# Patient Record
Sex: Male | Born: 1950 | Race: White | Hispanic: No | Marital: Married | Smoking: Former smoker
Health system: Southern US, Community
[De-identification: ages and names within clinical notes are randomized; demographics above are authoritative.]

## PROBLEM LIST (undated history)

## (undated) DIAGNOSIS — E785 Hyperlipidemia, unspecified: Secondary | ICD-10-CM

## (undated) DIAGNOSIS — I440 Atrioventricular block, first degree: Secondary | ICD-10-CM

## (undated) DIAGNOSIS — T7840XA Allergy, unspecified, initial encounter: Secondary | ICD-10-CM

## (undated) DIAGNOSIS — G4752 REM sleep behavior disorder: Secondary | ICD-10-CM

## (undated) DIAGNOSIS — F1910 Other psychoactive substance abuse, uncomplicated: Secondary | ICD-10-CM

## (undated) DIAGNOSIS — I10 Essential (primary) hypertension: Secondary | ICD-10-CM

## (undated) HISTORY — DX: REM sleep behavior disorder: G47.52

## (undated) HISTORY — DX: Hyperlipidemia, unspecified: E78.5

## (undated) HISTORY — DX: Other psychoactive substance abuse, uncomplicated: F19.10

## (undated) HISTORY — PX: VASECTOMY: SHX75

## (undated) HISTORY — DX: Allergy, unspecified, initial encounter: T78.40XA

## (undated) HISTORY — DX: Atrioventricular block, first degree: I44.0

## (undated) HISTORY — DX: Essential (primary) hypertension: I10

---

## 1998-05-17 ENCOUNTER — Encounter: Admission: RE | Admit: 1998-05-17 | Discharge: 1998-05-17 | Payer: Self-pay | Admitting: Sports Medicine

## 1999-11-21 ENCOUNTER — Encounter: Admission: RE | Admit: 1999-11-21 | Discharge: 1999-11-21 | Payer: Self-pay | Admitting: Family Medicine

## 1999-11-24 ENCOUNTER — Encounter: Admission: RE | Admit: 1999-11-24 | Discharge: 1999-11-24 | Payer: Self-pay | Admitting: Family Medicine

## 1999-12-26 ENCOUNTER — Ambulatory Visit (HOSPITAL_COMMUNITY): Admission: RE | Admit: 1999-12-26 | Discharge: 1999-12-26 | Payer: Self-pay | Admitting: Sports Medicine

## 2000-03-28 ENCOUNTER — Encounter: Admission: RE | Admit: 2000-03-28 | Discharge: 2000-03-28 | Payer: Self-pay | Admitting: Family Medicine

## 2000-05-29 ENCOUNTER — Encounter: Admission: RE | Admit: 2000-05-29 | Discharge: 2000-05-29 | Payer: Self-pay | Admitting: Sports Medicine

## 2000-07-30 ENCOUNTER — Encounter: Admission: RE | Admit: 2000-07-30 | Discharge: 2000-07-30 | Payer: Self-pay | Admitting: Family Medicine

## 2000-08-21 ENCOUNTER — Ambulatory Visit (HOSPITAL_COMMUNITY): Admission: RE | Admit: 2000-08-21 | Discharge: 2000-08-21 | Payer: Self-pay | Admitting: Otolaryngology

## 2000-08-21 ENCOUNTER — Encounter: Payer: Self-pay | Admitting: Otolaryngology

## 2000-12-24 ENCOUNTER — Encounter: Admission: RE | Admit: 2000-12-24 | Discharge: 2000-12-24 | Payer: Self-pay | Admitting: Sports Medicine

## 2001-01-29 ENCOUNTER — Encounter: Admission: RE | Admit: 2001-01-29 | Discharge: 2001-01-29 | Payer: Self-pay | Admitting: Sports Medicine

## 2001-06-20 ENCOUNTER — Encounter: Admission: RE | Admit: 2001-06-20 | Discharge: 2001-06-20 | Payer: Self-pay | Admitting: Family Medicine

## 2001-07-25 ENCOUNTER — Encounter: Admission: RE | Admit: 2001-07-25 | Discharge: 2001-07-25 | Payer: Self-pay | Admitting: Sports Medicine

## 2001-08-06 ENCOUNTER — Encounter: Admission: RE | Admit: 2001-08-06 | Discharge: 2001-08-06 | Payer: Self-pay | Admitting: Family Medicine

## 2002-01-08 ENCOUNTER — Ambulatory Visit (HOSPITAL_COMMUNITY): Admission: RE | Admit: 2002-01-08 | Discharge: 2002-01-08 | Payer: Self-pay | Admitting: Gastroenterology

## 2002-01-08 ENCOUNTER — Encounter (INDEPENDENT_AMBULATORY_CARE_PROVIDER_SITE_OTHER): Payer: Self-pay | Admitting: *Deleted

## 2002-02-04 ENCOUNTER — Encounter: Admission: RE | Admit: 2002-02-04 | Discharge: 2002-02-04 | Payer: Self-pay | Admitting: Family Medicine

## 2002-08-26 ENCOUNTER — Encounter: Admission: RE | Admit: 2002-08-26 | Discharge: 2002-08-26 | Payer: Self-pay | Admitting: Sports Medicine

## 2002-09-15 ENCOUNTER — Encounter: Admission: RE | Admit: 2002-09-15 | Discharge: 2002-09-15 | Payer: Self-pay | Admitting: Family Medicine

## 2003-03-10 ENCOUNTER — Encounter: Admission: RE | Admit: 2003-03-10 | Discharge: 2003-03-10 | Payer: Self-pay | Admitting: Family Medicine

## 2003-03-18 ENCOUNTER — Encounter: Payer: Self-pay | Admitting: Sports Medicine

## 2003-03-18 ENCOUNTER — Encounter: Admission: RE | Admit: 2003-03-18 | Discharge: 2003-03-18 | Payer: Self-pay | Admitting: Sports Medicine

## 2003-04-03 ENCOUNTER — Encounter: Admission: RE | Admit: 2003-04-03 | Discharge: 2003-04-03 | Payer: Self-pay | Admitting: Sports Medicine

## 2004-11-03 ENCOUNTER — Ambulatory Visit: Payer: Self-pay | Admitting: Sports Medicine

## 2004-12-20 ENCOUNTER — Ambulatory Visit (HOSPITAL_COMMUNITY): Admission: RE | Admit: 2004-12-20 | Discharge: 2004-12-20 | Payer: Self-pay | Admitting: Sports Medicine

## 2004-12-20 ENCOUNTER — Ambulatory Visit: Payer: Self-pay | Admitting: Sports Medicine

## 2005-03-31 ENCOUNTER — Ambulatory Visit: Payer: Self-pay | Admitting: Sports Medicine

## 2005-08-18 ENCOUNTER — Encounter: Admission: RE | Admit: 2005-08-18 | Discharge: 2005-08-18 | Payer: Self-pay | Admitting: Allergy and Immunology

## 2006-02-22 ENCOUNTER — Ambulatory Visit: Payer: Self-pay | Admitting: Sports Medicine

## 2006-09-06 ENCOUNTER — Ambulatory Visit: Payer: Self-pay | Admitting: Sports Medicine

## 2006-09-06 DIAGNOSIS — J45990 Exercise induced bronchospasm: Secondary | ICD-10-CM | POA: Insufficient documentation

## 2006-09-06 DIAGNOSIS — I10 Essential (primary) hypertension: Secondary | ICD-10-CM

## 2006-09-06 DIAGNOSIS — E785 Hyperlipidemia, unspecified: Secondary | ICD-10-CM

## 2006-09-06 DIAGNOSIS — D126 Benign neoplasm of colon, unspecified: Secondary | ICD-10-CM

## 2006-09-06 LAB — CONVERTED CEMR LAB: Cholesterol: 191 mg/dL (ref 0–200)

## 2006-09-09 ENCOUNTER — Ambulatory Visit: Payer: Self-pay | Admitting: Cardiology

## 2006-09-19 ENCOUNTER — Encounter: Payer: Self-pay | Admitting: Cardiology

## 2006-09-19 ENCOUNTER — Ambulatory Visit (HOSPITAL_COMMUNITY): Admission: RE | Admit: 2006-09-19 | Discharge: 2006-09-19 | Payer: Self-pay | Admitting: Sports Medicine

## 2006-11-29 ENCOUNTER — Ambulatory Visit: Payer: Self-pay | Admitting: Sports Medicine

## 2006-11-29 DIAGNOSIS — I517 Cardiomegaly: Secondary | ICD-10-CM | POA: Insufficient documentation

## 2006-12-10 ENCOUNTER — Telehealth: Payer: Self-pay | Admitting: Sports Medicine

## 2007-01-04 ENCOUNTER — Telehealth: Payer: Self-pay | Admitting: *Deleted

## 2007-01-08 ENCOUNTER — Encounter: Payer: Self-pay | Admitting: *Deleted

## 2007-03-07 ENCOUNTER — Encounter: Payer: Self-pay | Admitting: Sports Medicine

## 2007-12-06 ENCOUNTER — Encounter: Payer: Self-pay | Admitting: Sports Medicine

## 2008-01-30 ENCOUNTER — Ambulatory Visit: Payer: Self-pay | Admitting: Sports Medicine

## 2008-01-30 DIAGNOSIS — F528 Other sexual dysfunction not due to a substance or known physiological condition: Secondary | ICD-10-CM

## 2008-01-30 LAB — CONVERTED CEMR LAB
ALT: 23 units/L (ref 0–53)
AST: 18 units/L (ref 0–37)
Albumin: 4.6 g/dL (ref 3.5–5.2)
Alkaline Phosphatase: 87 units/L (ref 39–117)
BUN: 14 mg/dL (ref 6–23)
CO2: 24 meq/L (ref 19–32)
Calcium: 9.6 mg/dL (ref 8.4–10.5)
Chloride: 100 meq/L (ref 96–112)
Cholesterol: 200 mg/dL (ref 0–200)
Creatinine, Ser: 0.99 mg/dL (ref 0.40–1.50)
Glucose, Bld: 96 mg/dL (ref 70–99)
HDL: 70 mg/dL (ref >39)
LDL Cholesterol: 114 mg/dL — ABNORMAL HIGH (ref 0–99)
PSA: 0.75 ng/mL (ref 0.10–4.00)
Potassium: 4.2 meq/L (ref 3.5–5.3)
Sodium: 136 meq/L (ref 135–145)
Total Bilirubin: 0.4 mg/dL (ref 0.3–1.2)
Total CHOL/HDL Ratio: 2.9
Total Protein: 7.1 g/dL (ref 6.0–8.3)
Triglycerides: 81 mg/dL (ref <150)
VLDL: 16 mg/dL (ref 0–40)

## 2008-02-03 ENCOUNTER — Telehealth: Payer: Self-pay | Admitting: *Deleted

## 2008-02-27 ENCOUNTER — Ambulatory Visit: Payer: Self-pay | Admitting: Sports Medicine

## 2008-02-27 DIAGNOSIS — D485 Neoplasm of uncertain behavior of skin: Secondary | ICD-10-CM

## 2008-03-05 ENCOUNTER — Encounter (INDEPENDENT_AMBULATORY_CARE_PROVIDER_SITE_OTHER): Payer: Self-pay | Admitting: *Deleted

## 2008-06-26 ENCOUNTER — Ambulatory Visit: Payer: Self-pay | Admitting: Sports Medicine

## 2008-06-26 DIAGNOSIS — G44209 Tension-type headache, unspecified, not intractable: Secondary | ICD-10-CM | POA: Insufficient documentation

## 2008-06-26 DIAGNOSIS — K219 Gastro-esophageal reflux disease without esophagitis: Secondary | ICD-10-CM

## 2009-02-01 ENCOUNTER — Ambulatory Visit: Payer: Self-pay | Admitting: Sports Medicine

## 2009-02-01 DIAGNOSIS — M542 Cervicalgia: Secondary | ICD-10-CM

## 2009-02-01 DIAGNOSIS — S86819A Strain of other muscle(s) and tendon(s) at lower leg level, unspecified leg, initial encounter: Secondary | ICD-10-CM

## 2009-02-01 DIAGNOSIS — S838X9A Sprain of other specified parts of unspecified knee, initial encounter: Secondary | ICD-10-CM | POA: Insufficient documentation

## 2009-06-14 ENCOUNTER — Ambulatory Visit: Payer: Self-pay | Admitting: Family Medicine

## 2009-06-14 ENCOUNTER — Ambulatory Visit: Payer: Self-pay | Admitting: Sports Medicine

## 2009-06-14 LAB — CONVERTED CEMR LAB
ALT: 31 units/L (ref 0–53)
AST: 25 units/L (ref 0–37)
Albumin: 4.3 g/dL (ref 3.5–5.2)
Alkaline Phosphatase: 62 units/L (ref 39–117)
BUN: 19 mg/dL (ref 6–23)
CO2: 25 meq/L (ref 19–32)
Calcium: 9.2 mg/dL (ref 8.4–10.5)
Chloride: 105 meq/L (ref 96–112)
Cholesterol: 146 mg/dL (ref 0–200)
Creatinine, Ser: 1.03 mg/dL (ref 0.40–1.50)
Glucose, Bld: 98 mg/dL (ref 70–99)
HDL: 54 mg/dL (ref >39)
LDL Cholesterol: 82 mg/dL (ref 0–99)
PSA: 0.88 ng/mL (ref 0.10–4.00)
Potassium: 4.3 meq/L (ref 3.5–5.3)
Sodium: 142 meq/L (ref 135–145)
Total Bilirubin: 0.6 mg/dL (ref 0.3–1.2)
Total CHOL/HDL Ratio: 2.7
Total Protein: 6.7 g/dL (ref 6.0–8.3)
Triglycerides: 49 mg/dL (ref <150)
VLDL: 10 mg/dL (ref 0–40)

## 2010-06-08 ENCOUNTER — Encounter (INDEPENDENT_AMBULATORY_CARE_PROVIDER_SITE_OTHER): Payer: Self-pay | Admitting: *Deleted

## 2010-06-27 ENCOUNTER — Encounter: Payer: Self-pay | Admitting: Sports Medicine

## 2010-06-27 ENCOUNTER — Ambulatory Visit: Payer: Self-pay | Admitting: Family Medicine

## 2010-06-27 LAB — CONVERTED CEMR LAB
ALT: 34 units/L (ref 0–53)
AST: 26 units/L (ref 0–37)
Albumin: 4.3 g/dL (ref 3.5–5.2)
Alkaline Phosphatase: 72 units/L (ref 39–117)
BUN: 21 mg/dL (ref 6–23)
CO2: 25 meq/L (ref 19–32)
Calcium: 9.6 mg/dL (ref 8.4–10.5)
Chloride: 103 meq/L (ref 96–112)
Cholesterol: 166 mg/dL (ref 0–200)
Creatinine, Ser: 1.1 mg/dL (ref 0.40–1.50)
Glucose, Bld: 115 mg/dL — ABNORMAL HIGH (ref 70–99)
HCT: 43.1 % (ref 39.0–52.0)
HDL: 47 mg/dL (ref >39)
Hemoglobin: 14.7 g/dL (ref 13.0–17.0)
LDL Cholesterol: 105 mg/dL — ABNORMAL HIGH (ref 0–99)
MCHC: 34.1 g/dL (ref 30.0–36.0)
MCV: 88.3 fL (ref 78.0–100.0)
PSA: 1.18 ng/mL (ref <=4.00)
Platelets: 254 10*3/uL (ref 150–400)
Potassium: 4.1 meq/L (ref 3.5–5.3)
RBC: 4.88 M/uL (ref 4.22–5.81)
RDW: 12.9 % (ref 11.5–15.5)
Sodium: 139 meq/L (ref 135–145)
Total Bilirubin: 0.6 mg/dL (ref 0.3–1.2)
Total CHOL/HDL Ratio: 3.5
Total Protein: 6.3 g/dL (ref 6.0–8.3)
Triglycerides: 71 mg/dL (ref <150)
VLDL: 14 mg/dL (ref 0–40)
WBC: 6.5 10*3/uL (ref 4.0–10.5)

## 2010-06-29 ENCOUNTER — Encounter (INDEPENDENT_AMBULATORY_CARE_PROVIDER_SITE_OTHER): Payer: Self-pay | Admitting: *Deleted

## 2010-06-30 ENCOUNTER — Ambulatory Visit: Payer: Self-pay | Admitting: Sports Medicine

## 2010-06-30 ENCOUNTER — Ambulatory Visit: Payer: Self-pay | Admitting: Family Medicine

## 2010-06-30 DIAGNOSIS — R7303 Prediabetes: Secondary | ICD-10-CM | POA: Insufficient documentation

## 2010-06-30 DIAGNOSIS — R7301 Impaired fasting glucose: Secondary | ICD-10-CM

## 2010-08-08 ENCOUNTER — Encounter (INDEPENDENT_AMBULATORY_CARE_PROVIDER_SITE_OTHER): Payer: Self-pay | Admitting: *Deleted

## 2010-08-10 DIAGNOSIS — E7800 Pure hypercholesterolemia, unspecified: Secondary | ICD-10-CM | POA: Insufficient documentation

## 2010-08-10 DIAGNOSIS — F1120 Opioid dependence, uncomplicated: Secondary | ICD-10-CM | POA: Insufficient documentation

## 2010-08-11 NOTE — Miscellaneous (Signed)
Summary: LABS FOR CPE IN DEC  Clinical Lists Changes  Orders: Added new Test order of Miscellaneous Lab Charge-[facility] [phone]) - Signed

## 2010-08-11 NOTE — Letter (Signed)
Summary: New Patient letter  [facility]  [address] [place], [place] [address]   Phone: [phone]  Fax: [phone]       06/29/2010 MRN: [id number]  Clarence Ware [address] [place], [place]  [address]  Dear Mr. NOLL,  Welcome to the Gastroenterology Division at [facility]    You are scheduled to see Dr.  Jarold Motto on 08-09-10 at 2:00p.m. on the 3rd floor at [facility], [address]  We ask that you try to arrive at our office 15 minutes prior to your appointment time to allow for check-in.  We would like you to complete the enclosed self-administered evaluation form prior to your visit and bring it with you on the day of your appointment.  We will review it with you.  Also, please bring a complete list of all your medications or, if you prefer, bring the medication bottles and we will list them.  Please bring your insurance card so that we may make a copy of it.  If your insurance requires a referral to see a specialist, please bring your referral form from your primary care physician.  Co-payments are due at the time of your visit and may be paid by cash, check or credit card.     Your office visit will consist of a consult with your physician (includes a physical exam), any laboratory testing he/she may order, scheduling of any necessary diagnostic testing (e.g. x-ray, ultrasound, CT-scan), and scheduling of a procedure (e.g. Endoscopy, Colonoscopy) if required.  Please allow enough time on your schedule to allow for any/all of these possibilities.    If you cannot keep your appointment, please call [phone] to cancel or reschedule prior to your appointment date.  This allows [place] the opportunity to schedule an appointment for another patient in need of care.  If you do not cancel or reschedule by 5 p.m. the business day prior to your appointment date, you will be charged a $50.00 late cancellation/no-show fee.    Thank you for  choosing [facility] for your medical needs.  We appreciate the opportunity to care for you.  Please visit [place] at our website  to learn more about our practice.                     Sincerely,                                                             The Gastroenterology Division

## 2010-08-11 NOTE — Assessment & Plan Note (Signed)
Summary: CPE,MC   Vital Signs:  Patient profile:   60 year old male Height:      75 inches Weight:      212 pounds BMI:     26.59 BP sitting:   126 / 86  Vitals Entered By: Lillia Pauls CMA (June 30, 2010 12:34 PM)   History of Present Illness: Substance abuse program goes to group twice monthly goes for 2 urine tests per month charges seved and can do probation program before license  HBP has been stable w systolics in 130 range/ diastolics 84 stable with metoprolol and HCTZ as noted - no real side effects  Cholesterol still on simvistatin 80 some muscle aches and fatigue - not sure if related  asa one once daily  cialis 20 mgm - this works for ED  Preventive Screening-Counseling & Management  Alcohol-Tobacco     Smoking Status: never  Allergies (verified): No Known Drug Allergies  Past History:  Past Medical History: 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 Josefa Half  mild diverticulosis - 01/26/2002 -no medical flares since then   ETT 2001 12 minutes of bruce - 11/03/2004, ett 98 12 mins of bruce protocal    vasectomy 1988  Family History: brother CABG 2001 doing OK was obese Ron   brother died with neuroblastoma at 60 -Annette Stable   Brother  twin brother Thayer Ohm Brother with HBP and chol/ AODM age 70 - doing better and has lost weight had dissecting aorta   male sib 73 and healthy   father died MI age 17  mother died age 28 MI died with that  Social History: pharmacist in town   lives with wife   2 sons Jonny Ruiz at [employer]  rob at [employer] works [employer] as well   non smoker;  formerly drank but no ETOH or illegal drugs in 2 years  - sobriety date 1//09/2008  Review of Systems  The patient denies vision loss,  hoarseness, syncope, dyspnea on exertion, abdominal pain, and melena.         Occ central chest tightenss - thought 2/2 anxiety is brief irregular bowel fxn - 3 x per week  Physical Exam  General:  Well-developed,well-nourished,in no acute distress; alert,appropriate and cooperative throughout examination Head:  Normocephalic and atraumatic without obvious abnormalities. No apparent alopecia or balding. Eyes:  vitreous line RT small floater left nl retina Ears:  External ear exam shows no significant lesions or deformities.  Otoscopic examination reveals clear canals, tympanic membranes are intact bilaterally without bulging, retraction, inflammation or discharge. Hearing is grossly normal bilaterally. Nose:  External nasal examination shows no deformity or inflammation. Nasal mucosa are pink and moist without lesions or exudates. Mouth:  Oral mucosa and oropharynx without lesions or exudates.  Teeth in good repair. Neck:  No deformities, masses, or tenderness noted. Chest Wall:  No deformities, masses, tenderness or gynecomastia noted. Lungs:  Normal respiratory effort, chest expands symmetrically. Lungs are clear to auscultation, no crackles or wheezes. Heart:  Normal rate and regular rhythm. S1 and S2 normal without gallop, murmur, click, rub or other extra sounds. Abdomen:  Bowel sounds positive,abdomen soft and non-tender without masses, organomegaly or hernias noted. Rectal:  No external abnormalities noted. Normal sphincter tone. No rectal  masses or tenderness.  guiac neg Prostate:  no gland enlargement.  med and lat lobes smooth base is slightly nodular on palpation but no discreet nodule noted Msk:  RT knee shows some mild chronic DJD change medial compt with mild spurring good flexion lacks 2 to 3 deg of full extension  left knee wnl  bilat shoulders norm elbows normal ankles normal Pulses:  R and L carotid,radial,femoral,dorsalis pedis and posterior tibial pulses are  full and equal bilaterally Extremities:  No clubbing, cyanosis, edema, or deformity noted with normal full range of motion of all joints.   Neurologic:  alert & oriented X3 and cranial nerves II-XII intact.   Skin:  Intact without suspicious lesions or rashes Cervical Nodes:  No lymphadenopathy noted Axillary Nodes:  No palpable lymphadenopathy Inguinal Nodes:  No significant adenopathy Psych:  memory intact for recent and remote and normally interactive.     Impression & Recommendations:  Problem # 1:  HEALTH SCREENING (ICD-V70.0) The physical evaluation today looks good  HTN is stable on meds Hyperlipidemia is well contorlled - although we want to swtch him off the 80 of simvistatin to lipotor 20 generic suggested eye  exam particularly if any  sxs occur colon polyps needs colonsocopy q 5 yrs  Very strong fam hx of CVD and MI - now 5 yrs and I would like to repeat ETT this year  Problem # 2:  IMPAIRED FASTING GLUCOSE (ICD-790.21) 2 levels in borderline range  suggested dietary change  we will reck FBG and A1C after diet change for 2 to 3 mos  also will set him up with regular medical care at [facility]  Complete Medication List: 1)  Cialis 5 Mg Tabs (Tadalafil) .Marland Kitchen.. 1 by mouth as directed 2)  Metoprolol Tartrate 50 Mg Tabs (Metoprolol tartrate) .... 1/ 2 tab by mouth once daily 3)  Hydrochlorothiazide 25 Mg Tabs (Hydrochlorothiazide) .... 1/2 tab by mouth once daily 4)  Cvs Aspirin Ec 325 Mg Tbec (Aspirin) .... One qd 5)  Lamisil Advanced 1 % Gel (Terbinafine) .... Apply daily to toes as directed disp: 80g 6)  Lipitor 20 Mg Tabs (Atorvastatin calcium) .... Use 1 by mouth qhs  Patient Instructions: 1)  Encourage 5 fruit servings per day and some gatorade w goal of getting to at leat 5 BMs per week 2)  vegetables - more leafy, greens, etc are better 3)  DASH - review some of this and see if this fits your diets Prescriptions: LIPITOR 20 MG TABS (ATORVASTATIN CALCIUM) use 1 by  mouth qhs  #30 x 11   Entered and Authorized by:   Enid Baas MD   Signed by:   Enid Baas MD on 06/30/2010   Method used:   Electronically to        [facility] (retail)       [address]       [place], [place]  [address]       Ph: [phone]       Fax: [phone]   RxID:   [id number]    Orders Added: 1)  Est. Patient 40-64 years [id number]

## 2010-08-17 NOTE — Miscellaneous (Signed)
  Clinical Lists Changes  Medications: Added new medication of LIPITOR 40 MG TABS (ATORVASTATIN CALCIUM) take 1 by mouth at bedtime - Signed Rx of LIPITOR 40 MG TABS (ATORVASTATIN CALCIUM) take 1 by mouth at bedtime;  #30 x 6;  Signed;  Entered by: Rochele Pages RN;  Authorized by: Enid Baas MD;  Method used: Electronically to [facility] [address] [place], [place], [place]  [address], Ph: [phone], Fax: [phone]    Prescriptions: LIPITOR 40 MG TABS (ATORVASTATIN CALCIUM) take 1 by mouth at bedtime  #30 x 6   Entered by:   Rochele Pages RN   Authorized by:   Enid Baas MD   Signed by:   Rochele Pages RN on 08/08/2010   Method used:   Electronically to        [facility] (retail)       [address]       [place], [place]  [address]       Ph: [phone]       Fax: [phone]   RxID:   [id number]  Pt requested 40 mg tabs so he could cut in half.  Ok per Dr. Darrick Penna.  Advised pt to make sure to cut tab in half even though instructions say take 1 at bedtime. Rochele Pages RN  August 08, 2010 9:23 AM

## 2010-09-19 LAB — GLUCOSE, CAPILLARY: Glucose-Capillary: 114 mg/dL — ABNORMAL HIGH (ref 70–99)

## 2010-11-25 NOTE — Procedures (Signed)
[facility]  Patient:    Clarence Ware, Clarence Ware Visit Number: [id number] MRN: [id number]          Service Type: END Location: ENDO Attending Physician:  Rich Brave Dictated by:   Florencia Reasons, M.D. Proc. Date: 01/08/02 Admit Date:  01/08/2002   CC:         Royal Hawthorn B. Darrick Penna, M.D.   Procedure Report  PROCEDURE:  Colonoscopy with polypectomy and not biopsy.  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  FINDINGS:  Several polyps removed, largest approximately 18 mm.  DESCRIPTION OF PROCEDURE:  The nature, purpose, and risks of the procedure had been carefully discussed with the patient, who provided written consent. Sedation was fentanyl 100 mcg and Versed 10 mg IV prior to and during the course of this rather lengthy procedure.  Digital exam of the prostate was normal.  The Olympus adjustable-tension pediatric video colonoscope was advanced with some looping, overcome by external abdominal compression and having the patient in the supine position, to the cecum.  There were quite a few sharp angulations and flexures in the proximal colon, making it appear that we had reached the cecum prior to our actually doing so.  Once in the cecum, pullback was performed.  In what I believe was the transverse colon, there was an 18 mm pedunculated polyp removed by snare technique.  There was some initial oozing at the stalk, so the stalk remnant was regrasped with the polypectomy snare and cinched up for half a minute or so to help achieve complete hemostasis.  The estimated blood loss was perhaps 1 or 2 cc.  After holding the stalk for half a minute or so, we loosened the snare, came up higher on the stalk remnant, and very gradually snared off a small piece, although I do not think that was ever retrieved for histologic  analysis.  The main part of the polyp head was withdrawn along with the scope out through the anus and sent for histologic analysis.  We then rescoped the patient up to the site of the polypectomy and injected approximately 2 cc of 1:10,000 epinephrine with good bleb formation and good blanching, to help ensure hemostasis.  Pullback was then performed.  The patient had a 5 mm sessile polyp removed by snare technique at about 35 cm and a couple of sessile hyperplastic-appearing 2-3 mm polyps in the rectum, one of which was hot biopsied, and another of which was cold biopsied a couple of times.  Retroflexion in the rectum prior to the polypectomies was unremarkable.  There was some mild sigmoid diverticulosis.  No cancer, colitis, or vascular malformations were observed.  The quality of the prep was quite good, so it was felt that all areas were well-seen.  IMPRESSION: 1. Several colon polyps removed as described above.  Transient small-volume    bleeding with one of the polypectomies, treated as described above. 2. Mild sigmoid diverticulosis.  PLAN:  Await pathology.  Postpolypectomy instructions, specifically aspirin avoidance and instruction to call us in the event of rectal bleeding. Dictated by:   Florencia Reasons, M.D. Attending Physician:  Rich Brave DD:  01/08/02 TD:  01/10/02 Job: [id number]

## 2010-12-04 ENCOUNTER — Other Ambulatory Visit: Payer: Self-pay | Admitting: Sports Medicine

## 2011-01-31 ENCOUNTER — Other Ambulatory Visit: Payer: Self-pay | Admitting: Sports Medicine

## 2011-06-22 ENCOUNTER — Ambulatory Visit: Payer: Self-pay

## 2011-06-26 ENCOUNTER — Ambulatory Visit: Payer: Self-pay

## 2011-07-21 ENCOUNTER — Ambulatory Visit: Payer: Self-pay

## 2011-09-24 ENCOUNTER — Other Ambulatory Visit: Payer: Self-pay | Admitting: Sports Medicine

## 2011-09-25 ENCOUNTER — Other Ambulatory Visit: Payer: Self-pay | Admitting: *Deleted

## 2011-09-25 MED ORDER — TADALAFIL 20 MG PO TABS: ORAL_TABLET | ORAL | Status: DC

## 2011-10-03 ENCOUNTER — Ambulatory Visit (INDEPENDENT_AMBULATORY_CARE_PROVIDER_SITE_OTHER): Payer: BC Managed Care – PPO | Admitting: Emergency Medicine

## 2011-10-03 VITALS — BP 128/88 | HR 61 | Temp 97.6°F | Resp 16 | Ht 75.0 in | Wt 220.4 lb

## 2011-10-03 DIAGNOSIS — I10 Essential (primary) hypertension: Secondary | ICD-10-CM

## 2011-10-03 DIAGNOSIS — Z0000 Encounter for general adult medical examination without abnormal findings: Secondary | ICD-10-CM

## 2011-10-03 DIAGNOSIS — H539 Unspecified visual disturbance: Secondary | ICD-10-CM

## 2011-10-03 DIAGNOSIS — N529 Male erectile dysfunction, unspecified: Secondary | ICD-10-CM

## 2011-10-03 DIAGNOSIS — E782 Mixed hyperlipidemia: Secondary | ICD-10-CM

## 2011-10-03 DIAGNOSIS — E785 Hyperlipidemia, unspecified: Secondary | ICD-10-CM

## 2011-10-03 LAB — CBC
Platelets: 218 10*3/uL (ref 150–400)
RBC: 5.23 MIL/uL (ref 4.22–5.81)
RDW: 12.6 % (ref 11.5–15.5)
WBC: 7.9 10*3/uL (ref 4.0–10.5)

## 2011-10-03 LAB — COMPREHENSIVE METABOLIC PANEL
AST: 25 U/L (ref 0–37)
Alkaline Phosphatase: 76 U/L (ref 39–117)
BUN: 17 mg/dL (ref 6–23)
Creat: 0.98 mg/dL (ref 0.50–1.35)
Total Bilirubin: 0.7 mg/dL (ref 0.3–1.2)

## 2011-10-03 LAB — POCT URINALYSIS DIPSTICK
Protein, UA: NEGATIVE
Spec Grav, UA: 1.015
Urobilinogen, UA: 0.2

## 2011-10-03 LAB — POCT UA - MICROSCOPIC ONLY
Bacteria, U Microscopic: NEGATIVE
Casts, Ur, LPF, POC: NEGATIVE
Crystals, Ur, HPF, POC: NEGATIVE

## 2011-10-03 LAB — LIPID PANEL
Cholesterol: 165 mg/dL (ref 0–200)
Total CHOL/HDL Ratio: 3.8 Ratio
Triglycerides: 70 mg/dL (ref <150)
VLDL: 14 mg/dL (ref 0–40)

## 2011-10-03 LAB — IFOBT (OCCULT BLOOD): IFOBT: NEGATIVE

## 2011-10-03 MED ORDER — HYDROCHLOROTHIAZIDE 25 MG PO TABS: 25.0000 mg | ORAL_TABLET | Freq: Every morning | ORAL | Status: DC

## 2011-10-03 MED ORDER — ASPIRIN BUFFERED 325 MG PO TABS: 325.0000 mg | ORAL_TABLET | Freq: Every day | ORAL | Status: DC

## 2011-10-03 MED ORDER — ATORVASTATIN CALCIUM 40 MG PO TABS: 40.0000 mg | ORAL_TABLET | Freq: Once | ORAL | Status: DC

## 2011-10-03 MED ORDER — LORATADINE 10 MG PO TABS: 10.0000 mg | ORAL_TABLET | Freq: Every day | ORAL | Status: DC

## 2011-10-03 MED ORDER — RANITIDINE HCL 150 MG PO CAPS: 150.0000 mg | ORAL_CAPSULE | Freq: Two times a day (BID) | ORAL | Status: DC

## 2011-10-03 MED ORDER — IBUPROFEN 200 MG PO TABS: 200.0000 mg | ORAL_TABLET | Freq: Four times a day (QID) | ORAL | Status: AC | PRN

## 2011-10-03 MED ORDER — TETANUS-DIPHTH-ACELL PERTUSSIS 5-2.5-18.5 LF-MCG/0.5 IM SUSP: 0.5000 mL | Freq: Once | INTRAMUSCULAR | Status: DC

## 2011-10-03 MED ORDER — TADALAFIL 20 MG PO TABS: ORAL_TABLET | ORAL | Status: DC

## 2011-10-03 MED ORDER — METOPROLOL TARTRATE 50 MG PO TABS: ORAL_TABLET | ORAL | Status: DC

## 2011-10-03 NOTE — Progress Notes (Signed)
Subjective:    Patient ID: Clarence Ware, male    DOB: 12/30/1950, 61 y.o.   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 His only ongoing problems or high blood pressure high cholesterol. He is a long-standing friend of mine and approximately 3 years ago head is license revoked for his substance abuse issue. He goes to regular meetings and gets regular counseling for this problem. He has drug testing done one to 2 times a month and has for the last 3 years. He is trying to get his license back and is waiting for a hearing for reinstatement. He overall is staying active he now has a regular job.    Review of Systems  Constitutional: Negative.   HENT: Negative.   Eyes:       Recently he has had some diminished vision in his right eye. In fact this has been going on for about 8 months. He just assumed he was developing a cataract.  Respiratory: Negative.   Cardiovascular:       He is asymptomatic from a cardiac standpoint but does have a bad family history of heart disease and has had intermittent stress test but none for the last 3 to 5 years.  Genitourinary:       He does have the ED issues and takes Cialis for this  Musculoskeletal: Negative.   Skin:       Has 2 warts on the right side of his face he would like frozen today. He's also had some nail issues but is not willing to take Lamisil for this.  Neurological: Negative.   Hematological: Negative.   Psychiatric/Behavioral: Negative.        Objective:   Physical Exam  Constitutional: He appears well-developed and well-nourished.  HENT:  Head: Normocephalic and atraumatic.  Right Ear: External ear normal.  Left Ear: External ear normal.  Eyes: Pupils are equal, round, and reactive to light.       The left disc appeared normal the rest disc I could not get a good visualization. It did not appear that he had a cataract. I was concerned that there was some possible  clouding  of the vitreous.  Cardiovascular: Normal rate, regular rhythm and normal heart sounds.   Pulmonary/Chest: Effort normal. No respiratory distress. He has no wheezes. He has no rales.  Abdominal: He exhibits no distension and no mass. There is no tenderness. There is no rebound and no guarding.  Genitourinary: Prostate normal.  Musculoskeletal: Normal range of motion.  Neurological: He is alert. No cranial nerve deficit. Coordination normal.  Skin: Skin is warm and dry.  Psychiatric: He has a normal mood and affect. His behavior is normal.   The 2 areas on his right cheek were treated with liquid nitrogen for about 6-8 seconds x2 each.       Assessment & Plan:   Patient has a couple issues that need to be addressed. #1 he has had a decreased visual acuity in the right eye which needs to see the ophthalmologist regarding this. He is past due for colonoscopy and will check on his insurance reimbursement for this. The last problem it is that he has not had a recent stress test and has a bad family history of coronary disease. He was advised he needs a stress Cardiolite and will let me know when he is willing to have this done. I am concerned about the vision is right I am  not sure what this represents. I very much encouraged him to let me know and we'll get him in to see the ophthalmologist

## 2011-10-05 ENCOUNTER — Encounter: Payer: Self-pay | Admitting: Radiology

## 2011-10-31 ENCOUNTER — Encounter: Payer: Self-pay | Admitting: Emergency Medicine

## 2012-09-24 ENCOUNTER — Encounter: Payer: Self-pay | Admitting: Emergency Medicine

## 2012-09-24 ENCOUNTER — Ambulatory Visit (INDEPENDENT_AMBULATORY_CARE_PROVIDER_SITE_OTHER): Payer: BC Managed Care – PPO | Admitting: Emergency Medicine

## 2012-09-24 VITALS — BP 110/82 | HR 80 | Temp 98.4°F | Resp 18 | Wt 220.0 lb

## 2012-09-24 DIAGNOSIS — K4090 Unilateral inguinal hernia, without obstruction or gangrene, not specified as recurrent: Secondary | ICD-10-CM

## 2012-09-24 LAB — POCT CBC
Granulocyte percent: 67.1 %G (ref 37–80)
HCT, POC: 47.8 % (ref 43.5–53.7)
Hemoglobin: 15.8 g/dL (ref 14.1–18.1)
MCH, POC: 30.3 pg (ref 27–31.2)
MCV: 91.7 fL (ref 80–97)
Platelet Count, POC: 236 10*3/uL (ref 142–424)
RBC: 5.21 M/uL (ref 4.69–6.13)
WBC: 8.5 10*3/uL (ref 4.6–10.2)

## 2012-09-24 LAB — POCT URINALYSIS DIPSTICK
Blood, UA: NEGATIVE
Leukocytes, UA: NEGATIVE
Nitrite, UA: NEGATIVE
Protein, UA: NEGATIVE
pH, UA: 6.5

## 2012-09-24 LAB — POCT UA - MICROSCOPIC ONLY
Bacteria, U Microscopic: NEGATIVE
Epithelial cells, urine per micros: NEGATIVE

## 2012-09-24 LAB — LIPID PANEL
Cholesterol: 150 mg/dL (ref 0–200)
VLDL: 20 mg/dL (ref 0–40)

## 2012-09-24 LAB — COMPREHENSIVE METABOLIC PANEL
Albumin: 4.7 g/dL (ref 3.5–5.2)
Alkaline Phosphatase: 86 U/L (ref 39–117)
Total Protein: 7 g/dL (ref 6.0–8.3)

## 2012-09-24 MED ORDER — METOPROLOL TARTRATE 50 MG PO TABS: ORAL_TABLET | ORAL | Status: DC

## 2012-09-24 MED ORDER — ATORVASTATIN CALCIUM 40 MG PO TABS: 40.0000 mg | ORAL_TABLET | Freq: Once | ORAL | Status: DC

## 2012-09-24 MED ORDER — HYDROCHLOROTHIAZIDE 25 MG PO TABS: 12.5000 mg | ORAL_TABLET | Freq: Every morning | ORAL | Status: DC

## 2012-09-24 MED ORDER — IBUPROFEN 200 MG PO TABS: 200.0000 mg | ORAL_TABLET | Freq: Four times a day (QID) | ORAL | Status: DC | PRN

## 2012-09-24 MED ORDER — ASPIRIN BUFFERED 325 MG PO TABS: 325.0000 mg | ORAL_TABLET | Freq: Every day | ORAL | Status: AC

## 2012-09-24 MED ORDER — TADALAFIL 20 MG PO TABS: ORAL_TABLET | ORAL | Status: DC

## 2012-09-24 NOTE — Progress Notes (Signed)
@[facility] @  Patient ID: Clarence Ware MRN: [id number], DOB: 1951/03/28 62 y.o. Date of Encounter: 09/24/2012, 5:10 PM  Primary Physician: Enid Baas, MD  Chief Complaint: Physical (CPE)  HPI: 62 y.o. y/o male with history noted below here for CPE.  Doing well. No issues/complaints.  Review of Systems: Consitutional: No fever, chills, fatigue, night sweats, lymphadenopathy, or weight changes. Eyes: No visual changes, eye redness, or discharge. ENT/Mouth: Ears: No otalgia, tinnitus, hearing loss, discharge. Nose: No congestion, rhinorrhea, sinus pain, or epistaxis. Throat: No sore throat, post nasal drip, or teeth pain. Cardiovascular: No CP, palpitations, diaphoresis, DOE, edema, orthopnea, PND. Respiratory: No cough, hemoptysis, SOB, or wheezing. Gastrointestinal: No anorexia, dysphagia, reflux, pain, nausea, vomiting, hematemesis, diarrhea, constipation, BRBPR, or melena. Genitourinary: No dysuria, frequency, urgency, hematuria, incontinence, nocturia, decreased urinary stream, discharge, impotence, or testicular pain/masses. Musculoskeletal: No decreased ROM, myalgias, stiffness, joint swelling, or weakness. He has had some pain in his right knee and plans to see Dr. fears regarding this. Skin: No rash, erythema, lesion changes, pain, warmth, jaundice, or pruritis. Neurological: No headache, dizziness, syncope, seizures, tremors, memory loss, coordination problems, or paresthesias. Psychological: No anxiety, depression, hallucinations, SI/HI. Endocrine: No fatigue, polydipsia, polyphagia, polyuria, or known diabetes. All other systems were reviewed and are otherwise negative.  Past Medical History  Diagnosis Date  . Allergy   . Substance abuse   . Hyperlipidemia   . Hypertension      Past Surgical History  Procedure Laterality Date  . Vasectomy      Home Meds:  Prior to Admission medications   Medication Sig Start Date End Date Taking? Authorizing Provider  aspirin  325 MG buffered tablet Take 1 tablet (325 mg total) by mouth daily. 10/03/11 10/02/12 Yes Collene Gobble, MD  atorvastatin (LIPITOR) 40 MG tablet Take 1 tablet (40 mg total) by mouth once. 10/03/11  Yes Collene Gobble, MD  hydrochlorothiazide (HYDRODIURIL) 25 MG tablet Take 12.5 mg by mouth every morning. 10/03/11  Yes Collene Gobble, MD  loratadine (CLARITIN) 10 MG tablet Take 1 tablet (10 mg total) by mouth daily. 10/03/11  Yes Collene Gobble, MD  metoprolol (LOPRESSOR) 50 MG tablet Take one half tablet daily 10/03/11  Yes Collene Gobble, MD  tadalafil (CIALIS) 20 MG tablet Use as directed. 10/03/11  Yes Collene Gobble, MD    Allergies: No Known Allergies  History   Social History  . Marital Status: Married    Spouse Name: N/A    Number of Children: N/A  . Years of Education: N/A   Occupational History  . Not on file.   Social History Main Topics  . Smoking status: Former Games developer  . Smokeless tobacco: Not on file  . Alcohol Use: No  . Drug Use: No  . Sexually Active: Yes   Other Topics Concern  . Not on file   Social History Narrative  . No narrative on file    Family History  Problem Relation Age of Onset  . Heart disease Mother   . Heart disease Father   . Hypertension Brother   . Diabetes Brother   . Cancer Brother     Physical Exam:  Blood pressure 110/82, pulse 80, temperature 98.4 F (36.9 C), temperature source Oral, resp. rate 18, weight 220 lb (99.791 kg).  General: Well developed, well nourished, in no acute distress. HEENT: Normocephalic, atraumatic. Conjunctiva pink, sclera non-icteric. Pupils 2 mm constricting to 1 mm, round, regular, and equally reactive to light and accomodation. EOMI.  Internal auditory canal clear. TMs with good cone of light and without pathology. Nasal mucosa pink. Nares are without discharge. No sinus tenderness. Oral mucosa pink. Dentition . Pharynx without exudate.   Neck: Supple. Trachea midline. No thyromegaly. Full ROM. No  lymphadenopathy. Lungs: Clear to auscultation bilaterally without wheezes, rales, or rhonchi. Breathing is of normal effort and unlabored. Cardiovascular: RRR with S1 S2. No murmurs, rubs, or gallops appreciated. Distal pulses 2+ symmetrically. No carotid or abdominal bruits.  Abdomen: Soft, non-tender, non-distended with normoactive bowel sounds. No hepatosplenomegaly or masses. No rebound/guarding. No CVA tenderness. Without hernias.  Rectal: No external hemorrhoids or fissures. Rectal vault without masses.   Genitourinary:   circumcised male. No penile lesions. Testes descended bilaterally, and smooth without tenderness or masses.  Musculoskeletal: Full range of motion and 5/5 strength throughout. Without swelling, atrophy, tenderness, crepitus, or warmth. Extremities without clubbing, cyanosis, or edema. Calves supple. Skin: Warm and moist without erythema, ecchymosis, wounds, or rash. Neuro: A+Ox3. CN II-XII grossly intact. Moves all extremities spontaneously. Full sensation throughout. Normal gait. DTR 2+ throughout upper and lower extremities. Finger to nose intact. Psych:  Responds to questions appropriately with a normal affect.   Results for orders placed in visit on 09/24/12  POCT CBC      Result Value Range   WBC 8.5  4.6 - 10.2 K/uL   Lymph, poc 2.1  0.6 - 3.4   POC LYMPH PERCENT 24.2  10 - 50 %L   MID (cbc) 0.7  0 - 0.9   POC MID % 8.7  0 - 12 %M   POC Granulocyte 5.7  2 - 6.9   Granulocyte percent 67.1  37 - 80 %G   RBC 5.21  4.69 - 6.13 M/uL   Hemoglobin 15.8  14.1 - 18.1 g/dL   HCT, POC 16.1  09.6 - 53.7 %   MCV 91.7  80 - 97 fL   MCH, POC 30.3  27 - 31.2 pg   MCHC 33.0  31.8 - 35.4 g/dL   RDW, POC 04.5     Platelet Count, POC 236  142 - 424 K/uL   MPV 8.1  0 - 99.8 fL  IFOBT (OCCULT BLOOD)      Result Value Range   IFOBT Negative    POCT UA - MICROSCOPIC ONLY      Result Value Range   WBC, Ur, HPF, POC 0-1     RBC, urine, microscopic 0-2     Bacteria, U  Microscopic neg     Mucus, UA neg     Epithelial cells, urine per micros neg     Crystals, Ur, HPF, POC neg     Casts, Ur, LPF, POC neg     Yeast, UA neg    POCT URINALYSIS DIPSTICK      Result Value Range   Color, UA yellow     Clarity, UA clear     Glucose, UA neg     Bilirubin, UA neg     Ketones, UA neg     Spec Grav, UA 1.020     Blood, UA neg     pH, UA 6.5     Protein, UA neg     Urobilinogen, UA 0.2     Nitrite, UA neg     Leukocytes, UA Negative     Studies: CBC, CMET, Lipid, PSA, TSH,.     Assessment/Plan:  62 y.o. y/o white male here for his yearly physical exam. He is behind on  his colonoscopies and states he will call directly to get this scheduled once he checks on his insurance. He has a right inguinal hernia and will make referral to Dr. Wenda Low for repair of this. It is 5 years since he had cardiovascular evaluation and this needs to be performed again. He had a 1 cm wart on his right thumb which was treated with cry all first 7 seconds x3 . * -  Signed, Earl Lites, MD 09/24/2012 5:10 PM

## 2012-11-07 ENCOUNTER — Ambulatory Visit (INDEPENDENT_AMBULATORY_CARE_PROVIDER_SITE_OTHER): Payer: Self-pay | Admitting: Surgery

## 2012-11-11 ENCOUNTER — Other Ambulatory Visit (HOSPITAL_COMMUNITY): Payer: Self-pay | Admitting: Cardiovascular Disease

## 2012-11-11 DIAGNOSIS — I059 Rheumatic mitral valve disease, unspecified: Secondary | ICD-10-CM

## 2012-11-29 ENCOUNTER — Ambulatory Visit (INDEPENDENT_AMBULATORY_CARE_PROVIDER_SITE_OTHER): Payer: BC Managed Care – PPO | Admitting: Surgery

## 2012-12-09 ENCOUNTER — Encounter (INDEPENDENT_AMBULATORY_CARE_PROVIDER_SITE_OTHER): Payer: BC Managed Care – PPO

## 2012-12-09 ENCOUNTER — Ambulatory Visit (HOSPITAL_COMMUNITY)
Admission: RE | Admit: 2012-12-09 | Discharge: 2012-12-09 | Disposition: A | Discharge status: Home / Self Care | Payer: BC Managed Care – PPO | Source: Ambulatory Visit | Attending: Cardiovascular Disease | Admitting: Cardiovascular Disease

## 2012-12-09 DIAGNOSIS — I079 Rheumatic tricuspid valve disease, unspecified: Secondary | ICD-10-CM | POA: Insufficient documentation

## 2012-12-09 DIAGNOSIS — I10 Essential (primary) hypertension: Secondary | ICD-10-CM | POA: Insufficient documentation

## 2012-12-09 DIAGNOSIS — I059 Rheumatic mitral valve disease, unspecified: Secondary | ICD-10-CM

## 2012-12-09 DIAGNOSIS — Z8249 Family history of ischemic heart disease and other diseases of the circulatory system: Secondary | ICD-10-CM | POA: Insufficient documentation

## 2012-12-09 DIAGNOSIS — E785 Hyperlipidemia, unspecified: Secondary | ICD-10-CM | POA: Insufficient documentation

## 2012-12-09 LAB — PULMONARY FUNCTION TEST

## 2012-12-09 NOTE — Progress Notes (Signed)
2D Echo Performed 12/09/2012    Tzipporah Nagorski, RCS  

## 2012-12-12 ENCOUNTER — Encounter: Payer: Self-pay | Admitting: Cardiovascular Disease

## 2012-12-17 ENCOUNTER — Encounter: Payer: Self-pay | Admitting: Cardiovascular Disease

## 2012-12-17 ENCOUNTER — Ambulatory Visit (INDEPENDENT_AMBULATORY_CARE_PROVIDER_SITE_OTHER): Payer: BC Managed Care – PPO | Admitting: Cardiovascular Disease

## 2012-12-17 VITALS — BP 120/72 | HR 64 | Ht 75.0 in | Wt 221.5 lb

## 2012-12-17 DIAGNOSIS — I119 Hypertensive heart disease without heart failure: Secondary | ICD-10-CM

## 2012-12-17 DIAGNOSIS — E785 Hyperlipidemia, unspecified: Secondary | ICD-10-CM

## 2012-12-17 MED ORDER — LOSARTAN POTASSIUM 50 MG PO TABS: 50.0000 mg | ORAL_TABLET | Freq: Every day | ORAL | Status: DC

## 2012-12-17 NOTE — Patient Instructions (Signed)
Your physician has recommended you make the following change in your medication: Stop your HCTZ. Start losartan 50mg . Increase your atorvastatin to 40 mg.   Your physician recommends that you return for lab work in ( B-MET 2 WEEKS).      NMR in 2 months.  Your physician recommends that you schedule a follow-up appointment in: 2-3 months.

## 2012-12-27 ENCOUNTER — Telehealth: Payer: Self-pay

## 2012-12-27 NOTE — Telephone Encounter (Signed)
Printed it is at front desk for pick up.

## 2012-12-27 NOTE — Telephone Encounter (Signed)
Pt would like a copy of his immunization record. Best# [id number]

## 2012-12-30 ENCOUNTER — Telehealth: Payer: Self-pay | Admitting: Cardiovascular Disease

## 2012-12-30 NOTE — Telephone Encounter (Signed)
Wants know if he can go to any [facility] lab?

## 2012-12-30 NOTE — Telephone Encounter (Signed)
Returned call and informed pt he can go to any [facility] lab.  Pt verbalized understanding and agreed w/ plan.

## 2013-01-02 ENCOUNTER — Encounter: Payer: Self-pay | Admitting: Cardiovascular Disease

## 2013-01-02 NOTE — Progress Notes (Signed)
Patient ID: Clarence Ware, male   DOB: 1950-12-29, 62 y.o.   MRN: [id number]     HPI: Clarence Ware, is a 62 y.o. male who I saw for initial cardiology evaluation on 11/07/2012 through the courtesy of Dr. Doppler. 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 He has a history of hyperlipidemia and hypertension. Possibly 7 years ago he had a normal routine treadmill test. The patient does exercise regularly. There is a prior history of substance abuse for which he did undergo treatment. He does also have a history of hyperlipidemia, erectile dysfunction, a history of intermittent headaches, and also possible right inguinal hernia for which he is to be seen by Dr. Wenda Low. When I saw him on 11/07/2012, we discussed the sensitivity and specificity of routine treadmill testing with a strong family history I elected to schedule him for a cardiopulmonary neck test for further evaluation would also be helpful to assess endothelial dysfunction/diastolic dysfunction evaluate potential for microvascular angina. He also had a soft cardiac murmur at his apex and recommended a 2-D echo Doppler study.  Clarence Ware is cardiopulmonary neck test to be old good functional capacity with an excellent maximum oxygen consumption peak of 92% of predicted. However, he did have a abnormal response during the last 7 minutes of exercise suggesting ischemic myocardial dysfunction with decreasing stroke volume with increasing work rate which corresponded to an abrupt decrease in cardiac output suggestive of exercise-induced myocardial dysfunction. He did have very mild ventilation/perfusion mismatch with reference to his pulmonary status. A 2-D echo Doppler study showed normal systolic function with an ejection fraction of 55-60%. He had normal diastolic  parameters. Presents to the office today for followup evaluation.  Past Medical History  Diagnosis Date  . Allergy   . Substance abuse   . Hyperlipidemia   . Hypertension     Past Surgical History  Procedure Laterality Date  . Vasectomy      No Known Allergies  Current Outpatient Prescriptions  Medication Sig Dispense Refill  . aspirin 325 MG buffered tablet Take 1 tablet (325 mg total) by mouth daily.  100 tablet  3  . atorvastatin (LIPITOR) 40 MG tablet Take 20 mg by mouth once.      . hydrochlorothiazide (HYDRODIURIL) 25 MG tablet Take 0.5 tablets (12.5 mg total) by mouth every morning.  90 tablet  3  . ibuprofen (ADVIL) 200 MG tablet Take 1 tablet (200 mg total) by mouth every 6 (six) hours as needed for pain.  100 tablet  3  . loratadine (CLARITIN) 10 MG tablet Take 1 tablet (10 mg total) by mouth daily.  30 tablet  11  . metoprolol (LOPRESSOR) 50 MG tablet Take one half tablet daily  90 tablet  3  . tadalafil (CIALIS) 20 MG tablet Use as directed.  6 tablet  11  . losartan (COZAAR) 50 MG tablet Take 1 tablet (50 mg total) by mouth daily.  90 tablet  3   No current facility-administered medications for this visit.    Socially he is married has 2 children no grandchildren. He does exercise at least 30 minutes at a time. There is a prior history of tobacco use but he quit over 3 years ago. Has a prior use of opiates.  ROS is negative for fevers, chills or night sweats. He denies PND  or orthopnea. He denies chest pain. He does note some occasional joint discomfort. There are no visual symptoms. He denies indigestion or GI symptoms. There is no edema. He denies neurologic symptoms.  Other system review is negative.  PE BP 120/72  Pulse 64  Ht 6\' 3"  (1.905 m)  Wt 221 lb 8 oz (100.472 kg)  BMI 27.69 kg/m2  General: Alert, oriented, no distress.  Skin: normal turgor, no rashes HEENT: Normocephalic, atraumatic. Pupils round and reactive; sclera anicteric;no lid lag.  Nose  without nasal septal hypertrophy Mouth/Parynx benign; Mallinpatti scale 2 Neck: No JVD, no carotid briuts Lungs: clear to ausculatation and percussion; no wheezing or rales Heart: RRR, s1 s2 normal 1/6 apical systolic murmur Abdomen: soft, nontender; no hepatosplenomehaly, BS+; abdominal aorta nontender and not dilated by palpation. Pulses 2+ Extremities: no clubbing cyanosis or edema, Homan's sign negative  Neurologic: grossly nonfocal    LABS:  BMET    Component Value Date/Time   NA 140 09/24/2012 1640   K 4.0 09/24/2012 1640   CL 101 09/24/2012 1640   CO2 31 09/24/2012 1640   GLUCOSE 96 09/24/2012 1640   BUN 22 09/24/2012 1640   CREATININE 1.00 09/24/2012 1640   CREATININE 1.10 06/27/2010 2008   CALCIUM 9.8 09/24/2012 1640     Hepatic Function Panel     Component Value Date/Time   PROT 7.0 09/24/2012 1640   ALBUMIN 4.7 09/24/2012 1640   AST 20 09/24/2012 1640   ALT 23 09/24/2012 1640   ALKPHOS 86 09/24/2012 1640   BILITOT 0.8 09/24/2012 1640     CBC    Component Value Date/Time   WBC 8.5 09/24/2012 1645   WBC 7.9 10/03/2011 1614   RBC 5.21 09/24/2012 1645   RBC 5.23 10/03/2011 1614   HGB 15.8 09/24/2012 1645   HGB 15.9 10/03/2011 1614   HCT 47.8 09/24/2012 1645   HCT 45.9 10/03/2011 1614   PLT 218 10/03/2011 1614   MCV 91.7 09/24/2012 1645   MCV 87.8 10/03/2011 1614   MCH 30.3 09/24/2012 1645   MCH 30.4 10/03/2011 1614   MCHC 33.0 09/24/2012 1645   MCHC 34.6 10/03/2011 1614   RDW 12.6 10/03/2011 1614     BNP No results found for this basename: probnp    Lipid Panel     Component Value Date/Time   CHOL 150 09/24/2012 1640   TRIG 102 09/24/2012 1640   HDL 43 09/24/2012 1640   CHOLHDL 3.5 09/24/2012 1640   VLDL 20 09/24/2012 1640   LDLCALC 87 09/24/2012 1640     RADIOLOGY: No results found.    ASSESSMENT AND PLAN: I long discussion with Mr. Shannahan. We spent considerable time discussing his cardiopulmonary that test he did have an abnormal cardiovascular response  with decreasing stroke volume associated with increasing work rate and abrupt decrease in cardiac output in the last 7 minutes of exercise. He did have an excellent functional status, however with a maximal oxygen consumption of 92% of predicted presently, I recommended he discontinue his hydrochlorothiazide since he's not having edema for blood pressure control. I believe he would benefit from ARB therapy in light of his cardiopulmonary neck findings and will start him on postoperative 50 mg daily. Also further titrating his atorvastatin 40 mg for more aggressive treatment. In 2 months he'll undergo an NMR lipoprofile for optimal assessment of LDL particle number with reference to his lipid therapy. I will see him in 3 months for followup evaluation.     Lennette Bihari,  MD, [facility]  01/02/2013 9:51 AM

## 2013-01-03 ENCOUNTER — Encounter (INDEPENDENT_AMBULATORY_CARE_PROVIDER_SITE_OTHER): Payer: Self-pay | Admitting: Surgery

## 2013-01-03 ENCOUNTER — Ambulatory Visit (INDEPENDENT_AMBULATORY_CARE_PROVIDER_SITE_OTHER): Payer: BC Managed Care – PPO | Admitting: Surgery

## 2013-01-03 VITALS — BP 120/72 | HR 68 | Temp 98.7°F | Resp 16 | Ht 75.0 in | Wt 223.2 lb

## 2013-01-03 DIAGNOSIS — K4090 Unilateral inguinal hernia, without obstruction or gangrene, not specified as recurrent: Secondary | ICD-10-CM

## 2013-01-03 NOTE — Progress Notes (Signed)
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 He has had this hernia for greater than a year and it has not bothered him. He has not had any periods of obstruction.  Physical exam confirms a right inguinal hernia that is reducible, soft, nontender. No left inguinal hernia noted. Testes without masses.  Past Medical History  Diagnosis Date  . Allergy   . Substance abuse   . Hyperlipidemia   . Hypertension     Past Surgical History  Procedure Laterality Date  . Vasectomy      Current Outpatient Prescriptions  Medication Sig Dispense Refill  . aspirin 325 MG buffered tablet Take 1 tablet (325 mg total) by mouth daily.  100 tablet  3  . atorvastatin (LIPITOR) 40 MG tablet Take 20 mg by mouth once.      Marland Kitchen ibuprofen (ADVIL) 200 MG tablet Take 1 tablet (200 mg total) by mouth every 6 (six) hours as needed for pain.  100 tablet  3  . loratadine (CLARITIN) 10 MG tablet Take 1 tablet (10 mg total) by mouth daily.  30 tablet  11  . losartan (COZAAR) 50 MG tablet Take 1 tablet (50 mg total) by mouth daily.  90 tablet  3  . metoprolol (LOPRESSOR) 50 MG tablet Take one half tablet daily  90 tablet  3  . tadalafil (CIALIS) 20 MG tablet Use as directed.  6 tablet  11   No current facility-administered medications for this visit.   Review of patient's allergies indicates no known allergies. Family History  Problem Relation Age of Onset  . Heart disease Mother   . Heart disease Father   . Hypertension Brother   . Diabetes Brother   . Cancer Brother    Social History:   reports that he quit smoking about 20 years ago. He does not have any smokeless tobacco history on file. He reports that he does not drink alcohol or use illicit drugs.   REVIEW OF SYSTEMS - PERTINENT POSITIVES ONLY: noncontributory  Physical Exam:   Blood  pressure 120/72, pulse 68, temperature 98.7 F (37.1 C), temperature source Temporal, resp. rate 16, height 6\' 3"  (1.905 m), weight 223 lb 3.2 oz (101.243 kg). Body mass index is 27.9 kg/(m^2).  Gen:  WDWN white male NAD  Neurological: Alert and oriented to person, place, and time. Motor and sensory function is grossly intact  Head: Normocephalic and atraumatic.  Eyes: Conjunctivae are normal. Pupils are equal, round, and reactive to light. No scleral icterus.   Abdomen:  Right inguinal hernia as noted above  LABORATORY RESULTS: No results found for this or any previous visit (from the past 48 hour(s)).  RADIOLOGY RESULTS: No results found.  Problem List: Patient Active Problem List   Diagnosis Date Noted  . Inguinal hernia, right 01/03/2013  . IMPAIRED FASTING GLUCOSE 06/30/2010  . NECK PAIN 02/01/2009  . SPRAIN&STRAIN OTHER SPECIFIED SITES KNEE&LEG 02/01/2009  . TENSION TYPE HEADACHE UNSPECIFIED 06/26/2008  . GERD 06/26/2008  . NEOPLASM OF UNCERTAIN BEHAVIOR OF SKIN 02/27/2008  . ERECTILE DYSFUNCTION, MILD 01/30/2008  . VENTRICULAR HYPERTROPHY, LEFT 11/29/2006  . COLON POLYP 09/06/2006  . HYPERLIPIDEMIA 09/06/2006  . HYPERTENSION, BENIGN SYSTEMIC 09/06/2006  . ASTHMA, EXERCISE INDUCED 09/06/2006    Assessment & Plan: Right inguinal hernia, asymptomatic, present for greater than one year He  is very comfortable observing this. We discussed what to look for should it become incarcerated. When he decides and if he decides to have this repaired and we could do this as an outpatient and using open technique.    Matt B. Daphine Deutscher, MD, [facility] [phone] beeper [phone]  01/03/2013 9:57 AM

## 2013-01-03 NOTE — Patient Instructions (Signed)

## 2013-03-03 LAB — BASIC METABOLIC PANEL
CO2: 26 mEq/L (ref 19–32)
Chloride: 105 mEq/L (ref 96–112)
Creat: 1.03 mg/dL (ref 0.50–1.35)

## 2013-03-04 LAB — NMR LIPOPROFILE WITH LIPIDS
HDL Particle Number: 29.1 umol/L — ABNORMAL LOW (ref >=30.5)
HDL-C: 46 mg/dL (ref >=40)
LDL Size: 21.1 nm (ref >20.5)
Large HDL-P: 5.4 umol/L (ref >=4.8)
Triglycerides: 58 mg/dL (ref <150)

## 2013-03-05 ENCOUNTER — Other Ambulatory Visit: Payer: Self-pay | Admitting: Gastroenterology

## 2013-03-07 ENCOUNTER — Encounter: Payer: Self-pay | Admitting: Cardiology

## 2013-03-07 DIAGNOSIS — Z8249 Family history of ischemic heart disease and other diseases of the circulatory system: Secondary | ICD-10-CM

## 2013-03-07 DIAGNOSIS — Z87891 Personal history of nicotine dependence: Secondary | ICD-10-CM

## 2013-03-13 ENCOUNTER — Other Ambulatory Visit: Payer: Self-pay | Admitting: *Deleted

## 2013-03-13 ENCOUNTER — Encounter: Payer: Self-pay | Admitting: *Deleted

## 2013-03-13 MED ORDER — ATORVASTATIN CALCIUM 80 MG PO TABS: 80.0000 mg | ORAL_TABLET | Freq: Every day | ORAL | Status: DC

## 2013-03-13 NOTE — Progress Notes (Signed)
Quick Note:  Lab note and atorvastatin 80 mg prescription sent to patient. ______

## 2013-03-14 ENCOUNTER — Encounter: Payer: Self-pay | Admitting: Cardiovascular Disease

## 2013-03-14 ENCOUNTER — Ambulatory Visit (INDEPENDENT_AMBULATORY_CARE_PROVIDER_SITE_OTHER): Payer: BC Managed Care – PPO | Admitting: Cardiovascular Disease

## 2013-03-14 VITALS — BP 120/80 | HR 56 | Ht 75.0 in | Wt 223.2 lb

## 2013-03-14 DIAGNOSIS — E785 Hyperlipidemia, unspecified: Secondary | ICD-10-CM

## 2013-03-14 DIAGNOSIS — Z8249 Family history of ischemic heart disease and other diseases of the circulatory system: Secondary | ICD-10-CM

## 2013-03-14 DIAGNOSIS — I119 Hypertensive heart disease without heart failure: Secondary | ICD-10-CM

## 2013-03-14 DIAGNOSIS — I440 Atrioventricular block, first degree: Secondary | ICD-10-CM

## 2013-03-14 MED ORDER — EZETIMIBE 10 MG PO TABS: 10.0000 mg | ORAL_TABLET | Freq: Every day | ORAL | Status: DC

## 2013-03-14 NOTE — Patient Instructions (Addendum)
Your physician has recommended you make the following change in your medication: start new RX for zetia  Results for orders placed in visit on 12/17/12  BASIC METABOLIC PANEL      Result Value Range   Sodium 139  135 - 145 mEq/L   Potassium 4.1  3.5 - 5.3 mEq/L   Chloride 105  96 - 112 mEq/L   CO2 26  19 - 32 mEq/L   Glucose, Bld 100 (*) 70 - 99 mg/dL   BUN 19  6 - 23 mg/dL   Creat 1.61  0.96 - 0.45 mg/dL   Calcium 9.2  8.4 - 40.9 mg/dL  NMR LIPOPROFILE WITH LIPIDS      Result Value Range   LDL Particle Number [id number] (*) <1000 nmol/L   LDL (calc) 86  <100 mg/dL   HDL-C 46  >=81 mg/dL   Triglycerides 58  <191 mg/dL   Cholesterol, Total 478  <200 mg/dL   HDL Particle Number 29.5 (*) >=30.5 umol/L   Large HDL-P 5.4  >=4.8 umol/L   Large VLDL-P 1.2  <=2.7 nmol/L   Small LDL Particle Number 552 (*) <=527 nmol/L   LDL Size 21.1  >20.5 nm   HDL Size 9.0 (*) >=9.2 nm   VLDL Size 42.0  <=46.6 nm   LP-IR Score 33  <=45

## 2013-03-25 ENCOUNTER — Telehealth: Payer: Self-pay | Admitting: Cardiovascular Disease

## 2013-03-25 NOTE — Telephone Encounter (Signed)
Calling in reference to a letter that he received from [place].

## 2013-03-25 NOTE — Telephone Encounter (Signed)
Returned call.  Pt stated he received the letter from Dr. Tresa Endo with his cholesterol results.  Stated it says to increase Lipitor to 80 mg, but that's not what Dr. Tresa Endo told him at his last appt.  Stated he was told to take Zetia.  Reviewed chart.  Pt informed letter was mailed prior to his appt w/ Dr. Tresa Endo on 9.5.14 and advised he follow instructions given at visit on 9.5.14.  Pt verbalized understanding and agreed w/ plan.  Med list updated: Atorvastatin 40 mg daily

## 2013-03-26 ENCOUNTER — Encounter (HOSPITAL_COMMUNITY): Payer: Self-pay | Admitting: *Deleted

## 2013-03-30 ENCOUNTER — Encounter: Payer: Self-pay | Admitting: Cardiovascular Disease

## 2013-03-30 DIAGNOSIS — I440 Atrioventricular block, first degree: Secondary | ICD-10-CM | POA: Insufficient documentation

## 2013-03-30 NOTE — Progress Notes (Signed)
Patient ID: Clarence Ware, male   DOB: 10/20/1950, 62 y.o.   MRN: [id number]     HPI: Clarence Ware, is a 62 y.o. male presents to the office today for followup cardiology evaluation. I initially saw him on 11/07/2012 through the courtesy of Dr. Cleta Alberts.  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 He has a history of hyperlipidemia and hypertension. Approximately 7 years ago he had a normal routine treadmill test. The patient has continued to exercise regularly. There is a prior history of substance abuse for which he did undergo treatment. He does also have a history of hyperlipidemia, erectile dysfunction, a history of intermittent headaches, and also possible right inguinal hernia for which he is to be seen by Dr. Wenda Low. When I saw him on 11/07/2012, we discussed the sensitivity and specificity of routine treadmill testing with a strong family history I elected to schedule him for a cardiopulmonary neck test for further evaluation would also be helpful to assess endothelial dysfunction/diastolic dysfunction evaluate potential for microvascular angina. He also had a soft cardiac murmur at his apex and recommended a 2-D echo Doppler study.  Clarence Ware underwent a cardiopulmonary met test and had good functional capacity with an excellent maximum oxygen consumption peak of 92% of predicted. However, he did have a abnormal response during the last several minutes of exercise suggesting ischemic myocardial dysfunction with decreasing stroke volume with increasing work rate which corresponded to an abrupt decrease in cardiac output suggestive of exercise-induced myocardial dysfunction. He did have very mild ventilation/perfusion mismatch with reference to his pulmonary status. A 2-D echo Doppler study showed normal systolic function with an  ejection fraction of 55-60%. He had normal diastolic parameters.  I saw him in followup of his noninvasive studies in June 2014. In light of his mild abnormality noted during the last 7 minutes of exercise I recommended the addition of ARB therapy and further titrated his atorvastatin for aggressive lipid management. He has tolerated initiation of losartan.  He denies chest pain. He denies palpitations. He denies shortness of breath. He did have a followup NMR lipoprotein of which showed an LDL particle number of 1216 with a calculated LDL of 86, HDL 46, triglycerides 58, total cholesterol 144. He had 552 small LDL some particles. HDL particle number was reduced at 29.1. Insulin resistance score was normal at 33.  Past Medical History  Diagnosis Date  . Allergy   . Substance abuse   . Hyperlipidemia   . Hypertension     Past Surgical History  Procedure Laterality Date  . Vasectomy      Allergies  Allergen Reactions  . Nexium [Esomeprazole Magnesium]     Awful regurgitation    Current Outpatient Prescriptions  Medication Sig Dispense Refill  . aspirin 325 MG buffered tablet Take 1 tablet (325 mg total) by mouth daily.  100 tablet  3  . ibuprofen (ADVIL) 200 MG tablet Take 1 tablet (200 mg total) by mouth every 6 (six) hours as needed for pain.  100 tablet  3  . loratadine (CLARITIN) 10 MG tablet Take 1 tablet (10 mg total) by mouth daily.  30 tablet  11  . losartan (COZAAR) 50 MG tablet Take 25 mg by mouth daily.      . metoprolol (LOPRESSOR) 50 MG tablet Take one half tablet daily  90 tablet  3  . tadalafil (CIALIS) 20 MG tablet Use as directed.  6 tablet  11  . atorvastatin (LIPITOR) 40 MG tablet Take 1 tablet (40 mg total) by mouth daily.  30 tablet  11  . ezetimibe (ZETIA) 10 MG tablet Take 1 tablet (10 mg total) by mouth daily.  90 tablet  3   No current facility-administered medications for this visit.    Socially he is married has 2 children no grandchildren. He does  exercise at least 30 minutes at a time. There is a prior history of tobacco use but he quit over 3 years ago. Has a prior use of opiates. 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  ROS is negative for fevers, chills or night sweats. He denies PND or orthopnea. He denies chest pain. He does note some occasional joint discomfort. There are no visual symptoms. He denies indigestion or GI symptoms. There is no edema. He denies neurologic symptoms.  He denies myalgias Other system review is negative.  PE BP 120/80  Pulse 56  Ht 6\' 3"  (1.905 m)  Wt 223 lb 3.2 oz (101.243 kg)  BMI 27.9 kg/m2  Repeat blood pressure by me was 110/70 General: Alert, oriented, no distress.  Skin: normal turgor, no rashes HEENT: Normocephalic, atraumatic. Pupils round and reactive; sclera anicteric;no lid lag.  Nose without nasal septal hypertrophy Mouth/Parynx benign; Mallinpatti scale 2 Neck: No JVD, no carotid briuts Lungs: clear to ausculatation and percussion; no wheezing or rales Heart: RRR, s1 s2 normal 1/6 apical systolic murmur Abdomen: soft, nontender; no hepatosplenomehaly, BS+; abdominal aorta nontender and not dilated by palpation. Pulses 2+ Extremities: no clubbing cyanosis or edema, Homan's sign negative  Neurologic: grossly nonfocal  ECG: Sinus bradycardia for the first degree AV block. PR interval  220 ms. QT interval 393 ms.  LABS:  BMET    Component Value Date/Time   NA 139 03/03/2013 0855   K 4.1 03/03/2013 0855   CL 105 03/03/2013 0855   CO2 26 03/03/2013 0855   GLUCOSE 100* 03/03/2013 0855   BUN 19 03/03/2013 0855   CREATININE 1.03 03/03/2013 0855   CREATININE 1.10 06/27/2010 2008   CALCIUM 9.2 03/03/2013 0855     Hepatic Function Panel     Component Value Date/Time   PROT 7.0 09/24/2012 1640   ALBUMIN 4.7 09/24/2012 1640   AST 20 09/24/2012 1640   ALT 23 09/24/2012 1640   ALKPHOS 86 09/24/2012 1640    BILITOT 0.8 09/24/2012 1640     CBC    Component Value Date/Time   WBC 8.5 09/24/2012 1645   WBC 7.9 10/03/2011 1614   RBC 5.21 09/24/2012 1645   RBC 5.23 10/03/2011 1614   HGB 15.8 09/24/2012 1645   HGB 15.9 10/03/2011 1614   HCT 47.8 09/24/2012 1645   HCT 45.9 10/03/2011 1614   PLT 218 10/03/2011 1614   MCV 91.7 09/24/2012 1645   MCV 87.8 10/03/2011 1614   MCH 30.3 09/24/2012 1645   MCH 30.4 10/03/2011 1614   MCHC 33.0 09/24/2012 1645   MCHC 34.6 10/03/2011 1614   RDW 12.6 10/03/2011 1614     BNP No results found for this basename: probnp    Lipid Panel     Component Value Date/Time   CHOL 150 09/24/2012 1640   TRIG 58 03/03/2013 0855   TRIG 102 09/24/2012 1640   HDL 43 09/24/2012 1640  CHOLHDL 3.5 09/24/2012 1640   VLDL 20 09/24/2012 1640   LDLCALC 86 03/03/2013 0855   LDLCALC 87 09/24/2012 1640     RADIOLOGY: No results found.    ASSESSMENT AND PLAN: Clarence Ware remains asymptomatic with reference to his cardiac status. However, his cardiopulmonary neck test did demonstrate an asymptomatic ischemic response to the latter phase of exercise. He did have good maximum oxygen consumption. He now is tolerating ARB therapy. He does have mild first degree AV block on low-dose beta blocker therapy. I did review his most recent lipid panel. He had increased his atorvastatin from 40-80 mg previously. This does not seem to show significant major change in his LDL particle number per for this reason I will reduce his atorvastatin back to 40 mg and add Zetia at 10 mg to his regimen which should provide improved LDL particle lowering with combination therapy compared to high-dose atorvastatin alone. Followup laboratory will be obtained in 3 months. I will see him in 6 months for further evaluation.   Lennette Bihari, MD, [facility]  03/30/2013 11:38 AM

## 2013-03-31 ENCOUNTER — Encounter (HOSPITAL_COMMUNITY): Payer: Self-pay | Admitting: Pharmacy Technician

## 2013-04-15 ENCOUNTER — Ambulatory Visit (HOSPITAL_COMMUNITY)
Admission: RE | Admit: 2013-04-15 | Payer: BC Managed Care – PPO | Source: Ambulatory Visit | Admitting: Gastroenterology

## 2013-04-15 SURGERY — COLONOSCOPY WITH PROPOFOL
Anesthesia: Moderate Sedation

## 2013-06-13 ENCOUNTER — Ambulatory Visit: Payer: BC Managed Care – PPO | Admitting: Cardiovascular Disease

## 2013-12-16 ENCOUNTER — Ambulatory Visit (INDEPENDENT_AMBULATORY_CARE_PROVIDER_SITE_OTHER): Payer: BC Managed Care – PPO | Admitting: Emergency Medicine

## 2013-12-16 ENCOUNTER — Encounter: Payer: Self-pay | Admitting: Emergency Medicine

## 2013-12-16 VITALS — BP 120/74 | HR 73 | Temp 99.2°F | Resp 16 | Ht 74.0 in | Wt 213.8 lb

## 2013-12-16 DIAGNOSIS — I10 Essential (primary) hypertension: Secondary | ICD-10-CM

## 2013-12-16 DIAGNOSIS — Z9109 Other allergy status, other than to drugs and biological substances: Secondary | ICD-10-CM

## 2013-12-16 DIAGNOSIS — Z1329 Encounter for screening for other suspected endocrine disorder: Secondary | ICD-10-CM

## 2013-12-16 DIAGNOSIS — E785 Hyperlipidemia, unspecified: Secondary | ICD-10-CM

## 2013-12-16 DIAGNOSIS — Z8249 Family history of ischemic heart disease and other diseases of the circulatory system: Secondary | ICD-10-CM

## 2013-12-16 DIAGNOSIS — Z130 Encounter for screening for diseases of the blood and blood-forming organs and certain disorders involving the immune mechanism: Secondary | ICD-10-CM

## 2013-12-16 DIAGNOSIS — Z13228 Encounter for screening for other metabolic disorders: Secondary | ICD-10-CM

## 2013-12-16 DIAGNOSIS — Z1211 Encounter for screening for malignant neoplasm of colon: Secondary | ICD-10-CM

## 2013-12-16 DIAGNOSIS — Z125 Encounter for screening for malignant neoplasm of prostate: Secondary | ICD-10-CM

## 2013-12-16 DIAGNOSIS — N529 Male erectile dysfunction, unspecified: Secondary | ICD-10-CM

## 2013-12-16 DIAGNOSIS — Z0000 Encounter for general adult medical examination without abnormal findings: Secondary | ICD-10-CM

## 2013-12-16 DIAGNOSIS — Z1321 Encounter for screening for nutritional disorder: Secondary | ICD-10-CM

## 2013-12-16 LAB — POCT URINALYSIS DIPSTICK
Blood, UA: NEGATIVE
Glucose, UA: NEGATIVE
LEUKOCYTES UA: NEGATIVE
NITRITE UA: NEGATIVE
PH UA: 5.5
Spec Grav, UA: >=1.03
UROBILINOGEN UA: 0.2

## 2013-12-16 LAB — CBC WITH DIFFERENTIAL/PLATELET
Basophils Absolute: 0 10*3/uL (ref 0.0–0.1)
Basophils Relative: 0 % (ref 0–1)
Eosinophils Absolute: 0.2 10*3/uL (ref 0.0–0.7)
Eosinophils Relative: 2 % (ref 0–5)
HEMATOCRIT: 41.2 % (ref 39.0–52.0)
Hemoglobin: 14.6 g/dL (ref 13.0–17.0)
Lymphocytes Relative: 14 % (ref 12–46)
Lymphs Abs: 1.2 10*3/uL (ref 0.7–4.0)
MCH: 30.4 pg (ref 26.0–34.0)
MCHC: 35.4 g/dL (ref 30.0–36.0)
MCV: 85.7 fL (ref 78.0–100.0)
MONO ABS: 0.7 10*3/uL (ref 0.1–1.0)
Monocytes Relative: 8 % (ref 3–12)
Neutro Abs: 6.5 10*3/uL (ref 1.7–7.7)
Neutrophils Relative %: 76 % (ref 43–77)
Platelets: 208 10*3/uL (ref 150–400)
RBC: 4.81 MIL/uL (ref 4.22–5.81)
RDW: 13.5 % (ref 11.5–15.5)
WBC: 8.6 10*3/uL (ref 4.0–10.5)

## 2013-12-16 LAB — COMPLETE METABOLIC PANEL WITH GFR
ALBUMIN: 4.2 g/dL (ref 3.5–5.2)
ALK PHOS: 73 U/L (ref 39–117)
ALT: 22 U/L (ref 0–53)
AST: 22 U/L (ref 0–37)
BUN: 26 mg/dL — AB (ref 6–23)
CO2: 24 mEq/L (ref 19–32)
CREATININE: 1.17 mg/dL (ref 0.50–1.35)
Calcium: 9.3 mg/dL (ref 8.4–10.5)
Chloride: 105 mEq/L (ref 96–112)
GFR, Est African American: 76 mL/min
GFR, Est Non African American: 66 mL/min
Glucose, Bld: 86 mg/dL (ref 70–99)
POTASSIUM: 4.3 meq/L (ref 3.5–5.3)
Sodium: 138 mEq/L (ref 135–145)
Total Bilirubin: 0.8 mg/dL (ref 0.2–1.2)
Total Protein: 6.6 g/dL (ref 6.0–8.3)

## 2013-12-16 LAB — LIPID PANEL
CHOL/HDL RATIO: 3.3 ratio
CHOLESTEROL: 154 mg/dL (ref 0–200)
HDL: 47 mg/dL (ref >39)
LDL Cholesterol: 95 mg/dL (ref 0–99)
TRIGLYCERIDES: 58 mg/dL (ref <150)
VLDL: 12 mg/dL (ref 0–40)

## 2013-12-16 LAB — IFOBT (OCCULT BLOOD): IMMUNOLOGICAL FECAL OCCULT BLOOD TEST: NEGATIVE

## 2013-12-16 MED ORDER — ATORVASTATIN CALCIUM 20 MG PO TABS: 20.0000 mg | ORAL_TABLET | Freq: Every day | ORAL | Status: DC

## 2013-12-16 MED ORDER — EZETIMIBE 10 MG PO TABS: 10.0000 mg | ORAL_TABLET | Freq: Every day | ORAL | Status: DC

## 2013-12-16 MED ORDER — TADALAFIL 20 MG PO TABS: ORAL_TABLET | ORAL | Status: DC

## 2013-12-16 MED ORDER — LORATADINE 10 MG PO TABS: 10.0000 mg | ORAL_TABLET | Freq: Every day | ORAL | Status: DC

## 2013-12-16 MED ORDER — METOPROLOL TARTRATE 50 MG PO TABS: ORAL_TABLET | ORAL | Status: DC

## 2013-12-16 MED ORDER — LOSARTAN POTASSIUM 50 MG PO TABS: 25.0000 mg | ORAL_TABLET | Freq: Every morning | ORAL | Status: DC

## 2013-12-16 MED ORDER — ATORVASTATIN CALCIUM 40 MG PO TABS: ORAL_TABLET | ORAL | Status: DC

## 2013-12-16 MED ORDER — IBUPROFEN 200 MG PO TABS: 200.0000 mg | ORAL_TABLET | Freq: Four times a day (QID) | ORAL | Status: DC | PRN

## 2013-12-16 MED ORDER — METOPROLOL TARTRATE 50 MG PO TABS: 25.0000 mg | ORAL_TABLET | Freq: Every morning | ORAL | Status: DC

## 2013-12-16 NOTE — Progress Notes (Signed)
   Subjective:    Patient ID: Clarence Ware, male    DOB: 01-14-1951, 63 y.o.   MRN: [id number]  HPI    Review of Systems  Constitutional: Negative.   HENT: Positive for hearing loss.   Eyes: Negative.   Respiratory: Negative.   Cardiovascular: Negative.   Gastrointestinal: Positive for constipation.  Endocrine: Negative.   Genitourinary: Negative.   Musculoskeletal: Negative.   Skin: Negative.   Allergic/Immunologic: Positive for environmental allergies.  Neurological: Negative.   Hematological: Negative.   Psychiatric/Behavioral: Negative.        Objective:   Physical Exam HEENT exam is unremarkable. Neck is supple. Chest is clear to auscultation and percussion. Heart is a regular rate and rhythm. Is a 2/6 systolic murmur at the apex of the heart. There is easily reducible right inguinal hernia. There is a 4 mm pigmented lesion right anterior chest.  Meds ordered this encounter  Medications  . tadalafil (CIALIS) 20 MG tablet    Sig: Use as directed.    Dispense:  6 tablet    Refill:  11  . metoprolol (LOPRESSOR) 50 MG tablet    Sig: Take 0.5 tablets (25 mg total) by mouth every morning.    Dispense:  45 tablet    Refill:  3  . losartan (COZAAR) 50 MG tablet    Sig: Take 0.5 tablets (25 mg total) by mouth every morning.    Dispense:  90 tablet    Refill:  3  . ezetimibe (ZETIA) 10 MG tablet    Sig: Take 1 tablet (10 mg total) by mouth daily.    Dispense:  90 tablet    Refill:  3  . atorvastatin (LIPITOR) 20 MG tablet    Sig: Take 1 tablet (20 mg total) by mouth at bedtime.    Dispense:  90 tablet    Refill:  3   Results for orders placed in visit on 12/16/13  IFOBT (OCCULT BLOOD)      Result Value Ref Range   IFOBT Negative    POCT URINALYSIS DIPSTICK      Result Value Ref Range   Color, UA dk yellow     Clarity, UA clear     Glucose, UA neg     Bilirubin, UA small     Ketones, UA 15mg      Spec Grav, UA >=1.030     Blood, UA neg     pH, UA 5.5     Protein, UA trace     Urobilinogen, UA 0.2     Nitrite, UA neg     Leukocytes, UA Negative          Assessment & Plan:  All meds refilled for 90 days. Appointment made for removal of a lesion right anterior chest. He will schedule his own colonoscopy after July 1. He will call me if he needs a referral for the colonoscopy. He will return to clinic for removal of a mole on his anterior chest wall.

## 2013-12-17 LAB — VITAMIN D 25 HYDROXY (VIT D DEFICIENCY, FRACTURES): Vit D, 25-Hydroxy: 52 ng/mL (ref 30–89)

## 2014-01-01 ENCOUNTER — Encounter: Payer: Self-pay | Admitting: Emergency Medicine

## 2014-01-01 ENCOUNTER — Ambulatory Visit (INDEPENDENT_AMBULATORY_CARE_PROVIDER_SITE_OTHER): Payer: BC Managed Care – PPO | Admitting: Emergency Medicine

## 2014-01-01 VITALS — BP 116/73 | HR 60 | Temp 98.0°F | Resp 16 | Ht 74.5 in | Wt 215.0 lb

## 2014-01-01 DIAGNOSIS — D229 Melanocytic nevi, unspecified: Secondary | ICD-10-CM

## 2014-01-01 DIAGNOSIS — D239 Other benign neoplasm of skin, unspecified: Secondary | ICD-10-CM

## 2014-01-01 DIAGNOSIS — B079 Viral wart, unspecified: Secondary | ICD-10-CM

## 2014-01-01 NOTE — Progress Notes (Signed)
Subjective:    Patient ID: Clarence Ware, male    DOB: 11-12-1950, 63 y.o.   MRN: [id number]  HPI Scribed for Nena Jordan MD, the patient was seen in room 22. This chart was scribed by Denice Bors, ED scribe. Patient's care was started at 11:02 AM  HPI Comments: Hx was provided by the pt.  Clarence Ware is a 63 y.o. male who presents to the [facility] for mole, and wart removal/follow up appointment. Describes change in mole on right anterior chest as mild and with a darkening area, which he noted earlier this month. Additionally, reports a wart on flexor surface of left thumb, which he noted this week. Denies any aggravating or alleviating factors. Denies associated fever, any pain to site of mole or wart, other skin changes, and drainage from site of mole.     Past Medical History  Diagnosis Date  . Allergy   . Substance abuse   . Hyperlipidemia   . Hypertension     Past Surgical History  Procedure Laterality Date  . Vasectomy      Family History  Problem Relation Age of Onset  . Heart disease Mother   . Stroke Mother   . Heart disease Father 105    MI  . Hypertension Father   . Hypertension Brother   . Diabetes Brother   . Cancer Brother   . Coronary artery disease Brother 57    CABG    History   Social History  . Marital Status: Married    Spouse Name: N/A    Number of Children: N/A  . Years of Education: N/A   Occupational History  . Pharmacist [place]   Social History Main Topics  . Smoking status: Former Smoker -- 0.25 packs/day for 1 years    Quit date: 07/10/1992  . Smokeless tobacco: Never Used  . Alcohol Use: No  . Drug Use: No  . Sexual Activity: Yes   Other Topics Concern  . Not on file   Social History Narrative   Married. Education: [employer].    Allergies  Allergen Reactions  . Nexium [Esomeprazole Magnesium]     Awful regurgitation    Patient Active Problem List   Diagnosis Date Noted  .  First degree heart block 03/30/2013  . Smoking hx 03/07/2013  . Family history of coronary artery disease 03/07/2013  . Inguinal hernia, right 01/03/2013  . IMPAIRED FASTING GLUCOSE 06/30/2010  . NECK PAIN 02/01/2009  . SPRAIN&STRAIN OTHER SPECIFIED SITES KNEE&LEG 02/01/2009  . TENSION TYPE HEADACHE UNSPECIFIED 06/26/2008  . GERD 06/26/2008  . NEOPLASM OF UNCERTAIN BEHAVIOR OF SKIN 02/27/2008  . ERECTILE DYSFUNCTION, MILD 01/30/2008  . VENTRICULAR HYPERTROPHY, LEFT 11/29/2006  . COLON POLYP 09/06/2006  . HYPERLIPIDEMIA 09/06/2006  . HYPERTENSION, BENIGN SYSTEMIC 09/06/2006  . ASTHMA, EXERCISE INDUCED 09/06/2006    Filed Vitals:   01/01/14 1055  BP: 116/73  Pulse: 60  Temp: 98 F (36.7 C)  Resp: 16  Height: 6' 2.5" (1.892 m)  Weight: 215 lb (97.523 kg)  SpO2: 97%         Review of Systems  Constitutional: Negative for fever.  Skin:       Wart and mole  All other systems reviewed and are negative.      Objective:   Physical Exam Physical Exam  Vitals reviewed. Constitutional: He is oriented to person, place, and time. He appears well-developed and well-nourished. No distress.  HENT:  Head: Normocephalic and atraumatic.  Throat: Oropharynx is clear and moist. Mucous membranes normal. Uvula is midline.  Eyes: EOM are normal.  Neck: Neck supple. No tracheal deviation present.  Cardiovascular: Normal rate.   Pulmonary/Chest: Effort normal. No respiratory distress.  Musculoskeletal: Normal range of motion.  Neurological: He is alert and oriented to person, place, and time.  Skin: Skin is warm and dry. 4 by 4 mm mole on right anterior chest appears darkened. Borders appear clear and non-erythematous. Wart noted on flexor surface of left thumb. Psychiatric: He has a normal mood and affect. His behavior is normal.   11:26 AM right anterior chest and mole cleaned with betadine and anesthestized with 1% lidocaine without epi and removed by shave biopsy. Lesion sent for  pathology evaluation. Wart was treated with liquid nitrogen for 6 seconds times 3.        Assessment & Plan:  No diagnosis found. Lesion sent for pathology. His wart on the left arm was treated with cryotherapy. Await path report

## 2014-10-13 ENCOUNTER — Encounter: Payer: Self-pay | Admitting: Emergency Medicine

## 2014-10-13 ENCOUNTER — Ambulatory Visit (INDEPENDENT_AMBULATORY_CARE_PROVIDER_SITE_OTHER): Payer: 59 | Admitting: Emergency Medicine

## 2014-10-13 ENCOUNTER — Ambulatory Visit: Payer: 59

## 2014-10-13 VITALS — BP 136/90 | HR 56 | Temp 97.5°F | Resp 16 | Ht 74.5 in | Wt 219.2 lb

## 2014-10-13 DIAGNOSIS — Z1211 Encounter for screening for malignant neoplasm of colon: Secondary | ICD-10-CM | POA: Diagnosis not present

## 2014-10-13 DIAGNOSIS — Z1321 Encounter for screening for nutritional disorder: Secondary | ICD-10-CM

## 2014-10-13 DIAGNOSIS — E785 Hyperlipidemia, unspecified: Secondary | ICD-10-CM | POA: Diagnosis not present

## 2014-10-13 DIAGNOSIS — Z125 Encounter for screening for malignant neoplasm of prostate: Secondary | ICD-10-CM | POA: Diagnosis not present

## 2014-10-13 DIAGNOSIS — Z0000 Encounter for general adult medical examination without abnormal findings: Secondary | ICD-10-CM | POA: Diagnosis not present

## 2014-10-13 DIAGNOSIS — I10 Essential (primary) hypertension: Secondary | ICD-10-CM

## 2014-10-13 LAB — CBC WITH DIFFERENTIAL/PLATELET
BASOS PCT: 0 % (ref 0–1)
Basophils Absolute: 0 10*3/uL (ref 0.0–0.1)
Eosinophils Absolute: 0.4 10*3/uL (ref 0.0–0.7)
Eosinophils Relative: 6 % — ABNORMAL HIGH (ref 0–5)
HCT: 42.6 % (ref 39.0–52.0)
Hemoglobin: 14.5 g/dL (ref 13.0–17.0)
LYMPHS PCT: 22 % (ref 12–46)
Lymphs Abs: 1.6 10*3/uL (ref 0.7–4.0)
MCH: 29.8 pg (ref 26.0–34.0)
MCHC: 34 g/dL (ref 30.0–36.0)
MCV: 87.5 fL (ref 78.0–100.0)
MONO ABS: 0.9 10*3/uL (ref 0.1–1.0)
MONOS PCT: 12 % (ref 3–12)
MPV: 9.3 fL (ref 8.6–12.4)
Neutro Abs: 4.3 10*3/uL (ref 1.7–7.7)
Neutrophils Relative %: 60 % (ref 43–77)
Platelets: 222 10*3/uL (ref 150–400)
RBC: 4.87 MIL/uL (ref 4.22–5.81)
RDW: 13.3 % (ref 11.5–15.5)
WBC: 7.2 10*3/uL (ref 4.0–10.5)

## 2014-10-13 LAB — POCT URINALYSIS DIPSTICK
BILIRUBIN UA: NEGATIVE
Blood, UA: NEGATIVE
Glucose, UA: NEGATIVE
KETONES UA: NEGATIVE
Leukocytes, UA: NEGATIVE
Nitrite, UA: NEGATIVE
PROTEIN UA: NEGATIVE
Urobilinogen, UA: 0.2
pH, UA: 5.5

## 2014-10-13 LAB — TSH: TSH: 1.822 u[IU]/mL (ref 0.350–4.500)

## 2014-10-13 LAB — COMPLETE METABOLIC PANEL WITH GFR
ALT: 18 U/L (ref 0–53)
AST: 18 U/L (ref 0–37)
Albumin: 4.4 g/dL (ref 3.5–5.2)
Alkaline Phosphatase: 82 U/L (ref 39–117)
BUN: 16 mg/dL (ref 6–23)
CALCIUM: 9.1 mg/dL (ref 8.4–10.5)
CHLORIDE: 101 meq/L (ref 96–112)
CO2: 25 meq/L (ref 19–32)
Creat: 0.91 mg/dL (ref 0.50–1.35)
GFR, Est Non African American: 89 mL/min
GLUCOSE: 81 mg/dL (ref 70–99)
Potassium: 4.1 mEq/L (ref 3.5–5.3)
Sodium: 135 mEq/L (ref 135–145)
TOTAL PROTEIN: 6.6 g/dL (ref 6.0–8.3)
Total Bilirubin: 0.5 mg/dL (ref 0.2–1.2)

## 2014-10-13 LAB — LIPID PANEL
Cholesterol: 144 mg/dL (ref 0–200)
HDL: 47 mg/dL (ref >=40)
LDL Cholesterol: 88 mg/dL (ref 0–99)
TRIGLYCERIDES: 44 mg/dL (ref <150)
Total CHOL/HDL Ratio: 3.1 Ratio
VLDL: 9 mg/dL (ref 0–40)

## 2014-10-13 MED ORDER — LOSARTAN POTASSIUM 50 MG PO TABS: 25.0000 mg | ORAL_TABLET | Freq: Every day | ORAL | Status: DC

## 2014-10-13 MED ORDER — CICLOPIROX 8 % EX SOLN: Freq: Every day | CUTANEOUS | Status: DC

## 2014-10-13 MED ORDER — EZETIMIBE 10 MG PO TABS: 10.0000 mg | ORAL_TABLET | Freq: Every day | ORAL | Status: DC

## 2014-10-13 MED ORDER — METOPROLOL SUCCINATE ER 25 MG PO TB24: 25.0000 mg | ORAL_TABLET | Freq: Every day | ORAL | Status: DC

## 2014-10-13 MED ORDER — ATORVASTATIN CALCIUM 40 MG PO TABS: ORAL_TABLET | ORAL | Status: DC

## 2014-10-13 NOTE — Progress Notes (Signed)
   Subjective:    Patient ID: Clarence Ware, male    DOB: 1950/10/23, 64 y.o.   MRN: [id number]  HPI patient interspersed yearly physical. He overall has been doing well. He has no specific complaints today    Review of Systems  Constitutional: Negative.   HENT: Negative.   Eyes: Negative.   Respiratory: Negative.   Cardiovascular: Negative.   Gastrointestinal: Positive for constipation.  Endocrine: Negative.   Genitourinary: Negative.   Musculoskeletal: Positive for arthralgias.  Skin: Negative.   Allergic/Immunologic: Positive for environmental allergies.  Neurological: Negative.   Hematological: Negative.   Psychiatric/Behavioral: Negative.        Objective:   Physical Exam  Constitutional: He appears well-developed and well-nourished.  HENT:  Head: Normocephalic.  Eyes: Pupils are equal, round, and reactive to light.  Neck: Normal range of motion. Neck supple. No thyromegaly present.  Cardiovascular: Normal rate, regular rhythm, normal heart sounds and intact distal pulses.  Exam reveals no gallop and no friction rub.   No murmur heard. Pulmonary/Chest: Effort normal and breath sounds normal. No respiratory distress. He has no wheezes. He has no rales. He exhibits no tenderness.  Abdominal: Soft. Bowel sounds are normal. He exhibits no distension and no mass. There is no tenderness. There is no rebound and no guarding.  Genitourinary:  The lateral portions of the prostate especially on the right are somewhat firm   Meds ordered this encounter  Medications  . atorvastatin (LIPITOR) 40 MG tablet    Sig: Take at night as instucted    Dispense:  90 tablet    Refill:  3  . ezetimibe (ZETIA) 10 MG tablet    Sig: Take 1 tablet (10 mg total) by mouth daily.    Dispense:  90 tablet    Refill:  3  . losartan (COZAAR) 50 MG tablet    Sig: Take 0.5 tablets (25 mg total) by mouth daily.    Dispense:  90 tablet    Refill:  3  . metoprolol succinate (TOPROL-XL) 25 MG 24  hr tablet    Sig: Take 1 tablet (25 mg total) by mouth daily.    Dispense:  90 tablet    Refill:  3  . ciclopirox (PENLAC) 8 % solution    Sig: Apply topically at bedtime. Apply over nail and surrounding skin. Apply daily over previous coat. After seven (7) days, may remove with alcohol and continue cycle.    Dispense:  6.6 mL    Refill:  5         Assessment & Plan:  PSA done today. Referral made to Dr. Diona Fanti for him to check the right side of the prostate. Referral made to cardiology for his family history of early coronary death. Referral made to GI for colonoscopy. I did change his Toprol to the extended release 25 mg.

## 2014-10-14 LAB — VITAMIN D 25 HYDROXY (VIT D DEFICIENCY, FRACTURES): Vit D, 25-Hydroxy: 24 ng/mL — ABNORMAL LOW (ref 30–100)

## 2014-10-14 LAB — PSA, MEDICARE: PSA: 1.65 ng/mL (ref <=4.00)

## 2014-10-28 ENCOUNTER — Encounter: Payer: Self-pay | Admitting: Internal Medicine

## 2014-12-14 ENCOUNTER — Ambulatory Visit (AMBULATORY_SURGERY_CENTER): Payer: Self-pay

## 2014-12-14 VITALS — Ht 74.5 in | Wt 222.8 lb

## 2014-12-14 DIAGNOSIS — Z860100 Personal history of colon polyps, unspecified: Secondary | ICD-10-CM

## 2014-12-14 MED ORDER — POLYETHYLENE GLYCOL 3350 17 GM/SCOOP PO POWD: 1.0000 | Freq: Once | ORAL | Status: DC

## 2014-12-14 NOTE — Progress Notes (Signed)
No allergies to eggs or soy No diet/weight loss meds No past problems with anesthesia (has had cons sed with colonoscopy) No home oxygen  Has email  Emmi instructions given for colonoscopy

## 2014-12-15 ENCOUNTER — Encounter: Payer: Self-pay | Admitting: Internal Medicine

## 2014-12-17 ENCOUNTER — Encounter: Payer: Self-pay | Admitting: Cardiovascular Disease

## 2014-12-17 ENCOUNTER — Ambulatory Visit (INDEPENDENT_AMBULATORY_CARE_PROVIDER_SITE_OTHER): Payer: 59 | Admitting: Cardiovascular Disease

## 2014-12-17 VITALS — BP 130/86 | HR 62 | Ht 74.5 in | Wt 221.3 lb

## 2014-12-17 DIAGNOSIS — E785 Hyperlipidemia, unspecified: Secondary | ICD-10-CM | POA: Diagnosis not present

## 2014-12-17 DIAGNOSIS — Z8249 Family history of ischemic heart disease and other diseases of the circulatory system: Secondary | ICD-10-CM

## 2014-12-17 DIAGNOSIS — I10 Essential (primary) hypertension: Secondary | ICD-10-CM | POA: Diagnosis not present

## 2014-12-17 MED ORDER — ASPIRIN EC 81 MG PO TBEC: 81.0000 mg | DELAYED_RELEASE_TABLET | Freq: Every day | ORAL | Status: DC

## 2014-12-17 NOTE — Patient Instructions (Signed)
Your physician wants you to follow-up in: 1 year or sooner if needed. You will receive a reminder letter in the mail two months in advance. If you don't receive a letter, please call our office to schedule the follow-up appointment.  

## 2014-12-18 ENCOUNTER — Encounter: Payer: Self-pay | Admitting: Cardiovascular Disease

## 2014-12-18 DIAGNOSIS — E785 Hyperlipidemia, unspecified: Secondary | ICD-10-CM | POA: Insufficient documentation

## 2014-12-18 DIAGNOSIS — I10 Essential (primary) hypertension: Secondary | ICD-10-CM | POA: Insufficient documentation

## 2014-12-18 NOTE — Progress Notes (Signed)
Patient ID: Clarence Ware, male   DOB: Mar 17, 1951, 64 y.o.   MRN: [id number]    Primary M.D.: Dr. Ivar Bury  HPI: Clarence Ware is a 64 y.o. male presents to the office today for followup cardiology evaluation.  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 He has a history of hyperlipidemia and hypertension. Approximately 9 years ago he had a normal routine treadmill test. The patient has continued to exercise regularly. There is a prior history of substance abuse for which he did undergo treatment.  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  He  has a history of hyperlipidemia, erectile dysfunction, a history of intermittent headaches.  When I saw him on 11/07/2012, we discussed the sensitivity and specificity of routine treadmill testing with a strong family history I elected to schedule him for a cardiopulmonary met test for further evaluation would also be helpful to assess endothelial dysfunction/diastolic dysfunction evaluate potential for microvascular angina. He also had a soft cardiac murmur at his apex and recommended a 2-D echo Doppler study.  His cardiopulmonary met test revealed that he had good functional capacity with an excellent maximum oxygen consumption peak of 92% of predicted. However, he had a abnormal response during the last several minutes of exercise suggesting ischemic myocardial dysfunction with decreasing stroke volume with increasing work rate which corresponded to an abrupt decrease in cardiac output suggestive of exercise-induced myocardial dysfunction. He did have very mild ventilation/perfusion mismatch with reference to his pulmonary status.  A 2-D echo Doppler study showed normal systolic function with an  ejection fraction of 55-60%. He had normal diastolic parameters.  I saw him in followup of his noninvasive studies in June 2014. In light of his mild abnormality noted during the last 7 minutes of exercise I recommended the addition of ARB therapy and further titrated his atorvastatin for aggressive lipid management. He has tolerated initiation of losartan.  I last saw him on most 2 years ago.  He denied any  chest pain or palpitations. He He did have a followup NMR lipoprotein of which showed an LDL particle number of 1216 with a calculated LDL of 86, HDL 46, triglycerides 58, total cholesterol 144. He had 552 small LDL some particles. HDL particle number was reduced at 29.1. Insulin resistance score was normal at 33.  Since I last saw him, he has continued to be on losartan 25 mg, Toprol-XL 25 mg, atorvastatin 40 mg and aspirin.  He denies any shortness of breath.  He exercises at least 4-5 days per week on elliptical trainer.  He is unaware of palpitations.  He denies exertional dyspnea.  He presents for evaluation.  Past Medical History  Diagnosis Date  . Allergy   . Substance abuse   . Hyperlipidemia   . Hypertension     Past Surgical History  Procedure Laterality Date  . Vasectomy      Allergies  Allergen Reactions  . Nexium [Esomeprazole Magnesium]     Awful regurgitation    Current Outpatient Prescriptions  Medication Sig Dispense Refill  . atorvastatin (LIPITOR) 40 MG tablet Take at night as instucted 90 tablet 3  . ciclopirox (PENLAC) 8 % solution Apply topically at bedtime. Apply over nail and  surrounding skin. Apply daily over previous coat. After seven (7) days, may remove with alcohol and continue cycle. 6.6 mL 5  . ibuprofen (ADVIL) 200 MG tablet Take 1 tablet (200 mg total) by mouth every 6 (six) hours as needed. 100 tablet 3  . loratadine (CLARITIN) 10 MG tablet Take 1 tablet (10 mg total) by mouth daily. (Patient taking differently: Take 10 mg by mouth as needed. ) 90  tablet 3  . losartan (COZAAR) 50 MG tablet Take 0.5 tablets (25 mg total) by mouth daily. 90 tablet 3  . metoprolol succinate (TOPROL-XL) 25 MG 24 hr tablet Take 1 tablet (25 mg total) by mouth daily. 90 tablet 3  . metoprolol succinate (TOPROL-XL) 25 MG 24 hr tablet Take 25 mg by mouth daily.    . polyethylene glycol powder (MIRALAX) powder Take 255 g by mouth once. 238 g 0  . tadalafil (CIALIS) 20 MG tablet Use as directed. 6 tablet 11  . Vitamin D, Cholecalciferol, 1000 UNITS CAPS Take by mouth.    Marland Kitchen aspirin EC 81 MG tablet Take 1 tablet (81 mg total) by mouth daily. 90 tablet 3   No current facility-administered medications for this visit.    Socially he is married has 2 children no grandchildren. He does exercise at least 30 minutes at a time. There is a prior history of tobacco use but he quit over 3 years ago. Has a prior use of opiates. 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  ROS General: Negative; No fevers, chills, or night sweats;  HEENT: Negative; No changes in vision or hearing, sinus congestion, difficulty swallowing Pulmonary: Negative; No cough, wheezing, shortness of breath, hemoptysis Cardiovascular: Negative; No chest pain, presyncope, syncope, palpitations GI: Negative; No nausea, vomiting, diarrhea, or abdominal pain GU: Negative; No dysuria, hematuria, or difficulty voiding Musculoskeletal: Negative; no myalgias, joint pain, or weakness Hematologic/Oncology: Negative; no easy bruising, bleeding Endocrine: Negative; no heat/cold intolerance; no diabetes Neuro: Negative; no changes in balance, headaches Skin: Negative; No rashes or skin lesions Psychiatric: Negative; No behavioral problems, depression Sleep: Negative; No snoring, daytime sleepiness, hypersomnolence, bruxism, restless legs, hypnogognic hallucinations, no cataplexy Other comprehensive 14 point system review is  negative.  PE BP 130/86 mmHg  Pulse 62  Ht 6' 2.5" (1.892 m)  Wt 100.381 kg (221 lb 4.8 oz)  BMI 28.04 kg/m2  Repeat blood pressure by me was 122/80  Wt Readings from Last 3 Encounters:  12/17/14 100.381 kg (221 lb 4.8 oz)  12/14/14 101.061 kg (222 lb 12.8 oz)  10/13/14 99.428 kg (219 lb 3.2 oz)   General: Alert, oriented, no distress.  Skin: normal turgor, no rashes HEENT: Normocephalic, atraumatic. Pupils round and reactive; sclera anicteric;no lid lag.  Nose without nasal septal hypertrophy Mouth/Parynx benign; Mallinpatti scale 2 Neck: No JVD, no carotid bruits with normal carotid upstroke Chest wall: Nontender to palpation Lungs: clear to ausculatation and percussion; no wheezing or rales Heart: RRR, s1 s2 normal 1/6 apical systolic murmur Abdomen: soft, nontender; no hepatosplenomehaly, BS+; abdominal aorta nontender and not dilated by palpation. Back: No CVA tenderness Pulses 2+ Extremities: no clubbing cyanosis or edema, Homan's sign negative  Neurologic: grossly nonfocal Psychcological: Normal affect and mood  ECG (independently read by me): Sinus rhythm with first-degree AV block.  PR interval 264 ms.  QTc interval normal 381 ms.  ECG: Sinus bradycardia for the first degree AV block. PR interval  220 ms. QT interval 393 ms.  LABS: BMP Latest Ref Rng 10/13/2014 12/16/2013 03/03/2013  Glucose 70 - 99 mg/dL 81 86 100(H)  BUN 6 - 23 mg/dL 16 26(H) 19  Creatinine 0.50 - 1.35 mg/dL 0.91 1.17 1.03  Sodium 135 - 145 mEq/L 135 138 139  Potassium 3.5 - 5.3 mEq/L 4.1 4.3 4.1  Chloride 96 - 112 mEq/L 101 105 105  CO2 19 - 32 mEq/L $Remove'25 24 26  'aOIAKyD$ Calcium 8.4 - 10.5 mg/dL 9.1 9.3 9.2   Hepatic Function Latest Ref Rng 10/13/2014 12/16/2013 09/24/2012  Total Protein 6.0 - 8.3 g/dL 6.6 6.6 7.0  Albumin 3.5 - 5.2 g/dL 4.4 4.2 4.7  AST 0 - 37 U/L $Remo'18 22 20  'VQixb$ ALT 0 - 53 U/L $Remo'18 22 23  'PEILF$ Alk Phosphatase 39 - 117 U/L 82 73 86  Total Bilirubin 0.2 - 1.2 mg/dL 0.5 0.8 0.8   CBC Latest Ref Rng  10/13/2014 12/16/2013 09/24/2012  WBC 4.0 - 10.5 K/uL 7.2 8.6 8.5  Hemoglobin 13.0 - 17.0 g/dL 14.5 14.6 15.8  Hematocrit 39.0 - 52.0 % 42.6 41.2 47.8  Platelets 150 - 400 K/uL 222 208 -   Lab Results  Component Value Date   MCV 87.5 10/13/2014   MCV 85.7 12/16/2013   MCV 91.7 09/24/2012   Lab Results  Component Value Date   TSH 1.822 10/13/2014  No results found for: HGBA1C   Lipid Panel     Component Value Date/Time   CHOL 144 10/13/2014 1607   CHOL 144 03/03/2013 0855   TRIG 44 10/13/2014 1607   TRIG 58 03/03/2013 0855   HDL 47 10/13/2014 1607   HDL 46 03/03/2013 0855   CHOLHDL 3.1 10/13/2014 1607   VLDL 9 10/13/2014 1607   LDLCALC 88 10/13/2014 1607   LDLCALC 86 03/03/2013 0855     RADIOLOGY: No results found.    ASSESSMENT AND PLAN: Mr. Dakhari Zuver  is a 64 year old white male remains asymptomatic with reference to his cardiac status.  His blood pressure today is stable at 122/80 when checked by me on his current regimen of Toprol-XL 25 mg in addition to losartan 25 mg.  He also is on lipid lowering therapy with atorvastatin 40 mg.  His previous  cardiopulmonary met test did demonstrate an asymptomatic ischemic response to the latter phase of exercise. He did have good maximum oxygen consumption. He  is tolerating ARB therapy. 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  Reviewed recent laboratory.  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  We discussed exercise prescription.  His weight is stable.  I have recommended he reduce his aspirin to 81 mg.  He will return to the primary care of Dr. Everlene Farrier.  I'll see him in one year for reevaluation.  Time spent: 25 minutes  Troy Sine, MD, [facility]  12/18/2014 8:28 PM

## 2015-01-04 ENCOUNTER — Encounter: Payer: Self-pay | Admitting: Internal Medicine

## 2015-01-14 ENCOUNTER — Encounter: Payer: Self-pay | Admitting: Internal Medicine

## 2015-02-10 ENCOUNTER — Ambulatory Visit: Payer: Self-pay | Admitting: Surgery

## 2015-02-10 NOTE — H&P (Signed)
Chief Complaint:  Right inguinal hernia  History of Present Illness:  Clarence Ware is an 64 y.o. male referred by Dr. Everlene Farrier with a symptomatic RIH.  I saw him for this in June 2014 but it wasn't bothering him that much.  It has grown more symptomatic and he presents for repair.    Past Medical History  Diagnosis Date  . Allergy   . Substance abuse   . Hyperlipidemia   . Hypertension     Past Surgical History  Procedure Laterality Date  . Vasectomy      Current Outpatient Prescriptions  Medication Sig Dispense Refill  . aspirin EC 81 MG tablet Take 1 tablet (81 mg total) by mouth daily. 90 tablet 3  . atorvastatin (LIPITOR) 40 MG tablet Take at night as instucted 90 tablet 3  . ciclopirox (PENLAC) 8 % solution Apply topically at bedtime. Apply over nail and surrounding skin. Apply daily over previous coat. After seven (7) days, may remove with alcohol and continue cycle. 6.6 mL 5  . ibuprofen (ADVIL) 200 MG tablet Take 1 tablet (200 mg total) by mouth every 6 (six) hours as needed. 100 tablet 3  . loratadine (CLARITIN) 10 MG tablet Take 1 tablet (10 mg total) by mouth daily. (Patient taking differently: Take 10 mg by mouth as needed. ) 90 tablet 3  . losartan (COZAAR) 50 MG tablet Take 0.5 tablets (25 mg total) by mouth daily. 90 tablet 3  . metoprolol succinate (TOPROL-XL) 25 MG 24 hr tablet Take 1 tablet (25 mg total) by mouth daily. 90 tablet 3  . metoprolol succinate (TOPROL-XL) 25 MG 24 hr tablet Take 25 mg by mouth daily.    . polyethylene glycol powder (MIRALAX) powder Take 255 g by mouth once. 238 g 0  . tadalafil (CIALIS) 20 MG tablet Use as directed. 6 tablet 11  . Vitamin D, Cholecalciferol, 1000 UNITS CAPS Take by mouth.     No current facility-administered medications for this visit.   Nexium Family History  Problem Relation Age of Onset  . Heart disease Mother   . Stroke Mother   . Heart disease Father 36    MI  . Hypertension Father   . Hypertension Brother    . Diabetes Brother   . Heart disease Brother   . Hyperlipidemia Brother   . Cancer Brother   . Coronary artery disease Brother 20    CABG  . Colon cancer Paternal Grandmother    Social History:   reports that he quit smoking about 22 years ago. He has never used smokeless tobacco. He reports that he does not drink alcohol or use illicit drugs.   REVIEW OF SYSTEMS : Negative except for see above in problem list  Physical Exam:   BP 130/84;  P 78 and regular.  afebrile, weight 219.  There is no weight on file to calculate BMI.  Gen:  WDWN WM NAD  Neurological: Alert and oriented to person, place, and time. Motor and sensory function is grossly intact  Head: Normocephalic and atraumatic.  Eyes: Conjunctivae are normal. Pupils are equal, round, and reactive to light. No scleral icterus.  Neck: Normal range of motion. Neck supple. No tracheal deviation or thyromegaly present.  Cardiovascular:  SR without murmurs or gallops.  No carotid bruits Breast:  Not examined  Respiratory: Effort normal.  No respiratory distress. No chest wall tenderness. Breath sounds normal.  No wheezes, rales or rhonchi.  Abdomen:  nontender GU:  Right inguinal hernia; no  left inguinal hernia.  Testes normal.   Musculoskeletal: Normal range of motion. Extremities are nontender. No cyanosis, edema or clubbing noted Lymphadenopathy: No cervical, preauricular, postauricular or axillary adenopathy is present Skin: Skin is warm and dry. No rash noted. No diaphoresis. No erythema. No pallor. Pscyh: Normal mood and affect. Behavior is normal. Judgment and thought content normal.   LABORATORY RESULTS: No results found for this or any previous visit (from the past 48 hour(s)).   RADIOLOGY RESULTS: No results found.  Problem List: Patient Active Problem List   Diagnosis Date Noted  . Essential hypertension 12/18/2014  . Hyperlipidemia 12/18/2014  . First degree heart block 03/30/2013  . Smoking hx 03/07/2013   . Family history of coronary artery disease 03/07/2013  . Inguinal hernia, right 01/03/2013  . IMPAIRED FASTING GLUCOSE 06/30/2010  . NECK PAIN 02/01/2009  . SPRAIN&STRAIN OTHER SPECIFIED SITES KNEE&LEG 02/01/2009  . TENSION TYPE HEADACHE UNSPECIFIED 06/26/2008  . GERD 06/26/2008  . NEOPLASM OF UNCERTAIN BEHAVIOR OF SKIN 02/27/2008  . ERECTILE DYSFUNCTION, MILD 01/30/2008  . VENTRICULAR HYPERTROPHY, LEFT 11/29/2006  . COLON POLYP 09/06/2006  . HYPERLIPIDEMIA 09/06/2006  . HYPERTENSION, BENIGN SYSTEMIC 09/06/2006  . ASTHMA, EXERCISE INDUCED 09/06/2006    Assessment & Plan: Symptomatic right inguinal hernia    Matt B. Hassell Done, MD, [facility] [phone] beeper [phone]  02/10/2015 3:11 PM

## 2015-02-24 NOTE — Patient Instructions (Addendum)
Clarence Ware  02/24/2015   Your procedure is scheduled on:   03/10/2015    Report to [facility] Main  Entrance take [place]  elevators to 3rd floor to  [place] at     0700 AM.  Call this number if you have problems the morning of surgery [phone]   Remember: ONLY 1 PERSON MAY GO WITH YOU TO SHORT STAY TO GET  READY MORNING OF [name].  Do not eat food or drink liquids :After Midnight.            Fleets enema nite before surgery      Take these medicines the morning of surgery with A SIP OF WATER:   Claritin if needed, Toprol ( Metorprolol)                               You may not have any metal on your body including hair pins and              piercings  Do not wear jewelry,  lotions, powders or perfumes, deodorant                           Men may shave face and neck.   Do not bring valuables to the hospital. [place].  Contacts, dentures or bridgework may not be worn into surgery.       Patients discharged the day of surgery will not be allowed to drive home.  Name and phone number of your driver:  Special Instructions: coughing and deep breathing exercises, leg exercises               Please read over the following fact sheets you were given: _____________________________________________________________________             [facility] - Preparing for Surgery Before surgery, you can play an important role.  Because skin is not sterile, your skin needs to be as free of germs as possible.  You can reduce the number of germs on your skin by washing with CHG (chlorahexidine gluconate) soap before surgery.  CHG is an antiseptic cleaner which kills germs and bonds with the skin to continue killing germs even after washing. Please DO NOT use if you have an allergy to CHG or antibacterial soaps.  If your skin becomes reddened/irritated stop using the CHG and inform your nurse when you arrive  at Short Stay. Do not shave (including legs and underarms) for at least 48 hours prior to the first CHG shower.  You may shave your face/neck. Please follow these instructions carefully:  1.  Shower with CHG Soap the night before surgery and the  morning of Surgery.  2.  If you choose to wash your hair, wash your hair first as usual with your  normal  shampoo.  3.  After you shampoo, rinse your hair and body thoroughly to remove the  shampoo.                           4.  Use CHG as you would any other liquid soap.  You can apply chg directly  to the skin and wash  Gently with a scrungie or clean washcloth.  5.  Apply the CHG Soap to your body ONLY FROM THE NECK DOWN.   Do not use on face/ open                           Wound or open sores. Avoid contact with eyes, ears mouth and genitals (private parts).                       Wash face,  Genitals (private parts) with your normal soap.             6.  Wash thoroughly, paying special attention to the area where your surgery  will be performed.  7.  Thoroughly rinse your body with warm water from the neck down.  8.  DO NOT shower/wash with your normal soap after using and rinsing off  the CHG Soap.                9.  Pat yourself dry with a clean towel.            10.  Wear clean pajamas.            11.  Place clean sheets on your bed the night of your first shower and do not  sleep with pets. Day of Surgery : Do not apply any lotions/deodorants the morning of surgery.  Please wear clean clothes to the hospital/surgery center.  FAILURE TO FOLLOW THESE INSTRUCTIONS MAY RESULT IN THE CANCELLATION OF YOUR SURGERY PATIENT SIGNATURE_________________________________  NURSE SIGNATURE__________________________________  ________________________________________________________________________

## 2015-02-26 ENCOUNTER — Encounter (HOSPITAL_COMMUNITY)
Admission: RE | Admit: 2015-02-26 | Discharge: 2015-02-26 | Disposition: A | Discharge status: Home / Self Care | Payer: 59 | Source: Ambulatory Visit | Attending: Surgery | Admitting: Surgery

## 2015-02-26 ENCOUNTER — Encounter (HOSPITAL_COMMUNITY): Payer: Self-pay

## 2015-02-26 DIAGNOSIS — Z01818 Encounter for other preprocedural examination: Secondary | ICD-10-CM | POA: Insufficient documentation

## 2015-02-26 LAB — CBC
HEMATOCRIT: 42.9 % (ref 39.0–52.0)
HEMOGLOBIN: 14.5 g/dL (ref 13.0–17.0)
MCH: 30.2 pg (ref 26.0–34.0)
MCHC: 33.8 g/dL (ref 30.0–36.0)
MCV: 89.4 fL (ref 78.0–100.0)
Platelets: 207 10*3/uL (ref 150–400)
RBC: 4.8 MIL/uL (ref 4.22–5.81)
RDW: 12.7 % (ref 11.5–15.5)
WBC: 6.2 10*3/uL (ref 4.0–10.5)

## 2015-02-26 LAB — BASIC METABOLIC PANEL
Anion gap: 9 (ref 5–15)
BUN: 23 mg/dL — ABNORMAL HIGH (ref 6–20)
CHLORIDE: 106 mmol/L (ref 101–111)
CO2: 25 mmol/L (ref 22–32)
CREATININE: 0.92 mg/dL (ref 0.61–1.24)
Calcium: 9.7 mg/dL (ref 8.9–10.3)
GFR calc non Af Amer: >60 mL/min (ref >60)
Glucose, Bld: 99 mg/dL (ref 65–99)
POTASSIUM: 3.9 mmol/L (ref 3.5–5.1)
SODIUM: 140 mmol/L (ref 135–145)

## 2015-02-26 NOTE — Progress Notes (Signed)
BMP results done 02/26/15 faxed via EPIC to Dr Hassell Done.

## 2015-02-26 NOTE — Progress Notes (Signed)
EKG- 12/17/14 EPIC  [id number] EPIC  12/2012 PFT  12/17/14- LOV with Dr Claiborne Billings ( cardiologist)

## 2015-03-09 NOTE — Anesthesia Preprocedure Evaluation (Signed)
Anesthesia Evaluation  Patient identified by MRN, date of birth, ID band Patient awake    Reviewed: Allergy & Precautions, H&P , NPO status , Patient's Chart, lab work & pertinent test results, reviewed documented beta blocker date and time   Airway Mallampati: II  TM Distance: >3 FB Neck ROM: full    Dental no notable dental hx. (+) Dental Advisory Given   Pulmonary asthma , former smoker,  Exercise induced asthma breath sounds clear to auscultation  Pulmonary exam normal       Cardiovascular Exercise Tolerance: Good hypertension, Pt. on medications and Pt. on home beta blockers negative cardio ROS Normal cardiovascular examRhythm:regular Rate:Normal  LVH   Neuro/Psych negative neurological ROS  negative psych ROS   GI/Hepatic negative GI ROS, Neg liver ROS,   Endo/Other  negative endocrine ROS  Renal/GU negative Renal ROS  negative genitourinary   Musculoskeletal   Abdominal   Peds  Hematology negative hematology ROS (+)   Anesthesia Other Findings   Reproductive/Obstetrics negative OB ROS                             Anesthesia Physical Anesthesia Plan  ASA: II  Anesthesia Plan: General   Post-op Pain Management:    Induction: Intravenous  Airway Management Planned: LMA  Additional Equipment:   Intra-op Plan:   Post-operative Plan:   Informed Consent: I have reviewed the patients History and Physical, chart, labs and discussed the procedure including the risks, benefits and alternatives for the proposed anesthesia with the patient or authorized representative who has indicated his/her understanding and acceptance.   Dental Advisory Given  Plan Discussed with: CRNA and Surgeon  Anesthesia Plan Comments:         Anesthesia Quick Evaluation

## 2015-03-10 ENCOUNTER — Encounter (HOSPITAL_COMMUNITY)
Admission: RE | Disposition: A | Discharge status: Home / Self Care | Payer: Self-pay | Source: Ambulatory Visit | Attending: Surgery

## 2015-03-10 ENCOUNTER — Ambulatory Visit (HOSPITAL_COMMUNITY): Payer: 59 | Admitting: Certified Registered Nurse Anesthetist

## 2015-03-10 ENCOUNTER — Ambulatory Visit (HOSPITAL_COMMUNITY)
Admission: RE | Admit: 2015-03-10 | Discharge: 2015-03-10 | Disposition: A | Discharge status: Home / Self Care | Payer: 59 | Source: Ambulatory Visit | Attending: Surgery | Admitting: Surgery

## 2015-03-10 ENCOUNTER — Encounter (HOSPITAL_COMMUNITY): Payer: Self-pay | Admitting: *Deleted

## 2015-03-10 DIAGNOSIS — Z87891 Personal history of nicotine dependence: Secondary | ICD-10-CM | POA: Diagnosis not present

## 2015-03-10 DIAGNOSIS — I10 Essential (primary) hypertension: Secondary | ICD-10-CM | POA: Diagnosis not present

## 2015-03-10 DIAGNOSIS — Z79899 Other long term (current) drug therapy: Secondary | ICD-10-CM | POA: Insufficient documentation

## 2015-03-10 DIAGNOSIS — N529 Male erectile dysfunction, unspecified: Secondary | ICD-10-CM | POA: Diagnosis not present

## 2015-03-10 DIAGNOSIS — K4090 Unilateral inguinal hernia, without obstruction or gangrene, not specified as recurrent: Secondary | ICD-10-CM | POA: Diagnosis not present

## 2015-03-10 DIAGNOSIS — Z7982 Long term (current) use of aspirin: Secondary | ICD-10-CM | POA: Insufficient documentation

## 2015-03-10 DIAGNOSIS — K219 Gastro-esophageal reflux disease without esophagitis: Secondary | ICD-10-CM | POA: Insufficient documentation

## 2015-03-10 DIAGNOSIS — E785 Hyperlipidemia, unspecified: Secondary | ICD-10-CM | POA: Diagnosis not present

## 2015-03-10 HISTORY — PX: INGUINAL HERNIA REPAIR: SHX194

## 2015-03-10 SURGERY — REPAIR, HERNIA, INGUINAL, ADULT
Anesthesia: General | Site: Groin | Laterality: Right

## 2015-03-10 MED ORDER — LACTATED RINGERS IV SOLN: INTRAVENOUS | Status: DC

## 2015-03-10 MED ORDER — MIDAZOLAM HCL 5 MG/5ML IJ SOLN
INTRAMUSCULAR | Status: DC | PRN
  Administered 2015-03-10: 2 mg via INTRAVENOUS

## 2015-03-10 MED ORDER — BUPIVACAINE LIPOSOME 1.3 % IJ SUSP
20.0000 mL | Freq: Once | INTRAMUSCULAR | Status: DC
  Filled 2015-03-10: qty 20

## 2015-03-10 MED ORDER — CHLORHEXIDINE GLUCONATE 4 % EX LIQD: 1.0000 "application " | Freq: Once | CUTANEOUS | Status: DC

## 2015-03-10 MED ORDER — FENTANYL CITRATE (PF) 100 MCG/2ML IJ SOLN
INTRAMUSCULAR | Status: DC | PRN
  Administered 2015-03-10 (×4): 50 ug via INTRAVENOUS

## 2015-03-10 MED ORDER — BUPIVACAINE-EPINEPHRINE (PF) 0.25% -1:200000 IJ SOLN
INTRAMUSCULAR | Status: AC
  Filled 2015-03-10: qty 30

## 2015-03-10 MED ORDER — OXYCODONE HCL 5 MG PO TABS
5.0000 mg | ORAL_TABLET | ORAL | Status: DC | PRN
  Administered 2015-03-10: 5 mg via ORAL
  Filled 2015-03-10: qty 1

## 2015-03-10 MED ORDER — HEPARIN SODIUM (PORCINE) 5000 UNIT/ML IJ SOLN
5000.0000 [IU] | Freq: Once | INTRAMUSCULAR | Status: AC
  Administered 2015-03-10: 5000 [IU] via SUBCUTANEOUS
  Filled 2015-03-10: qty 1

## 2015-03-10 MED ORDER — SODIUM CHLORIDE 0.9 % IJ SOLN
INTRAMUSCULAR | Status: DC | PRN
  Administered 2015-03-10: 10 mL

## 2015-03-10 MED ORDER — PROPOFOL 10 MG/ML IV BOLUS
INTRAVENOUS | Status: DC | PRN
  Administered 2015-03-10: 200 mg via INTRAVENOUS

## 2015-03-10 MED ORDER — CEFAZOLIN SODIUM-DEXTROSE 2-3 GM-% IV SOLR
2.0000 g | INTRAVENOUS | Status: AC
  Administered 2015-03-10: 2 g via INTRAVENOUS

## 2015-03-10 MED ORDER — EPHEDRINE SULFATE 50 MG/ML IJ SOLN
INTRAMUSCULAR | Status: AC
  Filled 2015-03-10: qty 1

## 2015-03-10 MED ORDER — KETOROLAC TROMETHAMINE 30 MG/ML IJ SOLN
INTRAMUSCULAR | Status: AC
  Administered 2015-03-10: 30 mg via INTRAVENOUS
  Filled 2015-03-10: qty 1

## 2015-03-10 MED ORDER — PROPOFOL 10 MG/ML IV BOLUS
INTRAVENOUS | Status: AC
  Filled 2015-03-10: qty 20

## 2015-03-10 MED ORDER — PHENYLEPHRINE HCL 10 MG/ML IJ SOLN
INTRAMUSCULAR | Status: DC | PRN
  Administered 2015-03-10 (×4): 40 ug via INTRAVENOUS

## 2015-03-10 MED ORDER — DEXAMETHASONE SODIUM PHOSPHATE 10 MG/ML IJ SOLN
INTRAMUSCULAR | Status: AC
  Filled 2015-03-10: qty 1

## 2015-03-10 MED ORDER — ONDANSETRON HCL 4 MG/2ML IJ SOLN
INTRAMUSCULAR | Status: DC | PRN
  Administered 2015-03-10: 4 mg via INTRAVENOUS

## 2015-03-10 MED ORDER — ACETAMINOPHEN 325 MG PO TABS: 650.0000 mg | ORAL_TABLET | ORAL | Status: DC | PRN

## 2015-03-10 MED ORDER — SODIUM CHLORIDE 0.9 % IJ SOLN
INTRAMUSCULAR | Status: AC
  Filled 2015-03-10: qty 10

## 2015-03-10 MED ORDER — FENTANYL CITRATE (PF) 250 MCG/5ML IJ SOLN
INTRAMUSCULAR | Status: AC
  Filled 2015-03-10: qty 25

## 2015-03-10 MED ORDER — LIDOCAINE HCL (CARDIAC) 20 MG/ML IV SOLN
INTRAVENOUS | Status: AC
  Filled 2015-03-10: qty 5

## 2015-03-10 MED ORDER — EPHEDRINE SULFATE 50 MG/ML IJ SOLN
INTRAMUSCULAR | Status: DC | PRN
  Administered 2015-03-10 (×3): 5 mg via INTRAVENOUS

## 2015-03-10 MED ORDER — KETOROLAC TROMETHAMINE 30 MG/ML IJ SOLN
30.0000 mg | Freq: Once | INTRAMUSCULAR | Status: AC
  Administered 2015-03-10: 30 mg via INTRAVENOUS

## 2015-03-10 MED ORDER — LACTATED RINGERS IV SOLN
INTRAVENOUS | Status: DC | PRN
  Administered 2015-03-10 (×2): via INTRAVENOUS

## 2015-03-10 MED ORDER — ONDANSETRON HCL 4 MG/2ML IJ SOLN
INTRAMUSCULAR | Status: AC
  Filled 2015-03-10: qty 2

## 2015-03-10 MED ORDER — 0.9 % SODIUM CHLORIDE (POUR BTL) OPTIME
TOPICAL | Status: DC | PRN
  Administered 2015-03-10: 1000 mL

## 2015-03-10 MED ORDER — DEXAMETHASONE SODIUM PHOSPHATE 10 MG/ML IJ SOLN
INTRAMUSCULAR | Status: DC | PRN
  Administered 2015-03-10: 10 mg via INTRAVENOUS

## 2015-03-10 MED ORDER — ACETAMINOPHEN 650 MG RE SUPP
650.0000 mg | RECTAL | Status: DC | PRN
  Filled 2015-03-10: qty 1

## 2015-03-10 MED ORDER — CEFAZOLIN SODIUM-DEXTROSE 2-3 GM-% IV SOLR
INTRAVENOUS | Status: AC
  Filled 2015-03-10: qty 50

## 2015-03-10 MED ORDER — BUPIVACAINE LIPOSOME 1.3 % IJ SUSP
INTRAMUSCULAR | Status: DC | PRN
  Administered 2015-03-10: 20 mL

## 2015-03-10 MED ORDER — MIDAZOLAM HCL 2 MG/2ML IJ SOLN
INTRAMUSCULAR | Status: AC
  Filled 2015-03-10: qty 4

## 2015-03-10 MED ORDER — OXYCODONE HCL 5 MG PO TABS: 5.0000 mg | ORAL_TABLET | ORAL | Status: DC | PRN

## 2015-03-10 MED ORDER — LIDOCAINE HCL (CARDIAC) 20 MG/ML IV SOLN
INTRAVENOUS | Status: DC | PRN
  Administered 2015-03-10: 100 mg via INTRAVENOUS

## 2015-03-10 SURGICAL SUPPLY — 36 items
APL SKNCLS STERI-STRIP NONHPOA (GAUZE/BANDAGES/DRESSINGS)
BENZOIN TINCTURE PRP APPL 2/3 (GAUZE/BANDAGES/DRESSINGS) IMPLANT
BLADE HEX COATED 2.75 (ELECTRODE) ×3 IMPLANT
BLADE SURG 15 STRL LF DISP TIS (BLADE) ×1 IMPLANT
BLADE SURG 15 STRL SS (BLADE) ×3
CLOSURE WOUND 1/2 X4 (GAUZE/BANDAGES/DRESSINGS)
COVER SURGICAL LIGHT HANDLE (MISCELLANEOUS) ×3 IMPLANT
DECANTER SPIKE VIAL GLASS SM (MISCELLANEOUS) ×3 IMPLANT
DISSECTOR ROUND CHERRY 3/8 STR (MISCELLANEOUS) IMPLANT
DRAIN PENROSE 18X1/2 LTX STRL (DRAIN) ×3 IMPLANT
DRAPE LAPAROTOMY TRNSV 102X78 (DRAPE) ×3 IMPLANT
ELECT PENCIL ROCKER SW 15FT (MISCELLANEOUS) ×3 IMPLANT
ELECT REM PT RETURN 9FT ADLT (ELECTROSURGICAL) ×3
ELECTRODE REM PT RTRN 9FT ADLT (ELECTROSURGICAL) ×1 IMPLANT
GLOVE BIOGEL M 8.0 STRL (GLOVE) ×3 IMPLANT
GOWN STRL REUS W/TWL XL LVL3 (GOWN DISPOSABLE) ×6 IMPLANT
KIT BASIN OR (CUSTOM PROCEDURE TRAY) ×7 IMPLANT
LIQUID BAND (GAUZE/BANDAGES/DRESSINGS) ×2 IMPLANT
MESH HERNIA 3X6 (Mesh General) ×2 IMPLANT
NEEDLE HYPO 22GX1.5 SAFETY (NEEDLE) ×3 IMPLANT
PACK BASIC VI WITH GOWN DISP (CUSTOM PROCEDURE TRAY) ×3 IMPLANT
SPONGE LAP 4X18 X RAY DECT (DISPOSABLE) ×3 IMPLANT
STAPLER VISISTAT 35W (STAPLE) IMPLANT
STRIP CLOSURE SKIN 1/2X4 (GAUZE/BANDAGES/DRESSINGS) IMPLANT
SUT MON AB 5-0 PS2 18 (SUTURE) ×2 IMPLANT
SUT PROLENE 2 0 CT2 30 (SUTURE) ×10 IMPLANT
SUT SILK 2 0 SH (SUTURE) IMPLANT
SUT VIC AB 2-0 SH 27 (SUTURE) ×6
SUT VIC AB 2-0 SH 27X BRD (SUTURE) ×1 IMPLANT
SUT VIC AB 4-0 SH 18 (SUTURE) ×3 IMPLANT
SUT VICRYL 4-0 (SUTURE) ×3 IMPLANT
SYR 20CC LL (SYRINGE) ×3 IMPLANT
SYR BULB IRRIGATION 50ML (SYRINGE) ×3 IMPLANT
TOWEL OR 17X26 10 PK STRL BLUE (TOWEL DISPOSABLE) ×3 IMPLANT
TOWEL OR NON WOVEN STRL DISP B (DISPOSABLE) ×3 IMPLANT
YANKAUER SUCT BULB TIP 10FT TU (MISCELLANEOUS) ×3 IMPLANT

## 2015-03-10 NOTE — Op Note (Signed)
NAME:  Clarence Ware, Clarence Ware NO.:  [id number]  MEDICAL RECORD NO.:  [id number]  LOCATION:  [facility]                         FACILITY:  [facility]  PHYSICIAN:  Isabel Caprice. Hassell Done, MD  DATE OF BIRTH:  [birth year]  DATE OF PROCEDURE:  03/10/2015 DATE OF DISCHARGE:  03/10/2015                              OPERATIVE REPORT   PREOPERATIVE DIAGNOSIS:  Right inguinal hernia.  POSTOPERATIVE DIAGNOSIS:  Right indirect inguinal hernia.  PROCEDURE:  Right inguinal herniorrhaphy with Marlex-type mesh, open.  DESCRIPTION OF PROCEDURE:  [name] was taken to room 4 at [facility] and given general anesthesia.  The abdomen was clipped and then prepped with PCMX including his genitalia and draped sterilely. Previously, we had marked this right side.  A small oblique incision was made and carried down to the adipose tissue of the external oblique.  I incised the external oblique along the fibers into the external ring.  I mobilized the cord and put a Penrose around it.  The floor immediately felt intact.  Proximally, there was a bulge and slightly dilated ring. Anteromedially I dissected free a sac, which had a lot of fat on the side.  When I had this dissected out, I opened it and I was able to see that the sac kind of went in and then had a neck that contained a sliding component of fat sac.  I exteriorized this and this was coming down from more superiorly, but it did not seem to be the cecum.  I palpated inside and felt the floor and felt the femoral canal and everything else appeared to be intact.  I elected to go ahead and close the sac recognizing the sliding component and keeping that inside the abdomen as I did a pursestring of 2-0 Vicryl around this and then we twisted it and then ligated it and then cut off the excess sac.  It was reduced.  The ring was then repaired medially with a figure-of-eight suture of 2-0 Prolene.  This was sutured to near the inguinal ligament up into the  transversalis fascia to snug up the ring to where you could barely put the tip of your finger in.  Next, I cut a piece of Marlex mesh to fit and sutured along the inguinal ligament medially to Cooper's with a running 2-0 Prolene.  Medially, I sutured it up to the internal oblique and then I brought it around laterally and sutured it to itself around the cord structures.  These were tucked beneath the external oblique.  The nerve had been preserved and laid back down on top of this.  I then infiltrated the entire area with 30 mL of Exparel that had been diluted from its original 20 mL. The external oblique was then closed with running 2-0 Vicryl.  The wound was then closed in layers with 4-0 Vicryl and then with a running subcuticular 5-0 Monocryl on the subcuticular layer with LiquiBand on the skin.  The patient tolerated the procedure well, was taken to the recovery room in a satisfactory condition.     Isabel Caprice Hassell Done, MD    MBM/MEDQ  D:  03/10/2015  T:  03/10/2015  Job:  [id number]

## 2015-03-10 NOTE — H&P (View-Only) (Signed)
Chief Complaint:  Right inguinal hernia  History of Present Illness:  Clarence Ware is an 63 y.o. male referred by Dr. Daub with a symptomatic RIH.  I saw him for this in June 2014 but it wasn't bothering him that much.  It has grown more symptomatic and he presents for repair.    Past Medical History  Diagnosis Date  . Allergy   . Substance abuse   . Hyperlipidemia   . Hypertension     Past Surgical History  Procedure Laterality Date  . Vasectomy      Current Outpatient Prescriptions  Medication Sig Dispense Refill  . aspirin EC 81 MG tablet Take 1 tablet (81 mg total) by mouth daily. 90 tablet 3  . atorvastatin (LIPITOR) 40 MG tablet Take at night as instucted 90 tablet 3  . ciclopirox (PENLAC) 8 % solution Apply topically at bedtime. Apply over nail and surrounding skin. Apply daily over previous coat. After seven (7) days, may remove with alcohol and continue cycle. 6.6 mL 5  . ibuprofen (ADVIL) 200 MG tablet Take 1 tablet (200 mg total) by mouth every 6 (six) hours as needed. 100 tablet 3  . loratadine (CLARITIN) 10 MG tablet Take 1 tablet (10 mg total) by mouth daily. (Patient taking differently: Take 10 mg by mouth as needed. ) 90 tablet 3  . losartan (COZAAR) 50 MG tablet Take 0.5 tablets (25 mg total) by mouth daily. 90 tablet 3  . metoprolol succinate (TOPROL-XL) 25 MG 24 hr tablet Take 1 tablet (25 mg total) by mouth daily. 90 tablet 3  . metoprolol succinate (TOPROL-XL) 25 MG 24 hr tablet Take 25 mg by mouth daily.    . polyethylene glycol powder (MIRALAX) powder Take 255 g by mouth once. 238 g 0  . tadalafil (CIALIS) 20 MG tablet Use as directed. 6 tablet 11  . Vitamin D, Cholecalciferol, 1000 UNITS CAPS Take by mouth.     No current facility-administered medications for this visit.   [place] Family History  Problem Relation Age of Onset  . Heart disease Mother   . Stroke Mother   . Heart disease Father 51    MI  . Hypertension Father   . Hypertension Brother    . Diabetes Brother   . Heart disease Brother   . Hyperlipidemia Brother   . Cancer Brother   . Coronary artery disease Brother 65    CABG  . Colon cancer Paternal Grandmother    Social History:   reports that he quit smoking about 22 years ago. He has never used smokeless tobacco. He reports that he does not drink alcohol or use illicit drugs.   REVIEW OF SYSTEMS : Negative except for see above in problem list  Physical Exam:   BP 130/84;  P 78 and regular.  afebrile, weight 219.  There is no weight on file to calculate BMI.  Gen:  WDWN WM NAD  Neurological: Alert and oriented to person, place, and time. Motor and sensory function is grossly intact  Head: Normocephalic and atraumatic.  Eyes: Conjunctivae are normal. Pupils are equal, round, and reactive to light. No scleral icterus.  Neck: Normal range of motion. Neck supple. No tracheal deviation or thyromegaly present.  Cardiovascular:  SR without murmurs or gallops.  No carotid bruits Breast:  Not examined  Respiratory: Effort normal.  No respiratory distress. No chest wall tenderness. Breath sounds normal.  No wheezes, rales or rhonchi.  Abdomen:  nontender GU:  Right inguinal hernia; no   left inguinal hernia.  Testes normal.   Musculoskeletal: Normal range of motion. Extremities are nontender. No cyanosis, edema or clubbing noted Lymphadenopathy: No cervical, preauricular, postauricular or axillary adenopathy is present Skin: Skin is warm and dry. No rash noted. No diaphoresis. No erythema. No pallor. Pscyh: Normal mood and affect. Behavior is normal. Judgment and thought content normal.   LABORATORY RESULTS: No results found for this or any previous visit (from the past 48 hour(s)).   RADIOLOGY RESULTS: No results found.  Problem List: Patient Active Problem List   Diagnosis Date Noted  . Essential hypertension 12/18/2014  . Hyperlipidemia 12/18/2014  . First degree heart block 03/30/2013  . Smoking hx 03/07/2013   . Family history of coronary artery disease 03/07/2013  . Inguinal hernia, right 01/03/2013  . IMPAIRED FASTING GLUCOSE 06/30/2010  . NECK PAIN 02/01/2009  . SPRAIN&STRAIN OTHER SPECIFIED SITES KNEE&LEG 02/01/2009  . TENSION TYPE HEADACHE UNSPECIFIED 06/26/2008  . GERD 06/26/2008  . NEOPLASM OF UNCERTAIN BEHAVIOR OF SKIN 02/27/2008  . ERECTILE DYSFUNCTION, MILD 01/30/2008  . VENTRICULAR HYPERTROPHY, LEFT 11/29/2006  . COLON POLYP 09/06/2006  . HYPERLIPIDEMIA 09/06/2006  . HYPERTENSION, BENIGN SYSTEMIC 09/06/2006  . ASTHMA, EXERCISE INDUCED 09/06/2006    Assessment & Plan: Symptomatic right inguinal hernia    Matt B. Eulice Rutledge, MD, FACS  [facility] [phone] beeper [phone]  02/10/2015 3:11 PM      

## 2015-03-10 NOTE — Interval H&P Note (Signed)
History and Physical Interval Note:  03/10/2015 9:07 AM  Clarence Ware  has presented today for surgery, with the diagnosis of RIGHT INGUINAL HERNIA  The various methods of treatment have been discussed with the patient and family. After consideration of risks, benefits and other options for treatment, the patient has consented to  Procedure(s): OPEN RIGHT INGUINAL HERNIA REPAIR (Right) as a surgical intervention .  The patient's history has been reviewed, patient examined, no change in status, stable for surgery.  I have reviewed the patient's chart and labs.  Questions were answered to the patient's satisfaction.     Modell Fendrick B

## 2015-03-10 NOTE — Anesthesia Procedure Notes (Signed)
Procedure Name: LMA Insertion Date/Time: 03/10/2015 9:17 AM Performed by: Montel Clock Pre-anesthesia Checklist: Patient identified, Emergency Drugs available, Suction available, Patient being monitored and Timeout performed Patient Re-evaluated:Patient Re-evaluated prior to inductionOxygen Delivery Method: Circle system utilized Preoxygenation: Pre-oxygenation with 100% oxygen Intubation Type: IV induction Ventilation: Mask ventilation without difficulty LMA: LMA with gastric port inserted LMA Size: 5.0 Number of attempts: 1 Dental Injury: Teeth and Oropharynx as per pre-operative assessment

## 2015-03-10 NOTE — Brief Op Note (Signed)
03/10/2015  11:18 AM  PATIENT:  Clarence Ware  64 y.o. male  PRE-OPERATIVE DIAGNOSIS:  RIGHT INGUINAL HERNIA  POST-OPERATIVE DIAGNOSIS:  RIGHT INGUINAL HERNIA  PROCEDURE:  Procedure(s): OPEN RIGHT INGUINAL HERNIA REPAIR (Right)  SURGEON:  Surgeon(s) and Role:    * Johnathan Hausen, MD - Primary  PHYSICIAN ASSISTANT:   ASSISTANTS: none   ANESTHESIA:   general  EBL:  Total I/O In: 1000 [I.V.:1000] Out: -   BLOOD ADMINISTERED:none  DRAINS: none   LOCAL MEDICATIONS USED:  BUPIVICAINE   SPECIMEN:  No Specimen  DISPOSITION OF SPECIMEN:  N/A  COUNTS:  YES  TOURNIQUET:  * No tourniquets in log *  DICTATION: .Other Dictation: Dictation Number [id number]  PLAN OF CARE: Discharge to home after PACU  PATIENT DISPOSITION:  PACU - hemodynamically stable.   Delay start of Pharmacological VTE agent (>24hrs) due to surgical blood loss or risk of bleeding: not applicable

## 2015-03-10 NOTE — Anesthesia Postprocedure Evaluation (Signed)
  Anesthesia Post-op Note  Patient: Clarence Ware  Procedure(s) Performed: Procedure(s) (LRB): OPEN RIGHT INGUINAL HERNIA REPAIR (Right)  Patient Location: PACU  Anesthesia Type: General  Level of Consciousness: awake and alert   Airway and Oxygen Therapy: Patient Spontanous Breathing  Post-op Pain: mild  Post-op Assessment: Post-op Vital signs reviewed, Patient's Cardiovascular Status Stable, Respiratory Function Stable, Patent Airway and No signs of Nausea or vomiting  Last Vitals:  Filed Vitals:   03/10/15 1256  BP: 125/66  Pulse:   Temp:   Resp: 16    Post-op Vital Signs: stable   Complications: No apparent anesthesia complications

## 2015-03-10 NOTE — Discharge Instructions (Signed)
Inguinal Hernia, Adult  °Care After °Refer to this sheet in the next few weeks. These discharge instructions provide you with general information on caring for yourself after you leave the hospital. Your caregiver may also give you specific instructions. Your treatment has been planned according to the most current medical practices available, but unavoidable complications sometimes occur. If you have any problems or questions after discharge, please call your caregiver. °HOME CARE INSTRUCTIONS °· Put ice on the operative site. °¨ Put ice in a plastic bag. °¨ Place a towel between your skin and the bag. °¨ Leave the ice on for 15-20 minutes at a time, 03-04 times a day while awake. °· Change bandages (dressings) as directed. °· Keep the wound dry and clean. The wound may be washed gently with soap and water. Gently blot or dab the wound dry. It is okay to take showers 24 to 48 hours after surgery. Do not take baths, use swimming pools, or use hot tubs for 10 days, or as directed by your caregiver. °· Only take over-the-counter or prescription medicines for pain, discomfort, or fever as directed by your caregiver. °· Continue your normal diet as directed. °· Do not lift anything more than 10 pounds or play contact sports for 3 weeks, or as directed. °SEEK MEDICAL CARE IF: °· There is redness, swelling, or increasing pain in the wound. °· There is fluid (pus) coming from the wound. °· There is drainage from a wound lasting longer than 1 day. °· You have an oral temperature above 102° F (38.9° C). °· You notice a bad smell coming from the wound or dressing. °· The wound breaks open after the stitches (sutures) have been removed. °· You notice increasing pain in the shoulders (shoulder strap areas). °· You develop dizzy episodes or fainting while standing. °· You feel sick to your stomach (nauseous) or throw up (vomit). °SEEK IMMEDIATE MEDICAL CARE IF: °· You develop a rash. °· You have difficulty breathing. °· You  develop a reaction or have side effects to medicines you were given. °MAKE SURE YOU:  °· Understand these instructions. °· Will watch your condition. °· Will get help right away if you are not doing well or get worse. °Document Released: 07/27/2006 Document Revised: 09/18/2011 Document Reviewed: 05/26/2009 °ExitCare® Patient Information ©2015 [employer]. This information is not intended to replace advice given to you by your health care provider. Make sure you discuss any questions you have with your health care provider. ° °

## 2015-03-10 NOTE — Transfer of Care (Signed)
Immediate Anesthesia Transfer of Care Note  Patient: Clarence Ware  Procedure(s) Performed: Procedure(s): OPEN RIGHT INGUINAL HERNIA REPAIR (Right)  Patient Location: PACU  Anesthesia Type:General  Level of Consciousness:  sedated, patient cooperative and responds to stimulation  Airway & Oxygen Therapy:Patient Spontanous Breathing and Patient connected to face mask oxgen  Post-op Assessment:  Report given to PACU RN and Post -op Vital signs reviewed and stable  Post vital signs:  Reviewed and stable  Last Vitals:  Filed Vitals:   03/10/15 0716  BP: 128/77  Pulse: 60  Temp: 36.7 C  Resp: 18    Complications: No apparent anesthesia complications

## 2015-03-11 ENCOUNTER — Encounter (HOSPITAL_COMMUNITY): Payer: Self-pay | Admitting: Surgery

## 2015-03-18 ENCOUNTER — Telehealth: Payer: Self-pay | Admitting: *Deleted

## 2015-03-18 NOTE — Telephone Encounter (Signed)
Dr. Henrene Pastor,  Clarence Ware has inguinal hernia repair on 03/10/15. Okay to proceed with colonoscopy 03/19/15?

## 2015-03-18 NOTE — Telephone Encounter (Signed)
Patient called back after earlier phone call and states that his wife specifically asked Dr. Hassell Done if he could have colonoscopy on 03/19/15 after surgery 03/10/15. Dr. Hassell Done said it was fine to proceed with colonoscopy 03/19/15. Relayed information to Dr. Carlean Purl and he states to then proceed with colonoscopy for tomorrow as scheduled if that is what the surgeon specifically stated. Informed patient. He will arrive tomorrow at 1030 for 1130 am appointment.

## 2015-03-18 NOTE — Telephone Encounter (Signed)
Noted Dr. Henrene Pastor was off this afternoon and spoke to Dr. Carlean Purl regarding ok to proceed with colon with patient 9 days postop. Dr. Carlean Purl says no and states that I should call patient to cancel. Spoke with patient and explained cancellation. He states that he called here 2 weeks ago to confirm that it would be okay to proceed with colon prior to having surgery. I informed him that I was sorry but there was no documentation of that phone call occuring. Explained concerns to patient and that he should seek information from surgeon when it may be okay to proceed with colonoscopy with postop followup. He states understanding and will call back to schedule once he receives clearance from surgeon.

## 2015-03-19 ENCOUNTER — Encounter: Payer: Self-pay | Admitting: Internal Medicine

## 2015-03-19 ENCOUNTER — Encounter: Payer: 59 | Admitting: Internal Medicine

## 2015-03-19 ENCOUNTER — Ambulatory Visit (AMBULATORY_SURGERY_CENTER): Payer: 59 | Admitting: Internal Medicine

## 2015-03-19 VITALS — BP 138/77 | HR 54 | Temp 97.6°F | Resp 13 | Ht 74.0 in | Wt 222.0 lb

## 2015-03-19 DIAGNOSIS — K635 Polyp of colon: Secondary | ICD-10-CM | POA: Diagnosis not present

## 2015-03-19 DIAGNOSIS — D123 Benign neoplasm of transverse colon: Secondary | ICD-10-CM

## 2015-03-19 DIAGNOSIS — Z86010 Personal history of colonic polyps: Secondary | ICD-10-CM | POA: Diagnosis not present

## 2015-03-19 MED ORDER — SODIUM CHLORIDE 0.9 % IV SOLN: 500.0000 mL | INTRAVENOUS | Status: DC

## 2015-03-19 NOTE — Op Note (Signed)
[place]  [address] [place], [id number]   COLONOSCOPY PROCEDURE REPORT  PATIENT: Clarence Ware, Clarence Ware  MR#: [id number] BIRTHDATE: [birth year] , 63  yrs. old GENDER: male ENDOSCOPIST: Eustace Quail, MD REFERRED VE:HMCNOB Daub, M.D. PROCEDURE DATE:  03/19/2015 PROCEDURE:   Colonoscopy, surveillance and Colonoscopy with snare polypectomy x 2 First Screening Colonoscopy - Avg.  risk and is 50 yrs.  old or older - No.  Prior Negative Screening - Now for repeat screening. N/A  History of Adenoma - Now for follow-up colonoscopy & has been > or = to 3 yrs.  Yes hx of adenoma.  Has been 3 or more years since last colonoscopy.  Polyps removed today? Yes ASA CLASS:   Class II INDICATIONS:Surveillance due to prior colonic neoplasia and Colorectal Neoplasm Risk Assessment for this procedure is average risk.. Patient with hyperplastic polyps in 2003. Last colonoscopy with Dr. Wynetta Emery 2008 2 mm polyp (question path results). Told to follow-up in 2013. Sent for follow-up at this time. MEDICATIONS: Monitored anesthesia care and Propofol 330 mg IV  DESCRIPTION OF PROCEDURE:   After the risks benefits and alternatives of the procedure were thoroughly explained, informed consent was obtained.  The digital rectal exam revealed no abnormalities of the rectum.   The LB [id number]  endoscope was introduced through the anus and advanced to the cecum, which was identified by both the appendix and ileocecal valve. No adverse events experienced.   The quality of the prep was good.  (Suprep was used)  The instrument was then slowly withdrawn as the colon was fully examined. Estimated blood loss is zero unless otherwise noted in this procedure report.  COLON FINDINGS: Two polyps measuring 2 mm in size were found in the transverse colon.  A polypectomy was performed with a cold snare. The resection was complete, the polyp tissue was completely retrieved and sent to histology.    There was mild diverticulosis noted in the sigmoid colon.   The examination was otherwise normal. Retroflexed views revealed internal hemorrhoids. The time to cecum = 4.8 Withdrawal time = 13.4   The scope was withdrawn and the procedure completed. COMPLICATIONS: There were no immediate complications.  ENDOSCOPIC IMPRESSION: 1.   Two polyps were found in the transverse colon; polypectomy was performed with a cold snare 2.   Mild diverticulosis was noted in the sigmoid colon 3.   The examination was otherwise normal  RECOMMENDATIONS: 1. Repeat colonoscopy in 5 years if polyp adenomatous; otherwise 10 years  eSigned:  Eustace Quail, MD 03/19/2015 12:18 PM   cc: The Patient and Arlyss Queen, MD

## 2015-03-19 NOTE — Progress Notes (Signed)
Called to room to assist during endoscopic procedure.  Patient ID and intended procedure confirmed with present staff. Received instructions for my participation in the procedure from the performing physician.  

## 2015-03-19 NOTE — Patient Instructions (Signed)
YOU HAD AN ENDOSCOPIC PROCEDURE TODAY AT THE [facility]:   Refer to the procedure report that was given to you for any specific questions about what was found during the examination.  If the procedure report does not answer your questions, please call your gastroenterologist to clarify.  If you requested that your care partner not be given the details of your procedure findings, then the procedure report has been included in a sealed envelope for you to review at your convenience later.  YOU SHOULD EXPECT: Some feelings of bloating in the abdomen. Passage of more gas than usual.  Walking can help get rid of the air that was put into your GI tract during the procedure and reduce the bloating. If you had a lower endoscopy (such as a colonoscopy or flexible sigmoidoscopy) you may notice spotting of blood in your stool or on the toilet paper. If you underwent a bowel prep for your procedure, you may not have a normal bowel movement for a few days.  Please Note:  You might notice some irritation and congestion in your nose or some drainage.  This is from the oxygen used during your procedure.  There is no need for concern and it should clear up in a day or so.  SYMPTOMS TO REPORT IMMEDIATELY:   Following lower endoscopy (colonoscopy or flexible sigmoidoscopy):  Excessive amounts of blood in the stool  Significant tenderness or worsening of abdominal pains  Swelling of the abdomen that is new, acute  Fever of 100F or higher   For urgent or emergent issues, a gastroenterologist can be reached at any hour by calling [phone].   DIET: Your first meal following the procedure should be a small meal and then it is ok to progress to your normal diet. Heavy or fried foods are harder to digest and may make you feel nauseous or bloated.  Likewise, meals heavy in dairy and vegetables can increase bloating.  Drink plenty of fluids but you should avoid alcoholic beverages for 24  hours.  ACTIVITY:  You should plan to take it easy for the rest of today and you should NOT DRIVE or use heavy machinery until tomorrow (because of the sedation medicines used during the test).    FOLLOW UP: Our staff will call the number listed on your records the next business day following your procedure to check on you and address any questions or concerns that you may have regarding the information given to you following your procedure. If we do not reach you, we will leave a message.  However, if you are feeling well and you are not experiencing any problems, there is no need to return our call.  We will assume that you have returned to your regular daily activities without incident.  If any biopsies were taken you will be contacted by phone or by letter within the next 1-3 weeks.  Please call us at [phone] if you have not heard about the biopsies in 3 weeks.    SIGNATURES/CONFIDENTIALITY: You and/or your care partner have signed paperwork which will be entered into your electronic medical record.  These signatures attest to the fact that that the information above on your After Visit Summary has been reviewed and is understood.  Full responsibility of the confidentiality of this discharge information lies with you and/or your care-partner.  Polyp and diverticulosis information given. 

## 2015-03-19 NOTE — Progress Notes (Signed)
Report to PACU, RN, vss, BBS= Clear.  

## 2015-03-22 ENCOUNTER — Telehealth: Payer: Self-pay | Admitting: *Deleted

## 2015-03-22 NOTE — Telephone Encounter (Signed)
Left message that we called for f/u 

## 2015-03-23 ENCOUNTER — Encounter: Payer: Self-pay | Admitting: Internal Medicine

## 2015-04-12 ENCOUNTER — Encounter: Payer: Self-pay | Admitting: Emergency Medicine

## 2015-04-13 ENCOUNTER — Encounter: Payer: Self-pay | Admitting: Emergency Medicine

## 2015-08-03 ENCOUNTER — Ambulatory Visit (INDEPENDENT_AMBULATORY_CARE_PROVIDER_SITE_OTHER): Payer: 59 | Admitting: Emergency Medicine

## 2015-08-03 ENCOUNTER — Encounter: Payer: Self-pay | Admitting: Emergency Medicine

## 2015-08-03 VITALS — BP 128/80 | HR 61 | Temp 98.4°F | Resp 16 | Ht 74.5 in | Wt 223.0 lb

## 2015-08-03 DIAGNOSIS — L609 Nail disorder, unspecified: Secondary | ICD-10-CM | POA: Diagnosis not present

## 2015-08-03 DIAGNOSIS — D229 Melanocytic nevi, unspecified: Secondary | ICD-10-CM

## 2015-08-03 DIAGNOSIS — R0683 Snoring: Secondary | ICD-10-CM

## 2015-08-03 DIAGNOSIS — R600 Localized edema: Secondary | ICD-10-CM

## 2015-08-03 NOTE — Progress Notes (Signed)
Patient ID: Clarence Ware, male   DOB: [birth year], 65 y.o.   MRN: [id number]     By signing my name below, I, Zola Button, attest that this documentation has been prepared under the direction and in the presence of Arlyss Queen, MD.  Electronically Signed: Zola Button, Medical Scribe. 08/03/2015. 8:07 AM.   Chief Complaint:  Chief Complaint  Patient presents with  . Nevus    upper back  . Snoring    requesting cpap    HPI: Clarence Ware is a 65 y.o. male who reports to [facility] today with 4 separate issues.  Patient is complaining of a nevus to his upper back that has been there for a while. However, his wife has noticed recent changes to the nevus.  Patient also states that he has had significant issues with snoring, according to his wife. He has not had any definite episodes of apnea. He does have fatigue at the end of the afternoon, but is unsure if this is related to his age.  Patient reports having fungus on his right great toenail. He has been using Penlac for his nail.  Patient has also been noticing some mild leg swelling. He does not feel any swelling, but notices sock imprints in his leg at the end of the day. Patient denies calf tenderness. He agrees to try some compression socks.  Past Medical History  Diagnosis Date  . Allergy   . Substance abuse   . Hyperlipidemia   . Hypertension    Past Surgical History  Procedure Laterality Date  . Vasectomy    . Inguinal hernia repair Right 03/10/2015    Procedure: OPEN RIGHT INGUINAL HERNIA REPAIR;  Surgeon: Johnathan Hausen, MD;  Location: [facility];  Service: General;  Laterality: Right;  With MESH   Social History   Social History  . Marital Status: Married    Spouse Name: N/A  . Number of Children: N/A  . Years of Education: N/A   Occupational History  . 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 Smoking status: Former Smoker -- 0.25 packs/day for 1 years    Quit date: 07/10/1992  . Smokeless  tobacco: Never Used  . Alcohol Use: No  . Drug Use: No  . Sexual Activity: Yes   Other Topics Concern  . None   Social History Narrative   Married. Education: [employer].   Family History  Problem Relation Age of Onset  . Heart disease Mother   . Stroke Mother   . Heart disease Father 57    MI  . Hypertension Father   . Hypertension Brother   . Diabetes Brother   . Heart disease Brother   . Hyperlipidemia Brother   . Cancer Brother   . Coronary artery disease Brother 68    CABG  . Colon cancer Paternal Grandmother    Allergies  Allergen Reactions  . Nexium [Esomeprazole Magnesium]     Awful regurgitation   Prior to Admission medications   Medication Sig Start Date End Date Taking? Authorizing Provider  aspirin 325 MG tablet Take 325 mg by mouth daily.   Yes Historical Provider, MD  atorvastatin (LIPITOR) 40 MG tablet Take at night as instucted 10/13/14  Yes Darlyne Russian, MD  ciclopirox (PENLAC) 8 % solution Apply topically at bedtime. Apply over nail and surrounding skin. Apply daily over previous coat. After seven (7) days, may remove with alcohol and continue cycle. 10/13/14  Yes Darlyne Russian, MD  ibuprofen (ADVIL)  200 MG tablet Take 1 tablet (200 mg total) by mouth every 6 (six) hours as needed. 12/16/13  Yes Darlyne Russian, MD  loratadine (CLARITIN) 10 MG tablet Take 1 tablet (10 mg total) by mouth daily. Patient taking differently: Take 10 mg by mouth as needed.  12/16/13  Yes Darlyne Russian, MD  losartan (COZAAR) 50 MG tablet Take 0.5 tablets (25 mg total) by mouth daily. 10/13/14  Yes Darlyne Russian, MD  metoprolol succinate (TOPROL-XL) 25 MG 24 hr tablet Take 1 tablet (25 mg total) by mouth daily. 10/13/14  Yes Darlyne Russian, MD  tadalafil (CIALIS) 20 MG tablet Use as directed. 12/16/13  Yes Darlyne Russian, MD  Vitamin D, Cholecalciferol, 1000 UNITS CAPS Take 1 capsule by mouth daily.    Yes Historical Provider, MD     ROS: The patient denies fevers, chills, night sweats,  unintentional weight loss, chest pain, palpitations, wheezing, dyspnea on exertion, nausea, vomiting, abdominal pain, dysuria, hematuria, melena, numbness, weakness, or tingling.   All other systems have been reviewed and were otherwise negative with the exception of those mentioned in the HPI and as above.    PHYSICAL EXAM: Filed Vitals:   08/03/15 0754  BP: 128/80  Pulse: 61  Temp: 98.4 F (36.9 C)  Resp: 16   Body mass index is 28.26 kg/(m^2).   General: Alert, no acute distress HEENT:  Normocephalic, atraumatic, oropharynx patent. Normal. Eye: [facility], [facility] Cardiovascular:  Regular rate and rhythm, no rubs murmurs or gallops.  No Carotid bruits, radial pulse intact. Respiratory: Clear to auscultation bilaterally.  No wheezes, rales, or rhonchi.  No cyanosis, no use of accessory musculature Abdominal: Soft. Musculoskeletal: Gait intact. Trace edema of both upper ankles with small varicosities, both calves. Skin: 1.5 x 1 cm waxy lesion, left lank area. Markedly dystrophic nail, right great toe. Neurologic: Facial musculature symmetric. Psychiatric: Patient acts appropriately throughout our interaction. Lymphatic: No cervical or submandibular lymphadenopathy PROCEDURE NOTE   The area around the nevus left lateral mid upper back was prepped with Betadine 2. The area was swabbed with alcohol. The area was numbed with 1 mL of 2% plain. Shave biopsy was performed. Lesion sent for pathology. Patient tolerated procedure well. There was minimal bleeding which was treated with silver nitrate.    LABS:    EKG/XRAY:   Primary read interpreted by Dr. Everlene Farrier at [facility].   ASSESSMENT/PLAN: Patient doing well. He has maintained his sobriety. Referral made for evaluation of snoring to Dr. Roddie Mc. Referral made to podiatry regarding a dystrophic great nails. He does have some mild swelling and will treat this with compression stockings. The area biopsy left upper back will be treated with soap  and water cleaning followed by Vaseline. The lesion was sent but had a benign appearance. He's going to follow-up his medical care with Dr. Ronnald Ramp.I personally performed the services described in this documentation, which was scribed in my presence. The recorded information has been reviewed and is accurate.    Gross sideeffects, risk and benefits, and alternatives of medications d/w patient. Patient is aware that all medications have potential sideeffects and we are unable to predict every sideeffect or drug-drug interaction that may occur.  Arlyss Queen MD 08/03/2015 8:07 AM

## 2015-08-18 ENCOUNTER — Institutional Professional Consult (permissible substitution): Payer: 59 | Admitting: Neurology

## 2015-09-29 ENCOUNTER — Encounter: Payer: Self-pay | Admitting: Podiatry

## 2015-09-29 ENCOUNTER — Ambulatory Visit (INDEPENDENT_AMBULATORY_CARE_PROVIDER_SITE_OTHER): Payer: PPO | Admitting: Podiatry

## 2015-09-29 VITALS — BP 136/82 | HR 62 | Resp 12

## 2015-09-29 DIAGNOSIS — B351 Tinea unguium: Secondary | ICD-10-CM

## 2015-09-29 NOTE — Patient Instructions (Signed)
Today your exam suggest possible fungal infection right great toenail. I submitted nail fragments for fungal culture and PAS stain which could take up to 30+ days. Will notify you with results of the lab. If the lab is positive we will reschedule an appointment in order a baseline liver profile and issue a prescription for terbinafine 250 mg  Our office will contact you upon receipt of lab

## 2015-09-29 NOTE — Addendum Note (Signed)
Addended by: Harriett Sine D on: 09/29/2015 01:21 PM   Modules accepted: Orders

## 2015-09-29 NOTE — Progress Notes (Signed)
   Subjective:    Patient ID: Clarence Ware, male    DOB: Feb 26, 1951, 64 y.o.   MRN: [id number]  HPI  N-THICK,DISCOLORATION L-RT FOOT GREAT TOENAIL D-1 YEAR O-SLOWLY C-SAME NOT WORSE A-NONE T-RX. CIPRODEX USED FOR 8 MONTHS.  As patient presents today concerned about a year history of a thick and deformed uncomfortable right hallux toenail. The nail is uncomfortable with direct pressure and shoe wearing. Patient has apply topical Penlac 8% daily 8 months without any change in the right hallux nail. Patient presents today for follow-up for this problem    Review of Systems  Gastrointestinal: Positive for constipation.  Skin: Positive for color change.       Objective:   Physical Exam   Patient works as a Landscape architect  Objective: Orientated 3 DP pulses 2/4 bilaterally PT pulses 2/4 bilaterally Capillary reflex within normal limits bilaterally  Neurological: Sensation to 10 g monofilament wire intact 5/5 bilaterally Vibratory sensation intact bilaterally Ankle reflex equal and reactive bilaterally  Dermatological: Texture and turgor within normal limits The left hallux nail is hypertrophic, brittle, yellow with subungual debris and tender to direct palpation  Assessment: There is no restriction ankle, subtalar, midtarsal joints bilaterally        Assessment & Plan:   Assessment: Satisfactory neurovascular status 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 We described no treatment, repetitive debridement, oral medication and permanent nail removal. Patient said he would be willing to use oral medication and understands he will have to have baseline liver profiles and repeat liver profiles.  The distal aspect the right hallux was debrided and submitted to the lab for fungal culture and  PAS stain  Notify patient on results of fungal culture

## 2015-10-11 ENCOUNTER — Ambulatory Visit (INDEPENDENT_AMBULATORY_CARE_PROVIDER_SITE_OTHER): Payer: PPO | Admitting: Neurology

## 2015-10-11 ENCOUNTER — Encounter: Payer: Self-pay | Admitting: Neurology

## 2015-10-11 VITALS — BP 142/88 | HR 68 | Resp 20 | Ht 74.5 in | Wt 223.0 lb

## 2015-10-11 DIAGNOSIS — R0683 Snoring: Secondary | ICD-10-CM

## 2015-10-11 DIAGNOSIS — R519 Headache, unspecified: Secondary | ICD-10-CM

## 2015-10-11 DIAGNOSIS — R51 Headache: Secondary | ICD-10-CM | POA: Diagnosis not present

## 2015-10-11 DIAGNOSIS — G4733 Obstructive sleep apnea (adult) (pediatric): Secondary | ICD-10-CM | POA: Diagnosis not present

## 2015-10-11 NOTE — Patient Instructions (Signed)

## 2015-10-11 NOTE — Progress Notes (Signed)
SLEEP MEDICINE CLINIC   Provider:  Larey Ware, M D  Referring Provider: Darlyne Russian, MD Primary Care Physician:  Clarence Reichmann, MD  Chief Complaint  Patient presents with  . New Patient (Initial Visit)    snoring, never had sleep study, rm 10, alone    HPI:  Clarence Ware is a 65 y.o. male , he is a Software engineer , still working. He seen here as a referral from Dr. Everlene Ware for for a sleep consultation,  Chief complaint according to patient :" I have been snoring for while but it seems to have gotten worse and my wife often moves to another bedroom" I sometimes wake up with headaches, sleep quality is difficult to judge because the recent reported developments took place over at least a decade.  [name] has respiratory allergies and a history of hyperlipidemia and hypertension, he has no history of morbid obesity, and no history of shift work.  Sleep habits are as follows: Patient usually goes to bed around 10 PM and rises at 6 AM. He usually falls asleep promptly after going to bed, he likes to read in bed, he sleeps on the side when he falls asleep but during the night resume supine position that when he snores the loudest. His wife often nudges him. He sleeps on 2 pillows. He does not watch TV in the bedroom, neither does his wife. He may have one bathroom break at night. This means he will get about 7 hours of sleep at night. He uses an alarm to wake up in the morning at about 6 AM, many mornings he is just awake before the alarm rings. Has a dry, parched mouth, and sore throat. He takes breakfast regularly at home and he drinks coffee in the morning. He may drink another diet soda with caffeine during the day. Again he no longer smokes or uses alcohol.  Sleep medical history and family sleep history: The patient is unaware of any close family members that may have a sleep disorder or may be treated for sleep disorder. Social history: Mr. Eichenberg is married, lives with his  wife, he no longer smokes and no longer takes pain medication. 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  Review of Systems: Out of a complete 14 system review, the patient complains of only the following symptoms, and all other reviewed systems are negative. Snoring, his wife is bothered. Rarely has he woken himself.   Epworth score 5 , Fatigue severity score 31 , depression score 2.   Social History   Social History  . Marital Status: Married    Spouse Name: N/A  . Number of Children: N/A  . Years of Education: N/A   Occupational History  . 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 Smoking status: Former Smoker -- 0.25 packs/day for 1 years    Quit date: 07/10/1992  . Smokeless tobacco: Never Used  . Alcohol Use: No  . Drug Use: No  . Sexual Activity: Yes   Other Topics Concern  . Not on file   Social History Narrative   Married. Education: [employer].    Family History  Problem Relation Age of Onset  . Heart disease Mother   . Stroke Mother   . Heart disease Father 76    MI  . Hypertension Father   . Hypertension Brother   . Diabetes Brother   . Heart disease Brother   .  Hyperlipidemia Brother   . Cancer Brother   . Coronary artery disease Brother 40    CABG  . Colon cancer Paternal Grandmother     Past Medical History  Diagnosis Date  . Allergy   . Substance abuse   . Hyperlipidemia   . Hypertension     Past Surgical History  Procedure Laterality Date  . Vasectomy    . Inguinal hernia repair Right 03/10/2015    Procedure: OPEN RIGHT INGUINAL HERNIA REPAIR;  Surgeon: Johnathan Hausen, MD;  Location: [facility];  Service: General;  Laterality: Right;  With MESH    Current Outpatient Prescriptions  Medication Sig Dispense Refill  . aspirin 325 MG tablet Take 325 mg by mouth daily.    Marland Kitchen atorvastatin (LIPITOR) 40 MG tablet Take at night as instucted 90 tablet 3  . ciclopirox (PENLAC) 8 % solution  Apply topically at bedtime. Apply over nail and surrounding skin. Apply daily over previous coat. After seven (7) days, may remove with alcohol and continue cycle. 6.6 mL 5  . ibuprofen (ADVIL) 200 MG tablet Take 1 tablet (200 mg total) by mouth every 6 (six) hours as needed. 100 tablet 3  . loratadine (CLARITIN) 10 MG tablet Take 1 tablet (10 mg total) by mouth daily. (Patient taking differently: Take 10 mg by mouth as needed. ) 90 tablet 3  . losartan (COZAAR) 50 MG tablet Take 0.5 tablets (25 mg total) by mouth daily. 90 tablet 3  . metoprolol succinate (TOPROL-XL) 25 MG 24 hr tablet Take 1 tablet (25 mg total) by mouth daily. 90 tablet 3  . tadalafil (CIALIS) 20 MG tablet Use as directed. 6 tablet 11  . Vitamin D, Cholecalciferol, 1000 UNITS CAPS Take 1 capsule by mouth daily.      No current facility-administered medications for this visit.    Allergies as of 10/11/2015 - Review Complete 10/11/2015  Allergen Reaction Noted  . Nexium [esomeprazole magnesium]  03/14/2013    Vitals: BP 142/88 mmHg  Pulse 68  Resp 20  Ht 6' 2.5" (1.892 m)  Wt 223 lb (101.152 kg)  BMI 28.26 kg/m2 Last Weight:  Wt Readings from Last 1 Encounters:  10/11/15 223 lb (101.152 kg)   [id number] mass index is 28.26 kg/(m^2).     Last Height:   Ht Readings from Last 1 Encounters:  10/11/15 6' 2.5" (1.892 m)    Physical exam:  General: The patient is awake, alert and appears not in acute distress. The patient is well groomed. Head: Normocephalic, atraumatic. Neck is supple. Mallampati 4,  neck circumference 17 . Nasal airflow restricted , he had deviated septum surgery. TMJ not  evident . Retrognathia is not seen.  Full facial hair.  Cardiovascular:  Regular rate and rhythm, without  murmurs or carotid bruit, and without distended neck veins. Respiratory: Lungs are clear to auscultation. Skin:  Without evidence of edema, or rash Trunk: BMI is 28. The patient's posture is erect  Neurologic exam : The  patient is awake and alert, oriented to place and time.   Memory subjective  described as intact.   Attention span & concentration ability appears normal.  Speech is fluent,  without dysarthria, dysphonia or aphasia.  Mood and affect are appropriate.  Cranial nerves: Pupils are equal and briskly reactive to light. Funduscopic exam without evidence of pallor or edema.  Extraocular movements  in vertical and horizontal planes intact and without nystagmus. Visual fields by finger perimetry are intact. Hearing to finger rub intact.  Facial sensation intact to fine touch.  Facial motor strength is symmetric and tongue and uvula move midline. Shoulder shrug was symmetrical.   Motor exam:  Normal tone, muscle bulk and symmetric strength in all extremities. Sensory:  Fine touch, pinprick and vibration were tested in all extremities. Proprioception tested in the upper extremities was normal. Coordination: Rapid alternating movements in the fingers/hands was normal. Finger-to-nose maneuver  normal without evidence of ataxia, dysmetria or tremor. Gait and station: Patient walks without assistive device and is able unassisted to climb up to the exam table. Strength within normal limits.  Stance is stable and normal.   Deep tendon reflexes: in the  upper and lower extremities are symmetric and intact. Babinski maneuver response is  downgoing.  The patient was advised of the nature of the diagnosed sleep disorder , the treatment options and risks for general a health and wellness arising from not treating the condition.  I spent more than 45 minutes of face to face time with the patient. Greater than 50% of time was spent in counseling and coordination of care. We have discussed the diagnosis and differential and I answered the patient's questions.       Dear Dr. Everlene Ware, Here is my Assessment for your patient,   Assessment:  After physical and neurologic examination, review of laboratory studies,   Personal review of imaging studies, reports of other /same  Imaging studies ,  Results of polysomnography/ neurophysiology testing and pre-existing records as far as provided in visit., my assessment is   1) Mr. Pace reports to be a loud snorer, and this has been witnessed by his wife, who has also witnessed apneas on occasion. He believes that over the last decade or so he has become a more thunderous snorer and this seems to be a positional component associated with it. The supine sleep position makes it worse.  2) the patient wakes up with a dry mouth and often even a sore throat, as well as occasional morning headaches. These headaches do not wake him but are present when he wakens.  3) there is no history of heart disease for the patient himself, and nasal septal deviation was corrected during college years, he is a former smoker but this history is remote about 1 year of smoking quit 1994. He takes Advil, Claritin, Cozaar, Toprol, Cialis when necessary, lipitor and vitamin D supplement when necessary.   Plan:  Treatment plan and additional workup :  SPLIT study. Revisit after sleep study. CO2 only if available. Watch for hypoxemia.    Asencion Partridge Fradel Baldonado MD  10/11/2015   CC: Clarence Ware, [address] [place], [place] [address]

## 2015-10-13 ENCOUNTER — Encounter: Payer: Self-pay | Admitting: *Deleted

## 2015-11-02 ENCOUNTER — Telehealth: Payer: Self-pay | Admitting: *Deleted

## 2015-11-02 NOTE — Telephone Encounter (Signed)
Dr. Amalia Hailey reviewed 09/29/2015 fungal culture as +, and pt needs and appt.  Informed pt of results and transferred to schedulers.

## 2015-11-10 ENCOUNTER — Ambulatory Visit (INDEPENDENT_AMBULATORY_CARE_PROVIDER_SITE_OTHER): Payer: PPO | Admitting: Neurology

## 2015-11-10 DIAGNOSIS — R519 Headache, unspecified: Secondary | ICD-10-CM

## 2015-11-10 DIAGNOSIS — R51 Headache: Secondary | ICD-10-CM

## 2015-11-10 DIAGNOSIS — R0683 Snoring: Secondary | ICD-10-CM

## 2015-11-10 DIAGNOSIS — G4733 Obstructive sleep apnea (adult) (pediatric): Secondary | ICD-10-CM | POA: Diagnosis not present

## 2015-11-24 ENCOUNTER — Encounter: Payer: Self-pay | Admitting: Podiatry

## 2015-11-24 ENCOUNTER — Telehealth: Payer: Self-pay

## 2015-11-24 ENCOUNTER — Ambulatory Visit (INDEPENDENT_AMBULATORY_CARE_PROVIDER_SITE_OTHER): Payer: PPO | Admitting: Podiatry

## 2015-11-24 VITALS — BP 138/87 | HR 64 | Resp 16

## 2015-11-24 DIAGNOSIS — Z79899 Other long term (current) drug therapy: Secondary | ICD-10-CM

## 2015-11-24 DIAGNOSIS — B351 Tinea unguium: Secondary | ICD-10-CM

## 2015-11-24 DIAGNOSIS — R0683 Snoring: Secondary | ICD-10-CM

## 2015-11-24 MED ORDER — TERBINAFINE HCL 250 MG PO TABS: 250.0000 mg | ORAL_TABLET | Freq: Every day | ORAL | Status: DC

## 2015-11-24 NOTE — Progress Notes (Signed)
   Subjective:    Patient ID: Clarence Ware, male    DOB: 08/02/1950, 65 y.o.   MRN: [id number]  HPI  As patient presents today concerned about a year history of a thick and deformed uncomfortable right hallux toenail. The nail is uncomfortable with direct pressure and shoe wearing. Patient has apply topical Penlac 8% daily 8 months without any change in the right hallux nail. Patient presents today for follow-up for this problem.On the visit of 09/29/2015 a PAS and fungal culture was obtained and patient presents today for review of lab and treatment options   Review of Systems  Skin: Positive for color change.  All other systems reviewed and are negative.      Objective:   Physical Exam  Patient works as a Landscape architect  Objective: Orientated 3 DP pulses 2/4 bilaterally PT pulses 2/4 bilaterally Capillary reflex within normal limits bilaterally  Neurological: Sensation to 10 g monofilament wire intact 5/5 bilaterally Vibratory sensation intact bilaterally Ankle reflex equal and reactive bilaterally  Dermatological: Texture and turgor within normal limits The left hallux nail is hypertrophic, brittle, yellow with subungual debris and tender to direct palpation  Musculoskeletal: There is no restriction ankle, subtalar, midtarsal joints bilaterally  Results of [place] anthology service  B [id number] collection date 09/29/2015 received date 10/08/2015 Moderate fungal growth observed most typical of dermatophytes Fungal culture negative       Assessment & Plan:   Assessment: PAS stain confirms dermatophytes  Plan: Today review the results of the lab with patient today and discussed treatment options including no treatment, topical treatment repetitive debridement and oral medication. Patient is opting for oral medication  Patient given request for baseline hepatic function and within normal limits patient will be notified to begin Terbinafine 250 mg #30 one  by mouth daily, 2 refills  Reappoint 30 days

## 2015-11-24 NOTE — Patient Instructions (Signed)
Today your lab results was reviewed demonstrating dermatophytes, fungal infection in your toenails A baseline liver profile was ordered today no fasting required. Upon receipt of baseline liver within normal limits began oral medication Terbinafine 250 mg #30 one daily 2 refills Reevaluate 30 days

## 2015-11-24 NOTE — Telephone Encounter (Signed)
Spoke to pt. I advised him that Dr. Brett Fairy reviewed his sleep study results and wanted pt to know that his study revealed loud snoring but was not associated with arousals primarily. There was no evidence for osa. The study revealed snoring, hypoxia but not osa. Snoring may be treated with a dental device or ENT procedure. Pt says he has already had his tonsils out. Pt states that he would like to proceed with the dental device and wishes the referral to be sent to Dr. Ron Parker. I advised pt that Dr. Brett Fairy could consider treating the PLMS if they are associated with a compelling history of restless legs syndrome or PLMS disorder. Pt says his arms and legs moving do not bother him during his sleep, stating " I am not worried about that." I advised pt to avoid sedative-hypnotics which may worsen sleep apnea, alcohol and tobacco. I advised pt to avoid caffeine containing beverages and chocolate. Pt declined a follow up appt with Dr. Brett Fairy at this time. He says that he wants the referral to Dr. Ron Parker for the dental device and will call our office to schedule an appt after he starts using his dental device and discuss his headaches. Pt verbalized understanding. Pt had no questions at this time but was encouraged to call back if questions arise.

## 2015-11-30 DIAGNOSIS — Z79899 Other long term (current) drug therapy: Secondary | ICD-10-CM | POA: Diagnosis not present

## 2015-11-30 LAB — HEPATIC FUNCTION PANEL
ALBUMIN: 4.1 g/dL (ref 3.6–5.1)
ALK PHOS: 70 U/L (ref 40–115)
ALT: 27 U/L (ref 9–46)
AST: 24 U/L (ref 10–35)
Bilirubin, Direct: 0.1 mg/dL (ref <=0.2)
Indirect Bilirubin: 0.5 mg/dL (ref 0.2–1.2)
TOTAL PROTEIN: 6.3 g/dL (ref 6.1–8.1)
Total Bilirubin: 0.6 mg/dL (ref 0.2–1.2)

## 2015-12-01 ENCOUNTER — Telehealth: Payer: Self-pay

## 2015-12-01 NOTE — Telephone Encounter (Signed)
Patient request a refill of Toprol-XL. Patient stated pharmacy sent over a request and no response. Patient has been out of medication for two days. Patient request for refill to be completed today. [place].

## 2015-12-01 NOTE — Telephone Encounter (Signed)
THIS CALL IS FROM [facility]. PHARMACIST STATES HE CAN NOT SEND THE REFILL REQUEST ELECTRONICALLY BECAUSE IT IS IN THEIR OLD SYSTEM. PLEASE CALL BECAUSE THEY ALSO NEED TO KNOW WHAT THE DOSAGE IS NOW FOR THE METOPROLOL.  BEST PHONE [phone] ([place]) [place]

## 2015-12-01 NOTE — Telephone Encounter (Signed)
rx refill called in

## 2015-12-02 ENCOUNTER — Telehealth: Payer: Self-pay | Admitting: *Deleted

## 2015-12-02 NOTE — Telephone Encounter (Signed)
Dr. Amalia Hailey reviewed 11/30/2015 hepatic function test as normal and ordered pt to take the Lamisil as directed.  Left message informing pt of Dr. Phoebe Perch orders.

## 2015-12-09 DIAGNOSIS — H2513 Age-related nuclear cataract, bilateral: Secondary | ICD-10-CM | POA: Diagnosis not present

## 2015-12-09 DIAGNOSIS — H35372 Puckering of macula, left eye: Secondary | ICD-10-CM | POA: Diagnosis not present

## 2015-12-09 DIAGNOSIS — Z0100 Encounter for examination of eyes and vision without abnormal findings: Secondary | ICD-10-CM | POA: Diagnosis not present

## 2016-01-13 ENCOUNTER — Ambulatory Visit (INDEPENDENT_AMBULATORY_CARE_PROVIDER_SITE_OTHER): Payer: PPO | Admitting: Emergency Medicine

## 2016-01-13 ENCOUNTER — Encounter: Payer: Self-pay | Admitting: Emergency Medicine

## 2016-01-13 VITALS — BP 120/80 | HR 57 | Temp 98.3°F | Resp 16 | Ht 72.5 in | Wt 217.2 lb

## 2016-01-13 DIAGNOSIS — Z131 Encounter for screening for diabetes mellitus: Secondary | ICD-10-CM | POA: Diagnosis not present

## 2016-01-13 DIAGNOSIS — E785 Hyperlipidemia, unspecified: Secondary | ICD-10-CM | POA: Diagnosis not present

## 2016-01-13 DIAGNOSIS — Z1159 Encounter for screening for other viral diseases: Secondary | ICD-10-CM

## 2016-01-13 DIAGNOSIS — Z0000 Encounter for general adult medical examination without abnormal findings: Secondary | ICD-10-CM

## 2016-01-13 DIAGNOSIS — Z23 Encounter for immunization: Secondary | ICD-10-CM

## 2016-01-13 DIAGNOSIS — I10 Essential (primary) hypertension: Secondary | ICD-10-CM | POA: Diagnosis not present

## 2016-01-13 DIAGNOSIS — Z125 Encounter for screening for malignant neoplasm of prostate: Secondary | ICD-10-CM

## 2016-01-13 DIAGNOSIS — R0683 Snoring: Secondary | ICD-10-CM | POA: Diagnosis not present

## 2016-01-13 DIAGNOSIS — N5201 Erectile dysfunction due to arterial insufficiency: Secondary | ICD-10-CM

## 2016-01-13 DIAGNOSIS — R3989 Other symptoms and signs involving the genitourinary system: Secondary | ICD-10-CM | POA: Diagnosis not present

## 2016-01-13 DIAGNOSIS — Z8249 Family history of ischemic heart disease and other diseases of the circulatory system: Secondary | ICD-10-CM | POA: Diagnosis not present

## 2016-01-13 LAB — POCT CBC
GRANULOCYTE PERCENT: 63.9 % (ref 37–80)
HCT, POC: 42.2 % — AB (ref 43.5–53.7)
Hemoglobin: 15 g/dL (ref 14.1–18.1)
LYMPH, POC: 1.6 (ref 0.6–3.4)
MCH: 31 pg (ref 27–31.2)
MCHC: 35.5 g/dL — AB (ref 31.8–35.4)
MCV: 87.4 fL (ref 80–97)
MID (CBC): 0.6 (ref 0–0.9)
MPV: 6.8 fL (ref 0–99.8)
PLATELET COUNT, POC: 193 10*3/uL (ref 142–424)
POC GRANULOCYTE: 3.9 (ref 2–6.9)
POC LYMPH %: 26.1 % (ref 10–50)
POC MID %: 10 %M (ref 0–12)
RBC: 4.83 M/uL (ref 4.69–6.13)
RDW, POC: 13.2 %
WBC: 6.1 10*3/uL (ref 4.6–10.2)

## 2016-01-13 LAB — HEPATITIS C ANTIBODY: HCV Ab: NEGATIVE

## 2016-01-13 LAB — LIPID PANEL
CHOL/HDL RATIO: 2.7 ratio (ref <=5.0)
Cholesterol: 146 mg/dL (ref 125–200)
HDL: 54 mg/dL (ref >=40)
LDL CALC: 82 mg/dL (ref <130)
Triglycerides: 50 mg/dL (ref <150)
VLDL: 10 mg/dL (ref <30)

## 2016-01-13 LAB — POCT URINALYSIS DIP (MANUAL ENTRY)
BILIRUBIN UA: NEGATIVE
BILIRUBIN UA: NEGATIVE
Glucose, UA: NEGATIVE
LEUKOCYTES UA: NEGATIVE
Nitrite, UA: NEGATIVE
PH UA: 7.5
RBC UA: NEGATIVE
Spec Grav, UA: 1.015
Urobilinogen, UA: 1

## 2016-01-13 LAB — POC MICROSCOPIC URINALYSIS (UMFC): MUCUS RE: ABSENT

## 2016-01-13 LAB — COMPLETE METABOLIC PANEL WITH GFR
ALBUMIN: 4.2 g/dL (ref 3.6–5.1)
ALK PHOS: 67 U/L (ref 40–115)
ALT: 17 U/L (ref 9–46)
AST: 18 U/L (ref 10–35)
BILIRUBIN TOTAL: 0.6 mg/dL (ref 0.2–1.2)
BUN: 18 mg/dL (ref 7–25)
CALCIUM: 9 mg/dL (ref 8.6–10.3)
CO2: 25 mmol/L (ref 20–31)
CREATININE: 1.06 mg/dL (ref 0.70–1.25)
Chloride: 105 mmol/L (ref 98–110)
GFR, Est African American: 85 mL/min (ref >=60)
GFR, Est Non African American: 73 mL/min (ref >=60)
GLUCOSE: 104 mg/dL — AB (ref 65–99)
POTASSIUM: 4.2 mmol/L (ref 3.5–5.3)
SODIUM: 137 mmol/L (ref 135–146)
TOTAL PROTEIN: 6.2 g/dL (ref 6.1–8.1)

## 2016-01-13 LAB — HIV ANTIBODY (ROUTINE TESTING W REFLEX): HIV: NONREACTIVE

## 2016-01-13 LAB — POCT GLYCOSYLATED HEMOGLOBIN (HGB A1C): Hemoglobin A1C: 5.9

## 2016-01-13 MED ORDER — LOSARTAN POTASSIUM 50 MG PO TABS: 25.0000 mg | ORAL_TABLET | Freq: Every day | ORAL | Status: DC

## 2016-01-13 MED ORDER — TADALAFIL 20 MG PO TABS: ORAL_TABLET | ORAL | Status: DC

## 2016-01-13 MED ORDER — METOPROLOL SUCCINATE ER 25 MG PO TB24: 25.0000 mg | ORAL_TABLET | Freq: Every day | ORAL | Status: DC

## 2016-01-13 MED ORDER — ATORVASTATIN CALCIUM 40 MG PO TABS: ORAL_TABLET | ORAL | Status: DC

## 2016-01-13 NOTE — Progress Notes (Signed)
Patient ID: Clarence Ware, male   DOB: 11/28/1950, 66 y.o.   MRN: [id number]    By signing my name below, I, Essence Howell, attest that this documentation has been prepared under the direction and in the presence of Darlyne Russian, MD Electronically Signed: Ladene Artist, ED Scribe 01/13/2016 at 10:48 AM.  Chief Complaint:  Chief Complaint  Patient presents with  . Annual Exam  . Medication Refill    all meds,    HPI: Clarence Ware is a 65 y.o. male who reports to [facility] today for an annual exam. Pt followed up with podiatry for mycotic left great toenail 2 months ago. He was started on Lamisil 6 weeks ago without complications. Pt had a sleep study done and does not have sleep apnea. He had a colonoscopy last fall and hernia repair surgery last summer. Pt followed up with his eye doctor approximately 2 months ago. He has never seen a dermatologist and has no h/o skin CA, however, he has had some benign skin tags removed.  Family h/o includes a brother with a h/o DM and another brother with a h/o bypass surgery. Pt recently received a call from his cardiologist a few weeks ago who he sees every 2 years. He plans to schedule an appointment to follow-up with his cardiologist in a few weeks.   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 His son, Clarence Ware, is getting married in [place] next week.   Past Medical History  Diagnosis Date  . Allergy   . Substance abuse   . Hyperlipidemia   . Hypertension    Past Surgical History  Procedure Laterality Date  . Vasectomy    . Inguinal hernia repair Right 03/10/2015    Procedure: OPEN RIGHT INGUINAL HERNIA REPAIR;  Surgeon: Johnathan Hausen, MD;  Location: [facility];  Service: General;  Laterality: Right;  With MESH   Social History   Social History  . Marital Status: Married    Spouse Name: N/A  . Number of Children: N/A  . Years of Education: N/A   Occupational History  . Pharmacist [place]   Social History Main Topics  .  Smoking status: Former Smoker -- 0.25 packs/day for 1 years    Quit date: 07/10/1992  . Smokeless tobacco: Never Used  . Alcohol Use: No  . Drug Use: No  . Sexual Activity: Yes   Other Topics Concern  . None   Social History Narrative   Married. Education: [employer].   Exercise: some   Family History  Problem Relation Age of Onset  . Heart disease Mother   . Stroke Mother   . Heart disease Father 59    MI  . Hypertension Father   . Hypertension Brother   . Diabetes Brother   . Heart disease Brother   . Hyperlipidemia Brother   . Cancer Brother   . Coronary artery disease Brother 52    CABG  . Colon cancer Paternal Grandmother    Allergies  Allergen Reactions  . Nexium [Esomeprazole Magnesium]     Awful regurgitation   Prior to Admission medications   Medication Sig Start Date End Date Taking? Authorizing Provider  aspirin 325 MG tablet Take 325 mg by mouth daily.    Historical Provider, MD  atorvastatin (LIPITOR) 40 MG tablet Take at night as instucted 10/13/14   Darlyne Russian, MD  ciclopirox (PENLAC) 8 % solution Apply topically at bedtime. Apply over nail and surrounding skin. Apply daily over  previous coat. After seven (7) days, may remove with alcohol and continue cycle. 10/13/14   Darlyne Russian, MD  ibuprofen (ADVIL) 200 MG tablet Take 1 tablet (200 mg total) by mouth every 6 (six) hours as needed. 12/16/13   Darlyne Russian, MD  loratadine (CLARITIN) 10 MG tablet Take 1 tablet (10 mg total) by mouth daily. Patient taking differently: Take 10 mg by mouth as needed.  12/16/13   Darlyne Russian, MD  losartan (COZAAR) 50 MG tablet Take 0.5 tablets (25 mg total) by mouth daily. 10/13/14   Darlyne Russian, MD  metoprolol succinate (TOPROL-XL) 25 MG 24 hr tablet Take 1 tablet (25 mg total) by mouth daily. 10/13/14   Darlyne Russian, MD  tadalafil (CIALIS) 20 MG tablet Use as directed. 12/16/13   Darlyne Russian, MD  terbinafine (LAMISIL) 250 MG tablet Take 1 tablet (250 mg total) by mouth daily.  11/24/15   Gean Birchwood, DPM  Vitamin D, Cholecalciferol, 1000 UNITS CAPS Take 1 capsule by mouth daily.     Historical Provider, MD   ROS: The patient denies fevers, chills, night sweats, unintentional weight loss, chest pain, palpitations, wheezing, dyspnea on exertion, nausea, vomiting, abdominal pain, dysuria, hematuria, melena, numbness, weakness, or tingling.   All other systems have been reviewed and were otherwise negative with the exception of those mentioned in the HPI and as above.    PHYSICAL EXAM: Filed Vitals:   01/13/16 0915  BP: 120/80  Temp: 98.3 F (36.8 C)  Resp: 16   Body mass index is 29.04 kg/(m^2).  General: Alert, no acute distress HEENT:  Normocephalic, atraumatic, oropharynx patent. Bilateral hearing aids Eye: [facility], [facility] Cardiovascular: grade 1/6 faint systolic murmur at the L sternal border Respiratory: Clear to auscultation bilaterally. No wheezes, rales, or rhonchi. No cyanosis, no use of accessory musculature Abdominal: No organomegaly, abdomen is soft and non-tender, positive bowel sounds. No masses. Musculoskeletal: Gait intact. No edema, tenderness GU: R lobe of prostate is somewhat firm but smooth, non tender to touch Skin: No rashes. Neurologic: Facial musculature symmetric. Psychiatric: Patient acts appropriately throughout our interaction. Lymphatic: No cervical or submandibular lymphadenopathy  LABS: Results for orders placed or performed in visit on 01/13/16  POCT CBC  Result Value Ref Range   WBC 6.1 4.6 - 10.2 K/uL   Lymph, poc 1.6 0.6 - 3.4   POC LYMPH PERCENT 26.1 10 - 50 %L   MID (cbc) 0.6 0 - 0.9   POC MID % 10.0 0 - 12 %M   POC Granulocyte 3.9 2 - 6.9   Granulocyte percent 63.9 37 - 80 %G   RBC 4.83 4.69 - 6.13 M/uL   Hemoglobin 15.0 14.1 - 18.1 g/dL   HCT, POC 42.2 (A) 43.5 - 53.7 %   MCV 87.4 80 - 97 fL   MCH, POC 31.0 27 - 31.2 pg   MCHC 35.5 (A) 31.8 - 35.4 g/dL   RDW, POC 13.2 %   Platelet Count, POC 193 142 -  424 K/uL   MPV 6.8 0 - 99.8 fL  POCT Microscopic Urinalysis (UMFC)  Result Value Ref Range   WBC,UR,HPF,POC None None WBC/hpf   RBC,UR,HPF,POC None None RBC/hpf   Bacteria Few (A) None, Too numerous to count   Mucus Absent Absent   Epithelial Cells, UR Per Microscopy None None, Too numerous to count cells/hpf  POCT urinalysis dipstick  Result Value Ref Range   Color, UA yellow yellow   Clarity, UA clear  clear   Glucose, UA negative negative   Bilirubin, UA negative negative   Ketones, POC UA negative negative   Spec Grav, UA 1.015    Blood, UA negative negative   pH, UA 7.5    Protein Ur, POC trace (A) negative   Urobilinogen, UA 1.0    Nitrite, UA Negative Negative   Leukocytes, UA Negative Negative  POCT glycosylated hemoglobin (Hb A1C)  Result Value Ref Range   Hemoglobin A1C 5.9    EKG/XRAY:   Primary read interpreted by Dr. Everlene Farrier at [facility].  ASSESSMENT/PLAN: Patient doing well. He has maintained his sobriety for years. 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 His regular medications were refilled. I was concerned the lateral portion of the prostate was somewhat firm. He will be given Prevnar today with plans to give him Pneumovax 23 in one year. PSA was done and referral made to urology.I personally performed the services described in this documentation, which was scribed in my presence. The recorded information has been reviewed and is accurate.    Gross sideeffects, risk and benefits, and alternatives of medications d/w patient. Patient is aware that all medications have potential sideeffects and we are unable to predict every sideeffect or drug-drug interaction that may occur.  Arlyss Queen MD 01/13/2016 9:20 AM

## 2016-01-13 NOTE — Patient Instructions (Signed)
     IF you received an x-ray today, you will receive an invoice from [facility]. Please contact [facility] at [phone] with questions or concerns regarding your invoice.   IF you received labwork today, you will receive an invoice from [facility]. Please contact [facility] at [phone] with questions or concerns regarding your invoice.   Our billing staff will not be able to assist you with questions regarding bills from these companies.  You will be contacted with the lab results as soon as they are available. The fastest way to get your results is to activate your My Chart account. Instructions are located on the last page of this paperwork. If you have not heard from us regarding the results in 2 weeks, please contact this office.      

## 2016-01-14 LAB — PSA: PSA: 1.67 ng/mL (ref <=4.00)

## 2016-01-21 ENCOUNTER — Telehealth: Payer: Self-pay

## 2016-01-21 NOTE — Telephone Encounter (Signed)
-----   Message from Darlyne Russian, MD sent at 01/14/2016  7:37 AM EDT ----- Labs are good. Blood sugar is just slightly elevated. His PSA was normal which is very reassuring. I still would like him to see the urologist check his prostate gland. No changes in medication.

## 2016-01-21 NOTE — Telephone Encounter (Signed)
LMOVM with message per Dr. Everlene Farrier.  Advised to expect a call re: referral to Urology.

## 2016-03-15 DIAGNOSIS — N400 Enlarged prostate without lower urinary tract symptoms: Secondary | ICD-10-CM | POA: Diagnosis not present

## 2016-04-19 ENCOUNTER — Ambulatory Visit (INDEPENDENT_AMBULATORY_CARE_PROVIDER_SITE_OTHER): Payer: PPO | Admitting: Cardiovascular Disease

## 2016-04-19 VITALS — BP 128/82 | HR 57 | Ht 74.5 in | Wt 221.4 lb

## 2016-04-19 DIAGNOSIS — I440 Atrioventricular block, first degree: Secondary | ICD-10-CM

## 2016-04-19 DIAGNOSIS — E7800 Pure hypercholesterolemia, unspecified: Secondary | ICD-10-CM | POA: Diagnosis not present

## 2016-04-19 DIAGNOSIS — Z87898 Personal history of other specified conditions: Secondary | ICD-10-CM | POA: Diagnosis not present

## 2016-04-19 DIAGNOSIS — I10 Essential (primary) hypertension: Secondary | ICD-10-CM

## 2016-04-19 NOTE — Patient Instructions (Signed)
Your physician wants you to follow-up in: 1 year or sooner if needed. You will receive a reminder letter in the mail two months in advance. If you don't receive a letter, please call our office to schedule the follow-up appointment.   If you need a refill on your cardiac medications before your next appointment, please call your pharmacy.   

## 2016-04-22 ENCOUNTER — Encounter: Payer: Self-pay | Admitting: Cardiovascular Disease

## 2016-04-22 NOTE — Progress Notes (Signed)
Patient ID: Clarence Ware, male   DOB: 1950/08/07, 65 y.o.   MRN: [id number]    Primary M.D.: Dr. Ivar Bury  HPI: Clarence Ware is a 65 y.o. male presents to the office today for a 16 month followup cardiology evaluation.  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 He has a history of hyperlipidemia and hypertension. Approximately 9 years ago he had a normal routine treadmill test. The patient has continued to exercise regularly. There is a prior history of substance abuse for which he did undergo treatment.  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  He  has a history of hyperlipidemia, erectile dysfunction, a history of intermittent headaches.  When I saw him on 11/07/2012, we discussed the sensitivity and specificity of routine treadmill testing with a strong family history I elected to schedule him for a cardiopulmonary met test for further evaluation would also be helpful to assess endothelial dysfunction/diastolic dysfunction evaluate potential for microvascular angina. He also had a soft cardiac murmur at his apex and recommended a 2-D echo Doppler study.  His cardiopulmonary met test revealed that he had good functional capacity with an excellent maximum oxygen consumption peak of 92% of predicted. However, he had a abnormal response during the last several minutes of exercise suggesting ischemic myocardial dysfunction with decreasing stroke volume with increasing work rate which corresponded to an abrupt decrease in cardiac output suggestive of exercise-induced myocardial dysfunction. He did have very mild ventilation/perfusion mismatch with reference to his pulmonary status.  A 2-D echo Doppler study showed normal systolic  function with an ejection fraction of 55-60%. He had normal diastolic parameters.  When I saw him in followup of his noninvasive studies in June 2014 due to the mild abnormality noted during the last 7 minutes of exercise I recommended the addition of ARB therapy and further titrated his atorvastatin for aggressive lipid management. He has tolerated initiation of losartan.  I last saw him on most 2 years ago.  He denied any  chest pain or palpitations. He He did have a followup NMR lipoprotein of which showed an LDL particle number of 1216 with a calculated LDL of 86, HDL 46, triglycerides 58, total cholesterol 144. He had 552 small LDL some particles. HDL particle number was reduced at 29.1. Insulin resistance score was normal at 33.  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  However, he is still walking at least 3 days per week for a minimum of 30 minutes.  He underwent a sleep study at [facility] sleep at [facility] on 05/03/201 and was not found to have significant obstructive sleep apnea and his AHI and RDI were both 2.9.  He has significant periodic limb movements of sleep and had snoring.  He tells me he had a mouth piece customized by Dr. Oneal Grout for treatment of his snoring. Marland Kitchen  He  has continued to be on losartan 25 mg, Toprol-XL 25 mg, atorvastatin 40 mg and aspirin.  He denies any shortness of breath. He is unaware of palpitations.  He denies exertional dyspnea.  He presents for evaluation.  Past Medical History:  Diagnosis Date  . Allergy   . Hyperlipidemia   . Hypertension   . Substance abuse     Past Surgical History:  Procedure Laterality Date  . INGUINAL HERNIA REPAIR Right 03/10/2015   Procedure: OPEN RIGHT INGUINAL HERNIA REPAIR;  Surgeon: Johnathan Hausen, MD;  Location: [facility];  Service: General;  Laterality: Right;  With MESH  . VASECTOMY       Allergies  Allergen Reactions  . Nexium [Esomeprazole Magnesium]     Awful regurgitation    Current Outpatient Prescriptions  Medication Sig Dispense Refill  . aspirin 325 MG tablet Take 325 mg by mouth daily.    Marland Kitchen atorvastatin (LIPITOR) 40 MG tablet Take at night as instucted 90 tablet 3  . cetirizine (ZYRTEC) 10 MG tablet Take 10 mg by mouth daily.    . Cholecalciferol (VITAMIN D) 2000 units CAPS Take 1 capsule by mouth daily.    Marland Kitchen ibuprofen (ADVIL) 200 MG tablet Take 1 tablet (200 mg total) by mouth every 6 (six) hours as needed. 100 tablet 3  . losartan (COZAAR) 50 MG tablet Take 0.5 tablets (25 mg total) by mouth daily. 90 tablet 3  . metoprolol succinate (TOPROL-XL) 25 MG 24 hr tablet Take 1 tablet (25 mg total) by mouth daily. 90 tablet 3  . tadalafil (CIALIS) 20 MG tablet Use as directed. 6 tablet 11   No current facility-administered medications for this visit.     Socially he is married has 2 children no grandchildren. He does exercise at least 30 minutes at a time. There is a prior history of tobacco use but he quit over 3 years ago. Has a prior use of opiates. 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 He is now working as a Glass blower/designer for [employer].  ROS General: Negative; No fevers, chills, or night sweats;  HEENT: Negative; No changes in vision or hearing, sinus congestion, difficulty swallowing Pulmonary: Negative; No cough, wheezing, shortness of breath, hemoptysis Cardiovascular: See history of present illness GI: Negative; No nausea, vomiting, diarrhea, or abdominal pain GU: Negative; No dysuria, hematuria, or difficulty voiding Musculoskeletal: Negative; no myalgias, joint pain, or weakness Hematologic/Oncology: Negative; no easy bruising, bleeding Endocrine: Negative; no heat/cold intolerance; no diabetes Neuro: Negative; no changes in balance, headaches Skin: Negative; No rashes or skin lesions Psychiatric: Negative; No  behavioral problems, depression Sleep: Sleep study negative for OSA but suspicious for increased upper airway resistance.  He did have periodic limb movements that were frequent.  No hypnogognic hallucinations, no cataplexy Other comprehensive 14 point system review is negative.  PE BP 128/82   Pulse (!) 57   Ht 6' 2.5" (1.892 m)   Wt 221 lb 6.4 oz (100.4 kg)   BMI 28.05 kg/m    Repeat blood pressure by me was 124/78  Wt Readings from Last 3 Encounters:  04/19/16 221 lb 6.4 oz (100.4 kg)  01/13/16 217 lb 3.2 oz (98.5 kg)  10/11/15 223 lb (101.2 kg)   General: Alert, oriented, no distress.  Skin: normal turgor, no rashes HEENT: Normocephalic, atraumatic. Pupils round and reactive; sclera anicteric;no lid lag.  Nose without nasal septal hypertrophy Mouth/Parynx benign; Mallinpatti scale 2 Neck: No JVD, no carotid bruits with normal carotid upstroke Chest wall: Nontender to palpation Lungs: clear to ausculatation and percussion; no wheezing or rales Heart: RRR, s1 s2 normal 1/6 apical systolic murmur Abdomen: soft, nontender; no hepatosplenomehaly, BS+; abdominal aorta nontender and not dilated by  palpation. Back: No CVA tenderness Pulses 2+ Extremities: no clubbing cyanosis or edema, Homan's sign negative  Neurologic: grossly nonfocal Psychcological: Normal affect and mood  ECG (independently read by me): Sinus bradycardia 57 bpm.  First-degree AV block with a PR interval at 228 ms.  No ST segment changes.  June 2016 ECG (independently read by me): Sinus rhythm with first-degree AV block.  PR interval 264 ms.  QTc interval normal 381 ms.  September 2014 ECG: Sinus bradycardia for the first degree AV block. PR interval  220 ms. QT interval 393 ms.  LABS: BMP Latest Ref Rng & Units 01/13/2016 02/26/2015 10/13/2014  Glucose 65 - 99 mg/dL 104(H) 99 81  BUN 7 - 25 mg/dL 18 23(H) 16  Creatinine 0.70 - 1.25 mg/dL 1.06 0.92 0.91  Sodium 135 - 146 mmol/L 137 140 135  Potassium 3.5 -  5.3 mmol/L 4.2 3.9 4.1  Chloride 98 - 110 mmol/L 105 106 101  CO2 20 - 31 mmol/L '25 25 25  '$ Calcium 8.6 - 10.3 mg/dL 9.0 9.7 9.1   Hepatic Function Latest Ref Rng & Units 01/13/2016 11/30/2015 10/13/2014  Total Protein 6.1 - 8.1 g/dL 6.2 6.3 6.6  Albumin 3.6 - 5.1 g/dL 4.2 4.1 4.4  AST 10 - 35 U/L '18 24 18  '$ ALT 9 - 46 U/L '17 27 18  '$ Alk Phosphatase 40 - 115 U/L 67 70 82  Total Bilirubin 0.2 - 1.2 mg/dL 0.6 0.6 0.5  Bilirubin, Direct <=0.2 mg/dL - 0.1 -   CBC Latest Ref Rng & Units 01/13/2016 02/26/2015 10/13/2014  WBC 4.6 - 10.2 K/uL 6.1 6.2 7.2  Hemoglobin 14.1 - 18.1 g/dL 15.0 14.5 14.5  Hematocrit 43.5 - 53.7 % 42.2(A) 42.9 42.6  Platelets 150 - 400 K/uL - 207 222   Lab Results  Component Value Date   MCV 87.4 01/13/2016   MCV 89.4 02/26/2015   MCV 87.5 10/13/2014   Lab Results  Component Value Date   TSH 1.822 10/13/2014   Lab Results  Component Value Date   HGBA1C 5.9 01/13/2016     Lipid Panel     Component Value Date/Time   CHOL 146 01/13/2016 1005   CHOL 144 03/03/2013 0855   TRIG 50 01/13/2016 1005   TRIG 58 03/03/2013 0855   HDL 54 01/13/2016 1005   HDL 46 03/03/2013 0855   CHOLHDL 2.7 01/13/2016 1005   VLDL 10 01/13/2016 1005   LDLCALC 82 01/13/2016 1005   LDLCALC 86 03/03/2013 0855     RADIOLOGY: No results found.    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  His blood pressure today is stable on his current regimen of Toprol-XL 25 mg in addition to losartan 25 mg.  He also is on lipid lowering therapy with atorvastatin 40 mg. recent LDL cholesterol was 82 and total cholesterol 146 on lab work done by Dr. Everlene Farrier.  His previous  cardiopulmonary met test did demonstrate an asymptomatic ischemic response to the latter phase of exercise. He did have good maximum oxygen consumption. 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  I reviewed recent  laboratory.  His primary physician, Dr. Ivar Bury has recently retired and he will be reestablishing with Dr. Eilleen Kempf.  I have recommended he reduce his aspirin from 325 mg daily to just 81 mg daily.  I reviewed his sleep study in detail.  I suspect he  has increased upper airway resistance syndrome but did not meet criteria for obstructive sleep apnea.  He has significant snoring and this has resolved with utilization of a custom designed mouth piece by Dr. Oneal Grout.  He continues to exercise and I have recommended if possible that he resume at least 5 days per week.  His BMI today is 28.05.  As long as he remains stable I will see him in one year for reevaluation.  Time spent: 25 minutes  Troy Sine, MD, [facility]  04/22/2016 2:50 PM

## 2016-04-24 DIAGNOSIS — H903 Sensorineural hearing loss, bilateral: Secondary | ICD-10-CM | POA: Diagnosis not present

## 2016-08-11 ENCOUNTER — Other Ambulatory Visit: Payer: Self-pay | Admitting: Internal Medicine

## 2016-08-11 DIAGNOSIS — J111 Influenza due to unidentified influenza virus with other respiratory manifestations: Secondary | ICD-10-CM | POA: Insufficient documentation

## 2016-08-11 MED ORDER — OSELTAMIVIR PHOSPHATE 75 MG PO CAPS: 75.0000 mg | ORAL_CAPSULE | Freq: Two times a day (BID) | ORAL | 0 refills | Status: AC

## 2016-08-29 ENCOUNTER — Encounter: Payer: Self-pay | Admitting: Internal Medicine

## 2016-08-29 ENCOUNTER — Ambulatory Visit (INDEPENDENT_AMBULATORY_CARE_PROVIDER_SITE_OTHER): Payer: PPO | Admitting: Internal Medicine

## 2016-08-29 VITALS — BP 114/70 | HR 57 | Temp 98.2°F | Resp 16 | Ht 74.5 in | Wt 222.0 lb

## 2016-08-29 DIAGNOSIS — E785 Hyperlipidemia, unspecified: Secondary | ICD-10-CM | POA: Diagnosis not present

## 2016-08-29 DIAGNOSIS — I10 Essential (primary) hypertension: Secondary | ICD-10-CM

## 2016-08-29 NOTE — Progress Notes (Signed)
Subjective:  Patient ID: Clarence Ware, male    DOB: 1951-06-10  Age: 66 y.o. MRN: [id number]  CC: Hypertension and Hyperlipidemia   HPI Clarence Ware presents for establishing as a new pt - his BP has been well controlled. He feels well and offers no complaints.  History Clarence Ware has a past medical history of Allergy; Hyperlipidemia; Hypertension; and Substance abuse.   He has a past surgical history that includes Vasectomy and Inguinal hernia repair (Right, 03/10/2015).   His family history includes Cancer in his brother; Colon cancer in his paternal grandmother; Coronary artery disease (age of onset: 21) in his brother; Diabetes in his brother; Heart disease in his brother and mother; Heart disease (age of onset: 13) in his father; Hyperlipidemia in his brother and sister; Hypertension in his brother and father; Stroke in his mother.He reports that he quit smoking about 24 years ago. He has a 0.25 pack-year smoking history. He has never used smokeless tobacco. He reports that he does not drink alcohol or use drugs.  Outpatient Medications Prior to Visit  Medication Sig Dispense Refill  . aspirin 325 MG tablet Take 81 mg by mouth daily.     Marland Kitchen atorvastatin (LIPITOR) 40 MG tablet Take at night as instucted (Patient taking differently: 20 mg. Take at night as instucted) 90 tablet 3  . cetirizine (ZYRTEC) 10 MG tablet Take 10 mg by mouth daily.    . Cholecalciferol (VITAMIN D) 2000 units CAPS Take 1 capsule by mouth daily.    Marland Kitchen ibuprofen (ADVIL) 200 MG tablet Take 1 tablet (200 mg total) by mouth every 6 (six) hours as needed. 100 tablet 3  . losartan (COZAAR) 50 MG tablet Take 0.5 tablets (25 mg total) by mouth daily. 90 tablet 3  . metoprolol succinate (TOPROL-XL) 25 MG 24 hr tablet Take 1 tablet (25 mg total) by mouth daily. 90 tablet 3  . tadalafil (CIALIS) 20 MG tablet Use as directed. 6 tablet 11   No facility-administered medications prior to visit.     ROS Review of  Systems  Constitutional: Negative for appetite change, diaphoresis, fatigue and unexpected weight change.  HENT: Negative.  Negative for trouble swallowing.   Eyes: Negative for visual disturbance.  Respiratory: Negative for cough, chest tightness, shortness of breath and wheezing.   Cardiovascular: Negative for chest pain, palpitations and leg swelling.  Gastrointestinal: Negative for abdominal pain, constipation, diarrhea, nausea and vomiting.  Endocrine: Negative.   Genitourinary: Negative.  Negative for difficulty urinating.  Musculoskeletal: Negative for back pain, myalgias and neck pain.  Skin: Negative.  Negative for color change and rash.  Allergic/Immunologic: Negative.   Neurological: Negative.  Negative for dizziness, weakness and numbness.  Hematological: Negative for adenopathy. Does not bruise/bleed easily.  Psychiatric/Behavioral: Negative.     Objective:  BP 114/70 (BP Location: Left Arm, Patient Position: Sitting, Cuff Size: Normal)   Pulse (!) 57   Temp 98.2 F (36.8 C)   Resp 16   Ht 6' 2.5" (1.892 m)   Wt 222 lb 0.6 oz (100.7 kg)   SpO2 98%   BMI 28.13 kg/m   Physical Exam  Constitutional: He is oriented to person, place, and time. No distress.  HENT:  Mouth/Throat: Oropharynx is clear and moist. No oropharyngeal exudate.  Eyes: Conjunctivae are normal. Right eye exhibits no discharge. Left eye exhibits no discharge. No scleral icterus.  Neck: Normal range of motion. Neck supple. No JVD present. No tracheal deviation present. No thyromegaly present.  Cardiovascular: Normal rate,  regular rhythm, normal heart sounds and intact distal pulses.  Exam reveals no gallop and no friction rub.   No murmur heard. Pulmonary/Chest: Breath sounds normal. No stridor. No respiratory distress. He has no wheezes. He has no rales. He exhibits no tenderness.  Abdominal: Soft. Bowel sounds are normal. He exhibits no distension and no mass. There is no tenderness. There is no  rebound and no guarding.  Musculoskeletal: Normal range of motion. He exhibits no edema, tenderness or deformity.  Lymphadenopathy:    He has no cervical adenopathy.  Neurological: He is oriented to person, place, and time.  Skin: Skin is warm and dry. No rash noted. He is not diaphoretic. No erythema. No pallor.  Vitals reviewed.   Lab Results  Component Value Date   WBC 6.1 01/13/2016   HGB 15.0 01/13/2016   HCT 42.2 (A) 01/13/2016   PLT 207 02/26/2015   GLUCOSE 104 (H) 01/13/2016   CHOL 146 01/13/2016   TRIG 50 01/13/2016   HDL 54 01/13/2016   LDLCALC 82 01/13/2016   ALT 17 01/13/2016   AST 18 01/13/2016   NA 137 01/13/2016   K 4.2 01/13/2016   CL 105 01/13/2016   CREATININE 1.06 01/13/2016   BUN 18 01/13/2016   CO2 25 01/13/2016   TSH 1.822 10/13/2014   PSA 1.67 01/13/2016   HGBA1C 5.9 01/13/2016    Assessment & Plan:   Soryn was seen today for hypertension and hyperlipidemia.  Diagnoses and all orders for this visit:  Hyperlipidemia with target LDL less than 100- he has achieved his LDL goal and is doing well on the statin  HYPERTENSION, BENIGN SYSTEMIC  Essential hypertension- his BP is well controlled   I am having Clarence Ware maintain his ibuprofen, aspirin, atorvastatin, losartan, metoprolol succinate, tadalafil, cetirizine, and Vitamin D.  No orders of the defined types were placed in this encounter.    Follow-up: Return in about 4 months (around 12/27/2016).  [name], MD

## 2016-08-29 NOTE — Progress Notes (Signed)
Pre visit review using our clinic review tool, if applicable. No additional management support is needed unless otherwise documented below in the visit note. 

## 2016-08-29 NOTE — Patient Instructions (Signed)
Hypertension Hypertension, commonly called high blood pressure, is when the force of blood pumping through your arteries is too strong. Your arteries are the blood vessels that carry blood from your heart throughout your body. A blood pressure reading consists of a higher number over a lower number, such as 110/72. The higher number (systolic) is the pressure inside your arteries when your heart pumps. The lower number (diastolic) is the pressure inside your arteries when your heart relaxes. Ideally you want your blood pressure below 120/80. Hypertension forces your heart to work harder to pump blood. Your arteries may become narrow or stiff. Having untreated or uncontrolled hypertension can cause heart attack, stroke, kidney disease, and other problems. What increases the risk? Some risk factors for high blood pressure are controllable. Others are not. Risk factors you cannot control include:  Race. You may be at higher risk if you are African American.  Age. Risk increases with age.  Gender. Men are at higher risk than women before age 45 years. After age 65, women are at higher risk than men. Risk factors you can control include:  Not getting enough exercise or physical activity.  Being overweight.  Getting too much fat, sugar, calories, or salt in your diet.  Drinking too much alcohol. What are the signs or symptoms? Hypertension does not usually cause signs or symptoms. Extremely high blood pressure (hypertensive crisis) may cause headache, anxiety, shortness of breath, and nosebleed. How is this diagnosed? To check if you have hypertension, your health care provider will measure your blood pressure while you are seated, with your arm held at the level of your heart. It should be measured at least twice using the same arm. Certain conditions can cause a difference in blood pressure between your right and left arms. A blood pressure reading that is higher than normal on one occasion does  not mean that you need treatment. If it is not clear whether you have high blood pressure, you may be asked to return on a different day to have your blood pressure checked again. Or, you may be asked to monitor your blood pressure at home for 1 or more weeks. How is this treated? Treating high blood pressure includes making lifestyle changes and possibly taking medicine. Living a healthy lifestyle can help lower high blood pressure. You may need to change some of your habits. Lifestyle changes may include:  Following the DASH diet. This diet is high in fruits, vegetables, and whole grains. It is low in salt, red meat, and added sugars.  Keep your sodium intake below 2,300 mg per day.  Getting at least 30-45 minutes of aerobic exercise at least 4 times per week.  Losing weight if necessary.  Not smoking.  Limiting alcoholic beverages.  Learning ways to reduce stress. Your health care provider may prescribe medicine if lifestyle changes are not enough to get your blood pressure under control, and if one of the following is true:  You are 18-59 years of age and your systolic blood pressure is above 140.  You are 60 years of age or older, and your systolic blood pressure is above 150.  Your diastolic blood pressure is above 90.  You have diabetes, and your systolic blood pressure is over 140 or your diastolic blood pressure is over 90.  You have kidney disease and your blood pressure is above 140/90.  You have heart disease and your blood pressure is above 140/90. Your personal target blood pressure may vary depending on your medical   conditions, your age, and other factors. Follow these instructions at home:  Have your blood pressure rechecked as directed by your health care provider.  Take medicines only as directed by your health care provider. Follow the directions carefully. Blood pressure medicines must be taken as prescribed. The medicine does not work as well when you skip  doses. Skipping doses also puts you at risk for problems.  Do not smoke.  Monitor your blood pressure at home as directed by your health care provider. Contact a health care provider if:  You think you are having a reaction to medicines taken.  You have recurrent headaches or feel dizzy.  You have swelling in your ankles.  You have trouble with your vision. Get help right away if:  You develop a severe headache or confusion.  You have unusual weakness, numbness, or feel faint.  You have severe chest or abdominal pain.  You vomit repeatedly.  You have trouble breathing. This information is not intended to replace advice given to you by your health care provider. Make sure you discuss any questions you have with your health care provider. Document Released: 06/26/2005 Document Revised: 12/02/2015 Document Reviewed: 04/18/2013 Elsevier Interactive Patient Education  2017 [employer]  

## 2017-01-11 DIAGNOSIS — H5211 Myopia, right eye: Secondary | ICD-10-CM | POA: Diagnosis not present

## 2017-01-11 DIAGNOSIS — H2513 Age-related nuclear cataract, bilateral: Secondary | ICD-10-CM | POA: Diagnosis not present

## 2017-01-11 DIAGNOSIS — H35372 Puckering of macula, left eye: Secondary | ICD-10-CM | POA: Diagnosis not present

## 2017-01-18 ENCOUNTER — Encounter: Payer: Self-pay | Admitting: Internal Medicine

## 2017-01-18 ENCOUNTER — Ambulatory Visit (INDEPENDENT_AMBULATORY_CARE_PROVIDER_SITE_OTHER): Payer: PPO | Admitting: Internal Medicine

## 2017-01-18 ENCOUNTER — Other Ambulatory Visit (INDEPENDENT_AMBULATORY_CARE_PROVIDER_SITE_OTHER): Payer: PPO

## 2017-01-18 VITALS — BP 128/88 | HR 79 | Temp 98.1°F | Resp 16 | Ht 74.5 in | Wt 222.0 lb

## 2017-01-18 DIAGNOSIS — N400 Enlarged prostate without lower urinary tract symptoms: Secondary | ICD-10-CM

## 2017-01-18 DIAGNOSIS — I440 Atrioventricular block, first degree: Secondary | ICD-10-CM

## 2017-01-18 DIAGNOSIS — E785 Hyperlipidemia, unspecified: Secondary | ICD-10-CM

## 2017-01-18 DIAGNOSIS — Z0000 Encounter for general adult medical examination without abnormal findings: Secondary | ICD-10-CM | POA: Diagnosis not present

## 2017-01-18 DIAGNOSIS — K219 Gastro-esophageal reflux disease without esophagitis: Secondary | ICD-10-CM

## 2017-01-18 DIAGNOSIS — I10 Essential (primary) hypertension: Secondary | ICD-10-CM

## 2017-01-18 DIAGNOSIS — Z23 Encounter for immunization: Secondary | ICD-10-CM

## 2017-01-18 LAB — URINALYSIS, ROUTINE W REFLEX MICROSCOPIC
BILIRUBIN URINE: NEGATIVE
HGB URINE DIPSTICK: NEGATIVE
Ketones, ur: NEGATIVE
Leukocytes, UA: NEGATIVE
NITRITE: NEGATIVE
PH: 7 (ref 5.0–8.0)
RBC / HPF: NONE SEEN (ref 0–?)
Specific Gravity, Urine: 1.01 (ref 1.000–1.030)
TOTAL PROTEIN, URINE-UPE24: NEGATIVE
URINE GLUCOSE: NEGATIVE
Urobilinogen, UA: 0.2 (ref 0.0–1.0)

## 2017-01-18 LAB — COMPREHENSIVE METABOLIC PANEL
ALBUMIN: 4.1 g/dL (ref 3.5–5.2)
ALT: 19 U/L (ref 0–53)
AST: 19 U/L (ref 0–37)
Alkaline Phosphatase: 74 U/L (ref 39–117)
BUN: 21 mg/dL (ref 6–23)
CALCIUM: 9.8 mg/dL (ref 8.4–10.5)
CHLORIDE: 106 meq/L (ref 96–112)
CO2: 28 meq/L (ref 19–32)
CREATININE: 1 mg/dL (ref 0.40–1.50)
GFR: 79.33 mL/min (ref >60.00)
Glucose, Bld: 110 mg/dL — ABNORMAL HIGH (ref 70–99)
Potassium: 4.7 mEq/L (ref 3.5–5.1)
Sodium: 139 mEq/L (ref 135–145)
Total Bilirubin: 0.7 mg/dL (ref 0.2–1.2)
Total Protein: 6.3 g/dL (ref 6.0–8.3)

## 2017-01-18 LAB — LIPID PANEL
CHOL/HDL RATIO: 3
CHOLESTEROL: 146 mg/dL (ref 0–200)
HDL: 42.8 mg/dL (ref >39.00)
LDL CALC: 90 mg/dL (ref 0–99)
NonHDL: 103.44
TRIGLYCERIDES: 68 mg/dL (ref 0.0–149.0)
VLDL: 13.6 mg/dL (ref 0.0–40.0)

## 2017-01-18 LAB — CBC WITH DIFFERENTIAL/PLATELET
BASOS ABS: 0 10*3/uL (ref 0.0–0.1)
BASOS PCT: 0.4 % (ref 0.0–3.0)
EOS ABS: 0.3 10*3/uL (ref 0.0–0.7)
Eosinophils Relative: 4.9 % (ref 0.0–5.0)
HEMATOCRIT: 41.9 % (ref 39.0–52.0)
HEMOGLOBIN: 14.4 g/dL (ref 13.0–17.0)
LYMPHS PCT: 25.8 % (ref 12.0–46.0)
Lymphs Abs: 1.5 10*3/uL (ref 0.7–4.0)
MCHC: 34.4 g/dL (ref 30.0–36.0)
MCV: 89.4 fl (ref 78.0–100.0)
MONOS PCT: 12.4 % — AB (ref 3.0–12.0)
Monocytes Absolute: 0.7 10*3/uL (ref 0.1–1.0)
NEUTROS ABS: 3.4 10*3/uL (ref 1.4–7.7)
Neutrophils Relative %: 56.5 % (ref 43.0–77.0)
Platelets: 200 10*3/uL (ref 150.0–400.0)
RBC: 4.69 Mil/uL (ref 4.22–5.81)
RDW: 13.1 % (ref 11.5–15.5)
WBC: 5.9 10*3/uL (ref 4.0–10.5)

## 2017-01-18 LAB — TSH: TSH: 1.6 u[IU]/mL (ref 0.35–4.50)

## 2017-01-18 LAB — PSA: PSA: 1.81 ng/mL (ref 0.10–4.00)

## 2017-01-18 MED ORDER — ATORVASTATIN CALCIUM 20 MG PO TABS: 20.0000 mg | ORAL_TABLET | Freq: Every day | ORAL | 3 refills | Status: DC

## 2017-01-18 MED ORDER — LOSARTAN POTASSIUM 25 MG PO TABS: 25.0000 mg | ORAL_TABLET | Freq: Every day | ORAL | 3 refills | Status: DC

## 2017-01-18 MED ORDER — ZOSTER VAC RECOMB ADJUVANTED 50 MCG/0.5ML IM SUSR: 0.5000 mL | Freq: Once | INTRAMUSCULAR | 1 refills | Status: AC

## 2017-01-18 NOTE — Patient Instructions (Signed)

## 2017-01-18 NOTE — Progress Notes (Signed)
Subjective:  Patient ID: Clarence Ware, male    DOB: Nov 24, 1950  Age: 66 y.o. MRN: [id number]  CC: Annual Exam; Hypertension; and Hyperlipidemia   HPI [name] presents for a CPX - he feels well, offers no complaints.  Past Medical History:  Diagnosis Date  . Allergy   . Hyperlipidemia   . Hypertension   . Substance abuse    Past Surgical History:  Procedure Laterality Date  . INGUINAL HERNIA REPAIR Right 03/10/2015   Procedure: OPEN RIGHT INGUINAL HERNIA REPAIR;  Surgeon: Johnathan Hausen, MD;  Location: [facility];  Service: General;  Laterality: Right;  With MESH  . VASECTOMY      reports that he quit smoking about 24 years ago. He has a 0.25 pack-year smoking history. He has never used smokeless tobacco. He reports that he does not drink alcohol or use drugs. family history includes Cancer in his brother; Colon cancer in his paternal grandmother; Coronary artery disease (age of onset: 31) in his brother; Diabetes in his brother; Heart disease in his brother and mother; Heart disease (age of onset: 43) in his father; Hyperlipidemia in his brother and sister; Hypertension in his brother and father; Stroke in his mother. Allergies  Allergen Reactions  . Nexium [Esomeprazole Magnesium]     Awful regurgitation    Outpatient Medications Prior to Visit  Medication Sig Dispense Refill  . cetirizine (ZYRTEC) 10 MG tablet Take 10 mg by mouth daily.    . Cholecalciferol (VITAMIN D) 2000 units CAPS Take 1 capsule by mouth daily.    Marland Kitchen ibuprofen (ADVIL) 200 MG tablet Take 1 tablet (200 mg total) by mouth every 6 (six) hours as needed. 100 tablet 3  . metoprolol succinate (TOPROL-XL) 25 MG 24 hr tablet Take 1 tablet (25 mg total) by mouth daily. 90 tablet 3  . tadalafil (CIALIS) 20 MG tablet Use as directed. 6 tablet 11  . atorvastatin (LIPITOR) 40 MG tablet Take at night as instucted (Patient taking differently: 20 mg. Take at night as instucted) 90 tablet 3  . losartan (COZAAR) 50  MG tablet Take 0.5 tablets (25 mg total) by mouth daily. 90 tablet 3  . aspirin 325 MG tablet Take 81 mg by mouth daily.      No facility-administered medications prior to visit.     ROS Review of Systems  Constitutional: Negative for diaphoresis, fatigue and unexpected weight change.  HENT: Negative.   Eyes: Negative for visual disturbance.  Respiratory: Negative.  Negative for apnea, cough, chest tightness, shortness of breath and wheezing.   Cardiovascular: Negative for chest pain, palpitations and leg swelling.  Gastrointestinal: Negative for abdominal pain, blood in stool, constipation, diarrhea, nausea and vomiting.  Endocrine: Negative.   Genitourinary: Negative.   Musculoskeletal: Negative.  Negative for back pain, myalgias and neck pain.  Skin: Negative.  Negative for color change and rash.  Allergic/Immunologic: Negative.   Neurological: Negative.   Hematological: Negative.  Negative for adenopathy. Does not bruise/bleed easily.  Psychiatric/Behavioral: Negative.     Objective:  BP 128/88 (BP Location: Left Arm, Patient Position: Sitting, Cuff Size: Normal)   Pulse 79   Temp 98.1 F (36.7 C) (Oral)   Resp 16   Ht 6' 2.5" (1.892 m)   Wt 222 lb (100.7 kg)   SpO2 99%   BMI 28.12 kg/m   BP Readings from Last 3 Encounters:  01/18/17 128/88  08/29/16 114/70  04/19/16 128/82    Wt Readings from Last 3 Encounters:  01/18/17 222  lb (100.7 kg)  08/29/16 222 lb 0.6 oz (100.7 kg)  04/19/16 221 lb 6.4 oz (100.4 kg)    Physical Exam  Constitutional: He is oriented to person, place, and time. No distress.  HENT:  Mouth/Throat: Oropharynx is clear and moist. No oropharyngeal exudate.  Eyes: Conjunctivae are normal. Right eye exhibits no discharge. Left eye exhibits no discharge. No scleral icterus.  Neck: Normal range of motion. Neck supple. No JVD present. No thyromegaly present.  Cardiovascular: Normal rate, regular rhythm and intact distal pulses.  Exam reveals no  gallop and no friction rub.   No murmur heard. EKG--  Sinus  Bradycardia  -First degree A-V block  PRi = 240 BORDERLINE RHYTHM- no changes compared to the prior EKG   Pulmonary/Chest: Effort normal and breath sounds normal. No respiratory distress. He has no wheezes. He has no rales. He exhibits no tenderness.  Abdominal: Soft. Bowel sounds are normal. He exhibits no distension and no mass. There is no tenderness. There is no rebound and no guarding.  Genitourinary:  Genitourinary Comments: GU and rectal exams were deferred at his request. He prefers to have these done when he sees his urologist in 2 months.  Musculoskeletal: Normal range of motion. He exhibits no edema, tenderness or deformity.  Lymphadenopathy:    He has no cervical adenopathy.  Neurological: He is alert and oriented to person, place, and time.  Skin: Skin is warm and dry. No rash noted. He is not diaphoretic. No erythema. No pallor.  Psychiatric: He has a normal mood and affect. His behavior is normal. Judgment and thought content normal.  Vitals reviewed.   Lab Results  Component Value Date   WBC 6.1 01/13/2016   HGB 15.0 01/13/2016   HCT 42.2 (A) 01/13/2016   PLT 207 02/26/2015   GLUCOSE 104 (H) 01/13/2016   CHOL 146 01/13/2016   TRIG 50 01/13/2016   HDL 54 01/13/2016   LDLCALC 82 01/13/2016   ALT 17 01/13/2016   AST 18 01/13/2016   NA 137 01/13/2016   K 4.2 01/13/2016   CL 105 01/13/2016   CREATININE 1.06 01/13/2016   BUN 18 01/13/2016   CO2 25 01/13/2016   TSH 1.822 10/13/2014   PSA 1.67 01/13/2016   HGBA1C 5.9 01/13/2016    No results found.  Assessment & Plan:   Durant was seen today for annual exam, hypertension and hyperlipidemia.  Diagnoses and all orders for this visit:  Essential hypertension- His blood pressure is well-controlled, electrolytes and renal function are normal, will continue the current regimen. His EKG is negative for any evidence of ischemia or LVH. -      Comprehensive metabolic panel; Future -     Urinalysis, Routine w reflex microscopic; Future -     EKG 12-Lead -     losartan (COZAAR) 25 MG tablet; Take 1 tablet (25 mg total) by mouth daily.  First degree heart block- this has not changed over the last year and he is asymptomatic.  -     EKG 12-Lead  Gastroesophageal reflux disease without esophagitis -     CBC with Differential/Platelet; Future  Benign prostatic hyperplasia without lower urinary tract symptoms -     PSA; Future  Hyperlipidemia with target LDL less than 100- he has achieved his LDL goal is doing well on the statin. -     Lipid panel; Future -     TSH; Future -     atorvastatin (LIPITOR) 20 MG tablet; Take 1 tablet (  20 mg total) by mouth daily.  Routine general medical examination at a health care facility -     Zoster Vac Recomb Adjuvanted [facility]) injection; Inject 0.5 mLs into the muscle once.  Need for vaccination for Strep pneumoniae -     Pneumococcal polysaccharide vaccine 23-valent greater than or equal to 67yo subcutaneous/IM   I have discontinued Mr. Isabell aspirin, atorvastatin, and losartan. I am also having him start on Zoster Vac Recomb Adjuvanted, atorvastatin, and losartan. Additionally, I am having him maintain his ibuprofen, metoprolol succinate, tadalafil, cetirizine, Vitamin D, and aspirin EC.  Meds ordered this encounter  Medications  . aspirin EC 81 MG tablet    Sig: Take 81 mg by mouth daily.  Marland Kitchen Zoster Vac Recomb Adjuvanted [facility]) injection    Sig: Inject 0.5 mLs into the muscle once.    Dispense:  1 each    Refill:  1  . atorvastatin (LIPITOR) 20 MG tablet    Sig: Take 1 tablet (20 mg total) by mouth daily.    Dispense:  90 tablet    Refill:  3  . losartan (COZAAR) 25 MG tablet    Sig: Take 1 tablet (25 mg total) by mouth daily.    Dispense:  90 tablet    Refill:  3   See AVS for instructions about healthy living and anticipatory guidance.  Follow-up: Return in about 1  year (around 01/18/2018).  [name], MD

## 2017-01-19 NOTE — Assessment & Plan Note (Signed)

## 2017-02-18 ENCOUNTER — Other Ambulatory Visit: Payer: Self-pay | Admitting: Emergency Medicine

## 2017-02-18 DIAGNOSIS — I10 Essential (primary) hypertension: Secondary | ICD-10-CM

## 2017-02-19 ENCOUNTER — Other Ambulatory Visit: Payer: Self-pay | Admitting: Emergency Medicine

## 2017-02-19 DIAGNOSIS — I10 Essential (primary) hypertension: Secondary | ICD-10-CM

## 2017-02-21 ENCOUNTER — Other Ambulatory Visit: Payer: Self-pay | Admitting: Internal Medicine

## 2017-02-21 DIAGNOSIS — I10 Essential (primary) hypertension: Secondary | ICD-10-CM

## 2017-02-21 MED ORDER — METOPROLOL SUCCINATE ER 25 MG PO TB24: 25.0000 mg | ORAL_TABLET | Freq: Every day | ORAL | 3 refills | Status: DC

## 2017-07-16 DIAGNOSIS — R350 Frequency of micturition: Secondary | ICD-10-CM | POA: Diagnosis not present

## 2017-07-16 DIAGNOSIS — N400 Enlarged prostate without lower urinary tract symptoms: Secondary | ICD-10-CM | POA: Diagnosis not present

## 2017-07-16 DIAGNOSIS — R3915 Urgency of urination: Secondary | ICD-10-CM | POA: Diagnosis not present

## 2017-07-18 ENCOUNTER — Encounter (INDEPENDENT_AMBULATORY_CARE_PROVIDER_SITE_OTHER): Payer: Self-pay

## 2017-07-18 ENCOUNTER — Encounter: Payer: Self-pay | Admitting: Cardiovascular Disease

## 2017-07-18 ENCOUNTER — Ambulatory Visit: Payer: PPO | Admitting: Cardiovascular Disease

## 2017-07-18 VITALS — BP 122/82 | HR 63 | Ht 72.0 in | Wt 225.6 lb

## 2017-07-18 DIAGNOSIS — E785 Hyperlipidemia, unspecified: Secondary | ICD-10-CM | POA: Diagnosis not present

## 2017-07-18 DIAGNOSIS — I10 Essential (primary) hypertension: Secondary | ICD-10-CM | POA: Diagnosis not present

## 2017-07-18 DIAGNOSIS — I440 Atrioventricular block, first degree: Secondary | ICD-10-CM | POA: Diagnosis not present

## 2017-07-18 DIAGNOSIS — Z79899 Other long term (current) drug therapy: Secondary | ICD-10-CM

## 2017-07-18 MED ORDER — ATORVASTATIN CALCIUM 40 MG PO TABS: 40.0000 mg | ORAL_TABLET | Freq: Every day | ORAL | 3 refills | Status: DC

## 2017-07-18 NOTE — Progress Notes (Signed)
Patient ID: Clarence Ware, male   DOB: August 21, 1950, 67 y.o.   MRN: [id number]    Primary M.D.: Dr. Ivar Bury  HPI: Clarence Ware is a 67 y.o. male presents to the office today for a 15 month followup cardiology evaluation.  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  Recently, another brother has a chronic aortic dissection for which she's been treated medically.  Clarence Ware has a history of hyperlipidemia and hypertension.  Over 10 years ago he had a normal routine treadmill test. The patient has continued to exercise regularly. There is a prior history of substance abuse for which he did undergo treatment.  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  He  has a history of hyperlipidemia, erectile dysfunction, a history of intermittent headaches.  When I saw him on 11/07/2012, we discussed the sensitivity and specificity of routine treadmill testing with a strong family history I elected to schedule him for a cardiopulmonary met test for further evaluation would also be helpful to assess endothelial dysfunction/diastolic dysfunction evaluate potential for microvascular angina. He also had a soft cardiac murmur at his apex and recommended a 2-D echo Doppler study.  His cardiopulmonary met test revealed that he had good functional capacity with an excellent maximum oxygen consumption peak of 92% of predicted. However, he had a abnormal response during the last several minutes of exercise suggesting ischemic myocardial dysfunction with decreasing stroke volume with increasing work rate which corresponded to an abrupt decrease in cardiac output suggestive of exercise-induced myocardial dysfunction. He did have very mild ventilation/perfusion mismatch  with reference to his pulmonary status.  A 2-D echo Doppler study showed normal systolic function with an ejection fraction of 55-60%. He had normal diastolic parameters.  When I saw him in followup of his noninvasive studies in June 2014 due to the mild abnormality noted during the last 7 minutes of exercise I recommended the addition of ARB therapy and further titrated his atorvastatin for aggressive lipid management. He has tolerated initiation of losartan.  I last saw him on most 2 years ago.  He denied any  chest pain or palpitations. He He did have a followup NMR lipoprotein of which showed an LDL particle number of 1216 with a calculated LDL of 86, HDL 46, triglycerides 58, total cholesterol 144. He had 552 small LDL some particles. HDL particle number was reduced at 29.1. Insulin resistance score was normal at 33.  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  However, he is still walking at least 3 days per week for a minimum of 30 minutes.  He underwent a sleep study at [facility] sleep at [facility] on 05/03/201 and was not found to have significant obstructive sleep apnea and his AHI and RDI were both 2.9.  He has significant periodic limb movements of sleep and had snoring.  He tells me he had a mouth piece customized by Dr. Oneal Grout for treatment of his snoring.   As I last saw him in October 2017.  He denies any episodes of chest pain or shortness of breath.  He is unaware of palpitations.  He  uses his mouthguard to sleep and denies any awareness of breakthrough snoring.  He continues to be on losartan 25 mg daily, and metoprolol succinate 25 mg daily for hypertension.  He has continued to be on atorvastatin 20 mg.  Of note, laboratory 6 months ago showed an LDL cholesterol, which had risen up to 90.  He takes dialysis as needed.  He presents for reevaluation.  Past Medical History:  Diagnosis Date   . Allergy   . Hyperlipidemia   . Hypertension   . Substance abuse [facility])     Past Surgical History:  Procedure Laterality Date  . INGUINAL HERNIA REPAIR Right 03/10/2015   Procedure: OPEN RIGHT INGUINAL HERNIA REPAIR;  Surgeon: Johnathan Hausen, MD;  Location: [facility];  Service: General;  Laterality: Right;  With MESH  . VASECTOMY      Allergies  Allergen Reactions  . Nexium [Esomeprazole Magnesium]     Awful regurgitation    Current Outpatient Medications  Medication Sig Dispense Refill  . aspirin EC 81 MG tablet Take 81 mg by mouth daily.    Marland Kitchen atorvastatin (LIPITOR) 40 MG tablet Take 1 tablet (40 mg total) by mouth daily. 90 tablet 3  . cetirizine (ZYRTEC) 10 MG tablet Take 10 mg by mouth as directed.     . Cholecalciferol (VITAMIN D) 2000 units CAPS Take 1 capsule by mouth daily.    Marland Kitchen ibuprofen (ADVIL) 200 MG tablet Take 1 tablet (200 mg total) by mouth every 6 (six) hours as needed. 100 tablet 3  . losartan (COZAAR) 25 MG tablet Take 1 tablet (25 mg total) by mouth daily. 90 tablet 3  . metoprolol succinate (TOPROL-XL) 25 MG 24 hr tablet Take 1 tablet (25 mg total) by mouth daily. 90 tablet 3  . tadalafil (CIALIS) 20 MG tablet Use as directed. 6 tablet 11   No current facility-administered medications for this visit.     Socially he is married has 2 children no grandchildren. He does exercise at least 30 minutes at a time. There is a prior history of tobacco use but he quit over 3 years ago. Has a prior use of opiates. 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 He is now working as a Glass blower/designer for [employer].  ROS General: Negative; No fevers, chills, or night sweats;  HEENT: Negative; No changes in vision or hearing, sinus congestion, difficulty swallowing Pulmonary: Negative; No cough, wheezing, shortness of breath, hemoptysis Cardiovascular: See history of present illness GI: Negative; No nausea, vomiting, diarrhea, or abdominal  pain GU: Negative; No dysuria, hematuria, or difficulty voiding Musculoskeletal: Negative; no myalgias, joint pain, or weakness Hematologic/Oncology: Negative; no easy bruising, bleeding Endocrine: Negative; no heat/cold intolerance; no diabetes Neuro: Negative; no changes in balance, headaches Skin: Negative; No rashes or skin lesions Psychiatric: Negative; No behavioral problems, depression Sleep: Sleep study negative for OSA but suspicious for increased upper airway resistance.  He did have periodic limb movements that were frequent.  No hypnogognic hallucinations, no cataplexy Other comprehensive 14 point system review is negative.  PE BP 122/82   Pulse 63   Ht 6' (1.829 m)   Wt 225 lb 9.6 oz (102.3 kg)   BMI 30.60 kg/m    Repeat blood pressure was 116/74  Wt Readings from Last 3 Encounters:  07/18/17 225 lb 9.6 oz (102.3 kg)  01/18/17 222 lb (100.7 kg)  08/29/16 222 lb 0.6 oz (100.7 kg)   General: Alert, oriented, no distress.  Skin:  normal turgor, no rashes, warm and dry HEENT: Normocephalic, atraumatic. Pupils equal round and reactive to light; sclera anicteric; extraocular muscles intact;  Nose without nasal septal hypertrophy Mouth/Parynx benign; Mallinpatti scale 3 Neck: No JVD, no carotid bruits; normal carotid upstroke Lungs: clear to ausculatation and percussion; no wheezing or rales Chest wall: without tenderness to palpitation Heart: PMI not displaced, RRR, s1 s2 normal, 1/6 systolic murmur, no diastolic murmur, no rubs, gallops, thrills, or heaves Abdomen: soft, nontender; no hepatosplenomehaly, BS+; abdominal aorta nontender and not dilated by palpation. Back: no CVA tenderness Pulses 2+ Musculoskeletal: full range of motion, normal strength, no joint deformities Extremities: no clubbing cyanosis or edema, Homan's sign negative  Neurologic: grossly nonfocal; Cranial nerves grossly wnl Psychologic: Normal mood and affect    ECG (independently read by me):  Normal sinus rhythm with an isolated PVC.  PR interval 202 ms.  Heart rate 63 bpm.  October 2017 ECG (independently read by me): Sinus bradycardia 57 bpm.  First-degree AV block with a PR interval at 228 ms.  No ST segment changes.  June 2016 ECG (independently read by me): Sinus rhythm with first-degree AV block.  PR interval 264 ms.  QTc interval normal 381 ms.  September 2014 ECG: Sinus bradycardia for the first degree AV block. PR interval  220 ms. QT interval 393 ms.  LABS: BMP Latest Ref Rng & Units 01/18/2017 01/13/2016 02/26/2015  Glucose 70 - 99 mg/dL 110(H) 104(H) 99  BUN 6 - 23 mg/dL 21 18 23(H)  Creatinine 0.40 - 1.50 mg/dL 1.00 1.06 0.92  Sodium 135 - 145 mEq/L 139 137 140  Potassium 3.5 - 5.1 mEq/L 4.7 4.2 3.9  Chloride 96 - 112 mEq/L 106 105 106  CO2 19 - 32 mEq/L _0 Calcium 8.4 - 10.5 mg/dL 9.8 9.0 9.7   Hepatic Function Latest Ref Rng & Units 01/18/2017 01/13/2016 11/30/2015  Total Protein 6.0 - 8.3 g/dL 6.3 6.2 6.3  Albumin 3.5 - 5.2 g/dL 4.1 4.2 4.1  AST 0 - 37 U/L _1 ALT 0 - 53 U/L _2 Alk Phosphatase 39 - 117 U/L 74 67 70  Total Bilirubin 0.2 - 1.2 mg/dL 0.7 0.6 0.6  Bilirubin, Direct <=0.2 mg/dL - - 0.1   CBC Latest Ref Rng & Units 01/18/2017 01/13/2016 02/26/2015  WBC 4.0 - 10.5 K/uL 5.9 6.1 6.2  Hemoglobin 13.0 - 17.0 g/dL 14.4 15.0 14.5  Hematocrit 39.0 - 52.0 % 41.9 42.2(A) 42.9  Platelets 150.0 - 400.0 K/uL 200.0 - 207   Lab Results  Component Value Date   MCV 89.4 01/18/2017   MCV 87.4 01/13/2016   MCV 89.4 02/26/2015   Lab Results  Component Value Date   TSH 1.60 01/18/2017   Lab Results  Component Value Date   HGBA1C 5.9 01/13/2016     Lipid Panel     Component Value Date/Time   CHOL 146 01/18/2017 0932   CHOL 144 03/03/2013 0855   TRIG 68.0 01/18/2017 0932   TRIG 58 03/03/2013 0855   HDL 42.80 01/18/2017 0932   HDL 46 03/03/2013 0855   CHOLHDL 3 01/18/2017 0932   VLDL 13.6 01/18/2017 0932   LDLCALC 90 01/18/2017 0932    LDLCALC 86 03/03/2013 0855     RADIOLOGY: No results found.  IMPRESSION:  1. Essential hypertension, benign   2. Hyperlipidemia with target LDL less than 70   3. First degree AV block   4. Medication management  ASSESSMENT AND PLAN: Clarence. 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  His blood pressure continues to be stable on combination therapy with Toprol-XL 25 mg and low-dose losartan at 25 mg.  There is a significant family history for cardiovascular disease with a father who died at age 84 following an MI, and over brother who underwent CABG revascularization surgery, and recently another brother was diagnosed with an aortic dissection.  His ECG remained stable with borderline first-degree AV block.  I reviewed his most recent lipid studies from 6 months ago.  His LDL cholesterol had risen up to 90.  With his significant family history for CAD.  I have recommended target LDL be less than 70 rather than less than 100, and discussed with him in detail the recent trials with PCS canine inhibition regarding improved outcome and coronary regression even when compared to patients with LDLs in the 90s.  I have recommended further titration of atorvastatin up to 40 mg.  In several months.  Repeat laboratory will be obtained and if his LDL remains greater than 70 to discuss options including increasing atorvastatin 80 mg, continuing at 40 mg with the addition of Zetia 10 mg, or changing to Crestor 40 mg.  We discussed the importance of continued exercise.  Ideally 5 days per week for at least 30 minutes.  We discussed mild weight loss with his BMI 30.6, placing him in the borderline obesity category.  His obstructive sleep apnea seems to be well treated with his customized oral appliance.  His previous significant snoring has resolved.  He denies residual daytime sleepiness. I will see him in 6 months for reevaluation.     Time spent: 25 minutes  Troy Sine, MD, [facility]  07/19/2017 6:14 PM

## 2017-07-18 NOTE — Patient Instructions (Signed)
Medication Instructions:  INCREASE atorvastatin (Lipitor) to 40 mg daily  Labwork: Please return for FASTING labs in 3 months (CMET, CBC, Lipid, TSH)  Follow-Up: Your physician recommends that you schedule a follow-up appointment in: End of June with Dr. Claiborne Billings.    Any Other Special Instructions Will Be Listed Below (If Applicable).     If you need a refill on your cardiac medications before your next appointment, please call your pharmacy.

## 2017-07-19 ENCOUNTER — Encounter: Payer: Self-pay | Admitting: Cardiovascular Disease

## 2017-10-24 DIAGNOSIS — I440 Atrioventricular block, first degree: Secondary | ICD-10-CM | POA: Diagnosis not present

## 2017-10-24 DIAGNOSIS — E785 Hyperlipidemia, unspecified: Secondary | ICD-10-CM | POA: Diagnosis not present

## 2017-10-24 DIAGNOSIS — Z79899 Other long term (current) drug therapy: Secondary | ICD-10-CM | POA: Diagnosis not present

## 2017-10-24 DIAGNOSIS — I10 Essential (primary) hypertension: Secondary | ICD-10-CM | POA: Diagnosis not present

## 2017-10-25 LAB — COMPREHENSIVE METABOLIC PANEL
ALBUMIN: 4.5 g/dL (ref 3.6–4.8)
ALK PHOS: 91 IU/L (ref 39–117)
ALT: 28 IU/L (ref 0–44)
AST: 28 IU/L (ref 0–40)
Albumin/Globulin Ratio: 2 (ref 1.2–2.2)
BUN / CREAT RATIO: 17 (ref 10–24)
BUN: 19 mg/dL (ref 8–27)
Bilirubin Total: 0.5 mg/dL (ref 0.0–1.2)
CHLORIDE: 102 mmol/L (ref 96–106)
CO2: 23 mmol/L (ref 20–29)
Calcium: 9.6 mg/dL (ref 8.6–10.2)
Creatinine, Ser: 1.09 mg/dL (ref 0.76–1.27)
GFR calc Af Amer: 81 mL/min/{1.73_m2} (ref >59)
GFR calc non Af Amer: 70 mL/min/{1.73_m2} (ref >59)
GLOBULIN, TOTAL: 2.3 g/dL (ref 1.5–4.5)
GLUCOSE: 107 mg/dL — AB (ref 65–99)
Potassium: 4.2 mmol/L (ref 3.5–5.2)
SODIUM: 141 mmol/L (ref 134–144)
Total Protein: 6.8 g/dL (ref 6.0–8.5)

## 2017-10-25 LAB — CBC
Hematocrit: 44.5 % (ref 37.5–51.0)
Hemoglobin: 15.3 g/dL (ref 13.0–17.7)
MCH: 30.8 pg (ref 26.6–33.0)
MCHC: 34.4 g/dL (ref 31.5–35.7)
MCV: 90 fL (ref 79–97)
PLATELETS: 204 10*3/uL (ref 150–379)
RBC: 4.97 x10E6/uL (ref 4.14–5.80)
RDW: 13.4 % (ref 12.3–15.4)
WBC: 6.3 10*3/uL (ref 3.4–10.8)

## 2017-10-25 LAB — TSH: TSH: 2.1 u[IU]/mL (ref 0.450–4.500)

## 2017-10-25 LAB — LIPID PANEL
Chol/HDL Ratio: 3.1 ratio (ref 0.0–5.0)
Cholesterol, Total: 150 mg/dL (ref 100–199)
HDL: 48 mg/dL (ref >39)
LDL Calculated: 89 mg/dL (ref 0–99)
TRIGLYCERIDES: 63 mg/dL (ref 0–149)
VLDL Cholesterol Cal: 13 mg/dL (ref 5–40)

## 2017-11-10 DIAGNOSIS — M545 Low back pain: Secondary | ICD-10-CM | POA: Diagnosis not present

## 2017-11-10 DIAGNOSIS — R319 Hematuria, unspecified: Secondary | ICD-10-CM | POA: Diagnosis not present

## 2017-11-13 ENCOUNTER — Encounter: Payer: Self-pay | Admitting: *Deleted

## 2018-01-02 ENCOUNTER — Ambulatory Visit: Payer: PPO | Admitting: Cardiovascular Disease

## 2018-01-02 ENCOUNTER — Encounter: Payer: Self-pay | Admitting: Cardiovascular Disease

## 2018-01-02 VITALS — BP 114/84 | HR 62 | Ht 74.5 in | Wt 215.6 lb

## 2018-01-02 DIAGNOSIS — I440 Atrioventricular block, first degree: Secondary | ICD-10-CM | POA: Diagnosis not present

## 2018-01-02 DIAGNOSIS — E785 Hyperlipidemia, unspecified: Secondary | ICD-10-CM

## 2018-01-02 DIAGNOSIS — I10 Essential (primary) hypertension: Secondary | ICD-10-CM

## 2018-01-02 DIAGNOSIS — R42 Dizziness and giddiness: Secondary | ICD-10-CM | POA: Diagnosis not present

## 2018-01-02 MED ORDER — EZETIMIBE 10 MG PO TABS: 10.0000 mg | ORAL_TABLET | Freq: Every day | ORAL | 3 refills | Status: DC

## 2018-01-02 MED ORDER — METOPROLOL SUCCINATE ER 25 MG PO TB24: 12.5000 mg | ORAL_TABLET | Freq: Every day | ORAL | 3 refills | Status: DC

## 2018-01-02 NOTE — Patient Instructions (Signed)
Medication Instructions:  START Zetia 10 mg daily REDUCE metoprolol succinate (Toprol XL) to 12.5 mg daily  Follow-Up: Your physician wants you to follow-up in: 9 months with Dr. Claiborne Billings. You will receive a reminder letter in the mail two months in advance. If you don't receive a letter, please call our office to schedule the follow-up appointment.   Any Other Special Instructions Will Be Listed Below (If Applicable).     If you need a refill on your cardiac medications before your next appointment, please call your pharmacy.

## 2018-01-02 NOTE — Progress Notes (Signed)
Patient ID: Clarence Ware, male   DOB: 12-24-1950, 67 y.o.   MRN: [id number]    Primary M.D.: Dr. Ivar Bury  HPI: Clarence Ware is a 67 y.o. male presents to the office today for a 5 month followup cardiology evaluation.  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  Recently, another brother has a chronic aortic dissection for which she's been treated medically.  Clarence Ware has a history of hyperlipidemia and hypertension.  Over 10 years ago he had a normal routine treadmill test. The patient has continued to exercise regularly. There is a prior history of substance abuse for which he did undergo treatment.  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  He  has a history of hyperlipidemia, erectile dysfunction, a history of intermittent headaches.  When I saw him on 11/07/2012, we discussed the sensitivity and specificity of routine treadmill testing with a strong family history I elected to schedule him for a cardiopulmonary met test for further evaluation would also be helpful to assess endothelial dysfunction/diastolic dysfunction evaluate potential for microvascular angina. He also had a soft cardiac murmur at his apex and recommended a 2-D echo Doppler study.  His cardiopulmonary met test revealed that he had good functional capacity with an excellent maximum oxygen consumption peak of 92% of predicted. However, he had a abnormal response during the last several minutes of exercise suggesting ischemic myocardial dysfunction with decreasing stroke volume with increasing work rate which corresponded to an abrupt decrease in cardiac output suggestive of exercise-induced myocardial dysfunction. He did have very mild ventilation/perfusion mismatch  with reference to his pulmonary status.  A 2-D echo Doppler study showed normal systolic function with an ejection fraction of 55-60%. He had normal diastolic parameters.  When I saw him in followup of his noninvasive studies in June 2014 due to the mild abnormality noted during the last 7 minutes of exercise I recommended the addition of ARB therapy and further titrated his atorvastatin for aggressive lipid management. He has tolerated initiation of losartan.  I last saw him on most 2 years ago.  He denied any  chest pain or palpitations. He He did have a followup NMR lipoprotein of which showed an LDL particle number of 1216 with a calculated LDL of 86, HDL 46, triglycerides 58, total cholesterol 144. He had 552 small LDL some particles. HDL particle number was reduced at 29.1. Insulin resistance score was normal at 33.  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  However, he is still walking at least 3 days per week for a minimum of 30 minutes.  He underwent a sleep study at [facility] sleep at [facility] on 05/03/201 and was not found to have significant obstructive sleep apnea and his AHI and RDI were both 2.9.  He has significant periodic limb movements of sleep and had snoring.  He tells me he had a mouth piece customized by Dr. Oneal Grout for treatment of his snoring.   Last saw him in January 2019.  He denies any episodes of chest pain or shortness of breath.  He is unaware of palpitations.  He uses his  mouthguard to sleep and denies any awareness of breakthrough snoring.  He continues to be on losartan 25 mg daily, and metoprolol succinate 25 mg daily for hypertension.  He has continued to be on atorvastatin 20 mg.  Of note, laboratory 6 months ago showed an LDL cholesterol, which had risen up to 90.  Diet evaluation I further titrated atorvastatin to 40 mg.  Over the last 6 months he has continued to  remain stable.  He underwent repeat laboratory on his increased atorvastatin which had not significantly changed.  Total cholesterol was 150, triglycerides 63, HDL 48, LDL 89.  He denies chest pain PND orthopnea.  He denies shortness of breath.  He has noticed some rare dizziness particularly when he bends over.  He presents for reevaluation.  Past Medical History:  Diagnosis Date  . Allergy   . Hyperlipidemia   . Hypertension   . Substance abuse [facility])     Past Surgical History:  Procedure Laterality Date  . INGUINAL HERNIA REPAIR Right 03/10/2015   Procedure: OPEN RIGHT INGUINAL HERNIA REPAIR;  Surgeon: Johnathan Hausen, MD;  Location: [facility];  Service: General;  Laterality: Right;  With MESH  . VASECTOMY      Allergies  Allergen Reactions  . Nexium [Esomeprazole Magnesium]     Awful regurgitation    Current Outpatient Medications  Medication Sig Dispense Refill  . aspirin EC 81 MG tablet Take 81 mg by mouth daily.    Marland Kitchen atorvastatin (LIPITOR) 40 MG tablet Take 1 tablet (40 mg total) by mouth daily. 90 tablet 3  . cetirizine (ZYRTEC) 10 MG tablet Take 10 mg by mouth as directed.     . Cholecalciferol (VITAMIN D) 2000 units CAPS Take 1 capsule by mouth daily.    Marland Kitchen ibuprofen (ADVIL) 200 MG tablet Take 1 tablet (200 mg total) by mouth every 6 (six) hours as needed. 100 tablet 3  . losartan (COZAAR) 25 MG tablet Take 1 tablet (25 mg total) by mouth daily. 90 tablet 3  . metoprolol succinate (TOPROL-XL) 25 MG 24 hr tablet Take 0.5 tablets (12.5 mg total) by mouth daily. 45 tablet 3  . tadalafil (CIALIS) 20 MG tablet Use as directed. 6 tablet 11  . ezetimibe (ZETIA) 10 MG tablet Take 1 tablet (10 mg total) by mouth daily. 90 tablet 3   No current facility-administered medications for this visit.     Socially he is married has 2 children no grandchildren. He does exercise at least 30 minutes at a time. There is a prior history of tobacco use but he quit over 3 years ago and a prior use of  opiates. 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  ROS General: Negative; No fevers, chills, or night sweats;  HEENT: Negative; No changes in vision or hearing, sinus congestion, difficulty swallowing Pulmonary: Negative; No cough, wheezing, shortness of breath, hemoptysis Cardiovascular: See history of present illness GI: Negative; No nausea, vomiting, diarrhea, or abdominal pain GU: Negative; No dysuria, hematuria, or difficulty voiding Musculoskeletal: Negative; no myalgias, joint pain, or weakness Hematologic/Oncology: Negative; no easy bruising, bleeding Endocrine: Negative; no heat/cold intolerance; no diabetes Neuro: Negative; no changes in balance, headaches Skin: Negative; No rashes or skin lesions Psychiatric: Negative; No behavioral problems, depression Sleep: Sleep study negative for OSA but suspicious for increased upper airway resistance.  He did have periodic limb movements that were frequent.  No hypnogognic hallucinations, no cataplexy Other comprehensive  14 point system review is negative.  PE BP 114/84   Pulse 62   Ht 6' 2.5" (1.892 m)   Wt 215 lb 9.6 oz (97.8 kg)   BMI 27.31 kg/m    Repeat blood pressure was 112/80 supine and 106/78 standing  Wt Readings from Last 3 Encounters:  01/02/18 215 lb 9.6 oz (97.8 kg)  07/18/17 225 lb 9.6 oz (102.3 kg)  01/18/17 222 lb (100.7 kg)   General: Alert, oriented, no distress.  Skin: normal turgor, no rashes, warm and dry HEENT: Normocephalic, atraumatic. Pupils equal round and reactive to light; sclera anicteric; extraocular muscles intact;  Nose without nasal septal hypertrophy Mouth/Parynx benign; Mallinpatti scale 3 Neck: No JVD, no carotid bruits; normal carotid upstroke Lungs: clear to ausculatation and percussion; no wheezing or rales Chest wall: without tenderness to palpitation Heart: PMI not displaced, RRR, s1 s2 normal, 1/6  systolic murmur, no diastolic murmur, no rubs, gallops, thrills, or heaves Abdomen: soft, nontender; no hepatosplenomehaly, BS+; abdominal aorta nontender and not dilated by palpation. Back: no CVA tenderness Pulses 2+ Musculoskeletal: full range of motion, normal strength, no joint deformities Extremities: no clubbing cyanosis or edema, Homan's sign negative  Neurologic: grossly nonfocal; Cranial nerves grossly wnl Psychologic: Normal mood and affect  ECG (independently read by me): Normal sinus rhythm at 62 bpm.  First-degree AV block with a PR interval at 240 ms.  No ectopy.  Normal intervals.   July 18, 2017 ECG (independently read by me): Normal sinus rhythm with an isolated PVC.  PR interval 202 ms.  Heart rate 63 bpm.  October 2017 ECG (independently read by me): Sinus bradycardia 57 bpm.  First-degree AV block with a PR interval at 228 ms.  No ST segment changes.  June 2016 ECG (independently read by me): Sinus rhythm with first-degree AV block.  PR interval 264 ms.  QTc interval normal 381 ms.  September 2014 ECG: Sinus bradycardia for the first degree AV block. PR interval  220 ms. QT interval 393 ms.  LABS: BMP Latest Ref Rng & Units 10/24/2017 01/18/2017 01/13/2016  Glucose 65 - 99 mg/dL 107(H) 110(H) 104(H)  BUN 8 - 27 mg/dL _0 Creatinine 0.76 - 1.27 mg/dL 1.09 1.00 1.06  BUN/Creat Ratio 10 - 24 17 - -  Sodium 134 - 144 mmol/L 141 139 137  Potassium 3.5 - 5.2 mmol/L 4.2 4.7 4.2  Chloride 96 - 106 mmol/L 102 106 105  CO2 20 - 29 mmol/L _1 Calcium 8.6 - 10.2 mg/dL 9.6 9.8 9.0   Hepatic Function Latest Ref Rng & Units 10/24/2017 01/18/2017 01/13/2016  Total Protein 6.0 - 8.5 g/dL 6.8 6.3 6.2  Albumin 3.6 - 4.8 g/dL 4.5 4.1 4.2  AST 0 - 40 IU/L _2 ALT 0 - 44 IU/L _3 Alk Phosphatase 39 - 117 IU/L 91 74 67  Total Bilirubin 0.0 - 1.2 mg/dL 0.5 0.7 0.6  Bilirubin, Direct <=0.2 mg/dL - - -   CBC Latest Ref Rng & Units 10/24/2017 01/18/2017 01/13/2016    WBC 3.4 - 10.8 x10E3/uL 6.3 5.9 6.1  Hemoglobin 13.0 - 17.7 g/dL 15.3 14.4 15.0  Hematocrit 37.5 - 51.0 % 44.5 41.9 42.2(A)  Platelets 150 - 379 x10E3/uL 204 200.0 -   Lab Results  Component Value Date   MCV 90 10/24/2017   MCV 89.4 01/18/2017   MCV 87.4 01/13/2016   Lab Results  Component Value Date   TSH 2.100 10/24/2017  Lab Results  Component Value Date   HGBA1C 5.9 01/13/2016     Lipid Panel     Component Value Date/Time   CHOL 150 10/24/2017 0809   CHOL 144 03/03/2013 0855   TRIG 63 10/24/2017 0809   TRIG 58 03/03/2013 0855   HDL 48 10/24/2017 0809   HDL 46 03/03/2013 0855   CHOLHDL 3.1 10/24/2017 0809   CHOLHDL 3 01/18/2017 0932   VLDL 13.6 01/18/2017 0932   LDLCALC 89 10/24/2017 0809   LDLCALC 86 03/03/2013 0855     RADIOLOGY: No results found.  IMPRESSION:  1. Essential hypertension, benign   2. Essential hypertension   3. Hyperlipidemia with target LDL less than 70   4. First degree AV block   5. 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  He has  a significant family history for cardiovascular disease with a father who died at age 51 following an MI, and over brother who underwent CABG revascularization surgery, and recently another brother was diagnosed with an aortic dissection.  He has had first-degree block.  ECG today remains normal with sinus rhythm at 62.  PR interval is now 240 ms.  He denies any episodes of chest pain.  He has noticed some vague episodes of some mild dizziness particularly if he bends over.  On exam today his blood pressure is normal but on the low side and when going from supine to standing his blood pressure decreased to 106/78.  He has been on losartan at 25 mg and Toprol-XL 25 mg daily.  I have recommending he reduce his Toprol-XL to 12.5 mg which may improve some of his wear dizziness symptomatology and  further reduce his first-degree AV block.  On his increased atorvastatin dose at 40 mg there was no major change with his LDL going from 90-89.  As result, with a strong family history for premature CAD aim for target LDL less than 70 I am adding Zetia 10 mg to his medical regimen.  This should provide an additional 20% reduction with combination statin therapy.  He tells me he will be seeing his primary care physician in 6 months and laboratory will be obtained.  Not been exercising as regularly as he should.  I again discussed current AHA guideline recommendations of exercising at least 5 days a week for a minimum of 30 minutes of moderate intensity.  He continues to use a customized oral appliance by Dr. Oneal Grout and denies any residual snoring.  He denies daytime sleepiness.  I will see him in 9 months for reevaluation.     Time spent: 25 minutes Troy Sine, MD, [facility]  01/02/2018 9:51 AM

## 2018-01-03 NOTE — Addendum Note (Signed)
Addended by: Zebedee Iba on: 01/03/2018 04:43 PM   Modules accepted: Orders

## 2018-01-24 NOTE — Progress Notes (Signed)
Subjective:   Clarence Ware is a 67 y.o. male who presents for Medicare Annual/Subsequent preventive examination.  Review of Systems:  No ROS.  Medicare Wellness Visit. Additional risk factors are reflected in the social history.  Cardiac Risk Factors include: advanced age (>50men, >55 women);dyslipidemia;male gender;hypertension Sleep patterns: feels rested on waking, gets up 1 times nightly to void and sleeps 7-8 hours nightly.    Home Safety/Smoke Alarms: Feels safe in home. Smoke alarms in place.  Living environment; residence and Firearm Safety: 1-story house/ trailer, no firearms. Lives with wife, no needs for DME, good support system Seat Belt Safety/Bike Helmet: Wears seat belt.      Objective:    Vitals: BP (!) 142/84   Pulse 64   Resp 18   Ht 6\' 3"  (1.905 m)   Wt 213 lb (96.6 kg)   SpO2 98%   BMI 26.62 kg/m   Body mass index is 26.62 kg/m.  Advanced Directives 01/25/2018 01/19/2017 03/19/2015 03/10/2015 02/26/2015 12/14/2014 03/26/2013  Does Patient Have a Medical Advance Directive? Yes Yes Yes - Yes Yes Patient has advance directive, copy not in chart  Type of Advance Directive [place];Living will [place];Living will - - [place];Living will [place];Living will [place];Living will  Does patient want to make changes to medical advance directive? - - - - No - Patient declined No - Patient declined -  Copy of [place] in Chart? No - copy requested Yes - (No Data) No - copy requested No - copy requested -    Tobacco Social History   Tobacco Use  Smoking Status Former Smoker  . Packs/day: 0.25  . Years: 1.00  . Pack years: 0.25  . Last attempt to quit: 07/10/1992  . Years since quitting: 25.5  Smokeless Tobacco Never Used     Counseling given: Not Answered  Past Medical History:  Diagnosis Date  . Allergy   . Hyperlipidemia   . Hypertension    . Substance abuse [facility])    Past Surgical History:  Procedure Laterality Date  . INGUINAL HERNIA REPAIR Right 03/10/2015   Procedure: OPEN RIGHT INGUINAL HERNIA REPAIR;  Surgeon: Johnathan Hausen, MD;  Location: [facility];  Service: General;  Laterality: Right;  With MESH  . VASECTOMY     Family History  Problem Relation Age of Onset  . Heart disease Mother   . Stroke Mother   . Heart disease Father 16       MI  . Hypertension Father   . Hypertension Brother   . Diabetes Brother   . Heart disease Brother   . Hyperlipidemia Brother   . Cancer Brother   . Hyperlipidemia Sister   . Coronary artery disease Brother 32       CABG  . Colon cancer Paternal Grandmother    Social History   Socioeconomic History  . Marital status: Married    Spouse name: Not on file  . Number of children: 2  . Years of education: Not on file  . Highest education level: Not on file  Occupational History  . Occupation: Marine scientist: [employer]  . Financial resource strain: Not hard at all  . Food insecurity:    Worry: Never true    Inability: Never true  . Transportation needs:    Medical: No    Non-medical: No  Tobacco Use  . Smoking status: Former  Smoker    Packs/day: 0.25    Years: 1.00    Pack years: 0.25    Last attempt to quit: 07/10/1992    Years since quitting: 25.5  . Smokeless tobacco: Never Used  Substance and Sexual Activity  . Alcohol use: No    Alcohol/week: 0.0 oz  . Drug use: No  . Sexual activity: Yes  Lifestyle  . Physical activity:    Days per week: 3 days    Minutes per session: 40 min  . Stress: Not at all  Relationships  . Social connections:    Talks on phone: More than three times a week    Gets together: More than three times a week    Attends religious service: More than 4 times per year    Active member of club or organization: Yes    Attends meetings of clubs or organizations: More than 4 times per year    Relationship status:  Married  Other Topics Concern  . Not on file  Social History Narrative   Married. Education: [employer].   Exercise: some    Outpatient Encounter Medications as of 01/25/2018  Medication Sig  . aspirin EC 81 MG tablet Take 81 mg by mouth daily.  Marland Kitchen atorvastatin (LIPITOR) 40 MG tablet Take 1 tablet (40 mg total) by mouth daily.  . cetirizine (ZYRTEC) 10 MG tablet Take 10 mg by mouth as directed.   . Cholecalciferol (VITAMIN D) 2000 units CAPS Take 1 capsule by mouth daily.  Marland Kitchen ezetimibe (ZETIA) 10 MG tablet Take 1 tablet (10 mg total) by mouth daily.  Marland Kitchen ibuprofen (ADVIL) 200 MG tablet Take 1 tablet (200 mg total) by mouth every 6 (six) hours as needed.  Marland Kitchen losartan (COZAAR) 25 MG tablet Take 1 tablet (25 mg total) by mouth daily.  . metoprolol succinate (TOPROL-XL) 25 MG 24 hr tablet Take 0.5 tablets (12.5 mg total) by mouth daily.  . Multiple Vitamin (MULTI-VITAMIN PO) Take 1 tablet by mouth.  . tadalafil (CIALIS) 20 MG tablet Use as directed.   No facility-administered encounter medications on file as of 01/25/2018.     Activities of Daily Living In your present state of health, do you have any difficulty performing the following activities: 01/25/2018  Hearing? N  Vision? N  Difficulty concentrating or making decisions? N  Walking or climbing stairs? N  Dressing or bathing? N  Doing errands, shopping? N  Preparing Food and eating ? N  Using the Toilet? N  In the past six months, have you accidently leaked urine? N  Do you have problems with loss of bowel control? N  Managing your Medications? N  Managing your Finances? N  Housekeeping or managing your Housekeeping? N  Some recent data might be hidden    Patient Care Team: [name], MD as PCP - General (Internal Medicine) Troy Sine, MD as PCP - Cardiology (Cardiology) Franchot Gallo, MD as Consulting Physician (Urology) Katy Apo, MD as Consulting Physician (Ophthalmology) Irene Shipper, MD as Consulting  Physician (Gastroenterology)   Assessment:   This is a routine wellness examination for [name]. Physical assessment deferred to PCP.   Exercise Activities and Dietary recommendations Current Exercise Habits: Home exercise routine, Type of exercise: walking, Time (Minutes): 40, Frequency (Times/Week): 3, Weekly Exercise (Minutes/Week): 120, Intensity: Mild, Exercise limited by: None identified  Diet (meal preparation, eat out, water intake, caffeinated beverages, dairy products, fruits and vegetables): in general, a "healthy" diet  , well balanced, eats a variety  of fruits and vegetables daily, limits salt, fat/cholesterol, sugar,carbohydrates,caffeine, drinks 6-8 glasses of water daily.  Goals    . Patient Stated     Continue to eat healthy and more natural, increase my exercise to be routine, travel, enjoy life, family and continue to be active in the 12 step program.       Fall Risk Fall Risk  01/25/2018 01/19/2017 01/18/2017 01/13/2016 08/03/2015  Falls in the past year? No No Yes No No  Number falls in past yr: - - 1 - -  Injury with Fall? - - Yes - -  Follow up - - Falls prevention discussed - -   Depression Screen PHQ 2/9 Scores 01/25/2018 01/19/2017 01/18/2017 01/13/2016  PHQ - 2 Score 0 0 0 0  PHQ- 9 Score 0 - - -    Cognitive Function       Ad8 score reviewed for issues:  Issues making decisions: no  Less interest in hobbies / activities: no  Repeats questions, stories (family complaining): no  Trouble using ordinary gadgets (microwave, computer, phone):no  Forgets the month or year: no  Mismanaging finances: no  Remembering appts: no  Daily problems with thinking and/or memory: no Ad8 score is= 0  Immunization History  Administered Date(s) Administered  . Hepatitis B 06/09/2013  . Influenza-Unspecified 06/09/2013, 04/09/2014  . Pneumococcal Conjugate-13 01/13/2016  . Pneumococcal Polysaccharide-23 01/18/2017  . Tdap 10/03/2011  . Zoster 06/09/2013    Screening Tests Health Maintenance  Topic Date Due  . INFLUENZA VACCINE  02/07/2018  . TETANUS/TDAP  10/02/2021  . COLONOSCOPY  03/18/2025  . Hepatitis C Screening  Completed  . PNA vac Low Risk Adult  Completed      Plan:     Continue doing brain stimulating activities (puzzles, reading, adult coloring books, staying active) to keep memory sharp.   Continue to eat heart healthy diet (full of fruits, vegetables, whole grains, lean protein, water--limit salt, fat, and sugar intake) and increase physical activity as tolerated.   I have personally reviewed and noted the following in the patient's chart:   . Medical and social history . Use of alcohol, tobacco or illicit drugs  . Current medications and supplements . Functional ability and status . Nutritional status . Physical activity . Advanced directives . List of other physicians . Vitals . Screenings to include cognitive, depression, and falls . Referrals and appointments  In addition, I have reviewed and discussed with patient certain preventive protocols, quality metrics, and best practice recommendations. A written personalized care plan for preventive services as well as general preventive health recommendations were provided to patient.     Michiel Cowboy, RN  01/25/2018

## 2018-01-25 ENCOUNTER — Ambulatory Visit (INDEPENDENT_AMBULATORY_CARE_PROVIDER_SITE_OTHER): Payer: PPO | Admitting: *Deleted

## 2018-01-25 VITALS — BP 142/84 | HR 64 | Resp 18 | Ht 75.0 in | Wt 213.0 lb

## 2018-01-25 DIAGNOSIS — Z0000 Encounter for general adult medical examination without abnormal findings: Secondary | ICD-10-CM

## 2018-01-25 NOTE — Progress Notes (Signed)
Medical screening examination/treatment/procedure(s) were performed by non-physician practitioner and as supervising physician I was immediately available for consultation/collaboration. I agree with above. Lanier Felty A Karyl Sharrar, MD 

## 2018-01-25 NOTE — Patient Instructions (Addendum)
Continue doing brain stimulating activities (puzzles, reading, adult coloring books, staying active) to keep memory sharp.   Continue to eat heart healthy diet (full of fruits, vegetables, whole grains, lean protein, water--limit salt, fat, and sugar intake) and increase physical activity as tolerated.   Clarence Ware , Thank you for taking time to come for your Medicare Wellness Visit. I appreciate your ongoing commitment to your health goals. Please review the following plan we discussed and let me know if I can assist you in the future.   These are the goals we discussed: Goals    . Patient Stated     Continue to eat healthy and more natural, increase my exercise to be routine, travel, enjoy life, family and continue to be active in the 12 step program.       This is a list of the screening recommended for you and due dates:  Health Maintenance  Topic Date Due  . Flu Shot  02/07/2018  . Tetanus Vaccine  10/02/2021  . Colon Cancer Screening  03/18/2025  .  Hepatitis C: One time screening is recommended by Center for Disease Control  (CDC) for  adults born from [birth year] through 1966.   Completed  . Pneumonia vaccines  Completed     Health Maintenance, Male A healthy lifestyle and preventive care is important for your health and wellness. Ask your health care provider about what schedule of regular examinations is right for you. What should I know about weight and diet? Eat a Healthy Diet  Eat plenty of vegetables, fruits, whole grains, low-fat dairy products, and lean protein.  Do not eat a lot of foods high in solid fats, added sugars, or salt.  Maintain a Healthy Weight Regular exercise can help you achieve or maintain a healthy weight. You should:  Do at least 150 minutes of exercise each week. The exercise should increase your heart rate and make you sweat (moderate-intensity exercise).  Do strength-training exercises at least twice a week.  Watch Your Levels of Cholesterol  and Blood Lipids  Have your blood tested for lipids and cholesterol every 5 years starting at 68 years of age. If you are at high risk for heart disease, you should start having your blood tested when you are 68 years old. You may need to have your cholesterol levels checked more often if: ? Your lipid or cholesterol levels are high. ? You are older than 68 years of age. ? You are at high risk for heart disease.  What should I know about cancer screening? Many types of cancers can be detected early and may often be prevented. Lung Cancer  You should be screened every year for lung cancer if: ? You are a current smoker who has smoked for at least 30 years. ? You are a former smoker who has quit within the past 15 years.  Talk to your health care provider about your screening options, when you should start screening, and how often you should be screened.  Colorectal Cancer  Routine colorectal cancer screening usually begins at 68 years of age and should be repeated every 5-10 years until you are 68 years old. You may need to be screened more often if early forms of precancerous polyps or small growths are found. Your health care provider may recommend screening at an earlier age if you have risk factors for colon cancer.  Your health care provider may recommend using home test kits to check for hidden blood in the stool.  A small camera at the end of a tube can be used to examine your colon (sigmoidoscopy or colonoscopy). This checks for the earliest forms of colorectal cancer.  Prostate and Testicular Cancer  Depending on your age and overall health, your health care provider may do certain tests to screen for prostate and testicular cancer.  Talk to your health care provider about any symptoms or concerns you have about testicular or prostate cancer.  Skin Cancer  Check your skin from head to toe regularly.  Tell your health care provider about any new moles or changes in moles,  especially if: ? There is a change in a mole's size, shape, or color. ? You have a mole that is larger than a pencil eraser.  Always use sunscreen. Apply sunscreen liberally and repeat throughout the day.  Protect yourself by wearing long sleeves, pants, a wide-brimmed hat, and sunglasses when outside.  What should I know about heart disease, diabetes, and high blood pressure?  If you are 35-23 years of age, have your blood pressure checked every 3-5 years. If you are 31 years of age or older, have your blood pressure checked every year. You should have your blood pressure measured twice-once when you are at a hospital or clinic, and once when you are not at a hospital or clinic. Record the average of the two measurements. To check your blood pressure when you are not at a hospital or clinic, you can use: ? An automated blood pressure machine at a pharmacy. ? A home blood pressure monitor.  Talk to your health care provider about your target blood pressure.  If you are between 15-61 years old, ask your health care provider if you should take aspirin to prevent heart disease.  Have regular diabetes screenings by checking your fasting blood sugar level. ? If you are at a normal weight and have a low risk for diabetes, have this test once every three years after the age of 30. ? If you are overweight and have a high risk for diabetes, consider being tested at a younger age or more often.  A one-time screening for abdominal aortic aneurysm (AAA) by ultrasound is recommended for men aged 49-75 years who are current or former smokers. What should I know about preventing infection? Hepatitis B If you have a higher risk for hepatitis B, you should be screened for this virus. Talk with your health care provider to find out if you are at risk for hepatitis B infection. Hepatitis C Blood testing is recommended for:  Everyone born from [birth year] through 1966.  Anyone with known risk factors for  hepatitis C.  Sexually Transmitted Diseases (STDs)  You should be screened each year for STDs including gonorrhea and chlamydia if: ? You are sexually active and are younger than 68 years of age. ? You are older than 68 years of age and your health care provider tells you that you are at risk for this type of infection. ? Your sexual activity has changed since you were last screened and you are at an increased risk for chlamydia or gonorrhea. Ask your health care provider if you are at risk.  Talk with your health care provider about whether you are at high risk of being infected with HIV. Your health care provider may recommend a prescription medicine to help prevent HIV infection.  What else can I do?  Schedule regular health, dental, and eye exams.  Stay current with your vaccines (immunizations).  Do not use  any tobacco products, such as cigarettes, chewing tobacco, and e-cigarettes. If you need help quitting, ask your health care provider.  Limit alcohol intake to no more than 2 drinks per day. One drink equals 12 ounces of beer, 5 ounces of [place], or 1 ounces of hard liquor.  Do not use street drugs.  Do not share needles.  Ask your health care provider for help if you need support or information about quitting drugs.  Tell your health care provider if you often feel depressed.  Tell your health care provider if you have ever been abused or do not feel safe at home. This information is not intended to replace advice given to you by your health care provider. Make sure you discuss any questions you have with your health care provider. Document Released: 12/23/2007 Document Revised: 02/23/2016 Document Reviewed: 03/30/2015 Elsevier Interactive Patient Education  [employer].

## 2018-02-25 ENCOUNTER — Other Ambulatory Visit: Payer: Self-pay | Admitting: Internal Medicine

## 2018-02-25 DIAGNOSIS — I10 Essential (primary) hypertension: Secondary | ICD-10-CM

## 2018-07-22 ENCOUNTER — Encounter: Payer: Self-pay | Admitting: Internal Medicine

## 2018-07-22 ENCOUNTER — Other Ambulatory Visit (INDEPENDENT_AMBULATORY_CARE_PROVIDER_SITE_OTHER): Payer: PPO

## 2018-07-22 ENCOUNTER — Ambulatory Visit (INDEPENDENT_AMBULATORY_CARE_PROVIDER_SITE_OTHER): Payer: PPO | Admitting: Internal Medicine

## 2018-07-22 VITALS — BP 132/80 | HR 57 | Temp 98.7°F | Resp 16 | Ht 75.0 in | Wt 216.0 lb

## 2018-07-22 DIAGNOSIS — N400 Enlarged prostate without lower urinary tract symptoms: Secondary | ICD-10-CM

## 2018-07-22 DIAGNOSIS — R7301 Impaired fasting glucose: Secondary | ICD-10-CM

## 2018-07-22 DIAGNOSIS — E785 Hyperlipidemia, unspecified: Secondary | ICD-10-CM

## 2018-07-22 DIAGNOSIS — K219 Gastro-esophageal reflux disease without esophagitis: Secondary | ICD-10-CM

## 2018-07-22 DIAGNOSIS — I10 Essential (primary) hypertension: Secondary | ICD-10-CM

## 2018-07-22 LAB — COMPREHENSIVE METABOLIC PANEL
ALT: 34 U/L (ref 0–53)
AST: 26 U/L (ref 0–37)
Albumin: 4.3 g/dL (ref 3.5–5.2)
Alkaline Phosphatase: 77 U/L (ref 39–117)
BUN: 20 mg/dL (ref 6–23)
CO2: 30 mEq/L (ref 19–32)
Calcium: 10.1 mg/dL (ref 8.4–10.5)
Chloride: 103 mEq/L (ref 96–112)
Creatinine, Ser: 1.14 mg/dL (ref 0.40–1.50)
GFR: 67.89 mL/min (ref >60.00)
Glucose, Bld: 105 mg/dL — ABNORMAL HIGH (ref 70–99)
POTASSIUM: 4.7 meq/L (ref 3.5–5.1)
Sodium: 138 mEq/L (ref 135–145)
TOTAL PROTEIN: 6.8 g/dL (ref 6.0–8.3)
Total Bilirubin: 0.9 mg/dL (ref 0.2–1.2)

## 2018-07-22 LAB — URINALYSIS, ROUTINE W REFLEX MICROSCOPIC
Bilirubin Urine: NEGATIVE
HGB URINE DIPSTICK: NEGATIVE
KETONES UR: NEGATIVE
Leukocytes, UA: NEGATIVE
NITRITE: NEGATIVE
RBC / HPF: NONE SEEN (ref 0–?)
SPECIFIC GRAVITY, URINE: 1.01 (ref 1.000–1.030)
Total Protein, Urine: NEGATIVE
Urine Glucose: NEGATIVE
Urobilinogen, UA: 0.2 (ref 0.0–1.0)
WBC UA: NONE SEEN (ref 0–?)
pH: 7.5 (ref 5.0–8.0)

## 2018-07-22 LAB — TSH: TSH: 1.87 u[IU]/mL (ref 0.35–4.50)

## 2018-07-22 LAB — CBC WITH DIFFERENTIAL/PLATELET
Basophils Absolute: 0 10*3/uL (ref 0.0–0.1)
Basophils Relative: 0.4 % (ref 0.0–3.0)
Eosinophils Absolute: 0.3 10*3/uL (ref 0.0–0.7)
Eosinophils Relative: 5.2 % — ABNORMAL HIGH (ref 0.0–5.0)
HCT: 43.3 % (ref 39.0–52.0)
Hemoglobin: 14.7 g/dL (ref 13.0–17.0)
LYMPHS ABS: 1.3 10*3/uL (ref 0.7–4.0)
Lymphocytes Relative: 21.3 % (ref 12.0–46.0)
MCHC: 34 g/dL (ref 30.0–36.0)
MCV: 90 fl (ref 78.0–100.0)
Monocytes Absolute: 0.7 10*3/uL (ref 0.1–1.0)
Monocytes Relative: 10.8 % (ref 3.0–12.0)
NEUTROS PCT: 62.3 % (ref 43.0–77.0)
Neutro Abs: 3.9 10*3/uL (ref 1.4–7.7)
Platelets: 209 10*3/uL (ref 150.0–400.0)
RBC: 4.81 Mil/uL (ref 4.22–5.81)
RDW: 13.2 % (ref 11.5–15.5)
WBC: 6.3 10*3/uL (ref 4.0–10.5)

## 2018-07-22 LAB — LIPID PANEL
Cholesterol: 124 mg/dL (ref 0–200)
HDL: 41.4 mg/dL (ref >39.00)
LDL Cholesterol: 70 mg/dL (ref 0–99)
NonHDL: 82.87
Total CHOL/HDL Ratio: 3
Triglycerides: 66 mg/dL (ref 0.0–149.0)
VLDL: 13.2 mg/dL (ref 0.0–40.0)

## 2018-07-22 LAB — PSA: PSA: 1.88 ng/mL (ref 0.10–4.00)

## 2018-07-22 LAB — HEMOGLOBIN A1C: Hgb A1c MFr Bld: 6.2 % (ref 4.6–6.5)

## 2018-07-22 NOTE — Patient Instructions (Signed)

## 2018-07-22 NOTE — Progress Notes (Signed)
Subjective:  Patient ID: Clarence Ware, male    DOB: 1951/04/13  Age: 68 y.o. MRN: [id number]  CC: Gastroesophageal Reflux; Hypertension; and Hyperlipidemia   HPI Hal Norrington presents for f/up - He feels well today and offers no complaints.  He is very active and denies any recent episodes of CP, DOE, palpitations, edema, fatigue, lightheadedness, or dizziness.  Outpatient Medications Prior to Visit  Medication Sig Dispense Refill  . aspirin EC 81 MG tablet Take 81 mg by mouth daily.    Marland Kitchen atorvastatin (LIPITOR) 40 MG tablet Take 1 tablet (40 mg total) by mouth daily. 90 tablet 3  . cetirizine (ZYRTEC) 10 MG tablet Take 10 mg by mouth as directed.     . Cholecalciferol (VITAMIN D) 2000 units CAPS Take 1 capsule by mouth daily.    Marland Kitchen losartan (COZAAR) 25 MG tablet TAKE ONE TABLET BY MOUTH DAILY 90 tablet 1  . metoprolol succinate (TOPROL-XL) 25 MG 24 hr tablet Take 0.5 tablets (12.5 mg total) by mouth daily. 45 tablet 3  . Multiple Vitamin (MULTI-VITAMIN PO) Take 1 tablet by mouth.    . tadalafil (CIALIS) 20 MG tablet Use as directed. 6 tablet 11  . ibuprofen (ADVIL) 200 MG tablet Take 1 tablet (200 mg total) by mouth every 6 (six) hours as needed. 100 tablet 3  . ezetimibe (ZETIA) 10 MG tablet Take 1 tablet (10 mg total) by mouth daily. 90 tablet 3   No facility-administered medications prior to visit.     ROS Review of Systems  Constitutional: Negative for appetite change, diaphoresis, fatigue and unexpected weight change.  HENT: Negative.  Negative for sore throat, trouble swallowing and voice change.   Eyes: Negative for visual disturbance.  Respiratory: Negative for cough, chest tightness, shortness of breath and wheezing.   Cardiovascular: Negative for chest pain, palpitations and leg swelling.  Gastrointestinal: Negative for abdominal pain, blood in stool, constipation, diarrhea, nausea and vomiting.  Genitourinary: Negative.  Negative for difficulty urinating.    Musculoskeletal: Negative.  Negative for arthralgias and myalgias.  Skin: Negative.  Negative for color change.  Neurological: Negative.  Negative for dizziness, syncope, weakness and light-headedness.  Hematological: Negative for adenopathy. Does not bruise/bleed easily.  Psychiatric/Behavioral: Negative.     Objective:  BP 132/80 (BP Location: Left Arm, Patient Position: Sitting, Cuff Size: Normal)   Pulse (!) 57   Temp 98.7 F (37.1 C) (Oral)   Resp 16   Ht 6\' 3"  (1.905 m)   Wt 216 lb (98 kg)   SpO2 96%   BMI 27.00 kg/m   BP Readings from Last 3 Encounters:  07/22/18 132/80  01/25/18 (!) 142/84  01/02/18 114/84    Wt Readings from Last 3 Encounters:  07/22/18 216 lb (98 kg)  01/25/18 213 lb (96.6 kg)  01/02/18 215 lb 9.6 oz (97.8 kg)    Physical Exam Vitals signs reviewed.  Constitutional:      Appearance: He is not ill-appearing.  HENT:     Mouth/Throat:     Mouth: Mucous membranes are moist.     Pharynx: No oropharyngeal exudate or posterior oropharyngeal erythema.  Eyes:     General: No scleral icterus.    Conjunctiva/sclera: Conjunctivae normal.  Neck:     Musculoskeletal: Normal range of motion and neck supple. No muscular tenderness.  Cardiovascular:     Rate and Rhythm: Normal rate and regular rhythm.     Heart sounds: No murmur. No gallop.   Pulmonary:     Effort:  Pulmonary effort is normal.     Breath sounds: No stridor. No wheezing or rhonchi.  Abdominal:     General: Bowel sounds are normal.     Palpations: There is no mass.     Tenderness: There is no abdominal tenderness. There is no guarding.  Musculoskeletal: Normal range of motion.        General: No swelling.     Right lower leg: No edema.     Left lower leg: No edema.  Skin:    General: Skin is warm.     Coloration: Skin is not pale.     Findings: No rash.  Neurological:     General: No focal deficit present.     Mental Status: He is alert and oriented to person, place, and time.  Mental status is at baseline.  Psychiatric:        Mood and Affect: Mood normal.        Behavior: Behavior normal.        Thought Content: Thought content normal.        Judgment: Judgment normal.     Lab Results  Component Value Date   WBC 6.3 07/22/2018   HGB 14.7 07/22/2018   HCT 43.3 07/22/2018   PLT 209.0 07/22/2018   GLUCOSE 105 (H) 07/22/2018   CHOL 124 07/22/2018   TRIG 66.0 07/22/2018   HDL 41.40 07/22/2018   LDLCALC 70 07/22/2018   ALT 34 07/22/2018   AST 26 07/22/2018   NA 138 07/22/2018   K 4.7 07/22/2018   CL 103 07/22/2018   CREATININE 1.14 07/22/2018   BUN 20 07/22/2018   CO2 30 07/22/2018   TSH 1.87 07/22/2018   PSA 1.88 07/22/2018   HGBA1C 6.2 07/22/2018    No results found.  Assessment & Plan:   Sahand was seen today for gastroesophageal reflux, hypertension and hyperlipidemia.  Diagnoses and all orders for this visit:  Impaired fasting glucose- His A1c is up to 6.2%.  He is prediabetic.  He was encouraged to improve his lifestyle modifications with diet/exercise/weight loss. -     Comprehensive metabolic panel; Future -     Hemoglobin A1c; Future  Hyperlipidemia with target LDL less than 100- He has achieved his LDL goal and is doing well on the statin. -     Lipid panel; Future -     TSH; Future  Gastroesophageal reflux disease without esophagitis- He is asymptomatic with this.  Treatment is not indicated. -     CBC with Differential/Platelet; Future  Essential hypertension- His blood pressure is adequately well controlled.  Electrolytes and renal function are normal. -     Comprehensive metabolic panel; Future -     TSH; Future  Benign prostatic hyperplasia without lower urinary tract symptoms- His PSA remains normal which is reassuring that he does not have prostate cancer.  He has no symptoms that need to be treated.  He prefers to have his annual prostate exam done by a urologist. -     PSA; Future -     Urinalysis, Routine w reflex  microscopic; Future -     Ambulatory referral to Urology   I have discontinued Bradyn Peralta's ibuprofen. I am also having him maintain his tadalafil, cetirizine, Vitamin D, aspirin EC, atorvastatin, metoprolol succinate, ezetimibe, Multiple Vitamin (MULTI-VITAMIN PO), and losartan.  No orders of the defined types were placed in this encounter.    Follow-up: Return in about 6 months (around 01/20/2019).  [name], MD

## 2018-08-18 ENCOUNTER — Other Ambulatory Visit: Payer: Self-pay | Admitting: Internal Medicine

## 2018-08-18 DIAGNOSIS — I10 Essential (primary) hypertension: Secondary | ICD-10-CM

## 2018-08-26 ENCOUNTER — Other Ambulatory Visit: Payer: Self-pay | Admitting: Cardiovascular Disease

## 2018-08-26 DIAGNOSIS — E785 Hyperlipidemia, unspecified: Secondary | ICD-10-CM

## 2018-09-26 ENCOUNTER — Telehealth: Payer: Self-pay | Admitting: Cardiovascular Disease

## 2018-09-26 NOTE — Telephone Encounter (Signed)
   Cardiac Questionnaire:    Since your last visit or hospitalization:    1. Have you been having new or worsening chest pain? No   2. Have you been having new or worsening shortness of breath? No 3. Have you been having new or worsening leg swelling, wt gain, or increase in abdominal girth (pants fitting more tightly)? No   4. Have you had any passing out spells? No; but pt states he has been feeling lightheaded when changing from sitting to standing position.    *A YES to any of these questions would result in the appointment being kept. *If all the answers to these questions are NO, we should indicate that given the current situation regarding the worldwide coronarvirus pandemic, at the recommendation of the CDC, we are looking to limit gatherings in our waiting area, and thus will reschedule their appointment beyond four weeks from today.   _____________   Damaris Schooner with patient. He stated he has been feeling lightheaded when changing from sitting to standing position. Patient has not been recording BP; advised pt to take BP for a week once daily and also when feeling lightheaded and call with those recordings. Pt stated he will order a BP cuff from [facility].Told pt he will speak with a triage nurse who will document information and discuss with Dr. Claiborne Billings. Pt verbalized understanding. Told pt that upcoming appointment will be cancelled for now and rescheduled when schedules open back up; or if appointment needed for medication adjustment after recordings received. Pt verbalized understanding and thanks for the call.   Pt needs f/u beyond 6 weeks.

## 2018-09-30 ENCOUNTER — Ambulatory Visit: Payer: PPO | Admitting: Cardiovascular Disease

## 2018-10-01 ENCOUNTER — Other Ambulatory Visit: Payer: Self-pay | Admitting: Internal Medicine

## 2018-10-01 DIAGNOSIS — M654 Radial styloid tenosynovitis [de Quervain]: Secondary | ICD-10-CM

## 2018-10-06 NOTE — Progress Notes (Signed)
[facility] [place], [place] [address] Phone: [phone] Subjective:   [name], am serving as a scribe for Dr. Hulan Saas.  I'm seeing this patient by the request  of:  [name], MD   CC: Right wrist pain  NGE:[id number]  Clarence Ware is a 68 y.o. male coming in with complaint of right wrist pain. Has been having pain for months just below thumb. Pain with pronation and supination. Has used IBU to treat pain. Is able to grip with good strength.  Patient states noticed with certain range of motion is a severe 8 out of 10 pain that can occur in the wrist.  Patient describes it as a dull, throbbing aching sensation.      Past Medical History:  Diagnosis Date   Allergy    Hyperlipidemia    Hypertension    Substance abuse [facility])    Past Surgical History:  Procedure Laterality Date   INGUINAL HERNIA REPAIR Right 03/10/2015   Procedure: OPEN RIGHT INGUINAL HERNIA REPAIR;  Surgeon: Johnathan Hausen, MD;  Location: [facility];  Service: General;  Laterality: Right;  With MESH   VASECTOMY     Social History   Socioeconomic History   Marital status: Married    Spouse name: Not on file   Number of children: 2   Years of education: Not on file   Highest education level: Not on file  Occupational History   Occupation: Marine scientist: [employer]   Financial resource strain: Not hard at all   Food insecurity:    Worry: Never true    Inability: Never true   Transportation needs:    Medical: No    Non-medical: No  Tobacco Use   Smoking status: Former Smoker    Packs/day: 0.25    Years: 1.00    Pack years: 0.25    Last attempt to quit: 07/10/1992    Years since quitting: 26.2   Smokeless tobacco: Never Used  Substance and Sexual Activity   Alcohol use: No    Alcohol/week: 0.0 standard drinks   Drug use: No   Sexual activity: Yes  Lifestyle   Physical activity:    Days per  week: 3 days    Minutes per session: 40 min   Stress: Not at all  Relationships   Social connections:    Talks on phone: More than three times a week    Gets together: More than three times a week    Attends religious service: More than 4 times per year    Active member of club or organization: Yes    Attends meetings of clubs or organizations: More than 4 times per year    Relationship status: Married  Other Topics Concern   Not on file  Social History Narrative   Married. Education: [employer].   Exercise: some   Allergies  Allergen Reactions   Nexium [Esomeprazole Magnesium]     Awful regurgitation   Family History  Problem Relation Age of Onset   Heart disease Mother    Stroke Mother    Heart disease Father 29       MI   Hypertension Father    Hypertension Brother    Diabetes Brother    Heart disease Brother    Hyperlipidemia Brother    Cancer Brother    Hyperlipidemia Sister    Coronary artery disease Brother 20  CABG   Colon cancer Paternal Grandmother      Current Outpatient Medications (Cardiovascular):    atorvastatin (LIPITOR) 40 MG tablet, TAKE ONE TABLET BY MOUTH DAILY   losartan (COZAAR) 25 MG tablet, TAKE ONE TABLET BY MOUTH DAILY   metoprolol succinate (TOPROL-XL) 25 MG 24 hr tablet, Take 0.5 tablets (12.5 mg total) by mouth daily.   tadalafil (CIALIS) 20 MG tablet, Use as directed.   ezetimibe (ZETIA) 10 MG tablet, Take 1 tablet (10 mg total) by mouth daily.  Current Outpatient Medications (Respiratory):    cetirizine (ZYRTEC) 10 MG tablet, Take 10 mg by mouth as directed.   Current Outpatient Medications (Analgesics):    aspirin EC 81 MG tablet, Take 81 mg by mouth daily.   Current Outpatient Medications (Other):    Cholecalciferol (VITAMIN D) 2000 units CAPS, Take 1 capsule by mouth daily.   Multiple Vitamin (MULTI-VITAMIN PO), Take 1 tablet by mouth.   Diclofenac Sodium 2 % SOLN, Place 2 g onto the skin 2 (two)  times daily.    Past medical history, social, surgical and family history all reviewed in electronic medical record.  No pertanent information unless stated regarding to the chief complaint.   Review of Systems:  No headache, visual changes, nausea, vomiting, diarrhea, constipation, dizziness, abdominal pain, skin rash, fevers, chills, night sweats, weight loss, swollen lymph nodes, body aches, joint swelling, chest pain, shortness of breath, mood changes.  Positive muscle aches  Objective  Blood pressure 128/80, pulse (!) 48, height 6\' 3"  (1.905 m), weight 215 lb (97.5 kg), SpO2 99 %.   General: No apparent distress alert and oriented x3 mood and affect normal, dressed appropriately.  HEENT: Pupils equal, extraocular movements intact  Respiratory: Patient's speak in full sentences and does not appear short of breath  Cardiovascular: No lower extremity edema, non tender, no erythema  Skin: Warm dry intact with no signs of infection or rash on extremities or on axial skeleton.  Abdomen: Soft nontender  Neuro: Cranial nerves II through XII are intact, neurovascularly intact in all extremities with 2+ DTRs and 2+ pulses.  Lymph: No lymphadenopathy of posterior or anterior cervical chain or axillae bilaterally.  Gait normal with good balance and coordination.  MSK:  Non tender with full range of motion and good stability and symmetric strength and tone of shoulders, elbows,  hip, knee and ankles bilaterally.  Wrist: Right Inspection normal with no visible erythema or swelling. ROM smooth and normal with good flexion and extension and ulnar/radial deviation that is symmetrical with opposite wrist. Palpation is normal over metacarpals, navicular, lunate, and TFCC; tendons without tenderness/ swelling No snuffbox tenderness. No tenderness over Canal of [place]. Strength 5/5 in all directions without pain. Positive Finkelstein, negative Tinel's and phalens. Negative Watson's test. Contralateral  wrist unremarkable  MSK US performed of: Right wrist This study was ordered, performed, and interpreted by Charlann Boxer D.O.  Wrist: All extensor compartments visualized and tendons severe tendon sheath effusion noted of the abductor pollicis longus tendon sheath Power doppler signal normal.  IMPRESSION: De Quervain's tenosynovitis  Procedure: Real-time Ultrasound Guided Injection of right Abductor pollicis longs tendon sheath Device: GE Logiq E  Ultrasound guided injection is preferred based studies that show increased duration, increased effect, greater accuracy, decreased procedural pain, increased response rate with ultrasound guided versus blind injection.  Verbal informed consent obtained.  Time-out conducted.  Noted no overlying erythema, induration, or other signs of local infection.  Skin prepped in a sterile fashion.  Local  anesthesia: Topical Ethyl chloride.  With sterile technique and under real time ultrasound guidance:  tendon visualized.  23g 5/8 inch needle inserted distal to proximal approach into tendon sheath. Pictures taken  for needle placement. Patient did have injection of 0.5 cc of 0.5% Marcaine, and 0.5 cc of Kenalog 40 mg/dL. Completed without difficulty  Pain immediately resolved suggesting accurate placement of the medication.  Advised to call if fevers/chills, erythema, induration, drainage, or persistent bleeding.  Images permanently stored and available for review in the ultrasound unit.  Impression: Technically successful ultrasound guided injection.   Impression and Recommendations:     This case required medical decision making of moderate complexity. The above documentation has been reviewed and is accurate and complete Lyndal Pulley, DO       Note: This dictation was prepared with Dragon dictation along with smaller phrase technology. Any transcriptional errors that result from this process are unintentional.

## 2018-10-07 ENCOUNTER — Ambulatory Visit: Payer: PPO | Admitting: Family Medicine

## 2018-10-07 ENCOUNTER — Encounter: Payer: Self-pay | Admitting: Family Medicine

## 2018-10-07 ENCOUNTER — Ambulatory Visit: Payer: Self-pay

## 2018-10-07 ENCOUNTER — Other Ambulatory Visit: Payer: Self-pay

## 2018-10-07 VITALS — BP 128/80 | HR 48 | Ht 75.0 in | Wt 215.0 lb

## 2018-10-07 DIAGNOSIS — M654 Radial styloid tenosynovitis [de Quervain]: Secondary | ICD-10-CM | POA: Diagnosis not present

## 2018-10-07 DIAGNOSIS — M25531 Pain in right wrist: Secondary | ICD-10-CM

## 2018-10-07 MED ORDER — DICLOFENAC SODIUM 2 % TD SOLN: 2.0000 g | Freq: Two times a day (BID) | TRANSDERMAL | 3 refills | Status: DC

## 2018-10-07 NOTE — Patient Instructions (Signed)
Good to see you  Wear brace day and night for 1 week then nightly for 2 weeks Ice 20 minutes 2 times daily. Usually after activity and before bed. pennsaid pinkie amount topically 2 times daily as needed.  Exercises 3 times a week.  Injected the tendon sheath today  See me again in 6 weeks if not perfect, can contact me in my chart as well or web-Ex visits.

## 2018-10-07 NOTE — Assessment & Plan Note (Signed)
Patient given injection.  Discussed icing regimen and home exercise.  Discussed which activities of doing which wants to avoid.  Patient will do bracing more regularly for the next week and then nightly for 2 weeks.  Discussed icing regimen and home exercise.  Discussed avoiding certain repetitive motions like to get more problems.  Trial of topical anti-inflammatories.  Follow-up again 4 to 8 weeks

## 2018-10-09 ENCOUNTER — Encounter: Payer: Self-pay | Admitting: Cardiovascular Disease

## 2018-10-09 ENCOUNTER — Telehealth: Payer: PPO | Admitting: Cardiovascular Disease

## 2018-10-10 ENCOUNTER — Other Ambulatory Visit: Payer: Self-pay | Admitting: Cardiovascular Disease

## 2018-10-10 ENCOUNTER — Other Ambulatory Visit: Payer: Self-pay

## 2018-10-10 DIAGNOSIS — E785 Hyperlipidemia, unspecified: Secondary | ICD-10-CM

## 2018-10-11 ENCOUNTER — Encounter: Payer: Self-pay | Admitting: Cardiovascular Disease

## 2018-10-11 ENCOUNTER — Telehealth (INDEPENDENT_AMBULATORY_CARE_PROVIDER_SITE_OTHER): Payer: PPO | Admitting: Cardiovascular Disease

## 2018-10-11 DIAGNOSIS — R0683 Snoring: Secondary | ICD-10-CM | POA: Diagnosis not present

## 2018-10-11 DIAGNOSIS — I10 Essential (primary) hypertension: Secondary | ICD-10-CM

## 2018-10-11 DIAGNOSIS — E785 Hyperlipidemia, unspecified: Secondary | ICD-10-CM

## 2018-10-11 DIAGNOSIS — Z8249 Family history of ischemic heart disease and other diseases of the circulatory system: Secondary | ICD-10-CM

## 2018-10-11 DIAGNOSIS — R7301 Impaired fasting glucose: Secondary | ICD-10-CM

## 2018-10-11 NOTE — Patient Instructions (Signed)
Medication Instructions:  Take Losartan in the morning. Move Metoprolol 12.5 mg dose to the evening before bed.  Monitor BP for 1 week, if the BP is still increased you may take 1/2 tablet of losartan in the evening as well.   If you need a refill on your cardiac medications before your next appointment, please call your pharmacy.    Follow-Up: At [facility], you and your health needs are our priority.  As part of our continuing mission to provide you with exceptional heart care, we have created designated Provider Care Teams.  These Care Teams include your primary Cardiologist (physician) and Advanced Practice Providers (APPs -  Physician Assistants and Nurse Practitioners) who all work together to provide you with the care you need, when you need it. You will need a follow up appointment in 4-6 months.  Please call our office 2 months in advance to schedule this appointment.  You may see Shelva Majestic, MD or one of the following Advanced Practice Providers on your designated Care Team: [place], [place] . Fabian Sharp, PA-C

## 2018-10-11 NOTE — Progress Notes (Signed)
Virtual Visit via Video Note     Evaluation Performed:  Follow-up visit  This visit type was conducted due to national recommendations for restrictions regarding the COVID-19 Pandemic (e.g. social distancing).  This format is felt to be most appropriate for this patient at this time.  All issues noted in this document were discussed and addressed.  No physical exam was performed (except for noted visual exam findings with Video Visits).  Please refer to the patient's chart (MyChart message for video visits and phone note for telephone visits) for the patient's consent to telehealth for [facility]  Verbal consent was obtained with the patient to proceed with this visit and bill insurance.  Date:  10/11/2018   ID:  Clarence Ware, DOB 23-Feb-1951, MRN [id number]  Patient Location:  [place] [address]   Provider location:   [facility]  PCP:  [name], MD  Cardiologist:  Shelva Majestic, MD  Electrophysiologist:  None   Chief Complaint: Blood pressure lability  History of Present Illness:    Clarence Ware is a 68 y.o. male who presents via audio/video conferencing for a telehealth visit today.    The patient does not have symptoms concerning for COVID-19 infection (fever, chills, cough, or new SHORTNESS OF BREATH).   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  Clarence Ware has a history of hyperlipidemia and hypertension.  Over 10 years ago he had a normal routine treadmill test. The patient has continued to exercise regularly.  He  has a history of hyperlipidemia, erectile dysfunction, a history of intermittent headaches.  When I saw him on 11/07/2012, we discussed the sensitivity and specificity of routine treadmill testing with a strong  family history I elected to schedule him for a cardiopulmonary met test for further evaluation would also be helpful to assess endothelial dysfunction/diastolic dysfunction evaluate potential for microvascular angina. He also had a soft cardiac murmur at his apex and recommended a 2-D echo Doppler study.  His cardiopulmonary met test revealed that he had good functional capacity with an excellent maximum oxygen consumption peak of 92% of predicted. However, he had a abnormal response during the last several minutes of exercise suggesting ischemic myocardial dysfunction with decreasing stroke volume with increasing work rate which corresponded to an abrupt decrease in cardiac output suggestive of exercise-induced myocardial dysfunction. He did have very mild ventilation/perfusion mismatch with reference to his pulmonary status.  A 2-D echo Doppler study showed normal systolic function with an ejection fraction of 55-60%. He had normal diastolic parameters.  When I saw him in followup of his noninvasive studies in June 2014 due to the mild abnormality noted during the last 7 minutes of exercise I recommended the addition of ARB therapy and further titrated his atorvastatin for aggressive lipid management. He has tolerated initiation of losartan.  I last saw him on most 2 years ago.  He denied any  chest pain or palpitations. He He did have a followup NMR lipoprotein of which showed an LDL particle number of 1216 with a calculated LDL of 86, HDL 46, triglycerides 58, total cholesterol 144. He had 552 small LDL some particles. HDL particle number was reduced at 29.1. Insulin resistance score was normal at 33.  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  However, he is still walking at least 3 days per week for a minimum of 30 minutes.  He underwent a sleep study at [facility] sleep at [facility] on 05/03/201 and was not found to have  significant obstructive sleep apnea and his AHI and RDI were both 2.9.  He has significant periodic limb movements of sleep and had snoring.  He tells me he had a mouth piece customized by Dr. Oneal Grout for treatment of his snoring.   He uses his mouthguard to sleep and denies any awareness of breakthrough snoring.  He continues to be on losartan 25 mg daily, and metoprolol succinate 25 mg daily for hypertension.  He has continued to be on atorvastatin 20 mg.   Laboratory  One year ago showed an LDL cholesterol, which had risen up to 90.  I further titrated atorvastatin to 40 mg.  I last saw him in June 2019.  Since that time, he had been on Sartain 25 mg daily and he self reduced his metoprolol succinate from 25 mg down to 12.5 mg daily due to noticing his heart rate was running somewhat low.  He also recently has started to notice that particularly following exertion his blood pressure at times have gotten low and he has recorded blood pressure levels of 114/71 with a pulse of 67, 106/68 with a pulse of 65, 95/60 with a pulse of 63 and on 1 occasion systolic blood pressure had dropped to as low as 88.  He has been taking both his losartan 25 mg  as well as metoprolol succinate in the morning.  He has some seasonal allergies and has had some allergic rhinitis with the recent pollen levels that have increased.  He took his blood pressure this morning and this was elevated at 148/97 prior to taking his morning medications.  He rechecked his blood pressure later during this interview several hours after he had taken his medications and his blood pressure was now 121/81 with a pulse in the mid 60s.  He had called the office the other day in light of his recent blood pressure getting low at times and this telemedicine video conference was arranged.  Upon further questioning he is sleeping well with his oral appliance.  He denies any episodes of chest tightness or pressure.  He is unaware of any palpitations.  He  did denies any frank syncope.  He recently had an injection in that tendon in his hand and has a cast on his right arm.  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   Prior CV studies:   The following studies were reviewed today:  ECHO 2014 Study Conclusions  - Left ventricle: The cavity size was normal. Systolic function was normal. The estimated ejection fraction was in the range of 55% to 60%. Wall motion was normal; there were no regional wall motion abnormalities. Left ventricular diastolic function parameters were normal. - Mitral valve: Valve area by pressure half-time: 1.95cm^2.   Past Medical History:  Diagnosis Date   Allergy    Hyperlipidemia    Hypertension    Substance abuse [facility])    Past Surgical History:  Procedure Laterality Date   INGUINAL HERNIA REPAIR Right 03/10/2015   Procedure: OPEN RIGHT INGUINAL HERNIA REPAIR;  Surgeon: Johnathan Hausen, MD;  Location: [facility];  Service: General;  Laterality: Right;  With MESH  VASECTOMY       Current Meds  Medication Sig   aspirin EC 81 MG tablet Take 81 mg by mouth daily.   atorvastatin (LIPITOR) 40 MG tablet TAKE ONE TABLET BY MOUTH DAILY   cetirizine (ZYRTEC) 10 MG tablet Take 10 mg by mouth as directed.    Cholecalciferol (VITAMIN D) 2000 units CAPS Take 1 capsule by mouth daily.   Diclofenac Sodium 2 % SOLN Place 2 g onto the skin 2 (two) times daily.   losartan (COZAAR) 25 MG tablet TAKE ONE TABLET BY MOUTH DAILY   metoprolol succinate (TOPROL-XL) 25 MG 24 hr tablet Take 0.5 tablets (12.5 mg total) by mouth daily.   Multiple Vitamin (MULTI-VITAMIN PO) Take 1 tablet by mouth.   tadalafil (CIALIS) 20 MG tablet Use as directed.     Allergies:   Nexium [esomeprazole magnesium]   Social History   Tobacco Use   Smoking status: Former Smoker    Packs/day: 0.25    Years: 1.00    Pack years: 0.25    Last attempt to quit:  07/10/1992    Years since quitting: 26.2   Smokeless tobacco: Never Used  Substance Use Topics   Alcohol use: No    Alcohol/week: 0.0 standard drinks   Drug use: No     Family Hx: The patient's family history includes Cancer in his brother; Colon cancer in his paternal grandmother; Coronary artery disease (age of onset: 42) in his brother; Diabetes in his brother; Heart disease in his brother and mother; Heart disease (age of onset: 11) in his father; Hyperlipidemia in his brother and sister; Hypertension in his brother and father; Stroke in his mother.  ROS:   Please see the history of present illness.    Negative for fever chills or night sweats.  No cough.  Recent blood pressure lability as noted above.  No chest pain.  No palpitations.  Sleeping well.  Recent injection into his right hand tendon.  Recent laboratory did show mild glucose increase in mild increase hemoglobin A1c at 6.2.  He is exercising at least 2 to 3 miles every day particularly now that he is at home.  He is sleeping well.  He denies breakthrough snoring as long as he uses his mouth piece.  He denies daytime sleepiness. All other systems reviewed and are negative.   Labs/Other Tests and Data Reviewed:    Recent Labs: 07/22/2018: ALT 34; BUN 20; Creatinine, Ser 1.14; Hemoglobin 14.7; Platelets 209.0; Potassium 4.7; Sodium 138; TSH 1.87   Recent Lipid Panel Lab Results  Component Value Date/Time   CHOL 124 07/22/2018 08:59 AM   CHOL 150 10/24/2017 08:09 AM   CHOL 144 03/03/2013 08:55 AM   TRIG 66.0 07/22/2018 08:59 AM   TRIG 58 03/03/2013 08:55 AM   HDL 41.40 07/22/2018 08:59 AM   HDL 48 10/24/2017 08:09 AM   HDL 46 03/03/2013 08:55 AM   CHOLHDL 3 07/22/2018 08:59 AM   LDLCALC 70 07/22/2018 08:59 AM   LDLCALC 89 10/24/2017 08:09 AM   LDLCALC 86 03/03/2013 08:55 AM    Wt Readings from Last 3 Encounters:  10/11/18 210 lb (95.3 kg)  10/09/18 209 lb 6.4 oz (95 kg)  10/07/18 215 lb (97.5 kg)     Exam:     Vital Signs:  BP (!) 148/97    Pulse 70    Ht '6\' 3"'$  (1.905 m)    Wt 210 lb (95.3 kg)    BMI 26.25 kg/m  Well nourished, well developed male in no acute distress. HEENT appears grossly normal.  He does have a goatee.  There does not appear to be any obvious JVD.  There is no neck vein distention.  He denies any wheezing.  His respirations are regular and not labored.  When asked to plate his chest wall there was no tenderness.  His abdomen was nontender.  He does not have leg swelling.  He denies any tremor but he a partial cast on his right hand and forearm.  Neurologically he appears to be intact.  He is alert and oriented with normal affect.  ASSESSMENT & PLAN:    1.  Blood pressure lability: Check him he has been on losartan 25 mg daily in addition to metoprolol succinate 12.5 mg.  He had self reduced his metoprolol succinate from 25 mg since his last evaluation apparently due to bradycardia and mild blood pressure reduction.  Recently he believes his blood pressure has been fairly stable but he has noticed after significant exertion his blood pressure tends to be low.  I discussed that he may have some associated vasodilation as result of exercise.  He denies any exertional chest tightness or exertional shortness of breath and I suspect this is not ischemia mediated.  I have recommended that he continue at present the losartan 25 mg in the morning but change the timing of his metoprolol succinate so as to take 12.5 mg at bedtime.  This should reduce an early morning trough in his medications such that hopefully his early a.m. blood pressure will be more normal and not elevated as it is today.  He will monitor his early morning blood pressure and if this continues to be elevated I have suggested adding an additional losartan 12.5 mg at bedtime to his medical regimen.  2.  Family history for CAD and cardiovascular disease: His father died of an MI at age 42 and a brother underwent CABG  revascularization surgery as well as another brother having an aortic aneurysm.  He is not having any chest pain or change in exercise.  Now that we are mandated to stay at home he is actually able to exercise more and is walking at least several times per day for minimum of 2 to 3 miles.  He previously had a cardiopulmonary met test which showed excellent minimum oxygen consumption peak but there was an abnormal response during the last several minutes of exercise.  He is on beta-blocker therapy and ARB therapy.  3.  Hyperlipidemia: Most recent LDL cholesterol in January 2020 was 70, which is improved on his current dose of atorvastatin 40 mg daily.  4.  Allergic rhinitis: He takes generic Claritin on an as-needed basis.  There is no wheezing.  5.  Snoring: He continues to use an oral appliance for sleep.  He is sleeping well.  6. Elevated hemoglobin A1c at 6.2 consistent with prediabetes.  I discussed diet, reduction in carbohydrates and sweets.  His level had risen from 5.9 previously.  COVID-19 Education: The signs and symptoms of COVID-19 were discussed with the patient and how to seek care for testing (follow up with PCP or arrange E-visit).  The importance of social distancing was discussed today.  Patient Risk:   After full review of this patients clinical status, I feel that they are at least moderate risk at this time.  Time:   Today, I have spent 25 minutes with the patient and  telehealth technology.   Medication Adjustments/Labs and  Tests Ordered: Current medicines are reviewed at length with the patient today.  Concerns regarding medicines are outlined above.  Tests Ordered: No orders of the defined types were placed in this encounter.  Medication Changes: No orders of the defined types were placed in this encounter.   Disposition:  4-6 months  Signed, Shelva Majestic, MD  10/11/2018 10:34 AM    [place]

## 2018-10-15 ENCOUNTER — Encounter: Payer: Self-pay | Admitting: Family Medicine

## 2018-10-23 DIAGNOSIS — N400 Enlarged prostate without lower urinary tract symptoms: Secondary | ICD-10-CM | POA: Diagnosis not present

## 2018-11-13 ENCOUNTER — Other Ambulatory Visit: Payer: Self-pay | Admitting: Internal Medicine

## 2018-11-13 DIAGNOSIS — I10 Essential (primary) hypertension: Secondary | ICD-10-CM

## 2018-11-21 NOTE — Progress Notes (Signed)
[facility] [place],  [address] Phone: [phone] Subjective:     CC: Right wrist follow-up  IPJ:[id number]   10/07/2018: Patient given injection.  Discussed icing regimen and home exercise.  Discussed which activities of doing which wants to avoid.  Patient will do bracing more regularly for the next week and then nightly for 2 weeks.  Discussed icing regimen and home exercise.  Discussed avoiding certain repetitive motions like to get more problems.  Trial of topical anti-inflammatories.  Follow-up again 4 to 8 weeks  Update 11/22/2018: Clarence Ware is a 68 y.o. male coming in with complaint of right wrist pain. Did wear brace. Has not been wearing it recently. Is having sharp pain below the [facility] joint. Pain with axial loading of his thumb.  Patient was seen previously and had more "incident synovitis.  Was given an injection at that time patient continues to have discomfort unfortunately patient states that the range of motion has improved him tremendously but unfortunately the pain is not made any significant difference.      Past Medical History:  Diagnosis Date  . Allergy   . Hyperlipidemia   . Hypertension   . Substance abuse [facility])    Past Surgical History:  Procedure Laterality Date  . INGUINAL HERNIA REPAIR Right 03/10/2015   Procedure: OPEN RIGHT INGUINAL HERNIA REPAIR;  Surgeon: Johnathan Hausen, MD;  Location: [facility];  Service: General;  Laterality: Right;  With MESH  . VASECTOMY     Social History   Socioeconomic History  . Marital status: Married    Spouse name: Not on file  . Number of children: 2  . Years of education: Not on file  . Highest education level: Not on file  Occupational History  . Occupation: Marine scientist: [employer]  . Financial resource strain: Not hard at all  . Food insecurity:    Worry: Never true    Inability: Never true  . Transportation needs:    Medical: No     Non-medical: No  Tobacco Use  . Smoking status: Former Smoker    Packs/day: 0.25    Years: 1.00    Pack years: 0.25    Last attempt to quit: 07/10/1992    Years since quitting: 26.3  . Smokeless tobacco: Never Used  Substance and Sexual Activity  . Alcohol use: No    Alcohol/week: 0.0 standard drinks  . Drug use: No  . Sexual activity: Yes  Lifestyle  . Physical activity:    Days per week: 3 days    Minutes per session: 40 min  . Stress: Not at all  Relationships  . Social connections:    Talks on phone: More than three times a week    Gets together: More than three times a week    Attends religious service: More than 4 times per year    Active member of club or organization: Yes    Attends meetings of clubs or organizations: More than 4 times per year    Relationship status: Married  Other Topics Concern  . Not on file  Social History Narrative   Married. Education: [employer].   Exercise: some   Allergies  Allergen Reactions  . Nexium [Esomeprazole Magnesium]     Awful regurgitation   Family History  Problem Relation Age of Onset  . Heart disease Mother   . Stroke Mother   . Heart disease Father 41  MI  . Hypertension Father   . Hypertension Brother   . Diabetes Brother   . Heart disease Brother   . Hyperlipidemia Brother   . Cancer Brother   . Hyperlipidemia Sister   . Coronary artery disease Brother 58       CABG  . Colon cancer Paternal Grandmother      Current Outpatient Medications (Cardiovascular):  .  atorvastatin (LIPITOR) 40 MG tablet, TAKE ONE TABLET BY MOUTH DAILY .  ezetimibe (ZETIA) 10 MG tablet, Take 1 tablet (10 mg total) by mouth daily. Marland Kitchen  losartan (COZAAR) 25 MG tablet, TAKE ONE TABLET BY MOUTH DAILY .  metoprolol succinate (TOPROL-XL) 25 MG 24 hr tablet, Take 0.5 tablets (12.5 mg total) by mouth daily. .  tadalafil (CIALIS) 20 MG tablet, Use as directed.  Current Outpatient Medications (Respiratory):  .  cetirizine (ZYRTEC) 10  MG tablet, Take 10 mg by mouth as directed.   Current Outpatient Medications (Analgesics):  .  aspirin EC 81 MG tablet, Take 81 mg by mouth daily.   Current Outpatient Medications (Other):  Marland Kitchen  Cholecalciferol (VITAMIN D) 2000 units CAPS, Take 1 capsule by mouth daily. .  Diclofenac Sodium 2 % SOLN, Place 2 g onto the skin 2 (two) times daily. .  Multiple Vitamin (MULTI-VITAMIN PO), Take 1 tablet by mouth.    Past medical history, social, surgical and family history all reviewed in electronic medical record.  No pertanent information unless stated regarding to the chief complaint.   Review of Systems:  No headache, visual changes, nausea, vomiting, diarrhea, constipation, dizziness, abdominal pain, skin rash, fevers, chills, night sweats, weight loss, swollen lymph nodes, body aches, joint swelling, muscle aches, chest pain, shortness of breath, mood changes.   Objective  There were no vitals taken for this visit. Systems examined below as of    General: No apparent distress alert and oriented x3 mood and affect normal, dressed appropriately.  HEENT: Pupils equal, extraocular movements intact  Respiratory: Patient's speak in full sentences and does not appear short of breath  Cardiovascular: No lower extremity edema, non tender, no erythema  Skin: Warm dry intact with no signs of infection or rash on extremities or on axial skeleton.  Abdomen: Soft nontender  Neuro: Cranial nerves II through XII are intact, neurovascularly intact in all extremities with 2+ DTRs and 2+ pulses.  Lymph: No lymphadenopathy of posterior or anterior cervical chain or axillae bilaterally.  Gait normal with good balance and coordination.  MSK:  Non tender with full range of motion and good stability and symmetric strength and tone of shoulders, elbows, hip, knee and ankles bilaterally.  Right wrist exam shows the patient does have some mild swelling still over the distal aspect of the radius.  Patient does  have near full range of motion with some mild pain with dorsiflexion of the wrist.  No pain over the scaphoid bone itself.  Patient does have a positive Wynn Maudlin still noted.  Limited musculoskeletal ultrasound was performed and interpreted by Lyndal Pulley  Limited ultrasound shows the patient did have hypoechoic changes with increasing Doppler flow still noted at the distal aspect of the abductor pollicis longus tendon crossing more the [facility] joint and the distal radius   Impression and Recommendations:     This case required medical decision making of moderate complexity. The above documentation has been reviewed and is accurate and complete Lyndal Pulley, DO       Note: This dictation was prepared with Viviann Spare  dictation along with smaller phrase technology. Any transcriptional errors that result from this process are unintentional.

## 2018-11-22 ENCOUNTER — Ambulatory Visit: Payer: Self-pay

## 2018-11-22 ENCOUNTER — Ambulatory Visit (INDEPENDENT_AMBULATORY_CARE_PROVIDER_SITE_OTHER)
Admission: RE | Admit: 2018-11-22 | Discharge: 2018-11-22 | Disposition: A | Discharge status: Home / Self Care | Payer: PPO | Source: Ambulatory Visit | Attending: Family Medicine | Admitting: Family Medicine

## 2018-11-22 ENCOUNTER — Ambulatory Visit: Payer: PPO | Admitting: Family Medicine

## 2018-11-22 ENCOUNTER — Encounter: Payer: Self-pay | Admitting: Family Medicine

## 2018-11-22 ENCOUNTER — Other Ambulatory Visit: Payer: Self-pay

## 2018-11-22 VITALS — BP 130/86 | HR 73 | Ht 75.0 in | Wt 213.0 lb

## 2018-11-22 DIAGNOSIS — M654 Radial styloid tenosynovitis [de Quervain]: Secondary | ICD-10-CM | POA: Diagnosis not present

## 2018-11-22 DIAGNOSIS — M25531 Pain in right wrist: Secondary | ICD-10-CM

## 2018-11-22 MED ORDER — NITROGLYCERIN 0.2 MG/HR TD PT24: MEDICATED_PATCH | TRANSDERMAL | 1 refills | Status: DC

## 2018-11-22 NOTE — Assessment & Plan Note (Signed)
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  Patient's ultrasound did show that there is some swelling noted.  We discussed with patient about icing regimen and bracing.  We discussed nitroglycerin patches and was prescribed.  Warned of the possible side effects.  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  In addition to this we discussed formal physical therapy that I think will be beneficial referral placed.  Patient should follow-up again in 4 to 6 weeks.  If continuing to have pain I would consider the possibility of MRI to rule out dissection differential as avascular necrosis of the scaphoid bone but no pain noted today.

## 2018-11-22 NOTE — Patient Instructions (Addendum)
Good to see you  Sorry not better Xray downstairs Ice 20 minutes 2 times daily. Usually after activity and before bed. Nitroglycerin Protocol   Apply 1/4 nitroglycerin patch to affected area daily.  Change position of patch within the affected area every 24 hours.  You may experience a headache during the first 1-2 weeks of using the patch, these should subside.  If you experience headaches after beginning nitroglycerin patch treatment, you may take your preferred over the counter pain reliever.  Another side effect of the nitroglycerin patch is skin irritation or rash related to patch adhesive.  Please notify our office if you develop more severe headaches or rash, and stop the patch.  Tendon healing with nitroglycerin patch may require 12 to 24 weeks depending on the extent of injury.  Men should not use if taking Viagra, Cialis, or Levitra.   Do not use if you have migraines or rosacea. Wear brace with activity and may be still at night See me again in 4 weeks and if not better we will considder MRI

## 2018-11-24 ENCOUNTER — Other Ambulatory Visit: Payer: Self-pay | Admitting: Cardiovascular Disease

## 2018-12-23 ENCOUNTER — Ambulatory Visit: Payer: PPO | Admitting: Family Medicine

## 2018-12-23 ENCOUNTER — Ambulatory Visit: Payer: Self-pay

## 2018-12-23 ENCOUNTER — Encounter: Payer: Self-pay | Admitting: Internal Medicine

## 2018-12-23 ENCOUNTER — Encounter: Payer: Self-pay | Admitting: Family Medicine

## 2018-12-23 ENCOUNTER — Other Ambulatory Visit: Payer: Self-pay

## 2018-12-23 VITALS — BP 140/90 | HR 78 | Ht 75.0 in | Wt 216.0 lb

## 2018-12-23 DIAGNOSIS — M25531 Pain in right wrist: Secondary | ICD-10-CM

## 2018-12-23 DIAGNOSIS — M654 Radial styloid tenosynovitis [de Quervain]: Secondary | ICD-10-CM

## 2018-12-23 NOTE — Progress Notes (Signed)
[facility] [place], [place] [address] Phone: [phone] Subjective:   [name] am serving as a Education administrator for Dr. Hulan Saas.   CC: Wrist pain follow-up  RCV:[id number]   11/22/2018 Patient symptomatology is consistent with a reactive de Quervain's tenosynovitis.  Patient's ultrasound did show that there is some swelling noted.  We discussed with patient about icing regimen and bracing.  We discussed nitroglycerin patches and was prescribed.  Warned of the possible side effects.  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  In addition to this we discussed formal physical therapy that I think will be beneficial referral placed.  Patient should follow-up again in 4 to 6 weeks.  If continuing to have pain I would consider the possibility of MRI to rule out dissection differential as avascular necrosis of the scaphoid bone but no pain noted today.  12/23/2018 Clarence Ware is a 68 y.o. male coming in with complaint of wrist pain. Wrist is still painful.  Patient was seen previously and diagnosed with more of a tenosynovitis.  There was a possibility for a nerve tendon with intrasubstance tearing.  Patient has been doing the nitroglycerin with no significant side effects except headache for the first week.  Is making improvement at this time but may be slowly.  Wants to know what other choices he has.     Past Medical History:  Diagnosis Date  . Allergy   . Hyperlipidemia   . Hypertension   . Substance abuse [facility])    Past Surgical History:  Procedure Laterality Date  . INGUINAL HERNIA REPAIR Right 03/10/2015   Procedure: OPEN RIGHT INGUINAL HERNIA REPAIR;  Surgeon: Johnathan Hausen, MD;  Location: [facility];  Service: General;  Laterality: Right;  With MESH  . VASECTOMY     Social History   Socioeconomic History  . Marital status: Married    Spouse name: Not on file  . Number of children: 2  . Years of  education: Not on file  . Highest education level: Not on file  Occupational History  . Occupation: Marine scientist: [employer]  . Financial resource strain: Not hard at all  . Food insecurity    Worry: Never true    Inability: Never true  . Transportation needs    Medical: No    Non-medical: No  Tobacco Use  . Smoking status: Former Smoker    Packs/day: 0.25    Years: 1.00    Pack years: 0.25    Quit date: 07/10/1992    Years since quitting: 26.4  . Smokeless tobacco: Never Used  Substance and Sexual Activity  . Alcohol use: No    Alcohol/week: 0.0 standard drinks  . Drug use: No  . Sexual activity: Yes  Lifestyle  . Physical activity    Days per week: 3 days    Minutes per session: 40 min  . Stress: Not at all  Relationships  . Social connections    Talks on phone: More than three times a week    Gets together: More than three times a week    Attends religious service: More than 4 times per year    Active member of club or organization: Yes    Attends meetings of clubs or organizations: More than 4 times per year    Relationship status: Married  Other Topics Concern  . Not on file  Social History Narrative  Married. Education: [employer].   Exercise: some   Allergies  Allergen Reactions  . Nexium [Esomeprazole Magnesium]     Awful regurgitation   Family History  Problem Relation Age of Onset  . Heart disease Mother   . Stroke Mother   . Heart disease Father 18       MI  . Hypertension Father   . Hypertension Brother   . Diabetes Brother   . Heart disease Brother   . Hyperlipidemia Brother   . Cancer Brother   . Hyperlipidemia Sister   . Coronary artery disease Brother 61       CABG  . Colon cancer Paternal Grandmother      Current Outpatient Medications (Cardiovascular):  .  atorvastatin (LIPITOR) 40 MG tablet, TAKE ONE TABLET BY MOUTH DAILY .  ezetimibe (ZETIA) 10 MG tablet, TAKE ONE TABLET BY MOUTH DAILY .  losartan  (COZAAR) 25 MG tablet, TAKE ONE TABLET BY MOUTH DAILY .  metoprolol succinate (TOPROL-XL) 25 MG 24 hr tablet, Take 0.5 tablets (12.5 mg total) by mouth daily. .  nitroGLYCERIN (NITRODUR - DOSED IN MG/24 HR) 0.2 mg/hr patch, 1/4 patch daily .  tadalafil (CIALIS) 20 MG tablet, Use as directed.  Current Outpatient Medications (Respiratory):  .  cetirizine (ZYRTEC) 10 MG tablet, Take 10 mg by mouth as directed.   Current Outpatient Medications (Analgesics):  .  aspirin EC 81 MG tablet, Take 81 mg by mouth daily.   Current Outpatient Medications (Other):  Marland Kitchen  Cholecalciferol (VITAMIN D) 2000 units CAPS, Take 1 capsule by mouth daily. .  Diclofenac Sodium 2 % SOLN, Place 2 g onto the skin 2 (two) times daily. .  Multiple Vitamin (MULTI-VITAMIN PO), Take 1 tablet by mouth.    Past medical history, social, surgical and family history all reviewed in electronic medical record.  No pertanent information unless stated regarding to the chief complaint.   Review of Systems:  No headache, visual changes, nausea, vomiting, diarrhea, constipation, dizziness, abdominal pain, skin rash, fevers, chills, night sweats, weight loss, swollen lymph nodes, body aches, joint swelling,  chest pain, shortness of breath, mood changes.  Positive muscle aches  Objective  Blood pressure 140/90, pulse 78, height 6\' 3"  (1.905 m), weight 216 lb (98 kg), SpO2 96 %.    General: No apparent distress alert and oriented x3 mood and affect normal, dressed appropriately.  HEENT: Pupils equal, extraocular movements intact  Respiratory: Patient's speak in full sentences and does not appear short of breath  Cardiovascular: No lower extremity edema, non tender, no erythema  Skin: Warm dry intact with no signs of infection or rash on extremities or on axial skeleton.  Abdomen: Soft nontender  Neuro: Cranial nerves II through XII are intact, neurovascularly intact in all extremities with 2+ DTRs and 2+ pulses.  Lymph: No  lymphadenopathy of posterior or anterior cervical chain or axillae bilaterally.  Gait normal with good balance and coordination.  MSK:  Non tender with full range of motion and good stability and symmetric strength and tone of shoulders, elbows,  hip, knee and ankles bilaterally.  Wrist: Right Inspection normal with no visible erythema or swelling. ROM smooth and normal with good flexion and extension and ulnar/radial deviation that is symmetrical with opposite wrist. Palpation is normal over metacarpals, navicular, lunate, and TFCC; tendons without tenderness/ swelling No snuffbox tenderness. No tenderness over Canal of Guyon. Strength 5/5 in all directions without pain. Positive Finkelstein, negative Tinel's and phalens. Negative Watson's test. Contralateral wrist unremarkable  MSK US performed of: Right wrist This study was ordered, performed, and interpreted by Charlann Boxer D.O.  Wrist: Abductor pollicis longus tendon does show the patient does have hypoechoic changes and increasing Doppler flow in the area.  No masses noted.  Does have what appears to be fairly significant neovascularization from previous imaging.  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 The above documentation has been reviewed and is accurate and complete Lyndal Pulley, DO       Note: This dictation was prepared with Dragon dictation along with smaller phrase technology. Any transcriptional errors that result from this process are unintentional.

## 2018-12-23 NOTE — Assessment & Plan Note (Signed)
Patient is making some improvement.  Ultrasound does show that patient has some healing of the tendon.  We discussed different treatment options including formal physical therapy or occupational therapy.  Patient will be referred today.  Continue the nitroglycerin at the same dosing.  In addition to this we discussed the possibility of an MRI for further evaluation which patient as well as I felt was not necessary at this point with patient making some improvement.  Follow-up again in 6 weeks

## 2018-12-23 NOTE — Patient Instructions (Addendum)
Good to see you Continue nitro See me again in 6 weeks

## 2018-12-27 ENCOUNTER — Other Ambulatory Visit: Payer: Self-pay

## 2018-12-27 ENCOUNTER — Ambulatory Visit: Payer: PPO | Attending: Family Medicine | Admitting: Occupational Therapy

## 2018-12-27 ENCOUNTER — Telehealth: Payer: Self-pay

## 2018-12-27 DIAGNOSIS — M6281 Muscle weakness (generalized): Secondary | ICD-10-CM

## 2018-12-27 DIAGNOSIS — M25531 Pain in right wrist: Secondary | ICD-10-CM | POA: Diagnosis not present

## 2018-12-27 NOTE — Telephone Encounter (Signed)
Hello Dr. Tamala Julian,   I evaluated [name] today for Rt DeQuervains tenosynovitis/wrist pain.  I know he has already had a steroid injection, but do you think iontophoresis would be ok at this time (equivalent to steroid injection but smaller increments over 6 doses, spread out over approx 3 weeks)?  If you agree, would it be ok just to remove the pain patch the days he gets iontophoresis, and continue to wear pain patches the between days, OR would you prefer to discontinue pain patches until he's completely done with iontophoresis treatments?  Also, is ice massage ok to use w/ pain patches or should he remove before performing?  (Iontophoresis order is included when you sign off on the evaluation - sent via Epic)  Thank you,  Redmond Baseman, OTR/L [place]

## 2018-12-27 NOTE — Therapy (Signed)
[facility] [address] [place], [place], [address] Phone: [phone]   Fax:  [phone]  Occupational Therapy Evaluation  Patient Details  Name: Clarence Ware MRN: [id number] Date of Birth: Apr 15, 1951 Referring Provider (OT): Dr. Drusilla Kanner   Encounter Date: 12/27/2018  OT End of Session - 12/27/18 0958    Visit Number  1    Number of Visits  16    Date for OT Re-Evaluation  02/26/19    Authorization Type  [employer]    Authorization - Visit Number  1    Authorization - Number of Visits  10    OT Start Time  0800    OT Stop Time  0845    OT Time Calculation (min)  45 min    Activity Tolerance  Patient tolerated treatment well    Behavior During Therapy  [facility] for tasks assessed/performed       Past Medical History:  Diagnosis Date  . Allergy   . Hyperlipidemia   . Hypertension   . Substance abuse [facility])     Past Surgical History:  Procedure Laterality Date  . INGUINAL HERNIA REPAIR Right 03/10/2015   Procedure: OPEN RIGHT INGUINAL HERNIA REPAIR;  Surgeon: Johnathan Hausen, MD;  Location: [facility];  Service: General;  Laterality: Right;  With MESH  . VASECTOMY      There were no vitals filed for this visit.  Subjective Assessment - 12/27/18 0803    Pertinent History  Pain Rt wrist started months ago PMH: HTN    Special Tests  (+) Finklesteins for DeQuervains    Currently in Pain?  Yes    Pain Score  2    up to 10/10   Pain Location  Wrist   and thumb   Pain Orientation  Right    Pain Descriptors / Indicators  Sharp    Pain Type  Acute pain    Pain Frequency  Intermittent    Aggravating Factors   hitting it, certain movements    Pain Relieving Factors  OTC meds        [facility] OT Assessment - 12/27/18 0001      Assessment   Medical Diagnosis  Rt wrist pain   MD notes indicate DeQuervains tenosynovitis   Referring Provider (OT)  Dr. Drusilla Kanner    Onset Date/Surgical Date  --    Fall 2019   Hand Dominance  Left    Next MD Visit  February 04, 2019      Precautions   Precautions  Other (comment)    Precaution Comments  wears brace with heavy lifting and at night    Required Braces or Orthoses  Other Brace/Splint    Other Brace/Splint  pre-fab thumb spica brace      Home  Environment   Lives With  Spouse      Prior Function   Level of Independence  Independent    Vocation  Part time employment    Chief Technology Officer in [place]      ADL   Eating/Feeding  Independent    Grooming  Modified independent   pain if using Rt hand some w/ manual toothbrush   Upper Body Bathing  Modified independent   certain movements painful   Lower Body Bathing  Modified independent    Upper Body Dressing  Independent    Lower Body Dressing  Independent    Toilet Transfer  Independent  Toileting -  Hygiene  Independent   w/ Lt dominant Furniture conservator/restorer  Independent    ADL comments  Pain with holding pots and wringing out washclothes      IADL   Shopping  Takes care of all shopping needs independently    Light Housekeeping  Performs light daily tasks such as dishwashing, bed making    Meal Prep  Plans, prepares and serves adequate meals independently   pain if holding pot in Rt hand   Community Mobility  Drives own vehicle    Medication Management  Is responsible for taking medication in correct dosages at correct time      Mobility   Mobility Status  Independent      Written Expression   Dominant Hand  Left    Handwriting  --   no changes     Vision - History   Baseline Vision  Wears glasses only for reading      Observation/Other Assessments   Observations  (+) Finklesteins test. Pt has had symptoms since Fall 2019 per pt report. Only clinical impairment is 3 pt pinch on Rt side, however pain at times up to 10/10 consistent with DeQuervains, and certain functional tasks cause more pain. Pt also reports previous  steroid injection and currently on pain patch at base of radial thumb      Sensation   Additional Comments  denies changes      Coordination   Coordination  denies problems - appears intact      ROM / Strength   AROM / PROM / Strength  AROM      AROM   Overall AROM Comments  BUE AROM WNL's at shoulders/elbows. wrist ext = 55 bilaterally, Rt wrist flex = 60* (Lt = 65*). Thumb ROM WFL's      Hand Function   Right Hand Grip (lbs)  84 lbs    Right Hand Lateral Pinch  24 lbs    Right Hand 3 Point Pinch  16 lbs    Left Hand Grip (lbs)  75    Left Hand Lateral Pinch  21 lbs    Left 3 point pinch  18 lbs                      OT Education - 12/27/18 0956    Education Details  OT POC, OT findings, adaptations for holding pot/pan, wringing out washcloth, avoiding wrist and thumb movement simultaneously    Person(s) Educated  Patient    Methods  Explanation    Comprehension  Verbalized understanding       OT Short Term Goals - 12/27/18 1231      OT SHORT TERM GOAL #1   Title  Independent with initial HEP (including wrist and thumb A/ROM separate, and isometric wrist strengthening)    Time  4    Period  Weeks    Status  New      OT SHORT TERM GOAL #2   Title  Independent with splint wear and care    Time  4    Period  Weeks    Status  New      OT SHORT TERM GOAL #3   Title  Pt to report decreased pain overall with task modifications/adaptations and A/E prn, as well as use of modalities, and splint prn    Time  4    Period  Weeks    Status  New  OT Long Term Goals - 12/27/18 1232      OT LONG TERM GOAL #1   Title  Pt to return to using Rt hand as assist/non dominant hand with pain consistently under 5/10    Baseline  up to 10/10    Time  8    Period  Weeks    Status  New      OT LONG TERM GOAL #2   Title  Rt hand 3 pt pinch to be 18 lbs or greater for opening wrappers, etc.    Time  8    Period  Weeks    Status  New      OT LONG TERM GOAL  #3   Title  Indepndent with updated strengthening HEP    Time  8    Period  Weeks    Status  New            Plan - 12/27/18 0959    Clinical Impression Statement  Pt is a 68 y.o. male who presents to outpatient rehab for Rt wrist pain, with MD notes showing DeQuervains tenosynovitis. Pt reports this pain began around the fall in 2019, but didn't see MD until late March 2020. Pt currently has pre-fab thumb spica splint but reports it is difficult to use when working/typing. Pt currently with decreased 3 pt pinch strength, pain along radial thumb/wrist, and difficulty/pain with certain activities that has not resolved with previous steroid injection. Pt would benefit from OT to addressing splinting, isometric ex's, adaptations for certain pain provoking tasks, and potential iontophoresis.    Occupational performance deficits (Please refer to evaluation for details):  ADL's;IADL's;Work    Body Structure / Function / Physical Skills  ADL;ROM;IADL;Strength;Pain;UE functional use    Rehab Potential  Good    Clinical Decision Making  Limited treatment options, no task modification necessary    Comorbidities Affecting Occupational Performance:  None    Modification or Assistance to Complete Evaluation   No modification of tasks or assist necessary to complete eval    OT Frequency  2x / week    OT Duration  8 weeks   anticipate only 6 weeks   OT Treatment/Interventions  Self-care/ADL training;Therapeutic exercise;Splinting;Ultrasound;Therapeutic activities;Iontophoresis;Paraffin;Cryotherapy;DME and/or AE instruction;Compression bandaging;Fluidtherapy;Moist Heat;Contrast Bath;Passive range of motion;Patient/family education    Plan  fabricate radial thumb spica splint for greater comformity and decreased bulkiness for typing/work related tasks, check on ionto order for following session    Consulted and Agree with Plan of Care  Patient       Patient will benefit from skilled therapeutic  intervention in order to improve the following deficits and impairments:   Body Structure / Function / Physical Skills: ADL, ROM, IADL, Strength, Pain, UE functional use       Visit Diagnosis: 1. Pain in right wrist   2. Muscle weakness (generalized)       Problem List Patient Active Problem List   Diagnosis Date Noted  . De Quervain's tenosynovitis, right 10/07/2018  . Benign prostatic hyperplasia without lower urinary tract symptoms 01/18/2017  . Routine general medical examination at a health care facility 01/18/2017  . Essential hypertension 12/18/2014  . Hyperlipidemia with target LDL less than 100 12/18/2014  . First degree heart block 03/30/2013  . Family history of coronary artery disease 03/07/2013  . IMPAIRED FASTING GLUCOSE 06/30/2010  . GERD 06/26/2008  . ERECTILE DYSFUNCTION, MILD 01/30/2008  . VENTRICULAR HYPERTROPHY, LEFT 11/29/2006  . COLON POLYP 09/06/2006  . ASTHMA, EXERCISE INDUCED 09/06/2006  Carey Bullocks, OTR/L 12/27/2018, 12:39 PM  [facility] [address] [place], [place], [address] Phone: [phone]   Fax:  [phone]  Name: Khyren Hing MRN: [id number] Date of Birth: 02/14/1951

## 2018-12-27 NOTE — Telephone Encounter (Signed)
Yes iontophoresis would be great  Yes he can remove the patch  Either or on the patch but maybe remove it to get better coverage of other modalities.

## 2019-01-03 ENCOUNTER — Encounter

## 2019-01-06 ENCOUNTER — Ambulatory Visit: Payer: PPO | Admitting: Occupational Therapy

## 2019-01-06 ENCOUNTER — Other Ambulatory Visit: Payer: Self-pay

## 2019-01-06 DIAGNOSIS — M25531 Pain in right wrist: Secondary | ICD-10-CM

## 2019-01-06 NOTE — Patient Instructions (Signed)
  Your Splint This splint should initially be fitted by a healthcare practitioner.  The healthcare practitioner is responsible for providing wearing instructions and precautions to the patient, other healthcare practitioners and care provider involved in the patient's care.  This splint was custom made for you. Please read the following instructions to learn about wearing and caring for your splint.  Precautions Should your splint cause any of the following problems, remove the splint immediately and contact your therapist/physician.  Swelling  Severe Pain  Pressure Areas  Stiffness  Numbness  Do not wear your splint while operating machinery unless it has been fabricated for that purpose.  When To Wear Your Splint Where your splint according to your therapist/physician instructions. Daytime, especially during aggravating activities. Can wear black brace at night if more comfortable  Care and Cleaning of Your Splint 1. Keep your splint away from open flames. 2. Your splint will lose its shape in temperatures over 135 degrees Farenheit, ( in car windows, near radiators, ovens or in hot water).  Never make any adjustments to your splint, if the splint needs adjusting remove it and make an appointment to see your therapist. 3. Your splint may be cleaned with rubbing alcohol.  Do not immerse in hot water over 135 degrees Farenheit.

## 2019-01-06 NOTE — Therapy (Signed)
[facility] [address] [place], [place], [address] Phone: [phone]   Fax:  [phone]  Occupational Therapy Treatment  Patient Details  Name: Clarence Ware MRN: [id number] Date of Birth: [birth year] Referring Provider (OT): Dr. Drusilla Kanner   Encounter Date: 01/06/2019  OT End of Session - 01/06/19 1247    Visit Number  2    Number of Visits  16    Date for OT Re-Evaluation  02/26/19    Authorization Type  [employer]    Authorization - Visit Number  2    Authorization - Number of Visits  10    OT Start Time  1200    OT Stop Time  1245    OT Time Calculation (min)  45 min    Activity Tolerance  Patient tolerated treatment well    Behavior During Therapy  [facility] for tasks assessed/performed       Past Medical History:  Diagnosis Date  . Allergy   . Hyperlipidemia   . Hypertension   . Substance abuse [facility])     Past Surgical History:  Procedure Laterality Date  . INGUINAL HERNIA REPAIR Right 03/10/2015   Procedure: OPEN RIGHT INGUINAL HERNIA REPAIR;  Surgeon: Johnathan Hausen, MD;  Location: [facility];  Service: General;  Laterality: Right;  With MESH  . VASECTOMY      There were no vitals filed for this visit.  Subjective Assessment - 01/06/19 1209    Subjective   I've been wearing my brace pretty much all weekend    Pertinent History  Pain Rt wrist started months ago PMH: HTN    Special Tests  (+) Finklesteins for DeQuervains    Currently in Pain?  Yes    Pain Score  3     Pain Location  Wrist    Pain Orientation  Right    Pain Descriptors / Indicators  Sharp;Aching    Pain Type  Acute pain    Pain Frequency  Intermittent    Aggravating Factors   hitting it, certain movements    Pain Relieving Factors  OTC meds, splint                CLINIC OPERATION CHANGES: Outpatient Neuro Rehab is open at lower capacity following universal masking, social distancing, and patient  screening.  The patient's COVID risk of complications score is 3.     OT Treatments/Exercises (OP) - 01/06/19 0001      ADLs   ADL Comments  Discussed iontophoresis further in prep for next session (MD has approved and signed off) - pt issued handout on precautions/contraindications in prep for next session. Pt reports no contraindications to iontophoresis      Splinting   Splinting  Fabricated and fitted thumb spica splint and issued. Reviewed wear and care             OT Education - 01/06/19 1214    Education Details  splint wear and care, iontophoresis precautions/contraindications    Person(s) Educated  Patient    Methods  Explanation;Handout    Comprehension  Verbalized understanding       OT Short Term Goals - 12/27/18 1231      OT SHORT TERM GOAL #1   Title  Independent with initial HEP (including wrist and thumb A/ROM separate, and isometric wrist strengthening)    Time  4    Period  Weeks    Status  New      OT SHORT TERM  GOAL #2   Title  Independent with splint wear and care    Time  4    Period  Weeks    Status  New      OT SHORT TERM GOAL #3   Title  Pt to report decreased pain overall with task modifications/adaptations and A/E prn, as well as use of modalities, and splint prn    Time  4    Period  Weeks    Status  New        OT Long Term Goals - 12/27/18 1232      OT LONG TERM GOAL #1   Title  Pt to return to using Rt hand as assist/non dominant hand with pain consistently under 5/10    Baseline  up to 10/10    Time  8    Period  Weeks    Status  New      OT LONG TERM GOAL #2   Title  Rt hand 3 pt pinch to be 18 lbs or greater for opening wrappers, etc.    Time  8    Period  Weeks    Status  New      OT LONG TERM GOAL #3   Title  Indepndent with updated strengthening HEP    Time  8    Period  Weeks    Status  New            Plan - 01/06/19 1248    Clinical Impression Statement  Pt now can alternate b/t splint and brace  depending on task    Occupational performance deficits (Please refer to evaluation for details):  ADL's;IADL's;Work    Body Structure / Function / Physical Skills  ADL;ROM;IADL;Strength;Pain;UE functional use    Rehab Potential  Good    OT Frequency  2x / week    OT Duration  8 weeks    OT Treatment/Interventions  Self-care/ADL training;Therapeutic exercise;Splinting;Ultrasound;Therapeutic activities;Iontophoresis;Paraffin;Cryotherapy;DME and/or AE instruction;Compression bandaging;Fluidtherapy;Moist Heat;Contrast Bath;Passive range of motion;Patient/family education    Plan  assess/adjust splint prn, initial HEP, iontophoresis dose #1    Consulted and Agree with Plan of Care  Patient       Patient will benefit from skilled therapeutic intervention in order to improve the following deficits and impairments:   Body Structure / Function / Physical Skills: ADL, ROM, IADL, Strength, Pain, UE functional use       Visit Diagnosis: 1. Pain in right wrist       Problem List Patient Active Problem List   Diagnosis Date Noted  . De Quervain's tenosynovitis, right 10/07/2018  . Benign prostatic hyperplasia without lower urinary tract symptoms 01/18/2017  . Routine general medical examination at a health care facility 01/18/2017  . Essential hypertension 12/18/2014  . Hyperlipidemia with target LDL less than 100 12/18/2014  . First degree heart block 03/30/2013  . Family history of coronary artery disease 03/07/2013  . IMPAIRED FASTING GLUCOSE 06/30/2010  . GERD 06/26/2008  . ERECTILE DYSFUNCTION, MILD 01/30/2008  . VENTRICULAR HYPERTROPHY, LEFT 11/29/2006  . COLON POLYP 09/06/2006  . ASTHMA, EXERCISE INDUCED 09/06/2006    Carey Bullocks, OTR/L 01/06/2019, 12:49 PM  [facility] [address] [place], [place], [address] Phone: [phone]   Fax:  [phone]  Name: Clarence Ware MRN: [id number] Date of Birth:  [birth year]

## 2019-01-09 ENCOUNTER — Other Ambulatory Visit: Payer: Self-pay

## 2019-01-09 ENCOUNTER — Other Ambulatory Visit: Payer: Self-pay | Admitting: Cardiovascular Disease

## 2019-01-09 ENCOUNTER — Ambulatory Visit: Payer: PPO | Attending: Family Medicine | Admitting: Occupational Therapy

## 2019-01-09 DIAGNOSIS — M6281 Muscle weakness (generalized): Secondary | ICD-10-CM | POA: Diagnosis not present

## 2019-01-09 DIAGNOSIS — I10 Essential (primary) hypertension: Secondary | ICD-10-CM

## 2019-01-09 DIAGNOSIS — M25531 Pain in right wrist: Secondary | ICD-10-CM | POA: Insufficient documentation

## 2019-01-09 NOTE — Patient Instructions (Signed)
   Opposition (Active)   Touch tip of thumb to nail tip of each finger in turn, making an "O" shape. Repeat __10__ times, 2 sets. Do _3-4___ sessions per day.   MP Flexion (Active)   Bend thumb to touch base of little finger, keeping tip joint straight. Repeat __10__ times, 2 sets. Do _3-4___ sessions per day.         Composite Extension (Active)   Bring thumb up and out in hitchhiker position.  Repeat __10__ times, 2 sets. Do _3-4___ sessions per day.   Wrist Flexion / Extension in Pronation / Supination    Forearms on armrests, palms down, move Rt wrist up and down. Hold each position __3_ seconds. Repeat _10__ times, 2 sets. Do _3-4__ sessions per day.

## 2019-01-09 NOTE — Therapy (Signed)
[facility] [address] [place], [place], [address] Phone: [phone]   Fax:  [phone]  Occupational Therapy Treatment  Patient Details  Name: Clarence Ware MRN: [id number] Date of Birth: 1950-12-15 Referring Provider (OT): Dr. Drusilla Kanner   Encounter Date: 01/09/2019  OT End of Session - 01/09/19 0740    Visit Number  3    Number of Visits  16    Date for OT Re-Evaluation  02/26/19    Authorization Type  [employer]    Authorization - Visit Number  3    Authorization - Number of Visits  10    OT Start Time  0700    OT Stop Time  0750    OT Time Calculation (min)  50 min    Activity Tolerance  Patient tolerated treatment well    Behavior During Therapy  [facility] for tasks assessed/performed       Past Medical History:  Diagnosis Date  . Allergy   . Hyperlipidemia   . Hypertension   . Substance abuse [facility])     Past Surgical History:  Procedure Laterality Date  . INGUINAL HERNIA REPAIR Right 03/10/2015   Procedure: OPEN RIGHT INGUINAL HERNIA REPAIR;  Surgeon: Johnathan Hausen, MD;  [facility];  Service: General;  Laterality: Right;  With MESH  . VASECTOMY      There were no vitals filed for this visit.  Subjective Assessment - 01/09/19 0706    Subjective   The pain was about a 4/10 yesterday    Pertinent History  Pain Rt wrist started months ago PMH: HTN    Special Tests  (+) Finklesteins for DeQuervains    Currently in Pain?  No/denies   none today             CLINIC OPERATION CHANGES: Outpatient Neuro Rehab is open at lower capacity following universal masking, social distancing, and patient screening.  The patient's COVID risk of complications score is 3.      OT Treatments/Exercises (OP) - 01/09/19 0001      Exercises   Exercises  Wrist      Wrist Exercises   Other wrist exercises  Pt issued A/ROM for wrist (w/ thumb relaxed), and thumb with wrist neutral (see  pt instructions for details - pt performed each x 10 reps, 2 sets      Modalities   Modalities  Iontophoresis      Iontophoresis   Type of Iontophoresis  Dexamethasone    Location  radial base of thumb    Dose  2.2 mA, 1.5 cc    Time  x 18 min. with 5 min set up = 23 total time             OT Education - 01/09/19 0715    Education Details  A/ROM HEP    Person(s) Educated  Patient    Methods  Explanation;Demonstration;Handout    Comprehension  Verbalized understanding;Returned demonstration       OT Short Term Goals - 01/09/19 0755      OT SHORT TERM GOAL #1   Title  Independent with initial HEP (including wrist and thumb A/ROM separate, and isometric wrist strengthening)    Time  4    Period  Weeks    Status  On-going      OT SHORT TERM GOAL #2   Title  Independent with splint wear and care    Time  4    Period  Weeks  Status  Achieved      OT SHORT TERM GOAL #3   Title  Pt to report decreased pain overall with task modifications/adaptations and A/E prn, as well as use of modalities, and splint prn    Time  4    Period  Weeks    Status  On-going        OT Long Term Goals - 12/27/18 1232      OT LONG TERM GOAL #1   Title  Pt to return to using Rt hand as assist/non dominant hand with pain consistently under 5/10    Baseline  up to 10/10    Time  8    Period  Weeks    Status  New      OT LONG TERM GOAL #2   Title  Rt hand 3 pt pinch to be 18 lbs or greater for opening wrappers, etc.    Time  8    Period  Weeks    Status  New      OT LONG TERM GOAL #3   Title  Indepndent with updated strengthening HEP    Time  8    Period  Weeks    Status  New            Plan - 01/09/19 0750    Clinical Impression Statement  Pt progressing towards STG's    Occupational performance deficits (Please refer to evaluation for details):  ADL's;IADL's;Work    Body Structure / Function / Physical Skills  ADL;ROM;IADL;Strength;Pain;UE functional use    Rehab  Potential  Good    OT Frequency  2x / week    OT Duration  8 weeks    OT Treatment/Interventions  Self-care/ADL training;Therapeutic exercise;Splinting;Ultrasound;Therapeutic activities;Iontophoresis;Paraffin;Cryotherapy;DME and/or AE instruction;Compression bandaging;Fluidtherapy;Moist Heat;Contrast Bath;Passive range of motion;Patient/family education    Plan  ionto dose #2    Consulted and Agree with Plan of Care  Patient       Patient will benefit from skilled therapeutic intervention in order to improve the following deficits and impairments:   Body Structure / Function / Physical Skills: ADL, ROM, IADL, Strength, Pain, UE functional use       Visit Diagnosis: 1. Pain in right wrist   2. Muscle weakness (generalized)       Problem List Patient Active Problem List   Diagnosis Date Noted  . De Quervain's tenosynovitis, right 10/07/2018  . Benign prostatic hyperplasia without lower urinary tract symptoms 01/18/2017  . Routine general medical examination at a health care facility 01/18/2017  . Essential hypertension 12/18/2014  . Hyperlipidemia with target LDL less than 100 12/18/2014  . First degree heart block 03/30/2013  . Family history of coronary artery disease 03/07/2013  . IMPAIRED FASTING GLUCOSE 06/30/2010  . GERD 06/26/2008  . ERECTILE DYSFUNCTION, MILD 01/30/2008  . VENTRICULAR HYPERTROPHY, LEFT 11/29/2006  . COLON POLYP 09/06/2006  . ASTHMA, EXERCISE INDUCED 09/06/2006    Carey Bullocks, OTR/L 01/09/2019, 7:56 AM  [facility] [address] [place], [place], [address] Phone: [phone]   Fax:  [phone]  Name: Clarence Ware MRN: [id number] Date of Birth: 16-Aug-1950

## 2019-01-13 ENCOUNTER — Ambulatory Visit: Payer: PPO | Admitting: Occupational Therapy

## 2019-01-13 ENCOUNTER — Other Ambulatory Visit: Payer: Self-pay

## 2019-01-13 DIAGNOSIS — M25531 Pain in right wrist: Secondary | ICD-10-CM

## 2019-01-13 DIAGNOSIS — M6281 Muscle weakness (generalized): Secondary | ICD-10-CM

## 2019-01-13 NOTE — Therapy (Signed)
[facility] [address] [place], [place], [address] Phone: [phone]   Fax:  [phone]  Occupational Therapy Treatment  Patient Details  Name: Clarence Ware MRN: [id number] Date of Birth: 1950/09/05 Referring Provider (OT): Dr. Drusilla Kanner   Encounter Date: 01/13/2019  OT End of Session - 01/13/19 1159    Visit Number  4    Number of Visits  16    Date for OT Re-Evaluation  02/26/19    Authorization Type  [employer]    Authorization - Visit Number  4    Authorization - Number of Visits  10    OT Start Time  1155    OT Stop Time  1245    OT Time Calculation (min)  50 min    Activity Tolerance  Patient tolerated treatment well    Behavior During Therapy  [facility] for tasks assessed/performed       Past Medical History:  Diagnosis Date  . Allergy   . Hyperlipidemia   . Hypertension   . Substance abuse [facility])     Past Surgical History:  Procedure Laterality Date  . INGUINAL HERNIA REPAIR Right 03/10/2015   Procedure: OPEN RIGHT INGUINAL HERNIA REPAIR;  Surgeon: Johnathan Hausen, MD;  [facility];  Service: General;  Laterality: Right;  With MESH  . VASECTOMY      There were no vitals filed for this visit.  Subjective Assessment - 01/13/19 1157    Pertinent History  Pain Rt wrist started months ago PMH: HTN    Special Tests  (+) Finklesteins for DeQuervains    Currently in Pain?  Yes    Pain Score  4    Up to 9/10 if pt accidentally jams or hits it   Pain Location  Wrist    Pain Orientation  Right    Pain Descriptors / Indicators  Aching;Sharp    Pain Type  Acute pain    Pain Frequency  Intermittent    Aggravating Factors   hitting it, certain movements    Pain Relieving Factors  OTC meds, splint                CLINIC OPERATION CHANGES: Outpatient Neuro Rehab is open at lower capacity following universal masking, social distancing, and patient screening.  The patient's  COVID risk of complications score is 3.     OT Treatments/Exercises (OP) - 01/13/19 0001      ADLs   ADL Comments  Reviewed ways to hold pot/pan. Issued A/E handouts on key turners and light switch turners and instructed on how to purchase if needed.       Wrist Exercises   Other wrist exercises  Pt performed 2 sets of 10 reps for thumb ex's and wrist ex's (isolated)       Modalities   Modalities  Iontophoresis;Cryotherapy      Cryotherapy   Number Minutes Cryotherapy  5 Minutes    Cryotherapy Location  Wrist    Type of Cryotherapy  Ice massage      Iontophoresis   Type of Iontophoresis  Dexamethasone   dose #2   Location  radial base of thumb    Dose  2.2 mA, 1.5 cc    Time  x 18 min. with 5 min set up = 23 total time               OT Short Term Goals - 01/09/19 0755      OT SHORT TERM  GOAL #1   Title  Independent with initial HEP (including wrist and thumb A/ROM separate, and isometric wrist strengthening)    Time  4    Period  Weeks    Status  On-going      OT SHORT TERM GOAL #2   Title  Independent with splint wear and care    Time  4    Period  Weeks    Status  Achieved      OT SHORT TERM GOAL #3   Title  Pt to report decreased pain overall with task modifications/adaptations and A/E prn, as well as use of modalities, and splint prn    Time  4    Period  Weeks    Status  On-going        OT Long Term Goals - 12/27/18 1232      OT LONG TERM GOAL #1   Title  Pt to return to using Rt hand as assist/non dominant hand with pain consistently under 5/10    Baseline  up to 10/10    Time  8    Period  Weeks    Status  New      OT LONG TERM GOAL #2   Title  Rt hand 3 pt pinch to be 18 lbs or greater for opening wrappers, etc.    Time  8    Period  Weeks    Status  New      OT LONG TERM GOAL #3   Title  Indepndent with updated strengthening HEP    Time  8    Period  Weeks    Status  New            Plan - 01/13/19 1238    Clinical  Impression Statement  Pt gradually progressing. Pt reports he still has pain when brace not on.    Occupational performance deficits (Please refer to evaluation for details):  ADL's;IADL's;Work    Body Structure / Function / Physical Skills  ADL;ROM;IADL;Strength;Pain;UE functional use    Rehab Potential  Good    OT Frequency  2x / week    OT Duration  8 weeks    OT Treatment/Interventions  Self-care/ADL training;Therapeutic exercise;Splinting;Ultrasound;Therapeutic activities;Iontophoresis;Paraffin;Cryotherapy;DME and/or AE instruction;Compression bandaging;Fluidtherapy;Moist Heat;Contrast Bath;Passive range of motion;Patient/family education    Plan  ionto dose #3    Consulted and Agree with Plan of Care  Patient       Patient will benefit from skilled therapeutic intervention in order to improve the following deficits and impairments:   Body Structure / Function / Physical Skills: ADL, ROM, IADL, Strength, Pain, UE functional use       Visit Diagnosis: 1. Pain in right wrist   2. Muscle weakness (generalized)       Problem List Patient Active Problem List   Diagnosis Date Noted  . De Quervain's tenosynovitis, right 10/07/2018  . Benign prostatic hyperplasia without lower urinary tract symptoms 01/18/2017  . Routine general medical examination at a health care facility 01/18/2017  . Essential hypertension 12/18/2014  . Hyperlipidemia with target LDL less than 100 12/18/2014  . First degree heart block 03/30/2013  . Family history of coronary artery disease 03/07/2013  . IMPAIRED FASTING GLUCOSE 06/30/2010  . GERD 06/26/2008  . ERECTILE DYSFUNCTION, MILD 01/30/2008  . VENTRICULAR HYPERTROPHY, LEFT 11/29/2006  . COLON POLYP 09/06/2006  . ASTHMA, EXERCISE INDUCED 09/06/2006    Carey Bullocks, OTR/L 01/13/2019, 12:41 PM  [facility] [address]  [place], [place], [address] Phone: [phone]   Fax:   [phone]  Name: Clarence Ware MRN: [id number] Date of Birth: 28-Nov-1950

## 2019-01-16 ENCOUNTER — Ambulatory Visit: Payer: PPO | Admitting: Occupational Therapy

## 2019-01-21 ENCOUNTER — Ambulatory Visit: Payer: PPO | Admitting: Occupational Therapy

## 2019-01-21 ENCOUNTER — Other Ambulatory Visit: Payer: Self-pay

## 2019-01-21 DIAGNOSIS — M6281 Muscle weakness (generalized): Secondary | ICD-10-CM

## 2019-01-21 DIAGNOSIS — M25531 Pain in right wrist: Secondary | ICD-10-CM | POA: Diagnosis not present

## 2019-01-21 NOTE — Therapy (Addendum)
[facility] [address] [place], [place], [address] Phone: [phone]   Fax:  [phone]  Occupational Therapy Treatment  Patient Details  Name: Clarence Ware MRN: [id number] Date of Birth: [birth year] Referring Provider (OT): Dr. Drusilla Kanner   Encounter Date: 01/21/2019  OT End of Session - 01/21/19 0804    Visit Number  5   Number of Visits  16    Date for OT Re-Evaluation  02/26/19    Authorization Type  [employer]    Authorization - Visit Number  5   Authorization - Number of Visits  10    OT Start Time  0803    OT Stop Time  0845    OT Time Calculation (min)  42 min    Activity Tolerance  Patient tolerated treatment well    Behavior During Therapy  [facility] for tasks assessed/performed       Past Medical History:  Diagnosis Date  . Allergy   . Hyperlipidemia   . Hypertension   . Substance abuse [facility])     Past Surgical History:  Procedure Laterality Date  . INGUINAL HERNIA REPAIR Right 03/10/2015   Procedure: OPEN RIGHT INGUINAL HERNIA REPAIR;  Surgeon: Johnathan Hausen, MD;  Location: [facility];  Service: General;  Laterality: Right;  With MESH  . VASECTOMY      There were no vitals filed for this visit.  Subjective Assessment - 01/21/19 0804    Subjective   Pt reports the ionto treatment helped    Pertinent History  Pain Rt wrist started months ago PMH: HTN    Special Tests  (+) Finklesteins for DeQuervains    Currently in Pain?  Yes    Pain Score  3                  CLINIC OPERATION CHANGES: Outpatient Neuro Rehab is open at lower capacity following universal masking, social distancing, and patient screening.  The patient's COVID risk of complications score is 3.     OT Treatments/Exercises (OP)               Wrist Exercises   Other wrist exercises  Pt performed  thumb ex's and wrist ex's (isolated) , min v.c     Modalities   Modalities   Iontophoresis;Cryotherapy      Cryotherapy   Number Minutes Cryotherapy 8 Minutes    Cryotherapy Location  Wrist    Type of Cryotherapy  Ice pack, while therapist prepared ionto     Iontophoresis   Type of Iontophoresis  Dexamethasone   dose #2   Location  radial base of thumb    Dose  2.2 mA, 1.5 cc    Time  x 18 min. with 5 min set up = 23 total time , mild pink coloration at medication site, pt in no distress                    OT Short Term Goals - 01/09/19 0755      OT SHORT TERM GOAL #1   Title  Independent with initial HEP (including wrist and thumb A/ROM separate, and isometric wrist strengthening)    Time  4    Period  Weeks    Status  On-going      OT SHORT TERM GOAL #2   Title  Independent with splint wear and care    Time  4    Period  Weeks    Status  Achieved      OT SHORT TERM GOAL #3   Title  Pt to report decreased pain overall with task modifications/adaptations and A/E prn, as well as use of modalities, and splint prn    Time  4    Period  Weeks    Status  On-going        OT Long Term Goals - 12/27/18 1232      OT LONG TERM GOAL #1   Title  Pt to return to using Rt hand as assist/non dominant hand with pain consistently under 5/10    Baseline  up to 10/10    Time  8    Period  Weeks    Status  New      OT LONG TERM GOAL #2   Title  Rt hand 3 pt pinch to be 18 lbs or greater for opening wrappers, etc.    Time  8    Period  Weeks    Status  New      OT LONG TERM GOAL #3   Title  Indepndent with updated strengthening HEP    Time  8    Period  Weeks    Status  New            Plan - 01/21/19 0827    Clinical Impression Statement  Pt gradually progressing. Pt reports his felt better for several days after last itonto treatment    Occupational performance deficits (Please refer to evaluation for details):  ADL's;IADL's;Work    Body Structure / Function / Physical Skills  ADL;ROM;IADL;Strength;Pain;UE functional use     Rehab Potential  Good    OT Frequency  2x / week    OT Duration  8 weeks    OT Treatment/Interventions  Self-care/ADL training;Therapeutic exercise;Splinting;Ultrasound;Therapeutic activities;Iontophoresis;Paraffin;Cryotherapy;DME and/or AE instruction;Compression bandaging;Fluidtherapy;Moist Heat;Contrast Bath;Passive range of motion;Patient/family education    Plan  ionto dose #4, gentle exercises, ice    Consulted and Agree with Plan of Care  Patient       Patient will benefit from skilled therapeutic intervention in order to improve the following deficits and impairments:   Body Structure / Function / Physical Skills: ADL, ROM, IADL, Strength, Pain, UE functional use       Visit Diagnosis: 1. Pain in right wrist   2. Muscle weakness (generalized)       Problem List Patient Active Problem List   Diagnosis Date Noted  . De Quervain's tenosynovitis, right 10/07/2018  . Benign prostatic hyperplasia without lower urinary tract symptoms 01/18/2017  . Routine general medical examination at a health care facility 01/18/2017  . Essential hypertension 12/18/2014  . Hyperlipidemia with target LDL less than 100 12/18/2014  . First degree heart block 03/30/2013  . Family history of coronary artery disease 03/07/2013  . IMPAIRED FASTING GLUCOSE 06/30/2010  . GERD 06/26/2008  . ERECTILE DYSFUNCTION, MILD 01/30/2008  . VENTRICULAR HYPERTROPHY, LEFT 11/29/2006  . COLON POLYP 09/06/2006  . ASTHMA, EXERCISE INDUCED 09/06/2006    Aryia Delira 01/21/2019, 8:29 AM Theone Murdoch, OTR/L [phone] Phone: [phone] 8:34 AM 01/21/19 [facility] [address] [place], [place], [address] Phone: [phone]   Fax:  [phone]  Name: Clarence Ware MRN: [id number] Date of Birth: [birth year]

## 2019-01-22 ENCOUNTER — Other Ambulatory Visit: Payer: Self-pay | Admitting: Cardiovascular Disease

## 2019-01-22 DIAGNOSIS — E785 Hyperlipidemia, unspecified: Secondary | ICD-10-CM

## 2019-01-24 ENCOUNTER — Other Ambulatory Visit: Payer: Self-pay

## 2019-01-24 ENCOUNTER — Ambulatory Visit: Payer: PPO | Admitting: Occupational Therapy

## 2019-01-24 DIAGNOSIS — M6281 Muscle weakness (generalized): Secondary | ICD-10-CM

## 2019-01-24 DIAGNOSIS — M25531 Pain in right wrist: Secondary | ICD-10-CM

## 2019-01-24 NOTE — Therapy (Signed)
[facility] [address] [place], [place], [address] Phone: [phone]   Fax:  [phone]  Occupational Therapy Treatment  Patient Details  Name: Clarence Ware MRN: [id number] Date of Birth: [birth year] Referring Provider (OT): Dr. Drusilla Kanner   Encounter Date: 01/24/2019  OT End of Session - 01/24/19 0843    Visit Number  6   corrected count   Number of Visits  16    Date for OT Re-Evaluation  02/26/19    Authorization Type  [employer]    Authorization - Visit Number  6   corrected count   Authorization - Number of Visits  10    OT Start Time  0805    OT Stop Time  0849    OT Time Calculation (min)  44 min    Activity Tolerance  Patient tolerated treatment well    Behavior During Therapy  [facility] for tasks assessed/performed       Past Medical History:  Diagnosis Date  . Allergy   . Hyperlipidemia   . Hypertension   . Substance abuse [facility])     Past Surgical History:  Procedure Laterality Date  . INGUINAL HERNIA REPAIR Right 03/10/2015   Procedure: OPEN RIGHT INGUINAL HERNIA REPAIR;  Surgeon: Johnathan Hausen, MD;  Location: [facility];  Service: General;  Laterality: Right;  With MESH  . VASECTOMY      There were no vitals filed for this visit.  Subjective Assessment - 01/24/19 0842    Subjective   Pt reports the second ionto treatment helped    Pertinent History  Pain Rt wrist started months ago PMH: HTN    Special Tests  (+) Finklesteins for DeQuervains    Currently in Pain?  Yes    Pain Score  2     Pain Location  Wrist    Pain Orientation  Right    Pain Descriptors / Indicators  Aching    Pain Type  Acute pain    Pain Onset  More than a month ago    Pain Frequency  Intermittent    Aggravating Factors   not wearing brace    Pain Relieving Factors  ice              CLINIC OPERATION CHANGES: Outpatient [facility] Re[facility] is open at lower capacity following universal masking,  social distancing, and patient screening.  The patient's COVID risk of complications score is 3.     OT Treatments/Exercises (OP)               Wrist Exercises   Other wrist exercises  Pt performed  thumb ex's and wrist ex's (isolated) , min v.c     Modalities   Modalities  Iontophoresis;Cryotherapy, [place]  [place] 3MHZ, 0.8 w/cm 2, 20%, x 8 mins for pain relief, no adverse reactions.     Cryotherapy   Number Minutes Cryotherapy 5Minutes    Cryotherapy Location  Wrist    Type of Cryotherapy  Ice pack, while therapist prepared ionto     Iontophoresis   Type of Iontophoresis  Dexamethasone   dose #2   Location  radial base of thumb    Dose  2.2 mA, 1.5 cc    Time  x 18 min. with 5 min set up = 23 total time , mild pink coloration at medication site, 1 small water blister present pt in no distress, lotion applied  OT Short Term Goals - 01/09/19 0755      OT SHORT TERM GOAL #1   Title  Independent with initial HEP (including wrist and thumb A/ROM separate, and isometric wrist strengthening)    Time  4    Period  Weeks    Status  On-going      OT SHORT TERM GOAL #2   Title  Independent with splint wear and care    Time  4    Period  Weeks    Status  Achieved      OT SHORT TERM GOAL #3   Title  Pt to report decreased pain overall with task modifications/adaptations and A/E prn, as well as use of modalities, and splint prn    Time  4    Period  Weeks    Status  On-going        OT Long Term Goals - 12/27/18 1232      OT LONG TERM GOAL #1   Title  Pt to return to using Rt hand as assist/non dominant hand with pain consistently under 5/10    Baseline  up to 10/10    Time  8    Period  Weeks    Status  New      OT LONG TERM GOAL #2   Title  Rt hand 3 pt pinch to be 18 lbs or greater for opening wrappers, etc.    Time  8    Period  Weeks    Status  New      OT LONG TERM GOAL #3   Title  Indepndent with updated strengthening HEP     Time  8    Period  Weeks    Status  New            Plan - 01/24/19 0844    Clinical Impression Statement  Pt gradually progressing. Pt reports that his pain is decreased overall.    Occupational performance deficits (Please refer to evaluation for details):  ADL's;IADL's;Work    Body Structure / Function / Physical Skills  ADL;ROM;IADL;Strength;Pain;UE functional use    Rehab Potential  Good    OT Frequency  2x / week    OT Duration  8 weeks    OT Treatment/Interventions  Self-care/ADL training;Therapeutic exercise;Splinting;Ultrasound;Therapeutic activities;Iontophoresis;Paraffin;Cryotherapy;DME and/or AE instruction;Compression bandaging;Fluidtherapy;Moist Heat;Contrast Bath;Passive range of motion;Patient/family education    Plan  ionto dose #5, gentle exercises, ice    Consulted and Agree with Plan of Care  Patient       Patient will benefit from skilled therapeutic intervention in order to improve the following deficits and impairments:   Body Structure / Function / Physical Skills: ADL, ROM, IADL, Strength, Pain, UE functional use       Visit Diagnosis: 1. Pain in right wrist   2. Muscle weakness (generalized)       Problem List Patient Active Problem List   Diagnosis Date Noted  . De Quervain's tenosynovitis, right 10/07/2018  . Benign prostatic hyperplasia without lower urinary tract symptoms 01/18/2017  . Routine general medical examination at a health care facility 01/18/2017  . Essential hypertension 12/18/2014  . Hyperlipidemia with target LDL less than 100 12/18/2014  . First degree heart block 03/30/2013  . Family history of coronary artery disease 03/07/2013  . IMPAIRED FASTING GLUCOSE 06/30/2010  . GERD 06/26/2008  . ERECTILE DYSFUNCTION, MILD 01/30/2008  . VENTRICULAR HYPERTROPHY, LEFT 11/29/2006  . COLON POLYP 09/06/2006  . ASTHMA, EXERCISE INDUCED 09/06/2006    ,  01/24/2019, 4:21 PM Theone Murdoch, OTR/L [phone]  [phone] Phone: [phone] 4:23 PM 01/24/19 [facility] [address] [place], [place], [address] Phone: [phone]   Fax:  [phone]  Name: Clarence Ware MRN: [id number] Date of Birth: [birth year]

## 2019-01-29 ENCOUNTER — Ambulatory Visit: Payer: PPO | Admitting: Occupational Therapy

## 2019-01-29 ENCOUNTER — Other Ambulatory Visit: Payer: Self-pay

## 2019-01-29 DIAGNOSIS — M6281 Muscle weakness (generalized): Secondary | ICD-10-CM

## 2019-01-29 DIAGNOSIS — M25531 Pain in right wrist: Secondary | ICD-10-CM | POA: Diagnosis not present

## 2019-01-29 NOTE — Therapy (Signed)
[facility] [address] [place], [place], [address] Phone: [phone]   Fax:  [phone]  Occupational Therapy Treatment  Patient Details  Name: Clarence Ware MRN: [id number] Date of Birth: [birth year] Referring Provider (OT): Dr. Drusilla Kanner   Encounter Date: 01/29/2019  OT End of Session - 01/29/19 1841    Visit Number  7   corrected count   Number of Visits  16    Date for OT Re-Evaluation  02/26/19    Authorization Type  [employer]    Authorization - Visit Number  7   corrected count   Authorization - Number of Visits  10    OT Start Time  1800    OT Stop Time  1844    OT Time Calculation (min)  44 min    Activity Tolerance  Patient tolerated treatment well    Behavior During Therapy  [facility] for tasks assessed/performed       Past Medical History:  Diagnosis Date  . Allergy   . Hyperlipidemia   . Hypertension   . Substance abuse [facility])     Past Surgical History:  Procedure Laterality Date  . INGUINAL HERNIA REPAIR Right 03/10/2015   Procedure: OPEN RIGHT INGUINAL HERNIA REPAIR;  Surgeon: Johnathan Hausen, MD;  Location: [facility];  Service: General;  Laterality: Right;  With MESH  . VASECTOMY      There were no vitals filed for this visit.  Subjective Assessment - 01/29/19 1834    Subjective   Pt reports pain was better until he worked without brace    Pertinent History  Pain Rt wrist started months ago PMH: HTN    Special Tests  (+) Finklesteins for DeQuervains    Pain Score  4     Pain Location  Wrist    Pain Orientation  Right    Pain Descriptors / Indicators  Aching    Pain Type  Acute pain    Pain Onset  More than a month ago    Pain Frequency  Intermittent    Aggravating Factors   overuse    Pain Relieving Factors  rest               CLINIC OPERATION CHANGES: Outpatient Neuro Rehab is open at lower capacity following universal masking, social distancing, and  patient screening.  The patient's COVID risk of complications score is 3.     OT Treatments/Exercises (OP)               Wrist Exercises   Other wrist exercises  Pt performed  thumb ex's and wrist ex's (isolated) , min v.c     Modalities   Modalities  Iontophoresis;Cryotherapy, [place]  [place] 3MHZ, 0.8 w/cm 2, 20%, x 8 mins for pain relief, no adverse reactions.     Cryotherapy   Number Minutes Cryotherapy 5 Minutes    Cryotherapy Location  Wrist    Type of Cryotherapy  Ice pack, while therapist prepared ionto     Iontophoresis   Type of Iontophoresis  Dexamethasone      Location  radial base of thumb    Dose  2.2 mA, 1.5 cc    Time  x 18 min. with 5 min set up = 23 total time , mild pink coloration at medication site, 1 small water blister present pt in no distress,                   OT Short Term  Goals - 01/09/19 0755      OT SHORT TERM GOAL #1   Title  Independent with initial HEP (including wrist and thumb A/ROM separate, and isometric wrist strengthening)    Time  4    Period  Weeks    Status  On-going      OT SHORT TERM GOAL #2   Title  Independent with splint wear and care    Time  4    Period  Weeks    Status  Achieved      OT SHORT TERM GOAL #3   Title  Pt to report decreased pain overall with task modifications/adaptations and A/E prn, as well as use of modalities, and splint prn    Time  4    Period  Weeks    Status  On-going        OT Long Term Goals - 12/27/18 1232      OT LONG TERM GOAL #1   Title  Pt to return to using Rt hand as assist/non dominant hand with pain consistently under 5/10    Baseline  up to 10/10    Time  8    Period  Weeks    Status  New      OT LONG TERM GOAL #2   Title  Rt hand 3 pt pinch to be 18 lbs or greater for opening wrappers, etc.    Time  8    Period  Weeks    Status  New      OT LONG TERM GOAL #3   Title  Indepndent with updated strengthening HEP    Time  8    Period  Weeks    Status  New             Plan - 01/29/19 1835    Clinical Impression Statement  Pt gradually progressing. Pt reports that his pain is decreased overall however it is still aggravated with work tasks when his brace is off.    Occupational performance deficits (Please refer to evaluation for details):  ADL's;IADL's;Work    Body Structure / Function / Physical Skills  ADL;ROM;IADL;Strength;Pain;UE functional use    Rehab Potential  Good    OT Frequency  2x / week    OT Duration  8 weeks    OT Treatment/Interventions  Self-care/ADL training;Therapeutic exercise;Splinting;Ultrasound;Therapeutic activities;Iontophoresis;Paraffin;Cryotherapy;DME and/or AE instruction;Compression bandaging;Fluidtherapy;Moist Heat;Contrast Bath;Passive range of motion;Patient/family education    Plan  ionto dose #6  gentle exercises, ice    Consulted and Agree with Plan of Care  Patient       Patient will benefit from skilled therapeutic intervention in order to improve the following deficits and impairments:   Body Structure / Function / Physical Skills: ADL, ROM, IADL, Strength, Pain, UE functional use       Visit Diagnosis: 1. Muscle weakness (generalized)   2. Pain in right wrist       Problem List Patient Active Problem List   Diagnosis Date Noted  . De Quervain's tenosynovitis, right 10/07/2018  . Benign prostatic hyperplasia without lower urinary tract symptoms 01/18/2017  . Routine general medical examination at a health care facility 01/18/2017  . Essential hypertension 12/18/2014  . Hyperlipidemia with target LDL less than 100 12/18/2014  . First degree heart block 03/30/2013  . Family history of coronary artery disease 03/07/2013  . IMPAIRED FASTING GLUCOSE 06/30/2010  . GERD 06/26/2008  . ERECTILE DYSFUNCTION, MILD 01/30/2008  . VENTRICULAR HYPERTROPHY, LEFT 11/29/2006  . COLON POLYP  09/06/2006  . ASTHMA, EXERCISE INDUCED 09/06/2006    RINE,KATHRYN 01/29/2019, 6:59 PM Theone Murdoch,  OTR/L Fax:(336) [phone] Phone: [phone] 7:02 PM 01/29/19 [facility] [address] [place], [place], [address] Phone: [phone]   Fax:  [phone]  Name: Clarence Ware MRN: [id number] Date of Birth: Nov 20, 1950

## 2019-01-31 ENCOUNTER — Ambulatory Visit: Payer: PPO | Admitting: Occupational Therapy

## 2019-01-31 ENCOUNTER — Other Ambulatory Visit: Payer: Self-pay

## 2019-01-31 DIAGNOSIS — M25531 Pain in right wrist: Secondary | ICD-10-CM

## 2019-01-31 DIAGNOSIS — M6281 Muscle weakness (generalized): Secondary | ICD-10-CM

## 2019-01-31 NOTE — Therapy (Signed)
[facility] [address] [place], [place], [address] Phone: [phone]   Fax:  [phone]  Occupational Therapy Treatment  Patient Details  Name: Clarence Ware MRN: [id number] Date of Birth: 08-22-1950 Referring Provider (OT): Dr. Drusilla Ware   Encounter Date: 01/31/2019  OT End of Session - 01/31/19 0844    Visit Number  8   corrected count   Number of Visits  16    Date for OT Re-Evaluation  02/26/19    Authorization Type  [employer]    Authorization - Visit Number  8   corrected count   Authorization - Number of Visits  10    OT Start Time  0805    OT Stop Time  0845    OT Time Calculation (min)  40 min    Activity Tolerance  Patient tolerated treatment well    Behavior During Therapy  [facility] for tasks assessed/performed       Past Medical History:  Diagnosis Date  . Allergy   . Hyperlipidemia   . Hypertension   . Substance abuse [facility])     Past Surgical History:  Procedure Laterality Date  . INGUINAL HERNIA REPAIR Right 03/10/2015   Procedure: OPEN RIGHT INGUINAL HERNIA REPAIR;  Surgeon: Johnathan Hausen, MD;  Location: [facility];  Service: General;  Laterality: Right;  With MESH  . VASECTOMY      There were no vitals filed for this visit.  Subjective Assessment - 01/31/19 0834    Subjective   Pt reports continued mild pain, he had several long work days this week which may have aggravated his wrist pain    Currently in Pain?  Yes    Pain Score  3     Pain Location  Wrist    Pain Orientation  Right    Pain Descriptors / Indicators  Aching    Pain Type  Acute pain    Pain Onset  More than a month ago    Pain Frequency  Intermittent    Aggravating Factors   overuse, not wearing brace    Pain Relieving Factors  ice, rest    Multiple Pain Sites  No            CLINIC OPERATION CHANGES: Outpatient Neuro Rehab is open at lower capacity following universal masking, social  distancing, and patient screening.  The patient's COVID risk of complications score is 3. Pt arrived today wearing his custom thumb spica. Pt has one small scab at wrist from water blister from last treatment. Note sent with patient to take to MD appointment. See pt instructions.    OT Treatments/Exercises (OP)               Wrist Exercises   Other wrist exercises  Pt performed  thumb ex's and wrist ex's (isolated) , 2 sets of 10 reps     Modalities   Modalities  Iontophoresis;Cryotherapy, [place]  [place] 3MHZ, 0.8 w/cm 2, 20%, x 8 mins for pain relief, no adverse reactions.     Cryotherapy   Number Minutes Cryotherapy 5 Minutes    Cryotherapy Location  Wrist    Type of Cryotherapy  Ice pack, while therapist prepared ionto     Iontophoresis   Type of Iontophoresis  Dexamethasone      Location  radial base of thumb    Dose  2.2 mA, 1.5 cc    Time  x 18 min. with 5 min set up = 23 total  time , mild pink coloration at medication site, redness around small scab at wrist,  pt was in no distress,                         OT Short Term Goals - 01/09/19 0755      OT SHORT TERM GOAL #1   Title  Independent with initial HEP (including wrist and thumb A/ROM separate, and isometric wrist strengthening)    Time  4    Period  Weeks    Status  On-going -met for A/ROM exercises     OT SHORT TERM GOAL #2   Title  Independent with splint wear and care    Time  4    Period  Weeks    Status  Achieved      OT SHORT TERM GOAL #3   Title  Pt to report decreased pain overall with task modifications/adaptations and A/E prn, as well as use of modalities, and splint prn    Time  4    Period  Weeks    Status  On-going        OT Long Term Goals - 12/27/18 1232      OT LONG TERM GOAL #1   Title  Pt to return to using Rt hand as assist/non dominant hand with pain consistently under 5/10    Baseline  up to 10/10    Time  8    Period  Weeks    Status  New      OT LONG TERM GOAL  #2   Title  Rt hand 3 pt pinch to be 18 lbs or greater for opening wrappers, etc.    Time  8    Period  Weeks    Status  New      OT LONG TERM GOAL #3   Title  Indepndent with updated strengthening HEP    Time  8    Period  Weeks    Status  New            Plan - 01/31/19 0845    Clinical Impression Statement  Pt is progressing overall. He continues to experience mild pain in his right wrist, which worsens with repetative use and typing.    Occupational performance deficits (Please refer to evaluation for details):  ADL's;IADL's;Work    Body Structure / Function / Physical Skills  ADL;ROM;IADL;Strength;Pain;UE functional use    Rehab Potential  Good    OT Frequency  2x / week    OT Duration  8 weeks    OT Treatment/Interventions  Self-care/ADL training;Therapeutic exercise;Splinting;Ultrasound;Therapeutic activities;Iontophoresis;Paraffin;Cryotherapy;DME and/or AE instruction;Compression bandaging;Fluidtherapy;Moist Heat;Contrast Bath;Passive range of motion;Patient/family education    Plan  continue [place], ice, issue isometric exercises,  proceed as per MD recommendations    Consulted and Agree with Plan of Care  Patient       Patient will benefit from skilled therapeutic intervention in order to improve the following deficits and impairments:   Body Structure / Function / Physical Skills: ADL, ROM, IADL, Strength, Pain, UE functional use       Visit Diagnosis: 1. Muscle weakness (generalized)   2. Pain in right wrist       Problem List Patient Active Problem List   Diagnosis Date Noted  . De Quervain's tenosynovitis, right 10/07/2018  . Benign prostatic hyperplasia without lower urinary tract symptoms 01/18/2017  . Routine general medical examination at a health care facility 01/18/2017  . Essential hypertension 12/18/2014  .  Hyperlipidemia with target LDL less than 100 12/18/2014  . First degree heart block 03/30/2013  . Family history of coronary artery disease  03/07/2013  . IMPAIRED FASTING GLUCOSE 06/30/2010  . GERD 06/26/2008  . ERECTILE DYSFUNCTION, MILD 01/30/2008  . VENTRICULAR HYPERTROPHY, LEFT 11/29/2006  . COLON POLYP 09/06/2006  . ASTHMA, EXERCISE INDUCED 09/06/2006    RINE,KATHRYN 01/31/2019, 8:48 AM Theone Murdoch, OTR/L [phone] Phone: [phone] 9:17 AM 01/31/19 [facility] [address] [place], [place], [address] Phone: [phone]   Fax:  [phone]  Name: Clarence Ware MRN: [id number] Date of Birth: 07-16-1950

## 2019-01-31 NOTE — Patient Instructions (Signed)
Dr. Tamala Julian,   Clarence Ware has been receiving occupational therapy at our site. He has received 6 treatments of iontophoresis to his right wrist. He has been educated in positioning to minimize pain, and HEP. He continues to wear his custom thumb spica splint during provoking activities. He continues to experience pain grossly 3/10 however it can be greater if he has a long work day which includes typing. Please feel free to contact me with any questions. Please provide any new recommendations.   Sincerely,   Theone Murdoch, OTR/L

## 2019-02-04 ENCOUNTER — Encounter: Payer: Self-pay | Admitting: Family Medicine

## 2019-02-04 ENCOUNTER — Ambulatory Visit: Payer: Self-pay

## 2019-02-04 ENCOUNTER — Ambulatory Visit: Payer: PPO | Admitting: Occupational Therapy

## 2019-02-04 ENCOUNTER — Ambulatory Visit: Payer: PPO | Admitting: Family Medicine

## 2019-02-04 ENCOUNTER — Other Ambulatory Visit: Payer: Self-pay

## 2019-02-04 VITALS — BP 118/74 | HR 69 | Ht 75.0 in | Wt 216.0 lb

## 2019-02-04 DIAGNOSIS — M25531 Pain in right wrist: Secondary | ICD-10-CM | POA: Diagnosis not present

## 2019-02-04 DIAGNOSIS — M654 Radial styloid tenosynovitis [de Quervain]: Secondary | ICD-10-CM

## 2019-02-04 DIAGNOSIS — M87039 Idiopathic aseptic necrosis of unspecified carpus: Secondary | ICD-10-CM | POA: Diagnosis not present

## 2019-02-04 DIAGNOSIS — M6281 Muscle weakness (generalized): Secondary | ICD-10-CM

## 2019-02-04 NOTE — Progress Notes (Signed)
[facility] [place],  [place] [address] Phone: [phone] Subjective:   [name], am serving as a scribe for Dr. Hulan Saas.  I'm seeing this patient by the request  of:    CC:   IDP:[id number]   12/23/2018: Patient is making some improvement.  Ultrasound does show that patient has some healing of the tendon.  We discussed different treatment options including formal physical therapy or occupational therapy.  Patient will be referred today.  Continue the nitroglycerin at the same dosing.  In addition to this we discussed the possibility of an MRI for further evaluation which patient as well as I felt was not necessary at this point with patient making some improvement.  Follow-up again in 6 weeks  Update 02/04/2019: Clarence Ware is a 68 y.o. male coming in with complaint of right wrist pain. Patient states that he is doing somewhat better but still having pain. Pain with extension mostly. Is wearing brace throughout day.  Patient states that he has never without some type of discomfort and pain.    Past Medical History:  Diagnosis Date  . Allergy   . Hyperlipidemia   . Hypertension   . Substance abuse [facility])    Past Surgical History:  Procedure Laterality Date  . INGUINAL HERNIA REPAIR Right 03/10/2015   Procedure: OPEN RIGHT INGUINAL HERNIA REPAIR;  Surgeon: Johnathan Hausen, MD;  Location: [facility];  Service: General;  Laterality: Right;  With MESH  . VASECTOMY     Social History   Socioeconomic History  . Marital status: Married    Spouse name: Not on file  . Number of children: 2  . Years of education: Not on file  . Highest education level: Not on file  Occupational History  . Occupation: Marine scientist: [employer]  . Financial resource strain: Not hard at all  . Food insecurity    Worry: Never true    Inability: Never true  . Transportation needs    Medical: No    Non-medical: No  Tobacco  Use  . Smoking status: Former Smoker    Packs/day: 0.25    Years: 1.00    Pack years: 0.25    Quit date: 07/10/1992    Years since quitting: 26.5  . Smokeless tobacco: Never Used  Substance and Sexual Activity  . Alcohol use: No    Alcohol/week: 0.0 standard drinks  . Drug use: No  . Sexual activity: Yes  Lifestyle  . Physical activity    Days per week: 3 days    Minutes per session: 40 min  . Stress: Not at all  Relationships  . Social connections    Talks on phone: More than three times a week    Gets together: More than three times a week    Attends religious service: More than 4 times per year    Active member of club or organization: Yes    Attends meetings of clubs or organizations: More than 4 times per year    Relationship status: Married  Other Topics Concern  . Not on file  Social History Narrative   Married. Education: [employer].   Exercise: some   Allergies  Allergen Reactions  . Nexium [Esomeprazole Magnesium]     Awful regurgitation   Family History  Problem Relation Age of Onset  . Heart disease Mother   . Stroke Mother   . Heart disease Father 36  MI  . Hypertension Father   . Hypertension Brother   . Diabetes Brother   . Heart disease Brother   . Hyperlipidemia Brother   . Cancer Brother   . Hyperlipidemia Sister   . Coronary artery disease Brother 31       CABG  . Colon cancer Paternal Grandmother      Current Outpatient Medications (Cardiovascular):  .  atorvastatin (LIPITOR) 40 MG tablet, TAKE ONE TABLET BY MOUTH DAILY .  ezetimibe (ZETIA) 10 MG tablet, TAKE ONE TABLET BY MOUTH DAILY .  losartan (COZAAR) 25 MG tablet, TAKE ONE TABLET BY MOUTH DAILY .  metoprolol succinate (TOPROL-XL) 25 MG 24 hr tablet, TAKE ONE-HALF TABLET (12.5MG ) BY MOUTH DAILY .  nitroGLYCERIN (NITRODUR - DOSED IN MG/24 HR) 0.2 mg/hr patch, 1/4 patch daily .  tadalafil (CIALIS) 20 MG tablet, Use as directed.  Current Outpatient Medications (Respiratory):  .   cetirizine (ZYRTEC) 10 MG tablet, Take 10 mg by mouth as directed.   Current Outpatient Medications (Analgesics):  .  aspirin EC 81 MG tablet, Take 81 mg by mouth daily.   Current Outpatient Medications (Other):  Marland Kitchen  Cholecalciferol (VITAMIN D) 2000 units CAPS, Take 1 capsule by mouth daily. .  Diclofenac Sodium 2 % SOLN, Place 2 g onto the skin 2 (two) times daily. .  Multiple Vitamin (MULTI-VITAMIN PO), Take 1 tablet by mouth.    Past medical history, social, surgical and family history all reviewed in electronic medical record.  No pertanent information unless stated regarding to the chief complaint.   Review of Systems:  No headache, visual changes, nausea, vomiting, diarrhea, constipation, dizziness, abdominal pain, skin rash, fevers, chills, night sweats, weight loss, swollen lymph nodes, body aches, joint swelling, negative chest pain, shortness of breath, mood changes.  Positive muscle aches Objective  Blood pressure 118/74, pulse 69, height 6\' 3"  (1.905 m), weight 216 lb (98 kg), SpO2 97 %.    General: No apparent distress alert and oriented x3 mood and affect normal, dressed appropriately.  HEENT: Pupils equal, extraocular movements intact  Respiratory: Patient's speak in full sentences and does not appear short of breath  Cardiovascular: No lower extremity edema, non tender, no erythema  Skin: Warm dry intact with no signs of infection or rash on extremities or on axial skeleton.  Abdomen: Soft nontender  Neuro: Cranial nerves II through XII are intact, neurovascularly intact in all extremities with 2+ DTRs and 2+ pulses.  Lymph: No lymphadenopathy of posterior or anterior cervical chain or axillae bilaterally.  Gait normal with good balance and coordination.  MSK:  Non tender with full range of motion and good stability and symmetric strength and tone of shoulders, elbows,  hip, knee and ankles bilaterally.  Wrist: Right Inspection patient does have more fullness noted over  the radial aspect of the wrist from previous exam ROM smooth positive Finkelstein's. Palpation is normal over metacarpals, navicular, lunate, and TFCC; tendons without tenderness/ swelling No snuffbox tenderness. No tenderness over Canal of Guyon. Strength 5/5 in all directions without pain. Negative  tinel's and phalens. Negative Watson's test. Contralateral wrist unremarkable  MSK US performed of: Right wrist This study was ordered, performed, and interpreted by Charlann Boxer D.O.  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  In addition to this there seems to be an underlying cortical irregularity now of the distal radius.  This is somewhat different from previous exam.  Patient also has increasing vascularity in the area that is quite significant does not appear to be neovascularization.  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 The above documentation has been reviewed and is accurate and complete Lyndal Pulley, DO       Note: This dictation was prepared with Dragon dictation along with smaller phrase technology. Any transcriptional errors that result from this process are unintentional.

## 2019-02-04 NOTE — Assessment & Plan Note (Signed)
De Quervain's tenosynovitis.  Patient has healed well.  Patient's ultrasound today shows that there is an increase in abnormal vascularity in the surrounding area and potentially a small cortical defect.  X-rays I believe at this time we do need an MRI to further evaluate.  Rule out avascular necrosis of the scaphoid underlying, distal radial fracture.  Also see if there is a nonhealing tear of the abductor pollicis longus tendon.  After the imaging we will discuss further treatment options including the possibility of PRP or surgical intervention.  Spent  25 minutes with patient face-to-face and had greater than 50% of counseling including as described above in assessment and plan.

## 2019-02-04 NOTE — Therapy (Signed)
[facility] [address] [place], [place], [address] Phone: [phone]   Fax:  [phone]  Occupational Therapy Treatment  Patient Details  Name: Clarence Ware MRN: [id number] Date of Birth: 04-26-1951 Referring Provider (OT): Dr. Drusilla Kanner   Encounter Date: 02/04/2019  OT End of Session - 02/04/19 1624    Visit Number  9    Number of Visits  16    Date for OT Re-Evaluation  02/26/19    Authorization Type  [employer]    Authorization - Visit Number  9    Authorization - Number of Visits  10    OT Start Time  1500    OT Stop Time  1545    OT Time Calculation (min)  45 min    Activity Tolerance  Patient tolerated treatment well    Behavior During Therapy  [facility] for tasks assessed/performed       Past Medical History:  Diagnosis Date  . Allergy   . Hyperlipidemia   . Hypertension   . Substance abuse [facility])     Past Surgical History:  Procedure Laterality Date  . INGUINAL HERNIA REPAIR Right 03/10/2015   Procedure: OPEN RIGHT INGUINAL HERNIA REPAIR;  Surgeon: Johnathan Hausen, MD;  [facility];  Service: General;  Laterality: Right;  With MESH  . VASECTOMY      There were no vitals filed for this visit.  Subjective Assessment - 02/04/19 1508    Subjective   It may be a little better, but not signifcantly so. I also saw the doctor and he ordered an MRI    Pertinent History  Pain Rt wrist started months ago PMH: HTN    Special Tests  (+) Finklesteins for DeQuervains    Currently in Pain?  Yes    Pain Score  4     Pain Location  Wrist   BASE OF THUMB   Pain Orientation  Right    Pain Descriptors / Indicators  Aching    Pain Type  Acute pain    Pain Onset  More than a month ago    Pain Frequency  Intermittent    Aggravating Factors   overuse, not wearing brace    Pain Relieving Factors  ice, rest       CLINIC OPERATION CHANGES: Outpatient Neuro Rehab is open at lower capacity  following universal masking, social distancing, and patient screening.  The patient's COVID risk of complications score is 3.  Discussed recent MD visit and order for MRI. Also discussed placing pt on hold if MRI results still not in by next visit, or d/c if results determine therapy will not further progress patient.  Pt instructed in isometric HEP for wrist w/ no increase in pain (pt did have some pain w/ flexion but adjusted method to decr pain) - see pt instructions for details Ultrasound to radial wrist area x 8 min. 0.8 wts/cm2, 20% pulsed, at 3 Mhz.                     OT Education - 02/04/19 1519    Education Details  Isometric wrist HEP    Person(s) Educated  Patient    Methods  Explanation;Demonstration;Handout    Comprehension  Verbalized understanding;Returned demonstration       OT Short Term Goals - 02/04/19 1625      OT SHORT TERM GOAL #1   Title  Independent with initial HEP (including wrist and thumb A/ROM separate,  and isometric wrist strengthening)    Time  4    Period  Weeks    Status  Achieved      OT SHORT TERM GOAL #2   Title  Independent with splint wear and care    Time  4    Period  Weeks    Status  Achieved      OT SHORT TERM GOAL #3   Title  Pt to report decreased pain overall with task modifications/adaptations and A/E prn, as well as use of modalities, and splint prn    Time  4    Period  Weeks    Status  Achieved        OT Long Term Goals - 02/04/19 1625      OT LONG TERM GOAL #1   Title  Pt to return to using Rt hand as assist/non dominant hand with pain consistently under 5/10    Baseline  up to 10/10    Time  8    Period  Weeks    Status  Achieved   4/10     OT LONG TERM GOAL #2   Title  Rt hand 3 pt pinch to be 18 lbs or greater for opening wrappers, etc.    Time  8    Period  Weeks    Status  New      OT LONG TERM GOAL #3   Title  Indepndent with updated strengthening HEP    Time  8    Period  Weeks     Status  Deferred   pt has not improved in pain enough to progress           Plan - 02/04/19 1626    Clinical Impression Statement  Pt is progressing overall. He continues to experience mild pain in his right wrist, which worsens with repetative use and typing.    Occupational performance deficits (Please refer to evaluation for details):  ADL's;IADL's;Work    Body Structure / Function / Physical Skills  ADL;ROM;IADL;Strength;Pain;UE functional use    Rehab Potential  Good    OT Frequency  2x / week    OT Duration  8 weeks    OT Treatment/Interventions  Self-care/ADL training;Therapeutic exercise;Splinting;Ultrasound;Therapeutic activities;Iontophoresis;Paraffin;Cryotherapy;DME and/or AE instruction;Compression bandaging;Fluidtherapy;Moist Heat;Contrast Bath;Passive range of motion;Patient/family education    Plan  assess remaining goals, and place on hold vs d/c (dependent on MRI results)    Consulted and Agree with Plan of Care  Patient       Patient will benefit from skilled therapeutic intervention in order to improve the following deficits and impairments:   Body Structure / Function / Physical Skills: ADL, ROM, IADL, Strength, Pain, UE functional use       Visit Diagnosis: 1. Pain in right wrist   2. Muscle weakness (generalized)       Problem List Patient Active Problem List   Diagnosis Date Noted  . De Quervain's tenosynovitis, right 10/07/2018  . Benign prostatic hyperplasia without lower urinary tract symptoms 01/18/2017  . Routine general medical examination at a health care facility 01/18/2017  . Essential hypertension 12/18/2014  . Hyperlipidemia with target LDL less than 100 12/18/2014  . First degree heart block 03/30/2013  . Family history of coronary artery disease 03/07/2013  . IMPAIRED FASTING GLUCOSE 06/30/2010  . GERD 06/26/2008  . ERECTILE DYSFUNCTION, MILD 01/30/2008  . VENTRICULAR HYPERTROPHY, LEFT 11/29/2006  . COLON POLYP 09/06/2006  .  ASTHMA, EXERCISE INDUCED 09/06/2006    Redmond Baseman  Wynetta Emery, OTR/L 02/04/2019, 4:29 PM  [facility] [address] [place], [place], [address] Phone: [phone]   Fax:  [phone]  Name: [facility] MRN: [id number] Date of Birth: 02/14/1951

## 2019-02-04 NOTE — Patient Instructions (Signed)
Wrist Extension: Isometric    With right forearm resting palm down on thigh, resist upward movement of hand with other hand. Hold __10__ seconds. Relax. Repeat _5___ times per set. Do __3__ sessions per day.  Flexion (Isometric)    With forearm held steady, palm up, use other hand to resist upward movement of hand at wrist. Hold _10___ seconds. Relax. Repeat __5__ times. Do __3__ sessions per day.     Wrist Radial Deviation: Isometric    With right forearm resting on thigh, thumb up, use other hand to resist upward movement of hand at wrist. Hold __5-10__ seconds. Relax. Repeat __5__ times per set. Do __3__ sessions per day.

## 2019-02-04 NOTE — Patient Instructions (Signed)
MRI wrist, will write to you after I get results

## 2019-02-09 ENCOUNTER — Ambulatory Visit
Admission: RE | Admit: 2019-02-09 | Discharge: 2019-02-09 | Disposition: A | Discharge status: Home / Self Care | Payer: PPO | Source: Ambulatory Visit | Attending: Family Medicine | Admitting: Family Medicine

## 2019-02-09 DIAGNOSIS — S63591A Other specified sprain of right wrist, initial encounter: Secondary | ICD-10-CM | POA: Diagnosis not present

## 2019-02-09 DIAGNOSIS — M19031 Primary osteoarthritis, right wrist: Secondary | ICD-10-CM | POA: Diagnosis not present

## 2019-02-09 DIAGNOSIS — M87039 Idiopathic aseptic necrosis of unspecified carpus: Secondary | ICD-10-CM

## 2019-02-09 DIAGNOSIS — M71331 Other bursal cyst, right wrist: Secondary | ICD-10-CM | POA: Diagnosis not present

## 2019-02-10 ENCOUNTER — Other Ambulatory Visit: Payer: Self-pay

## 2019-02-10 ENCOUNTER — Ambulatory Visit: Payer: PPO | Attending: Family Medicine | Admitting: Occupational Therapy

## 2019-02-10 DIAGNOSIS — M25531 Pain in right wrist: Secondary | ICD-10-CM | POA: Insufficient documentation

## 2019-02-10 DIAGNOSIS — M6281 Muscle weakness (generalized): Secondary | ICD-10-CM | POA: Insufficient documentation

## 2019-02-10 NOTE — Patient Instructions (Signed)
Ulnar Deviation (Isometric)    With forearm held steady and thumb up, use other hand to resist downward movement at wrist. Hold _10__ seconds. Relax. Repeat __5__ times. Do __2-3__ sessions per day.  Extension (Resistive)    With wrist over edge of table, lift __1-2__ LBS, keeping arm on table surface. Hold _2___ seconds. Lower slowly. Repeat __10__ times. Do __2__ sessions per day.   Wrist Radial Deviation: Resisted    With right thumb up, __1-2__ pound weight in hand, bend wrist up. Return slowly. Bring hand further on table, so you don't go down towards to floor as in picture Repeat _10___ times per set.  Do _2___ sessions per day.

## 2019-02-10 NOTE — Therapy (Signed)
[facility] [address] [place], [place], [address] Phone: [phone]   Fax:  [phone]  Occupational Therapy Treatment  Patient Details  Name: Clarence Ware MRN: [id number] Date of Birth: 16-Dec-1950 Referring Provider (OT): Dr. Drusilla Kanner   Encounter Date: 02/10/2019  OT End of Session - 02/10/19 1309    Visit Number  10    Number of Visits  16    Date for OT Re-Evaluation  02/26/19    Authorization Type  [employer]    Authorization - Visit Number  10    Authorization - Number of Visits  10    OT Start Time  1232    OT Stop Time  1308    OT Time Calculation (min)  36 min    Activity Tolerance  Patient tolerated treatment well    Behavior During Therapy  [facility] for tasks assessed/performed       Past Medical History:  Diagnosis Date  . Allergy   . Hyperlipidemia   . Hypertension   . Substance abuse [facility])     Past Surgical History:  Procedure Laterality Date  . INGUINAL HERNIA REPAIR Right 03/10/2015   Procedure: OPEN RIGHT INGUINAL HERNIA REPAIR;  Surgeon: Johnathan Hausen, MD;  Location: [facility];  Service: General;  Laterality: Right;  With MESH  . VASECTOMY      There were no vitals filed for this visit.  Subjective Assessment - 02/10/19 1242    Pertinent History  Pain Rt wrist started months ago PMH: HTN    Special Tests  (+) Finklesteins for DeQuervains    Currently in Pain?  Yes    Pain Score  2     Pain Location  Wrist    Pain Orientation  Right    Pain Descriptors / Indicators  Aching    Pain Type  Acute pain    Pain Onset  More than a month ago    Pain Frequency  Intermittent    Aggravating Factors   overuse, not wearing brace    Pain Relieving Factors  rest, splint      MRI results in and reviewed only to within scope of practice and encouraged pt to discuss further with MD. Pt was informed of TFCC tear but did not see tears w/ EPL and APL, just inflammation consistent  with DeQuervains. Discussed placing pt on hold after this visit until after pt sees MD.  Reviewed isometric ex's and added ulnar deviation to help strengthen ulnar side as pt had no pain with this. Also added wrist strengthening w/ 1-2 weight in wrist extension and radial deviation to tolerance. Did not perform wrist flexion (in supination) w/ weight d/t pain.                      OT Education - 02/10/19 1259    Education Details  light strengthening HEP (as tolerated and modified prn)    Person(s) Educated  Patient    Methods  Explanation;Demonstration;Handout    Comprehension  Verbalized understanding;Returned demonstration       OT Short Term Goals - 02/04/19 1625      OT SHORT TERM GOAL #1   Title  Independent with initial HEP (including wrist and thumb A/ROM separate, and isometric wrist strengthening)    Time  4    Period  Weeks    Status  Achieved      OT SHORT TERM GOAL #2   Title  Independent with  splint wear and care    Time  4    Period  Weeks    Status  Achieved      OT SHORT TERM GOAL #3   Title  Pt to report decreased pain overall with task modifications/adaptations and A/E prn, as well as use of modalities, and splint prn    Time  4    Period  Weeks    Status  Achieved        OT Long Term Goals - 02/10/19 1310      OT LONG TERM GOAL #1   Title  Pt to return to using Rt hand as assist/non dominant hand with pain consistently under 5/10    Baseline  up to 10/10    Time  8    Period  Weeks    Status  Partially Met   mostly 0-4/10, but occasionally may go up to 60/04 w/ certain movements     OT LONG TERM GOAL #2   Title  Rt hand 3 pt pinch to be 18 lbs or greater for opening wrappers, etc.    Time  8    Period  Weeks    Status  Unable to assess      OT LONG TERM GOAL #3   Title  Indepndent with updated strengthening HEP    Time  8    Period  Weeks    Status  Achieved   w/ modifications, pt told to d/c if causing increased pain            Plan - 02/10/19 1311    Clinical Impression Statement  Pt has met all STG's and has overall had some decrease in pain. Pt will be placed on hold until MD sees again. Will most likely d/c if no further needs secondary to no further treatment options/reaching max rehab potential.    Body Structure / Function / Physical Skills  ADL;ROM;IADL;Strength;Pain;UE functional use    Rehab Potential  Good    OT Frequency  2x / week    OT Duration  8 weeks    OT Treatment/Interventions  Self-care/ADL training;Therapeutic exercise;Splinting;Ultrasound;Therapeutic activities;Iontophoresis;Paraffin;Cryotherapy;DME and/or AE instruction;Compression bandaging;Fluidtherapy;Moist Heat;Contrast Bath;Passive range of motion;Patient/family education    Plan  Place on hold until pt sees MD - anticipating d/c if unable to progress. Also will d/c episode of care if pt does not return in 30 days    Consulted and Agree with Plan of Care  Patient       Patient will benefit from skilled therapeutic intervention in order to improve the following deficits and impairments:   Body Structure / Function / Physical Skills: ADL, ROM, IADL, Strength, Pain, UE functional use       Visit Diagnosis: 1. Pain in right wrist   2. Muscle weakness (generalized)       Problem List Patient Active Problem List   Diagnosis Date Noted  . De Quervain's tenosynovitis, right 10/07/2018  . Benign prostatic hyperplasia without lower urinary tract symptoms 01/18/2017  . Routine general medical examination at a health care facility 01/18/2017  . Essential hypertension 12/18/2014  . Hyperlipidemia with target LDL less than 100 12/18/2014  . First degree heart block 03/30/2013  . Family history of coronary artery disease 03/07/2013  . IMPAIRED FASTING GLUCOSE 06/30/2010  . GERD 06/26/2008  . ERECTILE DYSFUNCTION, MILD 01/30/2008  . VENTRICULAR HYPERTROPHY, LEFT 11/29/2006  . COLON POLYP 09/06/2006  . ASTHMA, EXERCISE  INDUCED 09/06/2006    Carey Bullocks, OTR/L 02/10/2019,  2:12 PM  [facility] [address] [place], [place], [address] Phone: [phone]   Fax:  [phone]  Name: Clarence Ware MRN: [id number] Date of Birth: June 25, 1951

## 2019-03-07 ENCOUNTER — Ambulatory Visit (INDEPENDENT_AMBULATORY_CARE_PROVIDER_SITE_OTHER): Payer: PPO | Admitting: *Deleted

## 2019-03-07 DIAGNOSIS — Z0000 Encounter for general adult medical examination without abnormal findings: Secondary | ICD-10-CM

## 2019-03-07 NOTE — Progress Notes (Addendum)
Subjective:   Clarence Ware is a 68 y.o. male who presents for Medicare Annual/Subsequent preventive examination. I connected with patient by a telephone and verified that I am speaking with the correct person using two identifiers. Patient stated full name and DOB. Patient gave permission to continue with telephonic visit. Patient's location was at home and Nurse's location was at [place] office.   Review of Systems:   Cardiac Risk Factors include: advanced age (>76men, >38 women);dyslipidemia;male gender;hypertension Sleep patterns: feels rested on waking, gets up 1 times nightly to void and sleeps7 hours nightly.    Home Safety/Smoke Alarms: Feels safe in home. Smoke alarms in place.  Living environment; residence and Firearm Safety: 1-story house/ trailer. Lives with wife, no needs for DME, good support system Seat Belt Safety/Bike Helmet: Wears seat belt.      Objective:    Vitals: There were no vitals taken for this visit.  There is no height or weight on file to calculate BMI.  Advanced Directives 03/07/2019 12/27/2018 01/25/2018 01/19/2017 03/19/2015 03/10/2015 02/26/2015  Does Patient Have a Medical Advance Directive? Yes Yes Yes Yes Yes - Yes  Type of Advance Directive [place];Living will Healthcare Power of [place];Living will [place];Living will - - [place];Living will  Does patient want to make changes to medical advance directive? - - - - - - No - Patient declined  Copy of [place] in Chart? No - copy requested - No - copy requested Yes - (No Data) No - copy requested    Tobacco Social History   Tobacco Use  Smoking Status Former Smoker  . Packs/day: 0.25  . Years: 1.00  . Pack years: 0.25  . Quit date: 07/10/1992  . Years since quitting: 26.6  Smokeless Tobacco Never Used     Counseling given: Not Answered  Past Medical History:  Diagnosis Date  .  Allergy   . First degree heart block   . Hyperlipidemia   . Hypertension   . Substance abuse [facility])    Past Surgical History:  Procedure Laterality Date  . INGUINAL HERNIA REPAIR Right 03/10/2015   Procedure: OPEN RIGHT INGUINAL HERNIA REPAIR;  Surgeon: Johnathan Hausen, MD;  Location: [facility];  Service: General;  Laterality: Right;  With MESH  . VASECTOMY     Family History  Problem Relation Age of Onset  . Heart disease Mother   . Stroke Mother   . Heart disease Father 30       MI  . Hypertension Father   . Hypertension Brother   . Diabetes Brother   . Heart disease Brother   . Hyperlipidemia Brother   . Cancer Brother   . Hyperlipidemia Sister   . Coronary artery disease Brother 37       CABG  . Colon cancer Paternal Grandmother    Social History   Socioeconomic History  . Marital status: Married    Spouse name: Not on file  . Number of children: 2  . Years of education: Not on file  . Highest education level: Not on file  Occupational History  . Occupation: Software engineer part-time  Social Needs  . Financial resource strain: Not hard at all  . Food insecurity    Worry: Never true    Inability: Never true  . Transportation needs    Medical: No    Non-medical: No  Tobacco Use  . Smoking status: Former Smoker  Packs/day: 0.25    Years: 1.00    Pack years: 0.25    Quit date: 07/10/1992    Years since quitting: 26.6  . Smokeless tobacco: Never Used  Substance and Sexual Activity  . Alcohol use: No    Alcohol/week: 0.0 standard drinks  . Drug use: No    Comment: sobrity for 10 years  . Sexual activity: Yes  Lifestyle  . Physical activity    Days per week: 3 days    Minutes per session: 40 min  . Stress: Not at all  Relationships  . Social connections    Talks on phone: More than three times a week    Gets together: More than three times a week    Attends religious service: More than 4 times per year    Active member of club or organization: Yes    Attends  meetings of clubs or organizations: More than 4 times per year    Relationship status: Married  Other Topics Concern  . Not on file  Social History Narrative   Married. Education: [employer].   Exercise: some    Outpatient Encounter Medications as of 03/07/2019  Medication Sig  . aspirin EC 81 MG tablet Take 81 mg by mouth daily.  Marland Kitchen atorvastatin (LIPITOR) 40 MG tablet TAKE ONE TABLET BY MOUTH DAILY  . cetirizine (ZYRTEC) 10 MG tablet Take 10 mg by mouth as directed.   . Cholecalciferol (VITAMIN D) 2000 units CAPS Take 1 capsule by mouth daily.  Marland Kitchen ezetimibe (ZETIA) 10 MG tablet TAKE ONE TABLET BY MOUTH DAILY  . losartan (COZAAR) 25 MG tablet TAKE ONE TABLET BY MOUTH DAILY  . metoprolol succinate (TOPROL-XL) 25 MG 24 hr tablet TAKE ONE-HALF TABLET (12.5MG ) BY MOUTH DAILY  . Multiple Vitamin (MULTI-VITAMIN PO) Take 1 tablet by mouth.  . tadalafil (CIALIS) 20 MG tablet Use as directed.  . [DISCONTINUED] Diclofenac Sodium 2 % SOLN Place 2 g onto the skin 2 (two) times daily. (Patient not taking: Reported on 03/07/2019)  . [DISCONTINUED] nitroGLYCERIN (NITRODUR - DOSED IN MG/24 HR) 0.2 mg/hr patch 1/4 patch daily (Patient not taking: Reported on 03/07/2019)   No facility-administered encounter medications on file as of 03/07/2019.     Activities of Daily Living In your present state of health, do you have any difficulty performing the following activities: 03/07/2019  Hearing? Y  Vision? N  Difficulty concentrating or making decisions? N  Walking or climbing stairs? N  Dressing or bathing? N  Doing errands, shopping? N  Preparing Food and eating ? N  Using the Toilet? N  In the past six months, have you accidently leaked urine? N  Do you have problems with loss of bowel control? N  Managing your Medications? N  Managing your Finances? N  Housekeeping or managing your Housekeeping? N  Some recent data might be hidden    Patient Care Team: [name], MD as PCP - General (Internal  Medicine) Troy Sine, MD as PCP - Cardiology (Cardiology) Franchot Gallo, MD as Consulting Physician (Urology) Katy Apo, MD as Consulting Physician (Ophthalmology) Irene Shipper, MD as Consulting Physician (Gastroenterology)   Assessment:   This is a routine wellness examination for [name]. Physical assessment deferred to PCP.   Exercise Activities and Dietary recommendations Current Exercise Habits: Home exercise routine, Type of exercise: walking, Time (Minutes): 40, Frequency (Times/Week): 3, Weekly Exercise (Minutes/Week): 120, Intensity: Mild, Exercise limited by: orthopedic condition(s) Diet (meal preparation, eat out, water intake, caffeinated beverages, dairy  products, fruits and vegetables): in general, a "healthy" diet  , well balanced. eats a variety of fruits and vegetables daily, limits salt, fat/cholesterol, sugar,carbohydrates,caffeine, drinks 6-8 glasses of water daily.  Goals    . Patient Stated     Continue to eat healthy and more natural, increase my exercise to be routine, travel, enjoy life, family and continue to be active in the 12 step program.       Fall Risk Fall Risk  03/07/2019 07/22/2018 01/25/2018 01/19/2017 01/18/2017  Falls in the past year? 0 0 No No Yes  Number falls in past yr: 0 0 - - 1  Injury with Fall? - 0 - - Yes  Follow up - Falls evaluation completed - - Falls prevention discussed    Depression Screen PHQ 2/9 Scores 07/22/2018 01/25/2018 01/19/2017 01/18/2017  PHQ - 2 Score 0 0 0 0  PHQ- 9 Score - 0 - -    Cognitive Function       Ad8 score reviewed for issues:  Issues making decisions: no  Less interest in hobbies / activities: no  Repeats questions, stories (family complaining): no  Trouble using ordinary gadgets (microwave, computer, phone):no  Forgets the month or year: no  Mismanaging finances: no  Remembering appts: no  Daily problems with thinking and/or memory: no Ad8 score is= 0  Immunization History   Administered Date(s) Administered  . Hepatitis B 06/09/2013  . Influenza-Unspecified 06/09/2013, 04/09/2014, 04/27/2018  . Pneumococcal Conjugate-13 01/13/2016  . Pneumococcal Polysaccharide-23 01/18/2017  . Tdap 10/03/2011  . Zoster 06/09/2013  . Zoster Recombinat (Shingrix) 05/19/2018   Screening Tests Health Maintenance  Topic Date Due  . INFLUENZA VACCINE  02/08/2019  . TETANUS/TDAP  10/02/2021  . COLONOSCOPY  03/18/2025  . Hepatitis C Screening  Completed  . PNA vac Low Risk Adult  Completed      Plan:     Reviewed health maintenance screenings with patient today and relevant education, vaccines, and/or referrals were provided.   I have personally reviewed and noted the following in the patient's chart:   . Medical and social history . Use of alcohol, tobacco or illicit drugs  . Current medications and supplements . Functional ability and status . Nutritional status . Physical activity . Advanced directives . List of other physicians . Screenings to include cognitive, depression, and falls . Referrals and appointments  In addition, I have reviewed and discussed with patient certain preventive protocols, quality metrics, and best practice recommendations. A written personalized care plan for preventive services as well as general preventive health recommendations were provided to patient.     Michiel Cowboy, RN  03/07/2019  Medical screening examination/treatment/procedure(s) were performed by non-physician practitioner and as supervising physician I was immediately available for consultation/collaboration. I agree with above. Cathlean Cower, MD

## 2019-03-10 DIAGNOSIS — H25042 Posterior subcapsular polar age-related cataract, left eye: Secondary | ICD-10-CM | POA: Diagnosis not present

## 2019-03-10 DIAGNOSIS — H35373 Puckering of macula, bilateral: Secondary | ICD-10-CM | POA: Diagnosis not present

## 2019-03-10 DIAGNOSIS — H2513 Age-related nuclear cataract, bilateral: Secondary | ICD-10-CM | POA: Diagnosis not present

## 2019-03-10 DIAGNOSIS — H524 Presbyopia: Secondary | ICD-10-CM | POA: Diagnosis not present

## 2019-03-11 NOTE — Progress Notes (Signed)
[facility] [place], [place] [address] Phone: [phone] Subjective:   [name] am serving as a Education administrator for Dr. Hulan Saas.   CC: Right wrist pain follow-up  [id number]   02/04/2019 De Quervain's tenosynovitis.  Patient has healed well.  Patient's ultrasound today shows that there is an increase in abnormal vascularity in the surrounding area and potentially a small cortical defect.  X-rays I believe at this time we do need an MRI to further evaluate.  Rule out avascular necrosis of the scaphoid underlying, distal radial fracture.  Also see if there is a nonhealing tear of the abductor pollicis longus tendon.  After the imaging we will discuss further treatment options including the possibility of PRP or surgical intervention.  Spent  25 minutes with patient face-to-face and had greater than 50% of counseling including as described above in assessment and plan.  03/12/2019 Lenwood Lehmer is a 68 y.o. male coming in with complaint of right wrist pain. MRI fu. Pain is getting better but he is doing worse. Pain scale 4/10.  MRI was independently visualized by me.  MRI shows the patient did have initially de Quervain's tenosynovitis but no treatment.  Still appreciated.  Wound appears to have some scar tissue formation from the previous tearing, patient did have edema within the radial styloid as well as a degenerative tear of the TFCC.  Patient is having no pain over the lateral aspect of the wrist though.     Past Medical History:  Diagnosis Date  . Allergy   . First degree heart block   . Hyperlipidemia   . Hypertension   . Substance abuse [facility])    Past Surgical History:  Procedure Laterality Date  . INGUINAL HERNIA REPAIR Right 03/10/2015   Procedure: OPEN RIGHT INGUINAL HERNIA REPAIR;  Surgeon: Johnathan Hausen, MD;  Location: [facility];  Service: General;  Laterality: Right;  With MESH  . VASECTOMY     Social History    Socioeconomic History  . Marital status: Married    Spouse name: Not on file  . Number of children: 2  . Years of education: Not on file  . Highest education level: Not on file  Occupational History  . Occupation: Software engineer part-time  Social Needs  . Financial resource strain: Not hard at all  . Food insecurity    Worry: Never true    Inability: Never true  . Transportation needs    Medical: No    Non-medical: No  Tobacco Use  . Smoking status: Former Smoker    Packs/day: 0.25    Years: 1.00    Pack years: 0.25    Quit date: 07/10/1992    Years since quitting: 26.6  . Smokeless tobacco: Never Used  Substance and Sexual Activity  . Alcohol use: No    Alcohol/week: 0.0 standard drinks  . Drug use: No    Comment: sobrity for 10 years  . Sexual activity: Yes  Lifestyle  . Physical activity    Days per week: 3 days    Minutes per session: 40 min  . Stress: Not at all  Relationships  . Social connections    Talks on phone: More than three times a week    Gets together: More than three times a week    Attends religious service: More than 4 times per year    Active member of club or organization: Yes    Attends meetings of clubs or organizations: More  than 4 times per year    Relationship status: Married  Other Topics Concern  . Not on file  Social History Narrative   Married. Education: [employer].   Exercise: some   Allergies  Allergen Reactions  . Nexium [Esomeprazole Magnesium]     Awful regurgitation   Family History  Problem Relation Age of Onset  . Heart disease Mother   . Stroke Mother   . Heart disease Father 85       MI  . Hypertension Father   . Hypertension Brother   . Diabetes Brother   . Heart disease Brother   . Hyperlipidemia Brother   . Cancer Brother   . Hyperlipidemia Sister   . Coronary artery disease Brother 28       CABG  . Colon cancer Paternal Grandmother      Current Outpatient Medications (Cardiovascular):  .  atorvastatin  (LIPITOR) 40 MG tablet, TAKE ONE TABLET BY MOUTH DAILY .  ezetimibe (ZETIA) 10 MG tablet, TAKE ONE TABLET BY MOUTH DAILY .  losartan (COZAAR) 25 MG tablet, TAKE ONE TABLET BY MOUTH DAILY .  metoprolol succinate (TOPROL-XL) 25 MG 24 hr tablet, TAKE ONE-HALF TABLET (12.5MG ) BY MOUTH DAILY .  tadalafil (CIALIS) 20 MG tablet, Use as directed.  Current Outpatient Medications (Respiratory):  .  cetirizine (ZYRTEC) 10 MG tablet, Take 10 mg by mouth as directed.   Current Outpatient Medications (Analgesics):  .  aspirin EC 81 MG tablet, Take 81 mg by mouth daily.   Current Outpatient Medications (Other):  Marland Kitchen  Cholecalciferol (VITAMIN D) 2000 units CAPS, Take 1 capsule by mouth daily. .  Multiple Vitamin (MULTI-VITAMIN PO), Take 1 tablet by mouth.    Past medical history, social, surgical and family history all reviewed in electronic medical record.  No pertanent information unless stated regarding to the chief complaint.   Review of Systems:  No headache, visual changes, nausea, vomiting, diarrhea, constipation, dizziness, abdominal pain, skin rash, fevers, chills, night sweats, weight loss, swollen lymph nodes, body aches, joint swelling, muscle aches, chest pain, shortness of breath, mood changes.   Objective  Blood pressure 130/80, pulse (!) 55, height 6\' 3"  (1.905 m), weight 216 lb (98 kg), SpO2 97 %.    General: No apparent distress alert and oriented x3 mood and affect normal, dressed appropriately.  HEENT: Pupils equal, extraocular movements intact  Respiratory: Patient's speak in full sentences and does not appear short of breath  Cardiovascular: No lower extremity edema, non tender, no erythema  Skin: Warm dry intact with no signs of infection or rash on extremities or on axial skeleton.  Abdomen: Soft nontender  Neuro: Cranial nerves II through XII are intact, neurovascularly intact in all extremities with 2+ DTRs and 2+ pulses.  Lymph: No lymphadenopathy of posterior or anterior  cervical chain or axillae bilaterally.  Gait normal with good balance and coordination.  MSK:  Non tender with full range of motion and good stability and symmetric strength and tone of shoulders, elbows, , hip, knee and ankles bilaterally.   Right wrist exam has a negative Finkelstein's today which is an improvement.  Still though has some swelling over the radial styloid.  Patient does have near full range of motion and full strength noted.  Neurovascular intact distally.    Impression and Recommendations:     The above documentation has been reviewed and is accurate and complete Lyndal Pulley, DO       Note: This dictation was prepared with Dragon dictation  along with smaller phrase technology. Any transcriptional errors that result from this process are unintentional.

## 2019-03-12 ENCOUNTER — Encounter: Payer: Self-pay | Admitting: Family Medicine

## 2019-03-12 ENCOUNTER — Ambulatory Visit (INDEPENDENT_AMBULATORY_CARE_PROVIDER_SITE_OTHER): Payer: PPO | Admitting: Family Medicine

## 2019-03-12 ENCOUNTER — Other Ambulatory Visit: Payer: Self-pay

## 2019-03-12 DIAGNOSIS — M654 Radial styloid tenosynovitis [de Quervain]: Secondary | ICD-10-CM | POA: Diagnosis not present

## 2019-03-12 NOTE — Assessment & Plan Note (Signed)
Patient's MRI is consistent with a de Quervain's tenosynovitis as well as some irritation to the radial bone.  Patient should do relatively well with conservative therapy still at this time.  Patient is making some progress.  We discussed the possibility of injection, PRP, or repeating formal physical therapy.  At this time patient would like to continue with conservative therapy now that the MRI shows that patient should do well with it.  Follow-up with me again in 6 to 8 weeks

## 2019-03-12 NOTE — Patient Instructions (Addendum)
Good to see you Keep going! See me again in 4- 5 weeks. If no better injection

## 2019-03-16 ENCOUNTER — Other Ambulatory Visit: Payer: Self-pay | Admitting: Internal Medicine

## 2019-03-16 ENCOUNTER — Other Ambulatory Visit: Payer: Self-pay | Admitting: Cardiovascular Disease

## 2019-03-16 DIAGNOSIS — E785 Hyperlipidemia, unspecified: Secondary | ICD-10-CM

## 2019-03-16 DIAGNOSIS — I10 Essential (primary) hypertension: Secondary | ICD-10-CM

## 2019-04-04 ENCOUNTER — Telehealth: Payer: Self-pay

## 2019-04-04 DIAGNOSIS — Z20822 Contact with and (suspected) exposure to covid-19: Secondary | ICD-10-CM

## 2019-04-04 DIAGNOSIS — Z20828 Contact with and (suspected) exposure to other viral communicable diseases: Secondary | ICD-10-CM

## 2019-04-04 NOTE — Telephone Encounter (Signed)
Pt contacted PCP and is requested COVID test. Test has been entered and pt has been informed of location to have the test performed.

## 2019-04-09 ENCOUNTER — Ambulatory Visit: Payer: PPO | Admitting: Family Medicine

## 2019-04-09 ENCOUNTER — Other Ambulatory Visit: Payer: Self-pay

## 2019-04-09 ENCOUNTER — Ambulatory Visit: Payer: Self-pay

## 2019-04-09 ENCOUNTER — Encounter: Payer: Self-pay | Admitting: Family Medicine

## 2019-04-09 VITALS — BP 124/78 | HR 54 | Ht 75.0 in | Wt 216.0 lb

## 2019-04-09 DIAGNOSIS — M25531 Pain in right wrist: Secondary | ICD-10-CM

## 2019-04-09 DIAGNOSIS — M654 Radial styloid tenosynovitis [de Quervain]: Secondary | ICD-10-CM | POA: Diagnosis not present

## 2019-04-09 NOTE — Patient Instructions (Addendum)
Good to see you Wear brace at night until Monday See me again in 5 weeks if not perfect

## 2019-04-09 NOTE — Progress Notes (Signed)
[name] [address], [place] [address] Phone: [phone] Subjective:   [name] am serving as a Education administrator for Dr. Hulan Saas.   CC: Right wrist pain follow-up  [id number]   03/12/2019 Patient's MRI is consistent with a de Quervain's tenosynovitis as well as some irritation to the radial bone.  Patient should do relatively well with conservative therapy still at this time.  Patient is making some progress.  We discussed the possibility of injection, PRP, or repeating formal physical therapy.  At this time patient would like to continue with conservative therapy now that the MRI shows that patient should do well with it.  Follow-up with me again in 6 to 8 weeks  04/09/2019 Clarence Ware is a 68 y.o. male coming in with complaint of right wrist pain. States the wrist is ok. Has noticed minimal improvement.  Patient states that he is feeling approximately 90% better.  Still has some discomfort and pain but nothing as severe as what he has had previously.  Patient though is still frustrated that he has some pain at all.      Past Medical History:  Diagnosis Date  . Allergy   . First degree heart block   . Hyperlipidemia   . Hypertension   . Substance abuse [facility])    Past Surgical History:  Procedure Laterality Date  . INGUINAL HERNIA REPAIR Right 03/10/2015   Procedure: OPEN RIGHT INGUINAL HERNIA REPAIR;  Surgeon: Johnathan Hausen, MD;  Location: [facility];  Service: General;  Laterality: Right;  With MESH  . VASECTOMY     Social History   Socioeconomic History  . Marital status: Married    Spouse name: Not on file  . Number of children: 2  . Years of education: Not on file  . Highest education level: Not on file  Occupational History  . Occupation: Software engineer part-time  Social Needs  . Financial resource strain: Not hard at all  . Food insecurity    Worry: Never true    Inability: Never true  . Transportation needs   Medical: No    Non-medical: No  Tobacco Use  . Smoking status: Former Smoker    Packs/day: 0.25    Years: 1.00    Pack years: 0.25    Quit date: 07/10/1992    Years since quitting: 26.7  . Smokeless tobacco: Never Used  Substance and Sexual Activity  . Alcohol use: No    Alcohol/week: 0.0 standard drinks  . Drug use: No    Comment: sobrity for 10 years  . Sexual activity: Yes  Lifestyle  . Physical activity    Days per week: 3 days    Minutes per session: 40 min  . Stress: Not at all  Relationships  . Social connections    Talks on phone: More than three times a week    Gets together: More than three times a week    Attends religious service: More than 4 times per year    Active member of club or organization: Yes    Attends meetings of clubs or organizations: More than 4 times per year    Relationship status: Married  Other Topics Concern  . Not on file  Social History Narrative   Married. Education: [employer].   Exercise: some   Allergies  Allergen Reactions  . Nexium [Esomeprazole Magnesium]     Awful regurgitation   Family History  Problem Relation Age of Onset  . Heart disease  Mother   . Stroke Mother   . Heart disease Father 54       MI  . Hypertension Father   . Hypertension Brother   . Diabetes Brother   . Heart disease Brother   . Hyperlipidemia Brother   . Cancer Brother   . Hyperlipidemia Sister   . Coronary artery disease Brother 68       CABG  . Colon cancer Paternal Grandmother      Current Outpatient Medications (Cardiovascular):  .  atorvastatin (LIPITOR) 40 MG tablet, TAKE ONE TABLET BY MOUTH DAILY .  ezetimibe (ZETIA) 10 MG tablet, TAKE ONE TABLET BY MOUTH DAILY .  losartan (COZAAR) 25 MG tablet, TAKE ONE TABLET BY MOUTH DAILY .  metoprolol succinate (TOPROL-XL) 25 MG 24 hr tablet, TAKE ONE-HALF TABLET (12.5MG ) BY MOUTH DAILY .  tadalafil (CIALIS) 20 MG tablet, Use as directed.  Current Outpatient Medications (Respiratory):  .   cetirizine (ZYRTEC) 10 MG tablet, Take 10 mg by mouth as directed.   Current Outpatient Medications (Analgesics):  .  aspirin EC 81 MG tablet, Take 81 mg by mouth daily.   Current Outpatient Medications (Other):  Marland Kitchen  Cholecalciferol (VITAMIN D) 2000 units CAPS, Take 1 capsule by mouth daily. .  Multiple Vitamin (MULTI-VITAMIN PO), Take 1 tablet by mouth.    Past medical history, social, surgical and family history all reviewed in electronic medical record.  No pertanent information unless stated regarding to the chief complaint.   Review of Systems:  No headache, visual changes, nausea, vomiting, diarrhea, constipation, dizziness, abdominal pain, skin rash, fevers, chills, night sweats, weight loss, swollen lymph nodes, body aches, joint swelling,  chest pain, shortness of breath, mood changes.  Positive muscle aches  Objective  Blood pressure 124/78, pulse (!) 54, height 6\' 3"  (1.905 m), weight 216 lb (98 kg), SpO2 98 %.    General: No apparent distress alert and oriented x3 mood and affect normal, dressed appropriately.  HEENT: Pupils equal, extraocular movements intact  Respiratory: Patient's speak in full sentences and does not appear short of breath  Cardiovascular: No lower extremity edema, non tender, no erythema  Skin: Warm dry intact with no signs of infection or rash on extremities or on axial skeleton.  Abdomen: Soft nontender  Neuro: Cranial nerves II through XII are intact, neurovascularly intact in all extremities with 2+ DTRs and 2+ pulses.  Lymph: No lymphadenopathy of posterior or anterior cervical chain or axillae bilaterally.  Gait normal with good balance and coordination.  MSK:  tender with full range of motion and good stability and symmetric strength and tone of shoulders, elbows, hip, knee and ankles bilaterally.    Right wrist exam shows the patient does have a positive Wynn Maudlin still noted.  Otherwise fairly unremarkable.  Procedure: Real-time Ultrasound  Guided Injection of right Abductor pollicis longs tendon sheath Device: GE Logiq E  Ultrasound guided injection is preferred based studies that show increased duration, increased effect, greater accuracy, decreased procedural pain, increased response rate with ultrasound guided versus blind injection.  Verbal informed consent obtained.  Time-out conducted.  Noted no overlying erythema, induration, or other signs of local infection.  Skin prepped in a sterile fashion.  Local anesthesia: Topical Ethyl chloride.  With sterile technique and under real time ultrasound guidance:  tendon visualized.  23g 5/8 inch needle inserted distal to proximal approach into tendon sheath. Pictures taken  for needle placement. Patient did have injection of 0.5 cc of 0.5% Marcaine, and 0.5 cc of  Kenalog 40 mg/dL. Completed without difficulty  Pain immediately resolved suggesting accurate placement of the medication.  Advised to call if fevers/chills, erythema, induration, drainage, or persistent bleeding.  Images permanently stored and available for review in the ultrasound unit.  Impression: Technically successful ultrasound guided injection.   Impression and Recommendations:     This case required medical decision making of moderate complexity. The above documentation has been reviewed and is accurate and complete Lyndal Pulley, DO       Note: This dictation was prepared with Dragon dictation along with smaller phrase technology. Any transcriptional errors that result from this process are unintentional.

## 2019-04-09 NOTE — Assessment & Plan Note (Signed)
Repeat injection given today.  Tolerated procedure well.  I expect patient will do well with conservative therapy.  MRI did not show anything to pain starting.  We will do bracing for 1 week and then increase activity as tolerated.  Follow-up again in 5 weeks if not completely resolved

## 2019-04-10 ENCOUNTER — Other Ambulatory Visit: Payer: Self-pay

## 2019-04-10 DIAGNOSIS — Z20828 Contact with and (suspected) exposure to other viral communicable diseases: Secondary | ICD-10-CM | POA: Diagnosis not present

## 2019-04-10 DIAGNOSIS — Z20822 Contact with and (suspected) exposure to covid-19: Secondary | ICD-10-CM

## 2019-04-11 LAB — NOVEL CORONAVIRUS, NAA: SARS-CoV-2, NAA: NOT DETECTED

## 2019-04-16 ENCOUNTER — Encounter: Payer: Self-pay | Admitting: Family Medicine

## 2019-05-16 ENCOUNTER — Ambulatory Visit: Payer: PPO | Admitting: Family Medicine

## 2019-05-22 ENCOUNTER — Ambulatory Visit: Payer: PPO | Admitting: Cardiovascular Disease

## 2019-05-22 ENCOUNTER — Encounter: Payer: Self-pay | Admitting: Cardiovascular Disease

## 2019-05-22 ENCOUNTER — Other Ambulatory Visit: Payer: Self-pay

## 2019-05-22 DIAGNOSIS — R0683 Snoring: Secondary | ICD-10-CM | POA: Diagnosis not present

## 2019-05-22 DIAGNOSIS — I10 Essential (primary) hypertension: Secondary | ICD-10-CM | POA: Diagnosis not present

## 2019-05-22 DIAGNOSIS — R001 Bradycardia, unspecified: Secondary | ICD-10-CM

## 2019-05-22 DIAGNOSIS — Z8249 Family history of ischemic heart disease and other diseases of the circulatory system: Secondary | ICD-10-CM | POA: Diagnosis not present

## 2019-05-22 DIAGNOSIS — E785 Hyperlipidemia, unspecified: Secondary | ICD-10-CM | POA: Diagnosis not present

## 2019-05-22 DIAGNOSIS — I440 Atrioventricular block, first degree: Secondary | ICD-10-CM | POA: Diagnosis not present

## 2019-05-22 MED ORDER — CARVEDILOL 3.125 MG PO TABS: 3.1250 mg | ORAL_TABLET | Freq: Two times a day (BID) | ORAL | 3 refills | Status: DC

## 2019-05-22 NOTE — Patient Instructions (Signed)
Medication Instructions:  Your physician has recommended you make the following change in your medication:   STOP TAKING YOUR TOPROL-XL  START CARVEDILOL (COREG) 3.125 MG. ONE TABLET BY MOUTH TWICE A DAY  *If you need a refill on your cardiac medications before your next appointment, please call your pharmacy*  Lab Work: NONE If you have labs (blood work) drawn today and your tests are completely normal, you will receive your results only by: Marland Kitchen MyChart Message (if you have MyChart) OR . A paper copy in the mail If you have any lab test that is abnormal or we need to change your treatment, we will call you to review the results.  Testing/Procedures: NONE  Follow-Up: At [facility], you and your health needs are our priority.  As part of our continuing mission to provide you with exceptional heart care, we have created designated Provider Care Teams.  These Care Teams include your primary Cardiologist (physician) and Advanced Practice Providers (APPs -  Physician Assistants and Nurse Practitioners) who all work together to provide you with the care you need, when you need it.  Your next appointment:   6 months  The format for your next appointment:   In Person  Provider:   Shelva Majestic, MD

## 2019-05-22 NOTE — Progress Notes (Signed)
Patient ID: Clarence Ware, male   DOB: 09-Jan-1951, 68 y.o.   MRN: [id number]    Primary M.D.: Dr. Ivar Bury  HPI: Clarence Ware is a 68 y.o. male presents to the office today for a 5 month followup cardiology evaluation.  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  Recently, another brother has a chronic aortic dissection for which she's been treated medically.  Clarence Ware has a history of hyperlipidemia and hypertension.  Over 10 years ago he had a normal routine treadmill test. The patient has continued to exercise regularly. There is a prior history of substance abuse for which he did undergo treatment.  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  He  has a history of hyperlipidemia, erectile dysfunction, a history of intermittent headaches.  When I saw him on 11/07/2012, we discussed the sensitivity and specificity of routine treadmill testing with a strong family history I elected to schedule him for a cardiopulmonary met test for further evaluation would also be helpful to assess endothelial dysfunction/diastolic dysfunction evaluate potential for microvascular angina. He also had a soft cardiac murmur at his apex and recommended a 2-D echo Doppler study.  His cardiopulmonary met test revealed that he had good functional capacity with an excellent maximum oxygen consumption peak of 92% of predicted. However, he had a abnormal response during the last several minutes of exercise suggesting ischemic myocardial dysfunction with decreasing stroke volume with increasing work rate which corresponded to an abrupt decrease in cardiac output suggestive of exercise-induced myocardial dysfunction. He did have very mild ventilation/perfusion mismatch  with reference to his pulmonary status.  A 2-D echo Doppler study showed normal systolic function with an ejection fraction of 55-60%. He had normal diastolic parameters.  When I saw him in followup of his noninvasive studies in June 2014 due to the mild abnormality noted during the last 7 minutes of exercise I recommended the addition of ARB therapy and further titrated his atorvastatin for aggressive lipid management. He has tolerated initiation of losartan.  I last saw him on most 2 years ago.  He denied any  chest pain or palpitations. He He did have a followup NMR lipoprotein of which showed an LDL particle number of 1216 with a calculated LDL of 86, HDL 46, triglycerides 58, total cholesterol 144. He had 552 small LDL some particles. HDL particle number was reduced at 29.1. Insulin resistance score was normal at 33.  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  However, he is still walking at least 3 days per week for a minimum of 30 minutes.  He underwent a [facility] at [facility] on 05/03/201 and was not found to have significant obstructive sleep apnea and his AHI and RDI were both 2.9.  He has significant periodic limb movements of sleep and had snoring.  He tells me he had a mouth piece customized by Dr. Oneal Grout for treatment of his snoring.   I saw him in January 2019.  He denies any episodes of chest pain or shortness of breath.  He is unaware of palpitations.  He uses his  mouthguard to sleep and denies any awareness of breakthrough snoring.  He continues to be on losartan 25 mg daily, and metoprolol succinate 25 mg daily for hypertension.  He has continued to be on atorvastatin 20 mg.  Of note, laboratory 6 months ago showed an LDL cholesterol, which had risen up to 90.  Diet evaluation I further titrated atorvastatin to 40 mg.  Over the last 6 months he has continued to  remain stable.  He underwent repeat laboratory on his increased atorvastatin which had not significantly changed.  Total cholesterol was 150, triglycerides 63, HDL 48, LDL 89.  He denies chest pain PND orthopnea.  He denies shortness of breath.  He has noticed some rare dizziness particularly when he bends over.    He was last evaluated in a telemedicine visit on October 11, 2018.  Prior to his evaluation in June 2019 he had self reduced his metoprolol succinate from 25 mg down to 12.5 mg due to noticing that his heart rate was morning somewhat slow.  He started to notice that particularly following exertion his blood pressure at times have gotten low and he has recorded blood pressure levels of 114/71 with a pulse of 67, 106/68 with a pulse of 65, 95/60 with a pulse of 63 and on 1 occasion systolic blood pressure had dropped to as low as 88.  He has been taking both his losartan 25 mg  as well as metoprolol succinate  Since he was last evaluated, he continues to work remotely for long-term care facility.  He denies any episodes of chest pain.  He has been taking losartan 25 mg daily in addition to Toprol-XL 12.5 mg daily.  He is on atorvastatin 40 mg and Zetia 10 mg for hyperlipidemia.  In January 2020 LDL cholesterol was 70.  He continues to take aspirin.  He admits to some fatigue associated with bradycardia.  He presents for reevaluation.   Past Medical History:  Diagnosis Date  . Allergy   . First degree heart block   . Hyperlipidemia   . Hypertension   . Substance abuse [facility])     Past Surgical History:  Procedure Laterality Date  . INGUINAL HERNIA REPAIR Right 03/10/2015   Procedure: OPEN RIGHT INGUINAL HERNIA REPAIR;  Surgeon: Johnathan Hausen, MD;  Location: [facility];  Service: General;  Laterality: Right;  With MESH  . VASECTOMY      Allergies  Allergen Reactions  . Nexium [Esomeprazole Magnesium]     Awful regurgitation    Current Outpatient Medications  Medication Sig Dispense Refill   . aspirin EC 81 MG tablet Take 81 mg by mouth daily.    Marland Kitchen atorvastatin (LIPITOR) 40 MG tablet TAKE ONE TABLET BY MOUTH DAILY 58 tablet 0  . cetirizine (ZYRTEC) 10 MG tablet Take 10 mg by mouth as directed.     . Cholecalciferol (VITAMIN D) 2000 units CAPS Take 1 capsule by mouth daily.    Marland Kitchen ezetimibe (ZETIA) 10 MG tablet TAKE ONE TABLET BY MOUTH DAILY 90 tablet 3  . losartan (COZAAR) 25 MG tablet TAKE ONE TABLET BY MOUTH DAILY 90 tablet 0  . Multiple Vitamin (MULTI-VITAMIN PO) Take 1 tablet by mouth.    . tadalafil (CIALIS) 20 MG tablet Use as directed. 6 tablet 11  . carvedilol (COREG) 3.125 MG tablet Take 1 tablet (3.125 mg total) by mouth 2 (two) times daily. 180 tablet 3   No current facility-administered medications for this visit.     Socially he is  married has 2 children no grandchildren. He does exercise at least 30 minutes at a time. There is a prior history of tobacco use but he quit over 3 years ago and a prior use of opiates. 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  ROS General: Negative; No fevers, chills, or night sweats;  HEENT: Negative; No changes in vision or hearing, sinus congestion, difficulty swallowing Pulmonary: Negative; No cough, wheezing, shortness of breath, hemoptysis Cardiovascular: See history of present illness GI: Negative; No nausea, vomiting, diarrhea, or abdominal pain GU: Negative; No dysuria, hematuria, or difficulty voiding Musculoskeletal: Negative; no myalgias, joint pain, or weakness Hematologic/Oncology: Negative; no easy bruising, bleeding Endocrine: Negative; no heat/cold intolerance; no diabetes Neuro: Negative; no changes in balance, headaches Skin: Negative; No rashes or skin lesions Psychiatric: Negative; No behavioral problems, depression Sleep: Sleep study negative for OSA but suspicious for increased upper airway resistance.  He did have periodic limb  movements that were frequent.  No hypnogognic hallucinations, no cataplexy Other comprehensive 14 point system review is negative.  PE BP 124/76 (BP Location: Left Arm, Patient Position: Sitting, Cuff Size: Normal)   Pulse (!) 55   Temp (!) 97 F (36.1 C)   Ht '6\' 3"'$  (1.905 m)   Wt 215 lb (97.5 kg)   BMI 26.87 kg/m    Repeat blood pressure by me was 124/72 supine and 116/74 standing  Wt Readings from Last 3 Encounters:  05/22/19 215 lb (97.5 kg)  04/09/19 216 lb (98 kg)  03/12/19 216 lb (98 kg)     Physical Exam BP 124/76 (BP Location: Left Arm, Patient Position: Sitting, Cuff Size: Normal)   Pulse (!) 55   Temp (!) 97 F (36.1 C)   Ht '6\' 3"'$  (1.905 m)   Wt 215 lb (97.5 kg)   BMI 26.87 kg/m  General: Alert, oriented, no distress.  Skin: normal turgor, no rashes, warm and dry HEENT: Normocephalic, atraumatic. Pupils equal round and reactive to light; sclera anicteric; extraocular muscles intact; Fundi ** Nose without nasal septal hypertrophy Mouth/Parynx benign; Mallinpatti scale Neck: No JVD, no carotid bruits; normal carotid upstroke Lungs: clear to ausculatation and percussion; no wheezing or rales Chest wall: without tenderness to palpitation Heart: PMI not displaced, RRR, s1 s2 normal, 1/6 systolic murmur, no diastolic murmur, no rubs, gallops, thrills, or heaves Abdomen: soft, nontender; no hepatosplenomehaly, BS+; abdominal aorta nontender and not dilated by palpation. Back: no CVA tenderness Pulses 2+ Musculoskeletal: full range of motion, normal strength, no joint deformities Extremities: no clubbing cyanosis or edema, Homan's sign negative  Neurologic: grossly nonfocal; Cranial nerves grossly wnl Psychologic: Normal mood and affect   ECG (independently read by me): Sinus bradycardia 55 bpm, first-degree AV block with a PR interval 238 ms.  QTc interval 380 ms.  No ectopy.  June 2019 ECG (independently read by me): Normal sinus rhythm at 62 bpm.  First-degree  AV block with a PR interval at 240 ms.  No ectopy.  Normal intervals.   July 18, 2017 ECG (independently read by me): Normal sinus rhythm with an isolated PVC.  PR interval 202 ms.  Heart rate 63 bpm.  October 2017 ECG (independently read by me): Sinus bradycardia 57 bpm.  First-degree AV block with a PR interval at 228 ms.  No ST segment changes.  June 2016 ECG (independently read by me): Sinus rhythm with first-degree AV block.  PR interval 264 ms.  QTc  interval normal 381 ms.  September 2014 ECG: Sinus bradycardia for the first degree AV block. PR interval  220 ms. QT interval 393 ms.  LABS: BMP Latest Ref Rng & Units 07/22/2018 10/24/2017 01/18/2017  Glucose 70 - 99 mg/dL 105(H) 107(H) 110(H)  BUN 6 - 23 mg/dL _0 Creatinine 0.40 - 1.50 mg/dL 1.14 1.09 1.00  BUN/Creat Ratio 10 - 24 - 17 -  Sodium 135 - 145 mEq/L 138 141 139  Potassium 3.5 - 5.1 mEq/L 4.7 4.2 4.7  Chloride 96 - 112 mEq/L 103 102 106  CO2 19 - 32 mEq/L _1 Calcium 8.4 - 10.5 mg/dL 10.1 9.6 9.8   Hepatic Function Latest Ref Rng & Units 07/22/2018 10/24/2017 01/18/2017  Total Protein 6.0 - 8.3 g/dL 6.8 6.8 6.3  Albumin 3.5 - 5.2 g/dL 4.3 4.5 4.1  AST 0 - 37 U/L _2 ALT 0 - 53 U/L 34 28 19  Alk Phosphatase 39 - 117 U/L 77 91 74  Total Bilirubin 0.2 - 1.2 mg/dL 0.9 0.5 0.7  Bilirubin, Direct <=0.2 mg/dL - - -   CBC Latest Ref Rng & Units 07/22/2018 10/24/2017 01/18/2017  WBC 4.0 - 10.5 K/uL 6.3 6.3 5.9  Hemoglobin 13.0 - 17.0 g/dL 14.7 15.3 14.4  Hematocrit 39.0 - 52.0 % 43.3 44.5 41.9  Platelets 150.0 - 400.0 K/uL 209.0 204 200.0   Lab Results  Component Value Date   MCV 90.0 07/22/2018   MCV 90 10/24/2017   MCV 89.4 01/18/2017   Lab Results  Component Value Date   TSH 1.87 07/22/2018   Lab Results  Component Value Date   HGBA1C 6.2 07/22/2018     Lipid Panel     Component Value Date/Time   CHOL 124 07/22/2018 0859   CHOL 150 10/24/2017 0809   CHOL 144 03/03/2013 0855   TRIG 66.0  07/22/2018 0859   TRIG 58 03/03/2013 0855   HDL 41.40 07/22/2018 0859   HDL 48 10/24/2017 0809   HDL 46 03/03/2013 0855   CHOLHDL 3 07/22/2018 0859   VLDL 13.2 07/22/2018 0859   LDLCALC 70 07/22/2018 0859   LDLCALC 89 10/24/2017 0809   LDLCALC 86 03/03/2013 0855     RADIOLOGY: No results found.  IMPRESSION:  1. Essential hypertension   2. Bradycardia   3. First degree AV block   4. Family history of coronary artery disease   5. Hyperlipidemia with target LDL less than 70   6. Soring: on oral appliance     ASSESSMENT AND PLAN: Clarence. 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  He has had first-degree block.  In the past, he has had some episodes of mild dizziness particularly if he bends over which has resulted in reduction in his beta-blocker regimen.  Presently he has been on metoprolol succinate 12.5 mg in addition to losartan for blood pressure control.  Blood pressure today is stable without orthostatic drop.  He has sinus bradycardia and continues to have first-degree AV block.  We discussed his current beta-blocker regimen.  Since he does note fatigability in the setting I will try changing him to very little carvedilol 25 mg twice a day to see if he tolerates this better.  He will  continue his present dose of losartan 25 mg daily.  He continues to be on atorvastatin 40 mg and Zetia 10 mg for hyperlipidemia.  Laboratory in January 2020 showed a total cholesterol 124 with an LDL cholesterol of 70.  He is no longer using Cialis.  He denies any anginal symptomatology.  Again discussed the importance of exercising at least 5 days/week for at least 30 minutes of moderate intensity per AHA guidelines.  He uses a customized oral appliance by Dr. Oneal Grout  for sleep apnea/snoring.  Previous hemoglobin A1c was increased at 6.2 consistent with prediabetes and discussion of diet with reduction in carbohydrates and sweets have been discussed in prior evaluations.  Time spent: 25 minutes Troy Sine, MD, [facility]  05/24/2019 10:55 AM

## 2019-05-24 ENCOUNTER — Encounter: Payer: Self-pay | Admitting: Cardiovascular Disease

## 2019-05-24 DIAGNOSIS — Z8489 Family history of other specified conditions: Secondary | ICD-10-CM | POA: Insufficient documentation

## 2019-05-26 ENCOUNTER — Ambulatory Visit (INDEPENDENT_AMBULATORY_CARE_PROVIDER_SITE_OTHER): Payer: PPO | Admitting: Internal Medicine

## 2019-05-26 ENCOUNTER — Other Ambulatory Visit: Payer: Self-pay

## 2019-05-26 ENCOUNTER — Encounter: Payer: Self-pay | Admitting: Internal Medicine

## 2019-05-26 VITALS — BP 120/82 | HR 64 | Temp 97.9°F | Resp 16 | Ht 75.0 in | Wt 216.0 lb

## 2019-05-26 DIAGNOSIS — M545 Low back pain, unspecified: Secondary | ICD-10-CM

## 2019-05-26 DIAGNOSIS — M5126 Other intervertebral disc displacement, lumbar region: Secondary | ICD-10-CM | POA: Diagnosis not present

## 2019-05-26 MED ORDER — METHYLPREDNISOLONE 4 MG PO TBPK: ORAL_TABLET | ORAL | 0 refills | Status: AC

## 2019-05-26 MED ORDER — ETODOLAC ER 400 MG PO TB24: 400.0000 mg | ORAL_TABLET | Freq: Every day | ORAL | 1 refills | Status: DC

## 2019-05-26 NOTE — Patient Instructions (Signed)
Herniated Disk A herniated disk is when a disk in your spine bulges out too far. There is a disk with a spongy center in between each pair of bones in the spine (vertebrae). These disks act as shock absorbers when you move. A herniated disk can cause pain and muscle weakness. This can happen anywhere in the back or neck. It happens most often in the lower back. What are the causes? This condition may be caused by:  Wear and tear as you age.  Sudden injury, such as a strain or sprain. What increases the risk? The following factors may make you more likely to develop this condition:  Aging. This is the main thing that increases your risk.  Being a man who is 57-60 years old.  Frequently doing activities that involve heavy lifting, bending, or twisting.  Frequently driving for long hours at a time.  Not getting enough exercise.  Being overweight.  Using tobacco products. Other things that increase risk include:  Having a family history of back problems or herniated disks.  Being pregnant or giving birth.  Not getting enough vitamins and minerals (having poor nutrition).  Being tall. What are the signs or symptoms? Symptoms may vary depending on where your herniated disk is in your body.  A herniated disk in the lower back may cause sharp pain in: ? Part of the arm, leg, hip, or butt. ? The back of the lower leg (calf). ? The lower back, spreading down through the leg into the foot (sciatica).  A herniated disk in the neck may cause dizziness and a feeling that things around you are moving when they are not (vertigo). It may also cause pain or weakness in: ? The neck. ? The shoulder blades. ? The upper arm, lower arm, or fingers. You may also have muscle weakness. It may be hard to:  Lift your leg or arm.  Stand on your toes.  Squeeze tightly with one of your hands. Other symptoms may include:  Loss of feeling (numbness) or tingling in the affected areas of the  hands, arms, feet, or legs.  Being unable to control when you pee, or urinate, or when you poop, or have bowel movements. This is rare but serious. You may have a very bad herniated disk in the lower back. How is this diagnosed? This condition may be diagnosed by:  Your symptoms.  Your medical history.  A physical exam. The exam may include: ? A straight-leg test. For this test, you will lie on your back while your doctor lifts your leg, keeping your knee straight. If you feel pain, you likely have a herniated disk. ? Tests of your brain and nervous system. These include checking for numbness, reflexes, muscle strength, and problems with posture.  Tests that create pictures (images), such as: ? X-rays. ? MRI. ? CT scan. ? Electromyogram (EMG). This test may be used to find out which nerves are affected by your herniated disk. How is this treated? This condition may be treated with:  A short period of rest. This is usually the first treatment. ? You may be on bed rest for up to 2 days, or you may be told to stay home and avoid physical activity. ? If you have a herniated disk in your lower back, avoid sitting as much as possible. Sitting increases pressure on the disk.  Medicines. These may include: ? NSAIDs, such as ibuprofen, to help reduce pain and swelling. ? Muscle relaxants to prevent sudden tightening  of the back muscles (back spasms). ? Prescription pain medicines, if you have very bad pain.  Ice or heat therapy.  Steroid shots (injections) in the area of the herniated disk. These can help reduce pain and swelling.  Physical therapy to strengthen your back muscles. In many cases, symptoms go away with treatment over a period of days or weeks. You will most likely be free of symptoms after 3-4 months. If other treatments do not help, you may need surgery. Follow these instructions at home: Medicines  Take over-the-counter and prescription medicines only as told by your  doctor.  Ask your doctor if the medicine prescribed to you: ? Requires you to avoid driving or using heavy machinery. ? Can cause trouble pooping (constipation). You may need to take these actions to prevent or treat trouble pooping:  Drink enough fluid to keep your pee (urine) pale yellow.  Take over-the-counter or prescription medicines.  Eat foods that are high in fiber. These include beans, whole grains, and fresh fruits and vegetables.  Limit foods that are high in fat and processed sugars. These include fried or sweet foods. Activity  Rest as told by your doctor.  After your rest period: ? Return to your normal activities as told by your doctor. Ask your doctor what activities are safe for you. ? Use good posture. ? Avoid movements that cause pain. ? Do not lift anything that is heavier than 10 lb (4.5 kg), or the limit that you are told, until your doctor says that it is safe. ? Do not sit or stand for a long time without moving. ? Do not sit for a long time without getting up and moving around.  Do exercises (physical therapy) as told.  Try to strengthen your back and belly (abdomen) with exercises like crunches, swimming, or walking. General instructions  Do not use any products that contain nicotine or tobacco, such as cigarettes, e-cigarettes, and chewing tobacco. If you need help quitting, ask your doctor.  Do not wear high-heeled shoes.  Do not sleep on your belly.  If you are overweight, work with your doctor to lose weight safely.  Keep all follow-up visits as told by your doctor. This is important. How is this prevented?   Stay at a healthy weight.  Stay in shape. Do at least 150 minutes of moderate-intensity exercise each week, such as fast walking or water aerobics.  When lifting objects: ? Keep your feet as far apart as your shoulders or farther apart. ? Tighten your belly muscles. ? Bend your knees and hips and keep your spine neutral. Lift using  the strength of your legs, not your back. Do not lock your knees straight out. ? Always ask for help to lift heavy or awkward objects. Contact a doctor if:  You have back pain or neck pain that does not get better after 6 weeks.  You have very bad pain.  You get any of these problems in any part of your body: ? Tingling. ? Weakness. ? Numbness. Get help right away if:  You cannot move your arms or legs.  You cannot control when you pee (urinate) or poop (have a bowel movement).  You feel dizzy.  You faint.  You have trouble breathing. Summary  A herniated disk is when a disk in your spine bulges out too far.  This condition may be caused by wear and tear as you age or sudden injury.  Symptoms may vary depending on where your herniated disk  is in your body.  Treatment includes rest, medicines, ice or heat therapy, steroid shots, and exercises.  You may need surgery if other treatments do not help. This information is not intended to replace advice given to you by your health care provider. Make sure you discuss any questions you have with your health care provider. Document Released: 11/10/2013 Document Revised: 10/17/2018 Document Reviewed: 12/23/2015 Elsevier Patient Education  2020 [employer].

## 2019-05-26 NOTE — Progress Notes (Addendum)
Subjective:  Patient ID: Clarence Ware, male    DOB: Jan 18, 1951  Age: 68 y.o. MRN: [id number]  CC: Back Pain  This visit occurred during the SARS-CoV-2 public health emergency.  Safety protocols were in place, including screening questions prior to the visit, additional usage of staff PPE, and extensive cleaning of exam room while observing appropriate contact time as indicated for disinfecting solutions.    HPI [name] presents for 1 week history of nonradiating low back pain.  He says the back pain started 1 day after he had been doing some heavy yard work.  He denies a specific trauma or injury.  He says the pain is nonradiating and he denies paresthesias in his lower extremities.  He is not getting much symptom relief with over-the-counter doses of ibuprofen.  Outpatient Medications Prior to Visit  Medication Sig Dispense Refill  . aspirin EC 81 MG tablet Take 81 mg by mouth daily.    Marland Kitchen atorvastatin (LIPITOR) 40 MG tablet TAKE ONE TABLET BY MOUTH DAILY 58 tablet 0  . carvedilol (COREG) 3.125 MG tablet Take 1 tablet (3.125 mg total) by mouth 2 (two) times daily. 180 tablet 3  . cetirizine (ZYRTEC) 10 MG tablet Take 10 mg by mouth as directed.     . Cholecalciferol (VITAMIN D) 2000 units CAPS Take 1 capsule by mouth daily.    Marland Kitchen ezetimibe (ZETIA) 10 MG tablet TAKE ONE TABLET BY MOUTH DAILY 90 tablet 3  . losartan (COZAAR) 25 MG tablet TAKE ONE TABLET BY MOUTH DAILY 90 tablet 0  . Multiple Vitamin (MULTI-VITAMIN PO) Take 1 tablet by mouth.    . tadalafil (CIALIS) 20 MG tablet Use as directed. 6 tablet 11   No facility-administered medications prior to visit.     ROS Review of Systems  Constitutional: Negative for chills, diaphoresis, fatigue and fever.  HENT: Negative.   Eyes: Negative for visual disturbance.  Respiratory: Negative for cough, chest tightness, shortness of breath and wheezing.   Cardiovascular: Negative for chest pain, palpitations and leg swelling.   Gastrointestinal: Negative for abdominal pain, constipation, diarrhea, nausea and vomiting.  Endocrine: Negative.   Genitourinary: Negative.  Negative for difficulty urinating.  Musculoskeletal: Positive for back pain. Negative for neck pain.  Skin: Negative.  Negative for color change.  Neurological: Negative.  Negative for dizziness, weakness, light-headedness and numbness.  Hematological: Negative for adenopathy. Does not bruise/bleed easily.  Psychiatric/Behavioral: Negative.     Objective:  BP 120/82 (BP Location: Left Arm, Patient Position: Sitting, Cuff Size: Large)   Pulse 64   Temp 97.9 F (36.6 C) (Oral)   Resp 16   Ht 6\' 3"  (1.905 m)   Wt 216 lb (98 kg)   SpO2 94%   BMI 27.00 kg/m   BP Readings from Last 3 Encounters:  05/26/19 120/82  05/22/19 124/76  04/09/19 124/78    Wt Readings from Last 3 Encounters:  05/26/19 216 lb (98 kg)  05/22/19 215 lb (97.5 kg)  04/09/19 216 lb (98 kg)    Physical Exam Vitals signs reviewed.  Constitutional:      Appearance: Normal appearance. He is not ill-appearing or diaphoretic.  HENT:     Nose: Nose normal.     Mouth/Throat:     Mouth: Mucous membranes are moist.  Eyes:     General: No scleral icterus.    Conjunctiva/sclera: Conjunctivae normal.  Neck:     Musculoskeletal: Neck supple.  Cardiovascular:     Rate and Rhythm: Normal rate and  regular rhythm.     Heart sounds: No murmur.  Pulmonary:     Effort: Pulmonary effort is normal.     Breath sounds: No stridor. No wheezing or rhonchi.  Abdominal:     General: Abdomen is flat. There is no distension.     Palpations: Abdomen is soft. There is no hepatomegaly, splenomegaly or mass.     Tenderness: There is no guarding.  Musculoskeletal: Normal range of motion.     Lumbar back: Normal. He exhibits normal range of motion, no tenderness, no bony tenderness, no swelling, no edema, no deformity and no pain.     Right lower leg: No edema.     Left lower leg: No  edema.  Skin:    General: Skin is warm and dry.  Neurological:     General: No focal deficit present.     Mental Status: He is alert.     Cranial Nerves: Cranial nerves are intact.     Sensory: Sensation is intact.     Motor: Motor function is intact. No weakness or atrophy.     Coordination: Coordination is intact.     Deep Tendon Reflexes: Reflexes normal.     Reflex Scores:      Tricep reflexes are 1+ on the right side and 1+ on the left side.      Bicep reflexes are 1+ on the right side and 1+ on the left side.      Brachioradialis reflexes are 1+ on the right side and 1+ on the left side.      Patellar reflexes are 2+ on the right side and 2+ on the left side.      Achilles reflexes are 1+ on the right side and 1+ on the left side.    Comments: Neg SLR in BLE  Psychiatric:        Mood and Affect: Mood normal.        Behavior: Behavior normal.     Lab Results  Component Value Date   WBC 6.3 07/22/2018   HGB 14.7 07/22/2018   HCT 43.3 07/22/2018   PLT 209.0 07/22/2018   GLUCOSE 105 (H) 07/22/2018   CHOL 124 07/22/2018   TRIG 66.0 07/22/2018   HDL 41.40 07/22/2018   LDLCALC 70 07/22/2018   ALT 34 07/22/2018   AST 26 07/22/2018   NA 138 07/22/2018   K 4.7 07/22/2018   CL 103 07/22/2018   CREATININE 1.14 07/22/2018   BUN 20 07/22/2018   CO2 30 07/22/2018   TSH 1.87 07/22/2018   PSA 1.88 07/22/2018   HGBA1C 6.2 07/22/2018    Mr Wrist Right Wo Contrast  Result Date: 02/10/2019 CLINICAL DATA:  Right wrist pain, weakness and swelling for 6 months. No specific injury. EXAM: MR OF THE RIGHT WRIST WITHOUT CONTRAST TECHNIQUE: Multiplanar, multisequence MR imaging of the right wrist was performed. No intravenous contrast was administered. COMPARISON:  Radiographs 11/22/2018 FINDINGS: Ligaments: The intercarpal ligaments are intact. The intercarpal joint spaces are maintained. Triangular fibrocartilage: Degenerated and torn. There appear to be at least 2 small central  tear/perforations. Associated fluid in the radioulnar joint space. Tendons: Significant tendinopathy and tenosynovitis involving the APL and EPB tendons consistent with de Quervain tenosynovitis. The APL in particular is thickened and demonstrates interstitial tears. There is also a small accessory tendon palmar to the APL. Mild tendinopathy involving the extensor carpi ulnaris. The other major dorsal and volar wrist tendons are intact. Carpal tunnel/median nerve: The carpal tunnel tendons appear normal.  No significant tendinopathy or tenosynovitis. No mass or mass effect. No ganglion cyst. The median nerve appears normal. Guyon's canal: Normal. Joint/cartilage: Moderate degenerative changes involving the wrist with numerous carpal cysts. No significant joint effusion or synovitis. There are also mild to moderate degenerative changes at the [facility] joint of the thumb. Bones/carpal alignment: No acute bony findings. No bone contusion, fracture or AVN. There is mild edema like signal abnormality in the radial styloid adjacent to the first dorsal compartment tendons, likely reactive change. Other: The wrist and hand musculature is grossly normal. IMPRESSION: 1. 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 Mild adjacent reactive marrow edema in the radial styloid. 2. Mild tendinopathy involving the extensor carpi ulnaris tendon. 3. Mild degenerative changes involving the wrist and CMC joint of the thumb with scattered degenerative carpal cysts. 4. Degenerated and torn TFCC. 5. The intercarpal ligaments appear intact. Electronically Signed   By: Marijo Sanes M.D.   On: 02/10/2019 08:21    Assessment & Plan:   Jamele was seen today for back pain.  Diagnoses and all orders for this visit:  Acute bilateral low back pain without sciatica- See below. -     etodolac (LODINE XL) 400 MG 24 hr tablet; Take 1 tablet (400 mg total) by mouth daily. 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  I recommended that he take a course of etodolac and methylprednisolone to reduce the pain and inflammation.  He will let me know if his symptoms do not resolve soon or if he develops any new or worsening symptoms. -     etodolac (LODINE XL) 400 MG 24 hr tablet; Take 1 tablet (400 mg total) by mouth daily. -     methylPREDNISolone (MEDROL DOSEPAK) 4 MG TBPK tablet; TAKE AS DIRECTED   I am having [employer]" start on etodolac and methylPREDNISolone. I am also having him maintain his tadalafil, cetirizine, Vitamin D, aspirin EC, Multiple Vitamin (MULTI-VITAMIN PO), ezetimibe, losartan, atorvastatin, and carvedilol.  Meds ordered this encounter  Medications  . etodolac (LODINE XL) 400 MG 24 hr tablet    Sig: Take 1 tablet (400 mg total) by mouth daily.    Dispense:  30 tablet    Refill:  1  . methylPREDNISolone (MEDROL DOSEPAK) 4 MG TBPK tablet    Sig: TAKE AS DIRECTED    Dispense:  21 tablet    Refill:  0     Follow-up: Return if symptoms worsen or fail to improve.  [name], MD

## 2019-06-15 ENCOUNTER — Other Ambulatory Visit: Payer: Self-pay | Admitting: Cardiovascular Disease

## 2019-06-15 ENCOUNTER — Other Ambulatory Visit: Payer: Self-pay | Admitting: Internal Medicine

## 2019-06-15 DIAGNOSIS — I10 Essential (primary) hypertension: Secondary | ICD-10-CM

## 2019-06-15 DIAGNOSIS — E785 Hyperlipidemia, unspecified: Secondary | ICD-10-CM

## 2019-06-30 ENCOUNTER — Ambulatory Visit (INDEPENDENT_AMBULATORY_CARE_PROVIDER_SITE_OTHER): Payer: PPO | Admitting: Internal Medicine

## 2019-06-30 ENCOUNTER — Other Ambulatory Visit: Payer: Self-pay

## 2019-06-30 ENCOUNTER — Encounter: Payer: Self-pay | Admitting: Internal Medicine

## 2019-06-30 VITALS — BP 130/80 | HR 64 | Temp 98.3°F | Ht 75.0 in | Wt 216.0 lb

## 2019-06-30 DIAGNOSIS — L03011 Cellulitis of right finger: Secondary | ICD-10-CM

## 2019-06-30 MED ORDER — SULFAMETHOXAZOLE-TRIMETHOPRIM 800-160 MG PO TABS: 2.0000 | ORAL_TABLET | Freq: Two times a day (BID) | ORAL | 0 refills | Status: AC

## 2019-06-30 NOTE — Patient Instructions (Signed)
Paronychia Paronychia is an infection of the skin that surrounds a nail. It usually affects the skin around a fingernail, but it may also occur near a toenail. It often causes pain and swelling around the nail. In some cases, a collection of pus (abscess) can form near or under the nail.  This condition may develop suddenly, or it may develop gradually over a longer period. In most cases, paronychia is not serious, and it will clear up with treatment. What are the causes? This condition may be caused by bacteria or a fungus. These germs can enter the body through an opening in the skin, such as a cut or a hangnail. What increases the risk? This condition is more likely to develop in people who:  Get their hands wet often, such as those who work as Designer, industrial/product, bartenders, or nurses.  Bite their fingernails or suck their thumbs.  Trim their nails very short.  Have hangnails or injured fingertips.  Get manicures.  Have diabetes. What are the signs or symptoms? Symptoms of this condition include:  Redness and swelling of the skin near the nail.  Tenderness around the nail when you touch the area.  Pus-filled bumps under the skin at the base and sides of the nail (cuticle).  Fluid or pus under the nail.  Throbbing pain in the area. How is this diagnosed? This condition is diagnosed with a physical exam. In some cases, a sample of pus may be tested to determine what type of bacteria or fungus is causing the condition. How is this treated? Treatment depends on the cause and severity of your condition. If your condition is mild, it may clear up on its own in a few days or after soaking in warm water. If needed, treatment may include:  Antibiotic medicine, if your infection is caused by bacteria.  Antifungal medicine, if your infection is caused by a fungus.  A procedure to drain pus from an abscess.  Anti-inflammatory medicine (corticosteroids). Follow these instructions at  home: Wound care  Keep the affected area clean.  Soak the affected area in warm water, if told to do so by your health care provider. You may be told to do this for 20 minutes, 2-3 times a day.  Keep the area dry when you are not soaking it.  Do not try to drain an abscess yourself.  Follow instructions from your health care provider about how to take care of the affected area. Make sure you: ? Wash your hands with soap and water before you change your bandage (dressing). If soap and water are not available, use hand sanitizer. ? Change your dressing as told by your health care provider.  If you had an abscess drained, check the area every day for signs of infection. Check for: ? Redness, swelling, or pain. ? Fluid or blood. ? Warmth. ? Pus or a bad smell. Medicines   Take over-the-counter and prescription medicines only as told by your health care provider.  If you were prescribed an antibiotic medicine, take it as told by your health care provider. Do not stop taking the antibiotic even if you start to feel better. General instructions  Avoid contact with harsh chemicals.  Do not pick at the affected area. Prevention  To prevent this condition from happening again: ? Wear rubber gloves when washing dishes or doing other tasks that require your hands to get wet. ? Wear gloves if your hands might come in contact with cleaners or other chemicals. ? Avoid  injuring your nails or fingertips. ? Do not bite your nails or tear hangnails. ? Do not cut your nails very short. ? Do not cut your cuticles. ? Use clean nail clippers or scissors when trimming nails. Contact a health care provider if:  Your symptoms get worse or do not improve with treatment.  You have continued or increased fluid, blood, or pus coming from the affected area.  Your finger or knuckle becomes swollen or difficult to move. Get help right away if you have:  A fever or chills.  Redness spreading away from  the affected area.  Joint or muscle pain. Summary  Paronychia is an infection of the skin that surrounds a nail. It often causes pain and swelling around the nail. In some cases, a collection of pus (abscess) can form near or under the nail.  This condition may be caused by bacteria or a fungus. These germs can enter the body through an opening in the skin, such as a cut or a hangnail.  If your condition is mild, it may clear up on its own in a few days. If needed, treatment may include medicine or a procedure to drain pus from an abscess.  To prevent this condition from happening again, wear gloves if doing tasks that require your hands to get wet or to come in contact with chemicals. Also avoid injuring your nails or fingertips. This information is not intended to replace advice given to you by your health care provider. Make sure you discuss any questions you have with your health care provider. Document Released: 12/20/2000 Document Revised: 07/13/2017 Document Reviewed: 07/09/2017 Elsevier Patient Education  2020 [employer]  

## 2019-06-30 NOTE — Progress Notes (Signed)
Subjective:  Patient ID: Clarence Ware, male    DOB: Jul 17, 1950  Age: 68 y.o. MRN: [id number]  CC: Hand Pain  This visit occurred during the SARS-CoV-2 public health emergency.  Safety protocols were in place, including screening questions prior to the visit, additional usage of staff PPE, and extensive cleaning of exam room while observing appropriate contact time as indicated for disinfecting solutions.   HPI Clarence Ware presents for a 1 week hx of worsening pain, redness, swelling in his RIF.  He denies fever chills  Outpatient Medications Prior to Visit  Medication Sig Dispense Refill  . aspirin EC 81 MG tablet Take 81 mg by mouth daily.    Marland Kitchen atorvastatin (LIPITOR) 40 MG tablet TAKE ONE TABLET BY MOUTH DAILY 58 tablet 1  . carvedilol (COREG) 3.125 MG tablet Take 1 tablet (3.125 mg total) by mouth 2 (two) times daily. 180 tablet 3  . cetirizine (ZYRTEC) 10 MG tablet Take 10 mg by mouth as directed.     . Cholecalciferol (VITAMIN D) 2000 units CAPS Take 1 capsule by mouth daily.    Marland Kitchen ezetimibe (ZETIA) 10 MG tablet TAKE ONE TABLET BY MOUTH DAILY 90 tablet 3  . losartan (COZAAR) 25 MG tablet TAKE ONE TABLET BY MOUTH DAILY 90 tablet 1  . Multiple Vitamin (MULTI-VITAMIN PO) Take 1 tablet by mouth.    . tadalafil (CIALIS) 20 MG tablet Use as directed. 6 tablet 11  . etodolac (LODINE XL) 400 MG 24 hr tablet Take 1 tablet (400 mg total) by mouth daily. 30 tablet 1   No facility-administered medications prior to visit.    ROS Review of Systems  Constitutional: Negative for chills and fever.  HENT: Negative.   All other systems reviewed and are negative.   Objective:  BP 130/80 (BP Location: Left Arm, Patient Position: Sitting, Cuff Size: Normal)   Pulse 64   Temp 98.3 F (36.8 C) (Oral)   Ht 6\' 3"  (1.905 m)   Wt 216 lb (98 kg)   SpO2 98%   BMI 27.00 kg/m   BP Readings from Last 3 Encounters:  06/30/19 130/80  05/26/19 120/82  05/22/19 124/76    Wt Readings  from Last 3 Encounters:  06/30/19 216 lb (98 kg)  05/26/19 216 lb (98 kg)  05/22/19 215 lb (97.5 kg)    Physical Exam Musculoskeletal:     Comments: RIF - radial side of the paronychia shows erythema, swelling, tenderness, SubQ pus. See photo.     Lab Results  Component Value Date   WBC 6.3 07/22/2018   HGB 14.7 07/22/2018   HCT 43.3 07/22/2018   PLT 209.0 07/22/2018   GLUCOSE 105 (H) 07/22/2018   CHOL 124 07/22/2018   TRIG 66.0 07/22/2018   HDL 41.40 07/22/2018   LDLCALC 70 07/22/2018   ALT 34 07/22/2018   AST 26 07/22/2018   NA 138 07/22/2018   K 4.7 07/22/2018   CL 103 07/22/2018   CREATININE 1.14 07/22/2018   BUN 20 07/22/2018   CO2 30 07/22/2018   TSH 1.87 07/22/2018   PSA 1.88 07/22/2018   HGBA1C 6.2 07/22/2018    MR WRIST RIGHT WO CONTRAST  Result Date: 02/10/2019 CLINICAL DATA:  Right wrist pain, weakness and swelling for 6 months. No specific injury. EXAM: MR OF THE RIGHT WRIST WITHOUT CONTRAST TECHNIQUE: Multiplanar, multisequence MR imaging of the right wrist was performed. No intravenous contrast was administered. COMPARISON:  Radiographs 11/22/2018 FINDINGS: Ligaments: The intercarpal ligaments are intact. The  intercarpal joint spaces are maintained. Triangular fibrocartilage: Degenerated and torn. There appear to be at least 2 small central tear/perforations. Associated fluid in the radioulnar joint space. Tendons: Significant tendinopathy and tenosynovitis involving the APL and EPB tendons consistent with de Quervain tenosynovitis. The APL in particular is thickened and demonstrates interstitial tears. There is also a small accessory tendon palmar to the APL. Mild tendinopathy involving the extensor carpi ulnaris. The other major dorsal and volar wrist tendons are intact. Carpal tunnel/median nerve: The carpal tunnel tendons appear normal. No significant tendinopathy or tenosynovitis. No mass or mass effect. No ganglion cyst. The median nerve appears normal.  Guyon's canal: Normal. Joint/cartilage: Moderate degenerative changes involving the wrist with numerous carpal cysts. No significant joint effusion or synovitis. There are also mild to moderate degenerative changes at the [facility] joint of the thumb. Bones/carpal alignment: No acute bony findings. No bone contusion, fracture or AVN. There is mild edema like signal abnormality in the radial styloid adjacent to the first dorsal compartment tendons, likely reactive change. Other: The wrist and hand musculature is grossly normal. IMPRESSION: 1. 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 Mild adjacent reactive marrow edema in the radial styloid. 2. Mild tendinopathy involving the extensor carpi ulnaris tendon. 3. Mild degenerative changes involving the wrist and CMC joint of the thumb with scattered degenerative carpal cysts. 4. Degenerated and torn TFCC. 5. The intercarpal ligaments appear intact. Electronically Signed   By: Marijo Sanes M.D.   On: 02/10/2019 08:21    After informed verbal consent was obtained, using Betadine for cleansing and 1% Lidocaine without epinephrine for anesthetia (1.5 cc used) and infiltrated just proximal to the affected area. With sterile technique an 11 blade was used to open the area of pus with a very small incision. A small amount of pus was expressed and obtained for culture. Hemostasis was obtained by pressure. The procedure was well tolerated without complications. Sensation and capillary refill are intact postprocedure.  Assessment & Plan:   Tou was seen today for hand pain.  Diagnoses and all orders for this visit:  Paronychia of right index finger- Incision and drainage successfully completed.  This is likely a staph infection.  Will treat with a 7-day course of high-dose Bactrim DS.  He will let me know if he develops any new or worsening symptoms.  I will change the antibiotic regimen if the culture results indicate the necessity to do so. -     Wound  culture; Future -     sulfamethoxazole-trimethoprim (BACTRIM DS) 800-160 MG tablet; Take 2 tablets by mouth 2 (two) times daily for 7 days.   I have discontinued Clarence Ware "[name]"'s etodolac. I am also having him start on sulfamethoxazole-trimethoprim. Additionally, I am having him maintain his tadalafil, cetirizine, Vitamin D, aspirin EC, Multiple Vitamin (MULTI-VITAMIN PO), ezetimibe, carvedilol, atorvastatin, and losartan.  Meds ordered this encounter  Medications  . sulfamethoxazole-trimethoprim (BACTRIM DS) 800-160 MG tablet    Sig: Take 2 tablets by mouth 2 (two) times daily for 7 days.    Dispense:  28 tablet    Refill:  0     Follow-up: Return in about 1 week (around 07/07/2019).  [name], MD

## 2019-07-03 ENCOUNTER — Encounter: Payer: Self-pay | Admitting: Internal Medicine

## 2019-07-03 LAB — WOUND CULTURE

## 2019-07-20 ENCOUNTER — Encounter: Payer: Self-pay | Admitting: Internal Medicine

## 2019-07-21 ENCOUNTER — Other Ambulatory Visit: Payer: Self-pay | Admitting: Internal Medicine

## 2019-07-21 DIAGNOSIS — R7301 Impaired fasting glucose: Secondary | ICD-10-CM

## 2019-07-21 DIAGNOSIS — I10 Essential (primary) hypertension: Secondary | ICD-10-CM

## 2019-07-21 DIAGNOSIS — E785 Hyperlipidemia, unspecified: Secondary | ICD-10-CM

## 2019-07-21 NOTE — Telephone Encounter (Signed)
That looks great Stef! Thanks for scheduling!

## 2019-07-22 ENCOUNTER — Encounter: Payer: Self-pay | Admitting: Internal Medicine

## 2019-07-22 ENCOUNTER — Other Ambulatory Visit: Payer: Self-pay

## 2019-07-22 ENCOUNTER — Other Ambulatory Visit (INDEPENDENT_AMBULATORY_CARE_PROVIDER_SITE_OTHER): Payer: PPO

## 2019-07-22 DIAGNOSIS — E785 Hyperlipidemia, unspecified: Secondary | ICD-10-CM | POA: Diagnosis not present

## 2019-07-22 DIAGNOSIS — I10 Essential (primary) hypertension: Secondary | ICD-10-CM

## 2019-07-22 DIAGNOSIS — R7301 Impaired fasting glucose: Secondary | ICD-10-CM | POA: Diagnosis not present

## 2019-07-22 LAB — HEPATIC FUNCTION PANEL
ALT: 34 U/L (ref 0–53)
AST: 31 U/L (ref 0–37)
Albumin: 4.3 g/dL (ref 3.5–5.2)
Alkaline Phosphatase: 84 U/L (ref 39–117)
Bilirubin, Direct: 0.2 mg/dL (ref 0.0–0.3)
Total Bilirubin: 0.7 mg/dL (ref 0.2–1.2)
Total Protein: 7 g/dL (ref 6.0–8.3)

## 2019-07-22 LAB — CBC WITH DIFFERENTIAL/PLATELET
Basophils Absolute: 0 10*3/uL (ref 0.0–0.1)
Basophils Relative: 0.5 % (ref 0.0–3.0)
Eosinophils Absolute: 0.4 10*3/uL (ref 0.0–0.7)
Eosinophils Relative: 7 % — ABNORMAL HIGH (ref 0.0–5.0)
HCT: 43 % (ref 39.0–52.0)
Hemoglobin: 14.5 g/dL (ref 13.0–17.0)
Lymphocytes Relative: 28.6 % (ref 12.0–46.0)
Lymphs Abs: 1.5 10*3/uL (ref 0.7–4.0)
MCHC: 33.7 g/dL (ref 30.0–36.0)
MCV: 90.7 fl (ref 78.0–100.0)
Monocytes Absolute: 0.7 10*3/uL (ref 0.1–1.0)
Monocytes Relative: 12.5 % — ABNORMAL HIGH (ref 3.0–12.0)
Neutro Abs: 2.7 10*3/uL (ref 1.4–7.7)
Neutrophils Relative %: 51.4 % (ref 43.0–77.0)
Platelets: 194 10*3/uL (ref 150.0–400.0)
RBC: 4.74 Mil/uL (ref 4.22–5.81)
RDW: 13.4 % (ref 11.5–15.5)
WBC: 5.3 10*3/uL (ref 4.0–10.5)

## 2019-07-22 LAB — LIPID PANEL
Cholesterol: 109 mg/dL (ref 0–200)
HDL: 41.7 mg/dL (ref >39.00)
LDL Cholesterol: 55 mg/dL (ref 0–99)
NonHDL: 67.05
Total CHOL/HDL Ratio: 3
Triglycerides: 60 mg/dL (ref 0.0–149.0)
VLDL: 12 mg/dL (ref 0.0–40.0)

## 2019-07-22 LAB — BASIC METABOLIC PANEL
BUN: 16 mg/dL (ref 6–23)
CO2: 29 mEq/L (ref 19–32)
Calcium: 9.9 mg/dL (ref 8.4–10.5)
Chloride: 103 mEq/L (ref 96–112)
Creatinine, Ser: 0.96 mg/dL (ref 0.40–1.50)
GFR: 77.65 mL/min (ref >60.00)
Glucose, Bld: 107 mg/dL — ABNORMAL HIGH (ref 70–99)
Potassium: 4.1 mEq/L (ref 3.5–5.1)
Sodium: 139 mEq/L (ref 135–145)

## 2019-07-22 LAB — TSH: TSH: 1.77 u[IU]/mL (ref 0.35–4.50)

## 2019-07-22 LAB — HEMOGLOBIN A1C: Hgb A1c MFr Bld: 6.2 % (ref 4.6–6.5)

## 2019-07-24 ENCOUNTER — Other Ambulatory Visit: Payer: Self-pay

## 2019-07-24 ENCOUNTER — Encounter: Payer: Self-pay | Admitting: Internal Medicine

## 2019-07-24 ENCOUNTER — Ambulatory Visit (INDEPENDENT_AMBULATORY_CARE_PROVIDER_SITE_OTHER): Payer: PPO | Admitting: Internal Medicine

## 2019-07-24 VITALS — BP 134/80 | HR 63 | Temp 98.7°F | Ht 75.0 in | Wt 211.0 lb

## 2019-07-24 DIAGNOSIS — M5126 Other intervertebral disc displacement, lumbar region: Secondary | ICD-10-CM

## 2019-07-24 DIAGNOSIS — Z0000 Encounter for general adult medical examination without abnormal findings: Secondary | ICD-10-CM

## 2019-07-24 DIAGNOSIS — E785 Hyperlipidemia, unspecified: Secondary | ICD-10-CM | POA: Diagnosis not present

## 2019-07-24 DIAGNOSIS — R7301 Impaired fasting glucose: Secondary | ICD-10-CM

## 2019-07-24 DIAGNOSIS — I10 Essential (primary) hypertension: Secondary | ICD-10-CM | POA: Diagnosis not present

## 2019-07-24 NOTE — Progress Notes (Signed)
Subjective:  Patient ID: Clarence Ware, male    DOB: [birth year]  Age: 69 y.o. MRN: [id number]  CC: Annual Exam and Hypertension   This visit occurred during the SARS-CoV-2 public health emergency.  Safety protocols were in place, including screening questions prior to the visit, additional usage of staff PPE, and extensive cleaning of exam room while observing appropriate contact time as indicated for disinfecting solutions.   HPI Clarence Ware presents for a CPX.  He has felt well recently and offers no complaints.  He walks 1 or 2 miles a day and does not experience chest pain, shortness of breath, dyspnea on exertion, palpitations, edema, or fatigue.  Outpatient Medications Prior to Visit  Medication Sig Dispense Refill  . aspirin EC 81 MG tablet Take 81 mg by mouth daily.    Marland Kitchen atorvastatin (LIPITOR) 40 MG tablet TAKE ONE TABLET BY MOUTH DAILY 58 tablet 1  . carvedilol (COREG) 3.125 MG tablet Take 1 tablet (3.125 mg total) by mouth 2 (two) times daily. 180 tablet 3  . cetirizine (ZYRTEC) 10 MG tablet Take 10 mg by mouth as directed.     . Cholecalciferol (VITAMIN D) 2000 units CAPS Take 1 capsule by mouth daily.    Marland Kitchen ezetimibe (ZETIA) 10 MG tablet TAKE ONE TABLET BY MOUTH DAILY 90 tablet 3  . losartan (COZAAR) 25 MG tablet TAKE ONE TABLET BY MOUTH DAILY 90 tablet 1  . Multiple Vitamin (MULTI-VITAMIN PO) Take 1 tablet by mouth.    . tadalafil (CIALIS) 20 MG tablet Use as directed. 6 tablet 11   No facility-administered medications prior to visit.    ROS Review of Systems  Constitutional: Negative for diaphoresis, fatigue and unexpected weight change.  HENT: Negative.   Eyes: Negative.   Respiratory: Negative for cough, chest tightness, shortness of breath and wheezing.   Cardiovascular: Negative for chest pain, palpitations and leg swelling.  Gastrointestinal: Negative for abdominal pain, constipation, diarrhea, nausea and vomiting.  Endocrine: Negative.     Genitourinary: Negative.  Negative for difficulty urinating.  Musculoskeletal: Negative.  Negative for myalgias.  Skin: Negative.  Negative for color change.  Neurological: Negative.  Negative for dizziness, weakness and light-headedness.  Hematological: Negative.   Psychiatric/Behavioral: Negative.     Objective:  BP 134/80 (BP Location: Left Arm, Patient Position: Sitting, Cuff Size: Normal)   Pulse 63   Temp 98.7 F (37.1 C) (Oral)   Ht 6\' 3"  (1.905 m)   Wt 211 lb (95.7 kg)   SpO2 90%   BMI 26.37 kg/m   BP Readings from Last 3 Encounters:  07/24/19 134/80  06/30/19 130/80  05/26/19 120/82    Wt Readings from Last 3 Encounters:  07/24/19 211 lb (95.7 kg)  06/30/19 216 lb (98 kg)  05/26/19 216 lb (98 kg)    Physical Exam Vitals reviewed.  Constitutional:      Appearance: Normal appearance.  HENT:     Nose: Nose normal.     Mouth/Throat:     Mouth: Mucous membranes are moist.  Eyes:     General: No scleral icterus.    Conjunctiva/sclera: Conjunctivae normal.  Cardiovascular:     Rate and Rhythm: Normal rate and regular rhythm.     Heart sounds: No murmur.  Pulmonary:     Effort: Pulmonary effort is normal.     Breath sounds: No stridor. No wheezing, rhonchi or rales.  Abdominal:     General: Abdomen is flat. Bowel sounds are normal. There is no distension.  Palpations: Abdomen is soft. There is no hepatomegaly or splenomegaly.     Tenderness: There is no abdominal tenderness.  Genitourinary:    Comments: GU and rectal exam deferred.  He prefers to have this done annually by his urologist. Musculoskeletal:        General: Normal range of motion.     Cervical back: Neck supple.     Right lower leg: No edema.     Left lower leg: No edema.  Lymphadenopathy:     Cervical: No cervical adenopathy.  Skin:    General: Skin is warm and dry.  Neurological:     General: No focal deficit present.     Mental Status: He is alert.  Psychiatric:        Mood and  Affect: Mood normal.        Behavior: Behavior normal.     Lab Results  Component Value Date   WBC 5.3 07/22/2019   HGB 14.5 07/22/2019   HCT 43.0 07/22/2019   PLT 194.0 07/22/2019   GLUCOSE 107 (H) 07/22/2019   CHOL 109 07/22/2019   TRIG 60.0 07/22/2019   HDL 41.70 07/22/2019   LDLCALC 55 07/22/2019   ALT 34 07/22/2019   AST 31 07/22/2019   NA 139 07/22/2019   K 4.1 07/22/2019   CL 103 07/22/2019   CREATININE 0.96 07/22/2019   BUN 16 07/22/2019   CO2 29 07/22/2019   TSH 1.77 07/22/2019   PSA 1.88 07/22/2018   HGBA1C 6.2 07/22/2019    MR WRIST RIGHT WO CONTRAST  Result Date: 02/10/2019 CLINICAL DATA:  Right wrist pain, weakness and swelling for 6 months. No specific injury. EXAM: MR OF THE RIGHT WRIST WITHOUT CONTRAST TECHNIQUE: Multiplanar, multisequence MR imaging of the right wrist was performed. No intravenous contrast was administered. COMPARISON:  Radiographs 11/22/2018 FINDINGS: Ligaments: The intercarpal ligaments are intact. The intercarpal joint spaces are maintained. Triangular fibrocartilage: Degenerated and torn. There appear to be at least 2 small central tear/perforations. Associated fluid in the radioulnar joint space. Tendons: Significant tendinopathy and tenosynovitis involving the APL and EPB tendons consistent with de Quervain tenosynovitis. The APL in particular is thickened and demonstrates interstitial tears. There is also a small accessory tendon palmar to the APL. Mild tendinopathy involving the extensor carpi ulnaris. The other major dorsal and volar wrist tendons are intact. Carpal tunnel/median nerve: The carpal tunnel tendons appear normal. No significant tendinopathy or tenosynovitis. No mass or mass effect. No ganglion cyst. The median nerve appears normal. Guyon's canal: Normal. Joint/cartilage: Moderate degenerative changes involving the wrist with numerous carpal cysts. No significant joint effusion or synovitis. There are also mild to moderate  degenerative changes at the [facility] joint of the thumb. Bones/carpal alignment: No acute bony findings. No bone contusion, fracture or AVN. There is mild edema like signal abnormality in the radial styloid adjacent to the first dorsal compartment tendons, likely reactive change. Other: The wrist and hand musculature is grossly normal. IMPRESSION: 1. 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 Mild adjacent reactive marrow edema in the radial styloid. 2. Mild tendinopathy involving the extensor carpi ulnaris tendon. 3. Mild degenerative changes involving the wrist and CMC joint of the thumb with scattered degenerative carpal cysts. 4. Degenerated and torn TFCC. 5. The intercarpal ligaments appear intact. Electronically Signed   By: Marijo Sanes M.D.   On: 02/10/2019 08:21    Assessment & Plan:   Trajon was seen today for annual exam and hypertension.  Diagnoses and all orders for  this visit:  Essential hypertension- His blood pressure is well controlled.  Recent electrolytes and renal function were normal.  Routine general medical examination at a health care facility- Exam completed, labs reviewed, vaccines reviewed and updated, cancer screenings are up-to-date.  Hyperlipidemia with target LDL less than 100- He has achieved his LDL goal and is doing well on the statin.  Lumbar disc herniation  Impaired fasting glucose- His A1c is at 6.2%.  He is prediabetic.  We had a discussion about whether or not he would start Metformin.  At this time he prefers not to start Metformin but will improve his lifestyle modifications and will return in 6 months for recheck of his A1c.   I am having Clarence Caprice Beaver "[name]" maintain his tadalafil, cetirizine, Vitamin D, aspirin EC, Multiple Vitamin (MULTI-VITAMIN PO), ezetimibe, carvedilol, atorvastatin, and losartan.  No orders of the defined types were placed in this encounter.    Follow-up: Return in about 6 months (around 01/21/2020).  [name], MD

## 2019-07-24 NOTE — Patient Instructions (Signed)

## 2019-09-08 ENCOUNTER — Telehealth: Payer: Self-pay | Admitting: Cardiovascular Disease

## 2019-09-08 NOTE — Telephone Encounter (Signed)
Called and made appt for June for pt 6 month f/u

## 2019-09-08 NOTE — Telephone Encounter (Signed)
Patient calling to request an in office appointment with Dr. Claiborne Billings in May for his 6 month follow up. He states he does not want an appointment with a PA or a virtual appointment.

## 2019-09-13 ENCOUNTER — Other Ambulatory Visit: Payer: Self-pay | Admitting: Cardiovascular Disease

## 2019-09-13 DIAGNOSIS — E785 Hyperlipidemia, unspecified: Secondary | ICD-10-CM

## 2019-10-27 DIAGNOSIS — N400 Enlarged prostate without lower urinary tract symptoms: Secondary | ICD-10-CM | POA: Diagnosis not present

## 2019-10-27 DIAGNOSIS — N5201 Erectile dysfunction due to arterial insufficiency: Secondary | ICD-10-CM | POA: Diagnosis not present

## 2019-11-26 ENCOUNTER — Encounter: Payer: Self-pay | Admitting: Internal Medicine

## 2019-11-26 ENCOUNTER — Ambulatory Visit (INDEPENDENT_AMBULATORY_CARE_PROVIDER_SITE_OTHER): Payer: PPO | Admitting: Internal Medicine

## 2019-11-26 ENCOUNTER — Other Ambulatory Visit: Payer: Self-pay

## 2019-11-26 VITALS — BP 128/82 | HR 62 | Temp 97.9°F | Resp 16 | Ht 75.0 in | Wt 194.2 lb

## 2019-11-26 DIAGNOSIS — I10 Essential (primary) hypertension: Secondary | ICD-10-CM

## 2019-11-26 DIAGNOSIS — G3281 Cerebellar ataxia in diseases classified elsewhere: Secondary | ICD-10-CM | POA: Diagnosis not present

## 2019-11-26 DIAGNOSIS — G4752 REM sleep behavior disorder: Secondary | ICD-10-CM | POA: Diagnosis not present

## 2019-11-26 DIAGNOSIS — R270 Ataxia, unspecified: Secondary | ICD-10-CM | POA: Insufficient documentation

## 2019-11-26 MED ORDER — MELATONIN 3 MG PO TABS: 6.0000 mg | ORAL_TABLET | Freq: Every day | ORAL | 1 refills | Status: DC

## 2019-11-26 NOTE — Progress Notes (Signed)
Subjective:  Patient ID: Clarence Ware, male    DOB: 09-04-1950  Age: 69 y.o. MRN: [id number]  CC: Gait Problem and Hypertension  This visit occurred during the SARS-CoV-2 public health emergency.  Safety protocols were in place, including screening questions prior to the visit, additional usage of staff PPE, and extensive cleaning of exam room while observing appropriate contact time as indicated for disinfecting solutions.    HPI Clarence Ware presents for f/up - He has a history of sleep apnea.  Over the last few years he has noticed worsening, abnormal behavior while he is in REM sleep.  He has occasionally knocked lamps off of a bed side table.  This recently worsened when he launched himself from the bed.  He says his wife has to sleep in another room.  He has also developed mild ataxia over the last few years.  Outpatient Medications Prior to Visit  Medication Sig Dispense Refill  . aspirin EC 81 MG tablet Take 81 mg by mouth daily.    Marland Kitchen atorvastatin (LIPITOR) 40 MG tablet TAKE ONE TABLET BY MOUTH DAILY 58 tablet 8  . carvedilol (COREG) 3.125 MG tablet Take 1 tablet (3.125 mg total) by mouth 2 (two) times daily. 180 tablet 3  . cetirizine (ZYRTEC) 10 MG tablet Take 10 mg by mouth as directed.     . Cholecalciferol (VITAMIN D) 2000 units CAPS Take 1 capsule by mouth daily.    Marland Kitchen ezetimibe (ZETIA) 10 MG tablet TAKE ONE TABLET BY MOUTH DAILY 90 tablet 3  . losartan (COZAAR) 25 MG tablet TAKE ONE TABLET BY MOUTH DAILY 90 tablet 1  . Multiple Vitamin (MULTI-VITAMIN PO) Take 1 tablet by mouth.    . tadalafil (CIALIS) 20 MG tablet Use as directed. 6 tablet 11   No facility-administered medications prior to visit.    ROS Review of Systems  Constitutional: Negative.  Negative for diaphoresis, fatigue and unexpected weight change.  HENT: Negative.   Eyes: Negative.   Respiratory: Positive for apnea. Negative for cough, chest tightness, shortness of breath and wheezing.     Cardiovascular: Negative for chest pain, palpitations and leg swelling.  Gastrointestinal: Negative for abdominal pain, constipation, diarrhea, nausea and vomiting.  Endocrine: Negative.   Genitourinary: Negative.  Negative for difficulty urinating.  Musculoskeletal: Positive for gait problem. Negative for arthralgias, back pain, myalgias and neck pain.  Skin: Negative for color change and pallor.  Neurological: Positive for dizziness and light-headedness (orthostatic). Negative for seizures, syncope, facial asymmetry, speech difficulty, weakness, numbness and headaches.  Hematological: Negative for adenopathy. Does not bruise/bleed easily.  Psychiatric/Behavioral: Positive for sleep disturbance. Negative for confusion, decreased concentration, dysphoric mood and suicidal ideas. The patient is not nervous/anxious.     Objective:  BP 128/82 (BP Location: Left Arm, Patient Position: Sitting, Cuff Size: Large)   Pulse 62   Temp 97.9 F (36.6 C) (Oral)   Resp 16   Ht 6\' 3"  (1.905 m)   Wt 194 lb 4 oz (88.1 kg)   SpO2 99%   BMI 24.28 kg/m   BP Readings from Last 3 Encounters:  11/26/19 128/82  07/24/19 134/80  06/30/19 130/80    Wt Readings from Last 3 Encounters:  11/26/19 194 lb 4 oz (88.1 kg)  07/24/19 211 lb (95.7 kg)  06/30/19 216 lb (98 kg)    Physical Exam Vitals reviewed.  Constitutional:      Appearance: Normal appearance.  HENT:     Nose: Nose normal.     Mouth/Throat:  Mouth: Mucous membranes are moist.  Eyes:     General: No scleral icterus.    Conjunctiva/sclera: Conjunctivae normal.  Cardiovascular:     Rate and Rhythm: Normal rate and regular rhythm.     Heart sounds: No murmur.  Pulmonary:     Effort: Pulmonary effort is normal.     Breath sounds: No stridor. No wheezing, rhonchi or rales.  Abdominal:     General: Abdomen is flat. Bowel sounds are normal.     Palpations: There is no hepatomegaly, splenomegaly or mass.     Tenderness: There is no  abdominal tenderness.  Musculoskeletal:        General: Normal range of motion.     Cervical back: Neck supple.     Right lower leg: No edema.     Left lower leg: No edema.  Lymphadenopathy:     Cervical: No cervical adenopathy.  Skin:    General: Skin is warm and dry.     Coloration: Skin is not pale.  Neurological:     General: No focal deficit present.     Mental Status: He is alert and oriented to person, place, and time. Mental status is at baseline.     Cranial Nerves: Cranial nerves are intact.     Sensory: Sensation is intact.     Motor: Motor function is intact. No weakness, tremor, atrophy or seizure activity.     Coordination: Coordination is intact. Romberg sign negative. Coordination normal. Heel to [facility] Test normal. Rapid alternating movements normal.     Gait: Gait is intact. Gait and tandem walk normal.     Deep Tendon Reflexes: Reflexes normal. Babinski sign absent on the right side. Babinski sign absent on the left side.     Reflex Scores:      Tricep reflexes are 1+ on the right side and 1+ on the left side.      Bicep reflexes are 1+ on the right side and 1+ on the left side.      Brachioradialis reflexes are 1+ on the right side and 1+ on the left side.      Patellar reflexes are 3+ on the right side and 3+ on the left side.      Achilles reflexes are 1+ on the right side and 1+ on the left side.    Lab Results  Component Value Date   WBC 5.3 07/22/2019   HGB 14.5 07/22/2019   HCT 43.0 07/22/2019   PLT 194.0 07/22/2019   GLUCOSE 107 (H) 07/22/2019   CHOL 109 07/22/2019   TRIG 60.0 07/22/2019   HDL 41.70 07/22/2019   LDLCALC 55 07/22/2019   ALT 34 07/22/2019   AST 31 07/22/2019   NA 139 07/22/2019   K 4.1 07/22/2019   CL 103 07/22/2019   CREATININE 0.96 07/22/2019   BUN 16 07/22/2019   CO2 29 07/22/2019   TSH 1.77 07/22/2019   PSA 1.88 07/22/2018   HGBA1C 6.2 07/22/2019    MR WRIST RIGHT WO CONTRAST  Result Date: 02/10/2019 CLINICAL DATA:   Right wrist pain, weakness and swelling for 6 months. No specific injury. EXAM: MR OF THE RIGHT WRIST WITHOUT CONTRAST TECHNIQUE: Multiplanar, multisequence MR imaging of the right wrist was performed. No intravenous contrast was administered. COMPARISON:  Radiographs 11/22/2018 FINDINGS: Ligaments: The intercarpal ligaments are intact. The intercarpal joint spaces are maintained. Triangular fibrocartilage: Degenerated and torn. There appear to be at least 2 small central tear/perforations. Associated fluid in the radioulnar joint space.  Tendons: Significant tendinopathy and tenosynovitis involving the APL and EPB tendons consistent with de Quervain tenosynovitis. The APL in particular is thickened and demonstrates interstitial tears. There is also a small accessory tendon palmar to the APL. Mild tendinopathy involving the extensor carpi ulnaris. The other major dorsal and volar wrist tendons are intact. Carpal tunnel/median nerve: The carpal tunnel tendons appear normal. No significant tendinopathy or tenosynovitis. No mass or mass effect. No ganglion cyst. The median nerve appears normal. Guyon's canal: Normal. Joint/cartilage: Moderate degenerative changes involving the wrist with numerous carpal cysts. No significant joint effusion or synovitis. There are also mild to moderate degenerative changes at the [facility] joint of the thumb. Bones/carpal alignment: No acute bony findings. No bone contusion, fracture or AVN. There is mild edema like signal abnormality in the radial styloid adjacent to the first dorsal compartment tendons, likely reactive change. Other: The wrist and hand musculature is grossly normal. IMPRESSION: 1. 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 Mild adjacent reactive marrow edema in the radial styloid. 2. Mild tendinopathy involving the extensor carpi ulnaris tendon. 3. Mild degenerative changes involving the wrist and CMC joint of the thumb with scattered degenerative carpal cysts.  4. Degenerated and torn TFCC. 5. The intercarpal ligaments appear intact. Electronically Signed   By: Marijo Sanes M.D.   On: 02/10/2019 08:21    Assessment & Plan:   Dmonte was seen today for gait problem and hypertension.  Diagnoses and all orders for this visit:  Ataxia- I have asked him to undergo an MRI to see if there is evidence of atrophy, NPH, CVA, or tumor. -     MR Brain Wo Contrast; Future  REM sleep behavior disorder- I recommended that he undergo an MRI to make sure there is not a structural lesion causing this.  Will start treating this with 6 mg of melatonin at bedtime.  I have also asked him to follow-up with his sleep doctor. -     melatonin 3 MG TABS tablet; Take 2 tablets (6 mg total) by mouth at bedtime. -     Ambulatory referral to Sleep Studies  Cerebellar ataxia in diseases classified elsewhere [facility]) -     MR Brain Wo Contrast; Future  Essential hypertension- His blood pressure is well controlled but he complains of dizziness and orthostatic lightheadedness.  We discussed discontinuation of the ARB but he wants to discuss this with his cardiologist whom he sees pretty soon.  For now will continue the current antihypertensive regimen.   I am having Kavari Caprice Beaver "[name]" start on melatonin. I am also having him maintain his tadalafil, cetirizine, Vitamin D, aspirin EC, Multiple Vitamin (MULTI-VITAMIN PO), ezetimibe, carvedilol, losartan, and atorvastatin.  Meds ordered this encounter  Medications  . melatonin 3 MG TABS tablet    Sig: Take 2 tablets (6 mg total) by mouth at bedtime.    Dispense:  180 tablet    Refill:  1     Follow-up: Return in about 3 months (around 02/26/2020).  [name], MD

## 2019-11-26 NOTE — Patient Instructions (Signed)
Ataxia  Ataxia is a condition that causes unsteadiness when walking and standing, poor coordination of body movements, and difficulty keeping a straight (upright) posture. It occurs because of a problem with the part of the brain that controls coordination and stability (cerebellum). Ataxia can develop later in life (acquired ataxia), during your 20s or 30s or even into your 60s or later. This type of ataxia develops when another medical condition, such as a stroke, damages the cerebellum. Ataxia also may be present early in life (non-acquired ataxia). There are two main types of non-acquired ataxia:  Congenital. This type is present at birth.  Hereditary. This type is passed from parent to child. The most common form of hereditary non-acquired ataxia is Friedreich ataxia. What are the causes? Acquired ataxia may be caused by:  Changes in the nervous system (neurodegenerative changes).  Changes throughout the body (systemic disorders).  A lot of exposure to: ? Certain medicines such as phenytoin and lithium. ? Solvents. These are cleaning fluids such as paint thinner, nail polish remover, carpet cleaner, and degreasers.  Alcohol abuse (alcoholism).  Medical conditions, such as: ? Celiac disease. ? Hypothyroidism. ? A lack (deficiency) of vitamin E, vitamin B12, or thiamine. ? Brain tumors. ? Multiple sclerosis. ? Cerebral palsy. ? Stroke. ? Paraneoplastic syndromes. ? Viral infections. ? Head injury. ? Malnutrition. Congenital and hereditary ataxia are caused by problems that are present in genes before birth. What are the signs or symptoms? Signs and symptoms of ataxia vary depending on the cause. They may include:  Being unsteady.  Walking with the legs wide apart (wide stance) to keep one's balance.  Uncontrolled shaking (tremor).  Poorly coordinated body movements.  Difficulty maintaining an upright posture.  Fatigue.  Changes in speech.  Changes in  vision.  Involuntary eye movements (nystagmus).  Difficulty swallowing.  Difficulty writing.  Muscle tightening that you cannot control (muscle spasms). How is this diagnosed? Ataxia may be diagnosed based on:  Your personal and family medical history.  A physical exam.  Imaging tests, such as a CT scan or MRI.  Spinal tap (lumbar puncture). This procedure involves using a needle to take a sample of the fluid around your brain and spinal cord.  Genetic testing. How is this treated? The underlying condition that causes your ataxia needs to be treated. If the cause is a brain tumor, you may need surgery. Treatment also focuses on helping you live with ataxia and improving your quality of life (supportive treatments). This may involve:  Learning ways to improve coordination and move around more carefully (physical therapy).  Learning ways to improve your ability to do daily tasks, such as bathing and feeding yourself (occupational therapy).  Using devices to help you move around, eat, or communicate (assistive devices), such as a walker, modified eating utensils, and communication aids.  Learning ways to improve speech and swallowing (speech therapy). Follow these instructions at home: Preventing falls  Lie down right away if you become very unsteady, dizzy, or nauseous, or if you feel like you are going to faint. Do not get up until all of those feelings pass.  Keep your home well-lit. Use night-lights as needed.  Remove tripping hazards, such as rugs, cords, and clutter.  Install grab bars by the toilet and in the tub and shower.  Use assistive devices such as a cane, walker, or wheelchair as needed to keep your balance. General instructions  Do not drink alcohol.  Ask your health care provider what activities are safe  for you, and what activities you should avoid.  Take over-the-counter and prescription medicines only as told by your health care provider. Get help  right away if you:  Have unsteadiness that suddenly worsens.  Have any of these: ? Severe headaches. ? Chest pain. ? Abdominal pain. ? Weakness or numbness on one side of your body. ? Vision problems. ? Difficulty speaking. ? An irregular heartbeat. ? A very fast pulse.  Feel confused. Summary  Ataxia is a condition that causes unsteadiness when walking and standing, poor coordination of body movements, and difficulty keeping a straight (upright) posture.  Ataxia occurs because of a problem with the part of the brain that controls coordination and stability (cerebellum).  The underlying condition that causes your ataxia needs to be treated. Treatment also focuses on helping you live with ataxia and improving your quality of life (supportive treatments).  Lie down right away if you become very unsteady, dizzy, or nauseous, or if you feel like you are going to faint. This information is not intended to replace advice given to you by your health care provider. Make sure you discuss any questions you have with your health care provider. Document Revised: 06/08/2017 Document Reviewed: 04/27/2017 Elsevier Patient Education  2020 [employer]  

## 2019-12-11 ENCOUNTER — Telehealth: Payer: Self-pay | Admitting: Neurology

## 2019-12-11 ENCOUNTER — Other Ambulatory Visit: Payer: Self-pay | Admitting: Cardiovascular Disease

## 2019-12-11 ENCOUNTER — Telehealth: Payer: Self-pay | Admitting: Internal Medicine

## 2019-12-11 ENCOUNTER — Ambulatory Visit: Payer: PPO | Admitting: Neurology

## 2019-12-11 ENCOUNTER — Telehealth: Payer: Self-pay | Admitting: *Deleted

## 2019-12-11 ENCOUNTER — Other Ambulatory Visit: Payer: Self-pay | Admitting: Internal Medicine

## 2019-12-11 ENCOUNTER — Encounter: Payer: Self-pay | Admitting: Neurology

## 2019-12-11 ENCOUNTER — Other Ambulatory Visit: Payer: Self-pay

## 2019-12-11 VITALS — BP 139/84 | HR 56 | Resp 16 | Ht 74.0 in | Wt 193.0 lb

## 2019-12-11 DIAGNOSIS — G44029 Chronic cluster headache, not intractable: Secondary | ICD-10-CM | POA: Diagnosis not present

## 2019-12-11 DIAGNOSIS — G4752 REM sleep behavior disorder: Secondary | ICD-10-CM

## 2019-12-11 DIAGNOSIS — I440 Atrioventricular block, first degree: Secondary | ICD-10-CM | POA: Diagnosis not present

## 2019-12-11 DIAGNOSIS — I10 Essential (primary) hypertension: Secondary | ICD-10-CM

## 2019-12-11 NOTE — Telephone Encounter (Signed)
Per Dr Brett Fairy, called PCP office, spoke with Gearlean Alf and advised her Dr Brett Fairy has ordered MRI brain with /without contrast. She requested that PCP cancel his MRI brain without contrast order. Gearlean Alf stated she will send note to nurse. Patient verbalized understanding, appreciation.

## 2019-12-11 NOTE — Addendum Note (Signed)
Addended by: [name] on: 12/11/2019 10:42 AM   Modules accepted: Orders

## 2019-12-11 NOTE — Addendum Note (Signed)
Addended by: Larey Seat on: 12/11/2019 09:52 AM   Modules accepted: Orders

## 2019-12-11 NOTE — Telephone Encounter (Signed)
health team order sent to GI. No auth they will reach out to the patient to schedule.  

## 2019-12-11 NOTE — Patient Instructions (Signed)

## 2019-12-11 NOTE — Telephone Encounter (Signed)
Mary with [facility] called this morning and was wondering if Dr. Ronnald Ramp could cancel the order for the patient to have an MRI with out contrast set for January 13, 2020 because they needed one with contrast and the order for that MRI was placed today by Dr. Brett Fairy.

## 2019-12-11 NOTE — Progress Notes (Signed)
SLEEP MEDICINE CLINIC   Provider:  Larey Ware, M D  Referring Provider: [name], MD Primary Care Physician:  Clarence Lima, MD  Chief Complaint  Patient presents with  . Sleep Consult    rm 11    HPI:  Clarence Ware is a 69 y.o. male , he works as a Software engineer . He was seen here as a referral from Dr. Ronnald Ware for for a sleep consultation for the first time in 10-2015. Now he is here for a different concern , he lost weight, he was considered pre-diabetic, and his snoring is less. He is using a mouth piece made to measure by Dr. Ron Ware. No longer wakes up with headaches when wearing his mouth piece.     Chief complaint according to patient :" Recently the enactment of dreams has become a more prominent feature of his sleep, and of this year he had fallen out of bed dove out of bed as reported also broke nightstand lamp in the process.  REM sleep behavior was not a feature rehab explored when we first met over 4 years ago so it is a fairly recent event.  [name] has respiratory allergies and of hyperlipidemia and hypertension, pre diabetes- all improved with weight loss.  he has no history of morbid obesity, and no history of shift work.  Sleep habits are as follows: Patient usually goes to bed around 9-9.30 PM and rises at 7 AM.  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  He usually falls asleep promptly after going to bed, he likes to read in bed, he sleeps on the side when he falls asleep but during the night resume supine position -he snores much less after weight loss and with mouth guard. . He sleeps on a flat bed, on 2 pillows. He does not watch TV in the bedroom, neither does his wife. He may have one bathroom break at night, rarely 2.  The dream enactment is around midnight or later-  This means he will get about 9 hours of sleep at night. He uses a phone- alarm to wake up in the morning at about 6 -7 AM, never uses the snooze button.  Many  mornings he is just awake before the alarm rings. No longer has a  dry, parched mouth or sore throat.   Sleep medical history and family sleep history: The patient is unaware of any close family members that may have a sleep disorder or may be treated for sleep disorder. Mother had Picks frontal lobe dementia.   Social history: Clarence Ware is married, lives with his wife, he no longer smokes and no longer takes pain medication.  2 grown children . No grandchildren.  Wife works part time.   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 He may drink another diet soda with caffeine during the day.  Again he no longer smokes or uses alcohol.   Review of Systems: Out of a complete 14 system review, the patient complains of only the following symptoms, and all other reviewed systems are negative. REM BD- yelling, screaming and has fallen out of bed, has bruised his right shoulder . Rarely has he woken himself.   How likely are you to doze in the following situations: 0 = not likely, 1 = slight chance, 2 = moderate chance, 3 =  high chance  Sitting and Reading? Watching Television? Sitting inactive in a public place (theater or meeting)? Lying down in the afternoon when circumstances permit? Sitting and talking to someone? Sitting quietly after lunch without alcohol? In a car, while stopped for a few minutes in traffic? As a passenger in a car for an hour without a break?  Total = 5    Epworth score 5 , Fatigue severity score 31 , depression score 2.   Social History   Socioeconomic History  . Marital status: Married    Spouse name: Clarence Ware  . Number of children: 2  . Years of education: Not on file  . Highest education level: Not on file  Occupational History  . Occupation: Software engineer part-time  Tobacco Use  . Smoking status: Former Smoker     Packs/day: 0.25    Years: 1.00    Pack years: 0.25    Quit date: 07/10/1992    Years since quitting: 27.4  . Smokeless tobacco: Never Used  Substance and Sexual Activity  . Alcohol use: No    Alcohol/week: 0.0 standard drinks  . Drug use: Not Currently    Comment: sobrity for 10 years  . Sexual activity: Yes  Other Topics Concern  . Not on file  Social History Narrative   Married. Education: [employer].   Exercise: some   Caffeine- coffee 2 c daily   Social Determinants of Health   Financial Resource Strain:   . Difficulty of Paying Living Expenses:   Food Insecurity:   . Worried About Charity fundraiser in the Last Year:   . Arboriculturist in the Last Year:   Transportation Needs:   . Film/video editor (Medical):   Clarence Ware Kitchen Lack of Transportation (Non-Medical):   Physical Activity:   . Days of Exercise per Week:   . Minutes of Exercise per Session:   Stress:   . Feeling of Stress :   Social Connections:   . Frequency of Communication with Friends and Family:   . Frequency of Social Gatherings with Friends and Family:   . Attends Religious Services:   . Active Member of Clubs or Organizations:   . Attends Archivist Meetings:   Clarence Ware Kitchen Marital Status:   Intimate Partner Violence:   . Fear of Current or Ex-Partner:   . Emotionally Abused:   Clarence Ware Kitchen Physically Abused:   . Sexually Abused:     Family History  Problem Relation Age of Onset  . Heart disease Mother   . Stroke Mother   . Heart disease Father 21       MI  . Hypertension Father   . Hypertension Brother   . Cancer Brother   . Diabetes Brother   . Heart disease Brother   . Hyperlipidemia Brother   . Cancer Brother   . Hyperlipidemia Sister   . Coronary artery disease Brother 98       CABG  . Colon cancer Paternal Grandmother     Past Medical History:  Diagnosis Date  . Allergy   . First degree heart block   . Hyperlipidemia   . Hypertension   . REM sleep behavior disorder   . Substance abuse  [facility])     Past Surgical History:  Procedure Laterality Date  . INGUINAL HERNIA REPAIR Right 03/10/2015   Procedure: OPEN RIGHT INGUINAL HERNIA REPAIR;  Surgeon: Johnathan Hausen, MD;  Location: [facility];  Service: General;  Laterality: Right;  With MESH  . VASECTOMY  Current Outpatient Medications  Medication Sig Dispense Refill  . aspirin EC 81 MG tablet Take 81 mg by mouth daily.    Clarence Ware Kitchen atorvastatin (LIPITOR) 40 MG tablet TAKE ONE TABLET BY MOUTH DAILY 58 tablet 8  . carvedilol (COREG) 3.125 MG tablet Take 1 tablet (3.125 mg total) by mouth 2 (two) times daily. 180 tablet 3  . cetirizine (ZYRTEC) 10 MG tablet Take 10 mg by mouth as directed.     . Cholecalciferol (VITAMIN D) 2000 units CAPS Take 1 capsule by mouth daily.    Clarence Ware Kitchen ezetimibe (ZETIA) 10 MG tablet TAKE ONE TABLET BY MOUTH DAILY 90 tablet 3  . losartan (COZAAR) 25 MG tablet TAKE ONE TABLET BY MOUTH DAILY 90 tablet 1  . melatonin 3 MG TABS tablet Take 2 tablets (6 mg total) by mouth at bedtime. 180 tablet 1  . Multiple Vitamin (MULTI-VITAMIN PO) Take 1 tablet by mouth.    . tadalafil (CIALIS) 20 MG tablet Use as directed. 6 tablet 11   No current facility-administered medications for this visit.    Allergies as of 12/11/2019 - Review Complete 12/11/2019  Allergen Reaction Noted  . Nexium [esomeprazole magnesium]  03/14/2013    Vitals: BP 139/84   Pulse (!) 56   Resp 16   Ht '6\' 2"'$  (1.88 m)   Wt 193 lb (87.5 kg)   BMI 24.78 kg/m  Last Weight:  Wt Readings from Last 1 Encounters:  12/11/19 193 lb (87.5 kg)   RCV:ELFY mass index is 24.78 kg/m.     Last Height:   Ht Readings from Last 1 Encounters:  12/11/19 '6\' 2"'$  (1.88 m)    Physical exam:  General: The patient is awake, alert and appears not in acute distress.  The patient is well groomed. He is much slimmer than in 2017.   Head: Normocephalic, atraumatic. Neck is supple. Mallampati 3- tip of the uvula behind the tongue ground.  There is no longer evidence of a  scalloped tongue/  neck circumference now 16. 25"  . Nasal airflow restricted ,  he had deviated septum surgery. TMJ not  evident . Retrognathia is not seen.  Full facial hair.  Cardiovascular:  Regular rate and rhythm, without  murmurs or carotid bruit, and without distended neck veins. Respiratory: Lungs are clear to auscultation. Skin:  Without evidence of edema, or rash Trunk: BMI is 24 kg/ m2 from 28. The patient's posture is erect  Neurologic exam : The patient is awake and alert, oriented to place and time.   Memory subjective  described as intact.   Attention span & concentration ability appears normal.  Speech is fluent,  without dysarthria, dysphonia or aphasia.  Mood and affect are appropriate.  Cranial nerves: no loss of smell or taste.  Pupils are equal and briskly reactive to light. Funduscopic exam deferred.  Extraocular movements  in vertical and horizontal planes intact and without nystagmus. Visual fields by finger perimetry are intact. Hearing to finger rub intact- wearing hearing aids in both ears. Facial sensation intact to fine touch.   Facial motor strength is symmetric and tongue and uvula move midline. Shoulder shrug was symmetrical.   Motor exam:  Normal tone, muscle bulk and symmetric strength in all extremities. Loose wrist, fully rleaxed biceps , no cogwheeling. Slightly more tone on his right shoulder , related to ROM Sensory:  Fine touch and vibration were tested in all extremities.  Proprioception tested in the upper extremities was normal. Coordination:  Reports no changes in  penmanship. Rapid alternating movements in the fingers/hands intact. Finger-to-nose maneuver  normal without evidence of ataxia, dysmetria or tremor. Gait and station: Patient walks without assistive device and is able unassisted to climb up to the exam table. Strength within normal limits.  Stance is stable and normal.   Deep tendon reflexes: in the upper and lower extremities are  symmetric and intact. Babinski maneuver deferred.   The patient was advised of the nature of the diagnosed sleep disorder , the treatment options and risks for general a health and wellness arising from not treating the condition.  I spent more than 35 minutes of face to face time with the patient. Greater than 50% of time was spent in counseling and coordination of care. We have discussed the diagnosis and differential and I answered the patient's questions.    Assessment:  After physical and neurologic examination, review of laboratory studies,  Personal review of imaging studies, reports of other /same  Imaging studies ,  Results of polysomnography/ neurophysiology testing and pre-existing records as far as provided in visit., my assessment is   1)  UARS - uses a dental device ( made to measure with Dr Clarence Ware). The patient has gained control of his UARS, snoring and very mild apnea, AHI was 2.5/h in 5/ 2017 .   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 Usually stays in bed , thrashes and yells, but recently has fallen out of bed. Melatonin was initiated most recently 3 weeks ago, but he still yells and screams, has not fallen out of bed since.     Plan:  Treatment plan and additional workup :  REM BD montage in PSG, attended study.  Starting Klonopin if Melatonin fails to control -  he has a substance abuse history and would not like to start this medication, for obvious reasons.  Would be preferable to use an SSRI for REM suppression .  No sign of PD, and no sign of Lewy body dementia.  MRI was already scheduled for JULY 2021 by Dr Clarence Ware. Asencion Partridge Lillyahna Hemberger MD  12/11/2019   CC: Clarence Ware, [place],  [place] [id number]

## 2019-12-15 ENCOUNTER — Encounter: Payer: Self-pay | Admitting: Cardiovascular Disease

## 2019-12-15 ENCOUNTER — Other Ambulatory Visit: Payer: Self-pay

## 2019-12-15 ENCOUNTER — Ambulatory Visit: Payer: PPO | Admitting: Cardiovascular Disease

## 2019-12-15 ENCOUNTER — Ambulatory Visit
Admission: RE | Admit: 2019-12-15 | Discharge: 2019-12-15 | Disposition: A | Discharge status: Home / Self Care | Payer: PPO | Source: Ambulatory Visit | Attending: Neurology | Admitting: Neurology

## 2019-12-15 VITALS — BP 118/76 | HR 64 | Ht 74.0 in | Wt 189.8 lb

## 2019-12-15 DIAGNOSIS — G44029 Chronic cluster headache, not intractable: Secondary | ICD-10-CM

## 2019-12-15 DIAGNOSIS — R001 Bradycardia, unspecified: Secondary | ICD-10-CM | POA: Diagnosis not present

## 2019-12-15 DIAGNOSIS — I440 Atrioventricular block, first degree: Secondary | ICD-10-CM | POA: Diagnosis not present

## 2019-12-15 DIAGNOSIS — E785 Hyperlipidemia, unspecified: Secondary | ICD-10-CM | POA: Diagnosis not present

## 2019-12-15 DIAGNOSIS — G4752 REM sleep behavior disorder: Secondary | ICD-10-CM | POA: Diagnosis not present

## 2019-12-15 DIAGNOSIS — I10 Essential (primary) hypertension: Secondary | ICD-10-CM

## 2019-12-15 MED ORDER — GADOBENATE DIMEGLUMINE 529 MG/ML IV SOLN
17.0000 mL | Freq: Once | INTRAVENOUS | Status: AC | PRN
  Administered 2019-12-15: 17 mL via INTRAVENOUS

## 2019-12-15 NOTE — Progress Notes (Signed)
Patient ID: Clarence Ware, male   DOB: 04-10-1951, 69 y.o.   MRN: [id number]    Primary M.D.: Dr. Ivar Bury  HPI: Clarence Ware is a 69 y.o. male presents to the office today for a 7 month followup cardiology evaluation.  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  Recently, another brother has a chronic aortic dissection for which she's been treated medically.  Clarence Ware has a history of hyperlipidemia and hypertension.  Over 10 years ago he had a normal routine treadmill test. The patient has continued to exercise regularly. There is a prior history of substance abuse for which he did undergo treatment.  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  He  has a history of hyperlipidemia, erectile dysfunction, a history of intermittent headaches.  When I saw him on 11/07/2012, we discussed the sensitivity and specificity of routine treadmill testing with a strong family history I elected to schedule him for a cardiopulmonary met test for further evaluation would also be helpful to assess endothelial dysfunction/diastolic dysfunction evaluate potential for microvascular angina. He also had a soft cardiac murmur at his apex and recommended a 2-D echo Doppler study.  His cardiopulmonary met test revealed that he had good functional capacity with an excellent maximum oxygen consumption peak of 92% of predicted. However, he had a abnormal response during the last several minutes of exercise suggesting ischemic myocardial dysfunction with decreasing stroke volume with increasing work rate which corresponded to an abrupt decrease in cardiac output suggestive of exercise-induced myocardial dysfunction. He did have very mild ventilation/perfusion mismatch  with reference to his pulmonary status.  A 2-D echo Doppler study showed normal systolic function with an ejection fraction of 55-60%. He had normal diastolic parameters.  When I saw him in followup of his noninvasive studies in June 2014 due to the mild abnormality noted during the last 7 minutes of exercise I recommended the addition of ARB therapy and further titrated his atorvastatin for aggressive lipid management. He has tolerated initiation of losartan.  I last saw him on most 2 years ago.  He denied any  chest pain or palpitations. He He did have a followup NMR lipoprotein of which showed an LDL particle number of 1216 with a calculated LDL of 86, HDL 46, triglycerides 58, total cholesterol 144. He had 552 small LDL some particles. HDL particle number was reduced at 29.1. Insulin resistance score was normal at 33.  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  However, he is still walking at least 3 days per week for a minimum of 30 minutes.  He underwent a sleep study at [facility] sleep at [facility] on 05/03/201 and was not found to have significant obstructive sleep apnea and his AHI and RDI were both 2.9.  He has significant periodic limb movements of sleep and had snoring.  He tells me he had a mouth piece customized by Dr. Oneal Grout for treatment of his snoring.   I saw him in January 2019.  He denies any episodes of chest pain or shortness of breath.  He is unaware of palpitations.  He uses his mouthguard  to sleep and denies any awareness of breakthrough snoring.  He continues to be on losartan 25 mg daily, and metoprolol succinate 25 mg daily for hypertension.  He has continued to be on atorvastatin 20 mg.  Of note, laboratory 6 months ago showed an LDL cholesterol, which had risen up to 90.  Diet evaluation I further titrated atorvastatin to 40 mg.  Over the last 6 months he has continued to  remain stable.  He underwent repeat laboratory on his increased atorvastatin which had not significantly changed.  Total cholesterol was 150, triglycerides 63, HDL 48, LDL 89.  He denies chest pain PND orthopnea.  He denies shortness of breath.  He has noticed some rare dizziness particularly when he bends over.    He was  evaluated in a telemedicine visit on October 11, 2018.  Prior to his evaluation in June 2019 he had self reduced his metoprolol succinate from 25 mg down to 12.5 mg due to noticing that his heart rate was morning somewhat slow.  He started to notice that particularly following exertion his blood pressure at times have gotten low and he has recorded blood pressure levels of 114/71 with a pulse of 67, 106/68 with a pulse of 65, 95/60 with a pulse of 63 and on 1 occasion systolic blood pressure had dropped to as low as 88.  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  He denied any episodes of chest pain.  He was taking losartan 25 mg daily in addition to Toprol-XL 12.5 mg daily and was on atorvastatin 40 mg and Zetia 10 mg for hyperlipidemia.  In January 2020 LDL cholesterol was 70.  He continues to take aspirin.  He admits to some fatigue associated with bradycardia.  He was using a customized oral appliance for his mild sleep apnea and followed by Dr. Oneal Grout for his snoring/sleep apnea.  Since I last saw him, he has been having significant dreaming and falling out of bed diving due to potential rem related sleep disorder.  He ultimately was referred to Dr. Asencion Partridge Dohmeier and apparently underwent an MRI today for assessment.  He is aware of the association of potential future Parkinson disease or Lewy body disorder associated with rem behavior parasomnias.  He does not know the results of his MRI imaging.  He  denies chest pain or palpitations.  Sometimes if he stands abruptly he may note mild dizzy spells but this has improved with discontinuance of atenolol and switching to carvedilol.    Past Medical History:  Diagnosis Date  . Allergy   . First degree heart block   . Hyperlipidemia   . Hypertension   . REM sleep behavior disorder   . Substance abuse [facility])     Past Surgical History:  Procedure Laterality Date  . INGUINAL HERNIA REPAIR Right 03/10/2015   Procedure: OPEN RIGHT INGUINAL HERNIA REPAIR;  Surgeon: Johnathan Hausen, MD;  Location: [facility];  Service: General;  Laterality: Right;  With MESH  . VASECTOMY      Allergies  Allergen Reactions  . Nexium [Esomeprazole Magnesium]     Awful regurgitation    Current Outpatient Medications  Medication Sig Dispense Refill  . aspirin EC 81 MG tablet Take 81 mg by mouth daily.    Marland Kitchen atorvastatin (LIPITOR)  40 MG tablet TAKE ONE TABLET BY MOUTH DAILY 58 tablet 8  . cetirizine (ZYRTEC) 10 MG tablet Take 10 mg by mouth as directed.     . Cholecalciferol (VITAMIN D) 2000 units CAPS Take 1 capsule by mouth daily.    Marland Kitchen ezetimibe (ZETIA) 10 MG tablet TAKE ONE TABLET BY MOUTH DAILY 90 tablet 0  . losartan (COZAAR) 25 MG tablet TAKE ONE TABLET BY MOUTH DAILY 90 tablet 0  . melatonin 3 MG TABS tablet Take 2 tablets (6 mg total) by mouth at bedtime. 180 tablet 1  . Multiple Vitamin (MULTI-VITAMIN PO) Take 1 tablet by mouth.    . tadalafil (CIALIS) 20 MG tablet Use as directed. 6 tablet 11   No current facility-administered medications for this visit.    Socially he is married has 2 children no grandchildren. He does exercise at least 30 minutes at a time. There is a prior history of tobacco use but he quit over 3 years ago and a prior use of opiates. 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  ROS General: Negative; No fevers, chills, or night sweats;  HEENT: Negative;  No changes in vision or hearing, sinus congestion, difficulty swallowing Pulmonary: Negative; No cough, wheezing, shortness of breath, hemoptysis Cardiovascular: See history of present illness GI: Negative; No nausea, vomiting, diarrhea, or abdominal pain GU: Negative; No dysuria, hematuria, or difficulty voiding Musculoskeletal: Negative; no myalgias, joint pain, or weakness Hematologic/Oncology: Negative; no easy bruising, bleeding Endocrine: Negative; no heat/cold intolerance; no diabetes Neuro: Negative; no changes in balance, headaches Skin: Negative; No rashes or skin lesions Psychiatric: Negative; No behavioral problems, depression Sleep: Sleep study negative for OSA but suspicious for increased upper airway resistance.  He did have periodic limb movements that were frequent.  No hypnogognic hallucinations, no cataplexy Other comprehensive 14 point system review is negative.  PE BP 118/76   Pulse 64   Ht '6\' 2"'$  (1.88 m)   Wt 189 lb 12.8 oz (86.1 kg)   BMI 24.37 kg/m    Repeat blood pressure by me was 126/70 supine and 106/68 standing  Wt Readings from Last 3 Encounters:  12/15/19 189 lb 12.8 oz (86.1 kg)  12/11/19 193 lb (87.5 kg)  11/26/19 194 lb 4 oz (88.1 kg)   General: Alert, oriented, no distress.  Skin: normal turgor, no rashes, warm and dry HEENT: Normocephalic, atraumatic. Pupils equal round and reactive to light; sclera anicteric; extraocular muscles intact;  Nose without nasal septal hypertrophy Mouth/Parynx benign; Mallinpatti scale 3 Neck: No JVD, no carotid bruits; normal carotid upstroke Lungs: clear to ausculatation and percussion; no wheezing or rales Chest wall: without tenderness to palpitation Heart: PMI not displaced, RRR, s1 s2 normal, 1/6 systolic murmur, no diastolic murmur, no rubs, gallops, thrills, or heaves Abdomen: soft, nontender; no hepatosplenomehaly, BS+; abdominal aorta nontender and not dilated by palpation. Back: no CVA  tenderness Pulses 2+ Musculoskeletal: full range of motion, normal strength, no joint deformities Extremities: no clubbing cyanosis or edema, Homan's sign negative  Neurologic: grossly nonfocal; Cranial nerves grossly wnl Psychologic: Normal mood and affect   ECG (independently read by me): Sinus rhythm at 64; 1st degree AV block, PR 402 msec; no ectopy  November 2020 ECG (independently read by me): Sinus bradycardia 55 bpm, first-degree AV block with a PR interval 238 ms.  QTc interval 380 ms.  No ectopy.  June 2019 ECG (independently read by me): Normal sinus  rhythm at 62 bpm.  First-degree AV block with a PR interval at 240 ms.  No ectopy.  Normal intervals.   July 18, 2017 ECG (independently read by me): Normal sinus rhythm with an isolated PVC.  PR interval 202 ms.  Heart rate 63 bpm.  October 2017 ECG (independently read by me): Sinus bradycardia 57 bpm.  First-degree AV block with a PR interval at 228 ms.  No ST segment changes.  June 2016 ECG (independently read by me): Sinus rhythm with first-degree AV block.  PR interval 264 ms.  QTc interval normal 381 ms.  September 2014 ECG: Sinus bradycardia for the first degree AV block. PR interval  220 ms. QT interval 393 ms.  LABS: BMP Latest Ref Rng & Units 07/22/2019 07/22/2018 10/24/2017  Glucose 70 - 99 mg/dL 107(H) 105(H) 107(H)  BUN 6 - 23 mg/dL '16 20 19  '$ Creatinine 0.40 - 1.50 mg/dL 0.96 1.14 1.09  BUN/Creat Ratio 10 - 24 - - 17  Sodium 135 - 145 mEq/L 139 138 141  Potassium 3.5 - 5.1 mEq/L 4.1 4.7 4.2  Chloride 96 - 112 mEq/L 103 103 102  CO2 19 - 32 mEq/L '29 30 23  '$ Calcium 8.4 - 10.5 mg/dL 9.9 10.1 9.6   Hepatic Function Latest Ref Rng & Units 07/22/2019 07/22/2018 10/24/2017  Total Protein 6.0 - 8.3 g/dL 7.0 6.8 6.8  Albumin 3.5 - 5.2 g/dL 4.3 4.3 4.5  AST 0 - 37 U/L '31 26 28  '$ ALT 0 - 53 U/L 34 34 28  Alk Phosphatase 39 - 117 U/L 84 77 91  Total Bilirubin 0.2 - 1.2 mg/dL 0.7 0.9 0.5  Bilirubin, Direct 0.0 - 0.3 mg/dL  0.2 - -   CBC Latest Ref Rng & Units 07/22/2019 07/22/2018 10/24/2017  WBC 4.0 - 10.5 K/uL 5.3 6.3 6.3  Hemoglobin 13.0 - 17.0 g/dL 14.5 14.7 15.3  Hematocrit 39 - 52 % 43.0 43.3 44.5  Platelets 150 - 400 K/uL 194.0 209.0 204   Lab Results  Component Value Date   MCV 90.7 07/22/2019   MCV 90.0 07/22/2018   MCV 90 10/24/2017   Lab Results  Component Value Date   TSH 1.77 07/22/2019   Lab Results  Component Value Date   HGBA1C 6.2 07/22/2019     Lipid Panel     Component Value Date/Time   CHOL 109 07/22/2019 0818   CHOL 150 10/24/2017 0809   CHOL 144 03/03/2013 0855   TRIG 60.0 07/22/2019 0818   TRIG 58 03/03/2013 0855   HDL 41.70 07/22/2019 0818   HDL 48 10/24/2017 0809   HDL 46 03/03/2013 0855   CHOLHDL 3 07/22/2019 0818   VLDL 12.0 07/22/2019 0818   LDLCALC 55 07/22/2019 0818   LDLCALC 89 10/24/2017 0809   LDLCALC 86 03/03/2013 0855     RADIOLOGY: No results found.  IMPRESSION:  1. Essential hypertension   2. Bradycardia   3. First degree AV block   4. Hyperlipidemia with target LDL less than 70   5. REM sleep behavior disorder     ASSESSMENT AND PLAN: Clarence. 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  He has had first-degree block.  In the past, he has had some episodes of mild dizziness particularly if he bends  over which has resulted in reduction in his beta-blocker regimen.  Remotely, his atenolol dose was switched to carvedilol low-dose which has been beneficial.  However he continues to experience some mild dizzy spells with this.  Blood pressure today shows mild orthostatic hypotension.  His resting pulse is 64.  He has first-degree AV block which is stable.  Since he is only on carvedilol at 3.125 mg twice a day have  suggested he reduce this to daily for the next 3 days and then reduce to one half a pill for 2 days with ultimate discontinuance.  He continues to be on atorvastatin 40 mg daily for hyperlipidemia with target LDL less than 70.  He has recently developed vivid dreams in the latter portion of the night with screaming and thrashing and recently dove out of his bed and reportedly broke a nightstand lamp in the process.  There is concern that this may be a REM sleep disorder parasomnia and for this reason he underwent MRI imaging today for further assessment.  He is aware of potential association with future development of Parkinson's disease and Lewy body disorder.  He will be following up with Dr. Brett Fairy following the MRI evaluation.  He continues to use a customized oral appliance for his mild sleep apnea.  I will see him in 6 months for cardiology evaluation.   Troy Sine, MD, [facility]  12/21/2019 10:39 AM

## 2019-12-15 NOTE — Patient Instructions (Signed)
Medication Instructions:  WEAN OFF YOUR CARVEDILOL- FOR THE FIRST 3 DAYS ONLY TAKE IT ONCE DAILY, THE NEXT TWO DAYS TAKE 1/2 TABLET DAILY, THEN STOP IT COMPLETELY  *If you need a refill on your cardiac medications before your next appointment, please call your pharmacy*   Follow-Up: At [facility], you and your health needs are our priority.  As part of our continuing mission to provide you with exceptional heart care, we have created designated Provider Care Teams.  These Care Teams include your primary Cardiologist (physician) and Advanced Practice Providers (APPs -  Physician Assistants and Nurse Practitioners) who all work together to provide you with the care you need, when you need it.  We recommend signing up for the patient portal called "MyChart".  Sign up information is provided on this After Visit Summary.  MyChart is used to connect with patients for Virtual Visits (Telemedicine).  Patients are able to view lab/test results, encounter notes, upcoming appointments, etc.  Non-urgent messages can be sent to your provider as well.   To learn more about what you can do with MyChart, go to [email or web address].    Your next appointment:   6 month(s)  The format for your next appointment:   In Person  Provider:   Shelva Majestic, MD

## 2019-12-16 ENCOUNTER — Telehealth: Payer: Self-pay | Admitting: *Deleted

## 2019-12-16 NOTE — Progress Notes (Signed)
The orbits appear normal.   The VIIth/VIIIth nerve complex appears normal.  The mastoid air cells appear normal.  There are couple small mucous retention cyst in the maxillary sinus.  A couple of the ethmoid air cells, right sphenoid and the right frontal sinus show mild mucoperiosteal thickening.  Flow voids are identified within the major intracerebral arteries.    After the infusion of contrast material, a normal enhancement pattern is noted.   IMPRESSION:   1.  Couple punctate T2/FLAIR hyperintense foci in the subcortical or deep white matter of the hemispheres consistent with very minimal chronic microvascular ischemic change, typical for age. 2.  Mild ethmoid, sphenoid and right frontal chronic sinusitis. 3.  There were no acute findings and there was a normal enhancement pattern.  Normal MRI for age! CD

## 2019-12-16 NOTE — Telephone Encounter (Signed)
Spoke with pt and discussed results of MRI brain per Dr. Brett Fairy, normal for age. I let him know the results were sent to his mychart and there were findings of retention cysts, some thickening in the mucosa, etc. Pt does have a history of sinus problems. He is aware a sleep study was ordered with target of July. He is ok with this plan. He will await a call from the sleep lab to schedule. He verbalized appreciation.

## 2019-12-16 NOTE — Telephone Encounter (Signed)
-----   Message from Larey Seat, MD sent at 12/16/2019  4:44 PM EDT ----- The orbits appear normal.   The VIIth/VIIIth nerve complex appears normal.  The mastoid air cells appear normal.  There are couple small mucous retention cyst in the maxillary sinus.  A couple of the ethmoid air cells, right sphenoid and the right frontal sinus show mild mucoperiosteal thickening.  Flow voids are identified within the major intracerebral arteries.    After the infusion of contrast material, a normal enhancement pattern is noted.   IMPRESSION:   1.  Couple punctate T2/FLAIR hyperintense foci in the subcortical or deep white matter of the hemispheres consistent with very minimal chronic microvascular ischemic change, typical for age. 2.  Mild ethmoid, sphenoid and right frontal chronic sinusitis. 3.  There were no acute findings and there was a normal enhancement pattern.  Normal MRI for age! CD

## 2019-12-21 ENCOUNTER — Encounter: Payer: Self-pay | Admitting: Cardiovascular Disease

## 2019-12-28 IMAGING — MR MRI OF THE RIGHT WRIST WITHOUT CONTRAST
4 of 6 series · 18 of 40 positions shown · non-contrast
Comparison: Radiographs 11/22/2018

CLINICAL DATA: Right wrist pain, weakness and swelling for 6
months. No specific injury.

EXAM:
MR OF THE RIGHT WRIST WITHOUT CONTRAST
TECHNIQUE: Multiplanar, multisequence MR imaging of the right wrist was
performed. No intravenous contrast was administered.

[Series 3: T2 fat-sat · axial · 3.0mm · 0.23mm/px · z∈[+5,+47]mm · 3 of 19 slices shown (1 of 2)]
[im 3/19]
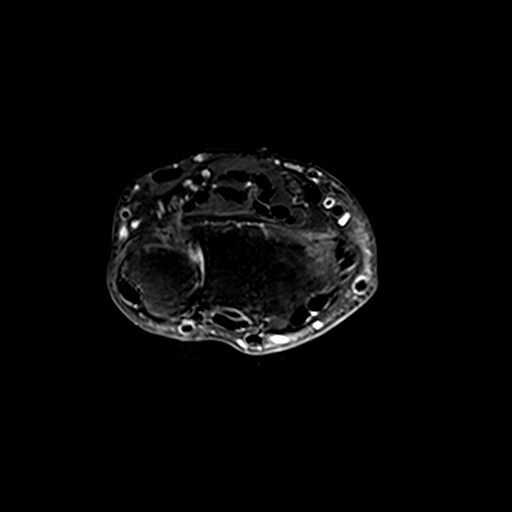
[im 11/19]
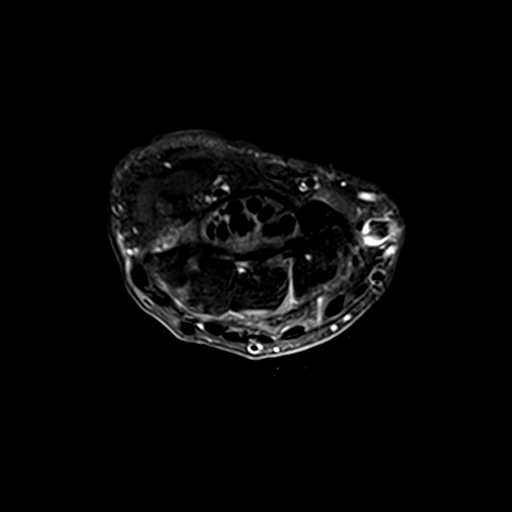
[im 16/19]
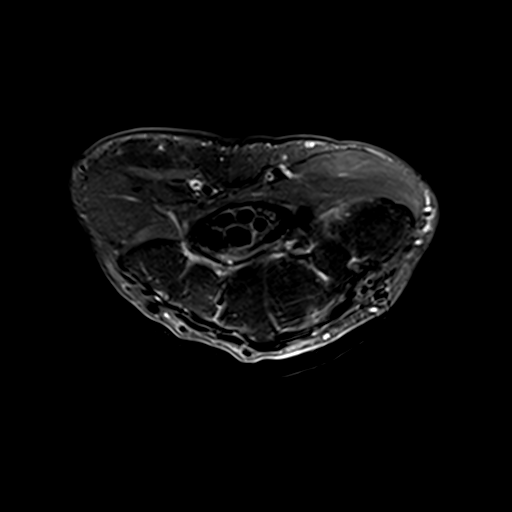

[Series 6: T2 fat-sat · coronal · 3.0mm · 0.23mm/px · 3 of 14 slices shown (2 of 2)]
[im 1/14]
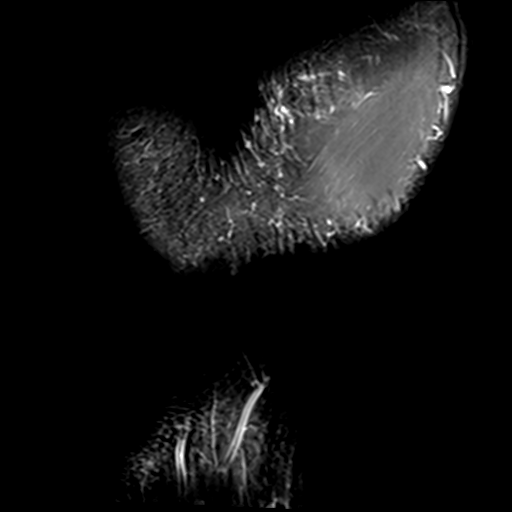
[im 7/14]
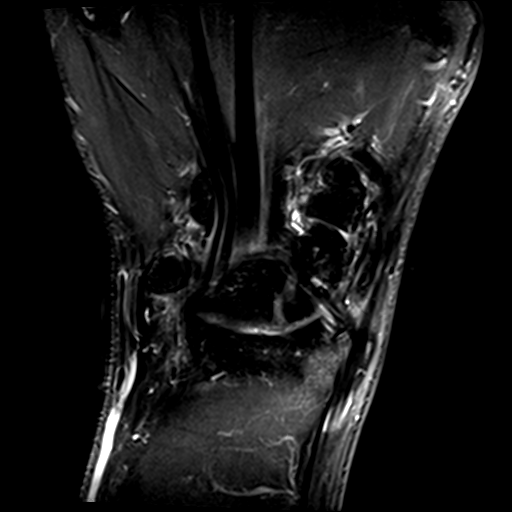
[im 14/14]
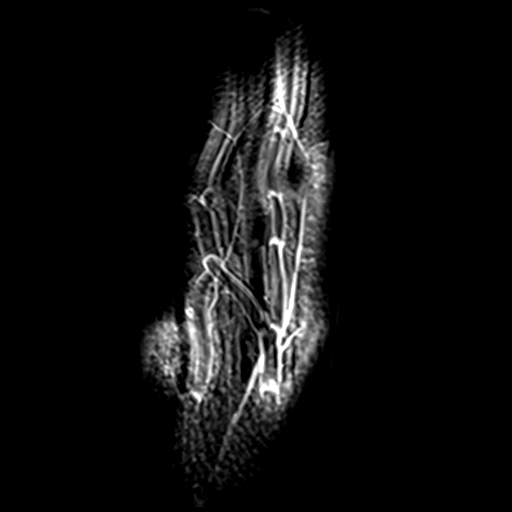

[Series 7: PD fat-sat · coronal · 3.0mm · 0.23mm/px · 5 of 14 slices shown (1 of 2)]
[im 1/14]
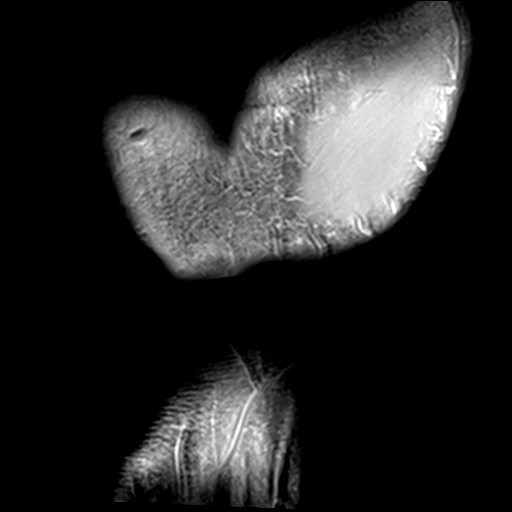
[im 4/14]
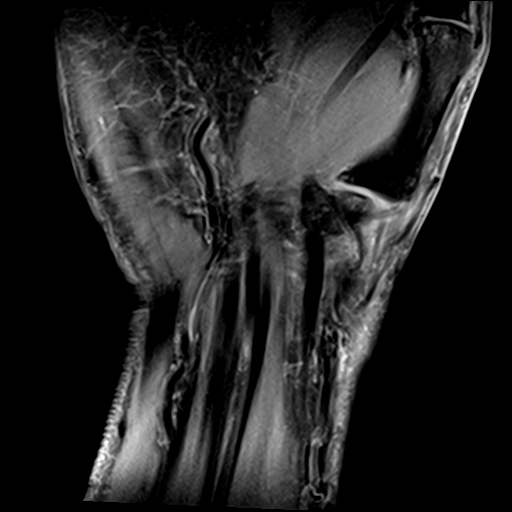
[im 7/14]
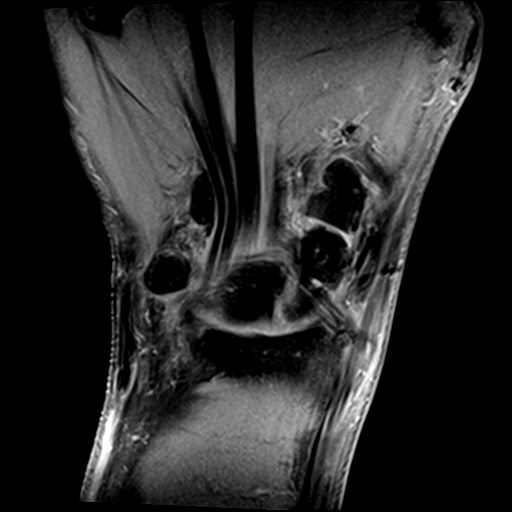
[im 10/14]
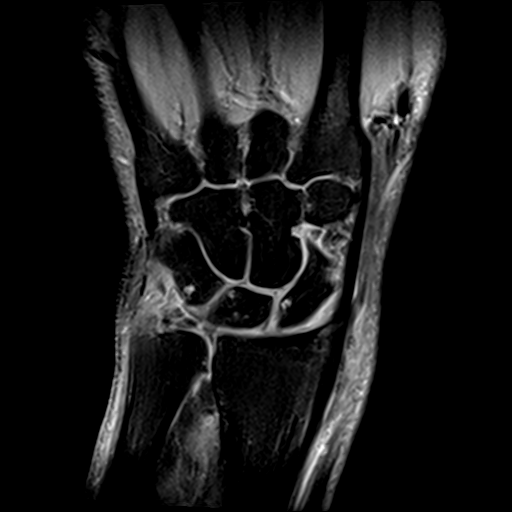
[im 14/14]
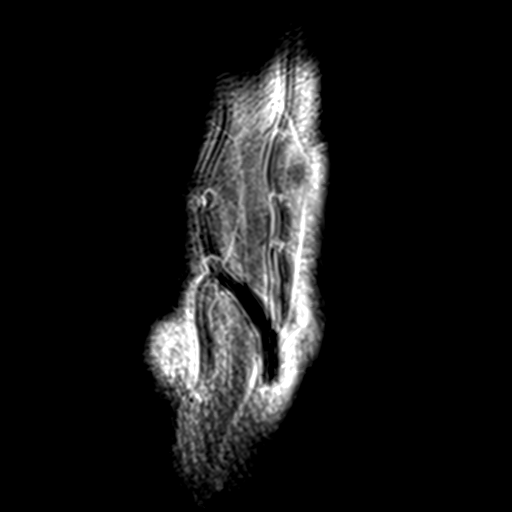

[Series 8: PD fat-sat · sagittal · 3.0mm · 0.23mm/px · 7 of 23 slices shown (2 of 2)]
[im 1/23]
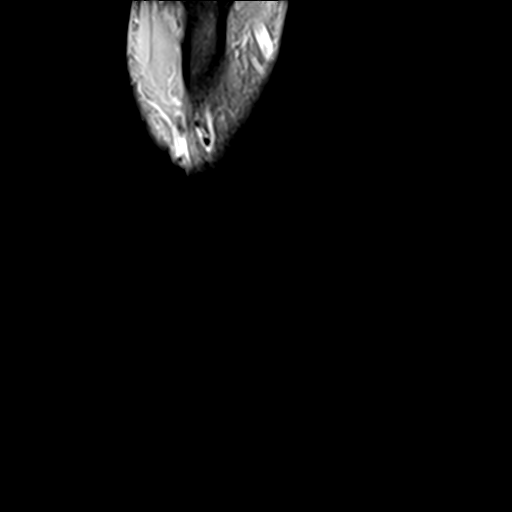
[im 3/23]
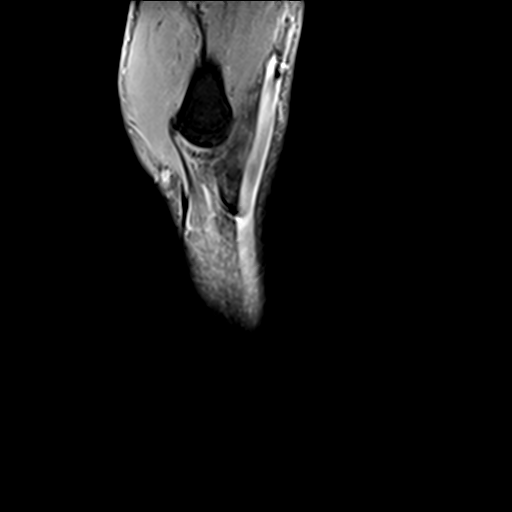
[im 6/23]
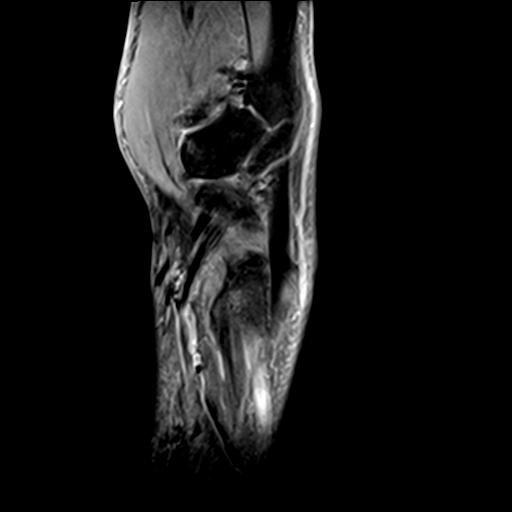
[im 9/23]
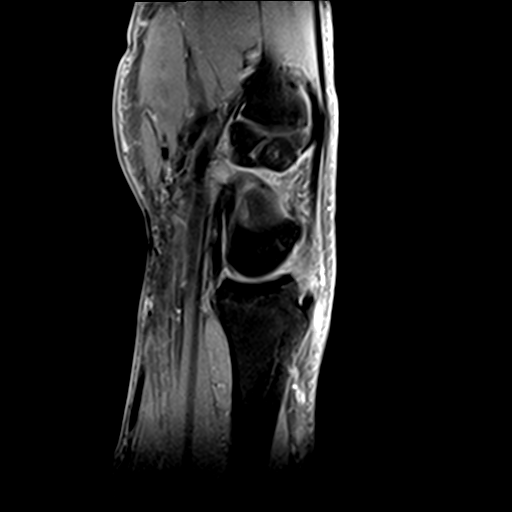
[im 12/23]
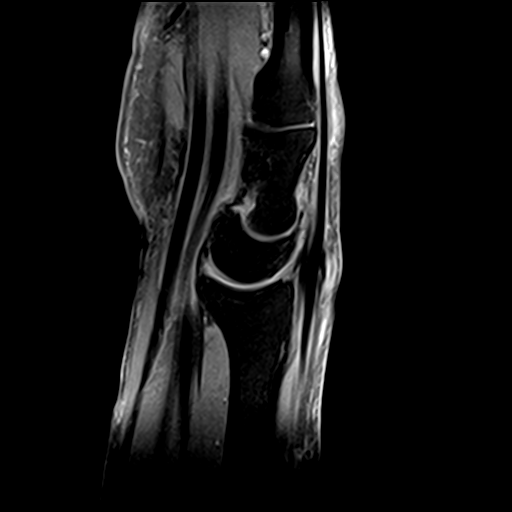
[im 14/23]
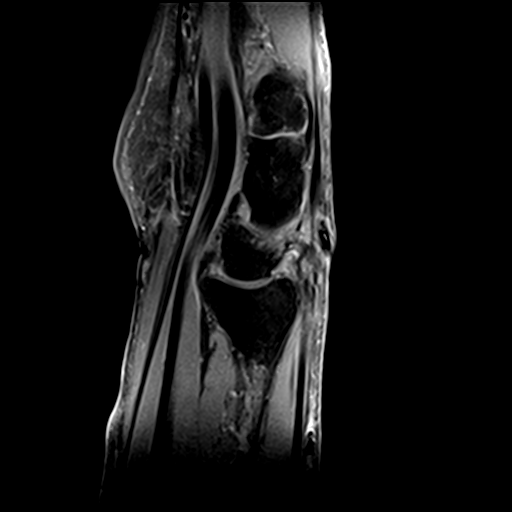
[im 20/23]
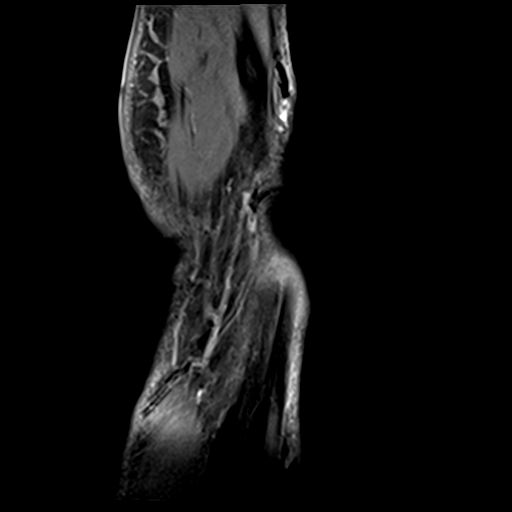

[18 of 40 positions shown; findings below may reference images not displayed]

FINDINGS: Ligaments: The intercarpal ligaments are intact. The intercarpal
joint spaces are maintained.

Triangular fibrocartilage: Degenerated and torn. There appear to be
at least 2 small central tear/perforations. Associated fluid in the
radioulnar joint space.

Tendons: Significant tendinopathy and tenosynovitis involving the
APL and EPB tendons consistent with de Quervain tenosynovitis. The
APL in particular is thickened and demonstrates interstitial tears.
There is also a small accessory tendon palmar to the APL.

Mild tendinopathy involving the extensor carpi ulnaris. The other
major dorsal and volar wrist tendons are intact.

Carpal tunnel/median nerve: The carpal tunnel tendons appear normal.
No significant tendinopathy or tenosynovitis. No mass or mass
effect. No ganglion cyst. The median nerve appears normal.

Guyon's canal: Normal.

Joint/cartilage: Moderate degenerative changes involving the wrist
with numerous carpal cysts. No significant joint effusion or
synovitis. There are also mild to moderate degenerative changes at
the CMC joint of the thumb.

Bones/carpal alignment: No acute bony findings. No bone contusion,
fracture or AVN. There is mild edema like signal abnormality in the
radial styloid adjacent to the first dorsal compartment tendons,
likely reactive change.

Other: The wrist and hand musculature is grossly normal.
IMPRESSION: 1. MR findings consistent with de Quervain tenosynovitis. Mild
adjacent reactive marrow edema in the radial styloid.
2. Mild tendinopathy involving the extensor carpi ulnaris tendon.
3. Mild degenerative changes involving the wrist and CMC joint of
the thumb with scattered degenerative carpal cysts.
4. Degenerated and torn TFCC.
5. The intercarpal ligaments appear intact.

## 2020-01-13 ENCOUNTER — Other Ambulatory Visit: Payer: PPO

## 2020-01-25 ENCOUNTER — Ambulatory Visit (INDEPENDENT_AMBULATORY_CARE_PROVIDER_SITE_OTHER): Payer: PPO | Admitting: Neurology

## 2020-01-25 DIAGNOSIS — G4752 REM sleep behavior disorder: Secondary | ICD-10-CM | POA: Diagnosis not present

## 2020-01-25 DIAGNOSIS — I440 Atrioventricular block, first degree: Secondary | ICD-10-CM

## 2020-02-11 DIAGNOSIS — G4752 REM sleep behavior disorder: Secondary | ICD-10-CM | POA: Insufficient documentation

## 2020-02-11 NOTE — Procedures (Signed)
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 Ronnald Ware for a sleep consultation for the first time in 10-2015. Now he is here for a different concern: He lost weight, he was considered pre-diabetic and reversed this condition. His snoring is less. He is using a mouth- piece made to measure by Dr. Ron Ware. No longer wakes up with headaches when wearing this : Recently the enactment of dreams has become a more prominent feature of his sleep, and of this year he had fallen out of bed dove out of bed as reported also broke nightstand lamp in the process. REM sleep behavior was not a feature rehab explored when we first met over 4 years ago so it is a fairly recent event. Clarence Ware has no history of shift work.  The patient endorsed the Epworth Sleepiness Scale at 5 points.   The patient's weight 194 pounds with a height of 74 (inches), resulting in a BMI of 24.9 kg/m2. The patient's neck circumference measured 16.25 inches.  CURRENT MEDICATIONS: Aspirin, Lipitor, Coreg, Zyrtec, Zetia, Cozaar, Cialis   PROCEDURE:  This is a multichannel digital polysomnogram utilizing the Somnostar 11.2 system.  Electrodes and sensors were applied and monitored per AASM Specifications.   EEG, EOG, Chin and Limb EMG, were sampled at 200 Hz.  ECG, Snore and Nasal Pressure, Thermal Airflow, Respiratory Effort, CPAP Flow and Pressure, Oximetry was sampled at 50 Hz. Digital video and audio were recorded.      BASELINE STUDY: Lights Out was at 21:52 and Lights On at 04:59.  Total recording time (TRT) was 428 minutes, with a total sleep time (TST) of 366.5 minutes.   The patient's sleep latency was 9.5 minutes.  REM latency was 75 minutes.  The sleep efficiency was 85.6 %.     SLEEP ARCHITECTURE: WASO (Wake  after sleep onset) was 54 minutes.  There were 33 minutes in Stage N1, 97.5 minutes Stage N2, 128.5 minutes Stage N3 and 107.5 minutes in Stage REM.   The percentage of Stage N1 was 9.%, Stage N2 was 26.6%, Stage N3 was 35.1% and Stage R (REM sleep) was 29.3%.   RESPIRATORY ANALYSIS:  There were a total of 3 respiratory events:  0 obstructive apneas, 0 central apneas and 0 mixed apneas with a total of 0 apneas and an apnea index (AI) of 0 /hour. There were 3 hypopneas with a hypopnea index of .5 /hour. The patient also had 0 respiratory event related arousals (RERAs).      The total APNEA/HYPOPNEA INDEX (AHI) was 0.5/hour and the total RESPIRATORY DISTURBANCE INDEX was  0.5 /hour.  1 event occurred in REM sleep and 4 events in NREM. The REM AHI was  0.6 /hour, versus a non-REM AHI of 0.5. The patient spent 0 minutes of total sleep time in the supine position and 367 minutes in non-supine. The supine AHI was 0.0 versus a non-supine AHI of 0.5.  OXYGEN SATURATION & C02:  The Wake baseline 02 saturation was 95%, with the lowest being 92%. Time spent below 89% saturation equaled 0 minutes.  The arousals were noted as: 94 were spontaneous, 4 were associated with PLMs, 2 were associated with respiratory events. The patient had a  total of 98 Periodic Limb Movements.  The Periodic Limb Movement (PLM) Arousal index was 0.7/hour.  Audio and video analysis did not show any abnormal or unusual movements, behaviors, phonations or vocalizations.   Snoring was noted. EKG was in keeping with normal sinus rhythm (NSR).   IMPRESSION:  1. No evidence of Obstructive Sleep Apnea(OSA) 2. No clinically relevant degree of Periodic Limb Movement Disorder (PLMD) , especially none in REM sleep.  3. Normal EKG, no hypoxemia.     RECOMMENDATIONS:  1. Advise to continue treatment with dental device for snoring control.  2. There is no evidence of REM BD captured in this study. Treatment may empirically start with  Melatonin or SSRI. I want to be conservative with Klonopin as a habit forming /potentially addictive medication.    I certify that I have reviewed the entire raw data recording prior to the issuance of this report in accordance with the Standards of Accreditation of the American Academy of Sleep Medicine (AASM)   Clarence Seat, MD Diplomat, American Board of Psychiatry and Neurology  Diplomat, American Board of Sleep Medicine Market researcher, [facility] [employer]

## 2020-02-11 NOTE — Progress Notes (Signed)
IMPRESSION:   1. No evidence of Obstructive Sleep Apnea(OSA)  2. No clinically relevant degree of Periodic Limb Movement  Disorder (PLMD) , especially none in REM sleep.  3. Normal EKG, no hypoxemia.     RECOMMENDATIONS:   1. Advise to continue treatment with dental device for snoring  control.  2. There is no evidence of REM BD captured in this study.  Treatment may empirically start with Melatonin or SSRI. I want to  be conservative with Klonopin as a habit forming /potentially  addictive medication.

## 2020-02-12 ENCOUNTER — Telehealth: Payer: Self-pay | Admitting: Neurology

## 2020-02-12 NOTE — Telephone Encounter (Signed)
Called the patient to review the SSR advised there was no evidence of anything concerning based off this sleep study. Advised there was no indication of REM BD. Advised that Dr Brett Fairy would recommend to continue to treat with melatonin which he is currently taking 3 mg which can sometimes cause a drowsy feeling in the morning when he wakes up. Patient states that even at that dose he still has problems with movements and yelling in sleep. Informed him he could try taking additional 1 mg tab of melatonin to see if that helps. Scheduled the patient for follow up visit on 03/16/2020 at 9:30 am

## 2020-02-12 NOTE — Telephone Encounter (Signed)
-----   Message from Larey Seat, MD sent at 02/11/2020  1:07 PM EDT ----- IMPRESSION:   1. No evidence of Obstructive Sleep Apnea(OSA)  2. No clinically relevant degree of Periodic Limb Movement  Disorder (PLMD) , especially none in REM sleep.  3. Normal EKG, no hypoxemia.     RECOMMENDATIONS:   1. Advise to continue treatment with dental device for snoring  control.  2. There is no evidence of REM BD captured in this study.  Treatment may empirically start with Melatonin or SSRI. I want to  be conservative with Klonopin as a habit forming /potentially  addictive medication.

## 2020-02-23 ENCOUNTER — Emergency Department (HOSPITAL_COMMUNITY)
Admission: EM | Admit: 2020-02-23 | Discharge: 2020-02-23 | Disposition: A | Discharge status: Home / Self Care | Payer: PPO | Attending: Emergency Medicine | Admitting: Emergency Medicine

## 2020-02-23 ENCOUNTER — Encounter: Payer: Self-pay | Admitting: Internal Medicine

## 2020-02-23 ENCOUNTER — Encounter (HOSPITAL_COMMUNITY): Payer: Self-pay | Admitting: Emergency Medicine

## 2020-02-23 DIAGNOSIS — Y929 Unspecified place or not applicable: Secondary | ICD-10-CM | POA: Insufficient documentation

## 2020-02-23 DIAGNOSIS — Y9301 Activity, walking, marching and hiking: Secondary | ICD-10-CM | POA: Diagnosis not present

## 2020-02-23 DIAGNOSIS — Z79899 Other long term (current) drug therapy: Secondary | ICD-10-CM | POA: Insufficient documentation

## 2020-02-23 DIAGNOSIS — Z87891 Personal history of nicotine dependence: Secondary | ICD-10-CM | POA: Diagnosis not present

## 2020-02-23 DIAGNOSIS — W208XXA Other cause of strike by thrown, projected or falling object, initial encounter: Secondary | ICD-10-CM | POA: Insufficient documentation

## 2020-02-23 DIAGNOSIS — S81811A Laceration without foreign body, right lower leg, initial encounter: Secondary | ICD-10-CM | POA: Insufficient documentation

## 2020-02-23 DIAGNOSIS — I10 Essential (primary) hypertension: Secondary | ICD-10-CM | POA: Diagnosis not present

## 2020-02-23 DIAGNOSIS — Y999 Unspecified external cause status: Secondary | ICD-10-CM | POA: Insufficient documentation

## 2020-02-23 DIAGNOSIS — Z23 Encounter for immunization: Secondary | ICD-10-CM | POA: Diagnosis not present

## 2020-02-23 DIAGNOSIS — J45909 Unspecified asthma, uncomplicated: Secondary | ICD-10-CM | POA: Diagnosis not present

## 2020-02-23 DIAGNOSIS — M791 Myalgia, unspecified site: Secondary | ICD-10-CM | POA: Insufficient documentation

## 2020-02-23 MED ORDER — CEPHALEXIN 500 MG PO CAPS: 500.0000 mg | ORAL_CAPSULE | Freq: Once | ORAL | Status: DC

## 2020-02-23 MED ORDER — DOXYCYCLINE HYCLATE 100 MG PO CAPS
100.0000 mg | ORAL_CAPSULE | Freq: Two times a day (BID) | ORAL | 0 refills | Status: AC
Start: 2020-02-23 — End: 2020-03-01

## 2020-02-23 MED ORDER — TETANUS-DIPHTH-ACELL PERTUSSIS 5-2.5-18.5 LF-MCG/0.5 IM SUSP
0.5000 mL | Freq: Once | INTRAMUSCULAR | Status: AC
  Administered 2020-02-23: 0.5 mL via INTRAMUSCULAR
  Filled 2020-02-23: qty 0.5

## 2020-02-23 MED ORDER — LIDOCAINE-EPINEPHRINE (PF) 2 %-1:200000 IJ SOLN
10.0000 mL | Freq: Once | INTRAMUSCULAR | Status: AC
  Administered 2020-02-23: 10 mL
  Filled 2020-02-23: qty 20

## 2020-02-23 MED ORDER — ACETAMINOPHEN 325 MG PO TABS
650.0000 mg | ORAL_TABLET | Freq: Once | ORAL | Status: AC
  Administered 2020-02-23: 650 mg via ORAL
  Filled 2020-02-23: qty 2

## 2020-02-23 MED ORDER — DOXYCYCLINE HYCLATE 100 MG PO TABS
100.0000 mg | ORAL_TABLET | Freq: Once | ORAL | Status: AC
  Administered 2020-02-23: 100 mg via ORAL
  Filled 2020-02-23: qty 1

## 2020-02-23 NOTE — Discharge Instructions (Addendum)
This wound has moderate to high risk of compartment syndrome. Please be vigilant and monitor your leg. Signs of compartment syndrome include: worsening pain,, pale skin, numbness, loss of pulse, cold extremity, or if your muscle feels very firm. If you experience any of these concerning symptoms, please report immediately back to the ED. Starting tomorrow, take the doxycycline for 12 hours until gone. Ice your leg frequently to help with pain and swelling. Elevate your leg as much as possible to help with swelling. Call the orthopedist tomorrow to schedule close follow-up appointment.  If you are unable to be seen in the next few days by the specialist or by PCP, you can be reevaluated in the [facility] urgent care or emergency department. You will need sutures removed in about 8-10 days.

## 2020-02-23 NOTE — ED Provider Notes (Signed)
[place] DEPT Provider Note   CSN: [id number] Arrival date & time: 02/23/20  4081     History Chief Complaint  Patient presents with  . Laceration    Clarence Ware is a 69 y.o. male presenting to the emergency department with complaint of sudden onset of right lower leg laceration that occurred prior to arrival.  Patient states he was walking in the woods behind his house when he caught his leg on a falling tree limb.  This caused a laceration.  He has associated pain to the site.  Denies numbness to his foot difficulty moving his ankle.  Denies history of immunocompromise or diabetes.  He is not on anticoagulation, reports he takes 81 mg aspirin daily.  No other injuries reported.  No history of MRSA.  The history is provided by the patient.       Past Medical History:  Diagnosis Date  . Allergy   . First degree heart block   . Hyperlipidemia   . Hypertension   . REM sleep behavior disorder   . Substance abuse [facility])     Patient Active Problem List   Diagnosis Date Noted  . Uncontrolled REM sleep behavior disorder 02/11/2020  . REM sleep behavior disorder 11/26/2019  . Ataxia 11/26/2019  . De Quervain's tenosynovitis, right 10/07/2018  . Benign prostatic hyperplasia without lower urinary tract symptoms 01/18/2017  . Routine general medical examination at a health care facility 01/18/2017  . Essential hypertension 12/18/2014  . Hyperlipidemia with target LDL less than 100 12/18/2014  . First degree heart block 03/30/2013  . IMPAIRED FASTING GLUCOSE 06/30/2010  . GERD 06/26/2008  . ERECTILE DYSFUNCTION, MILD 01/30/2008  . VENTRICULAR HYPERTROPHY, LEFT 11/29/2006  . COLON POLYP 09/06/2006  . ASTHMA, EXERCISE INDUCED 09/06/2006    Past Surgical History:  Procedure Laterality Date  . INGUINAL HERNIA REPAIR Right 03/10/2015   Procedure: OPEN RIGHT INGUINAL HERNIA REPAIR;  Surgeon: Johnathan Hausen, MD;  Location: [facility] ORS;  Service:  General;  Laterality: Right;  With MESH  . VASECTOMY         Family History  Problem Relation Age of Onset  . Heart disease Mother   . Stroke Mother   . Heart disease Father 65       MI  . Hypertension Father   . Hypertension Brother   . Cancer Brother   . Diabetes Brother   . Heart disease Brother   . Hyperlipidemia Brother   . Cancer Brother   . Hyperlipidemia Sister   . Coronary artery disease Brother 37       CABG  . Colon cancer Paternal Grandmother     Social History   Tobacco Use  . Smoking status: Former Smoker    Packs/day: 0.25    Years: 1.00    Pack years: 0.25    Quit date: 07/10/1992    Years since quitting: 27.6  . Smokeless tobacco: Never Used  Vaping Use  . Vaping Use: Never used  Substance Use Topics  . Alcohol use: No    Alcohol/week: 0.0 standard drinks  . Drug use: Not Currently    Comment: sobrity for 10 years    Home Medications Prior to Admission medications   Medication Sig Start Date End Date Taking? Authorizing Provider  aspirin EC 81 MG tablet Take 81 mg by mouth daily.    [provider]  atorvastatin (LIPITOR) 40 MG tablet TAKE ONE TABLET BY MOUTH DAILY 09/15/19   Troy Sine, MD  cetirizine (ZYRTEC) 10 MG tablet Take 10 mg by mouth as directed.     [provider]  Cholecalciferol (VITAMIN D) 2000 units CAPS Take 1 capsule by mouth daily.    [provider]  doxycycline (VIBRAMYCIN) 100 MG capsule Take 1 capsule (100 mg total) by mouth 2 (two) times daily for 7 days. 02/23/20 03/01/20  Fatuma Dowers, [place] N, PA-C  ezetimibe (ZETIA) 10 MG tablet TAKE ONE TABLET BY MOUTH DAILY 12/11/19   Troy Sine, MD  losartan (COZAAR) 25 MG tablet TAKE ONE TABLET BY MOUTH DAILY 12/11/19   [name], MD  melatonin 3 MG TABS tablet Take 2 tablets (6 mg total) by mouth at bedtime. 11/26/19   [name], MD  Multiple Vitamin (MULTI-VITAMIN PO) Take 1 tablet by mouth.    [provider]  tadalafil (CIALIS) 20  MG tablet Use as directed. 01/13/16   Darlyne Russian, MD  losartan (COZAAR) 25 MG tablet TAKE ONE TABLET BY MOUTH DAILY 06/16/19   [name], MD    Allergies    Nexium [esomeprazole magnesium]  Review of Systems   Review of Systems  Musculoskeletal: Positive for myalgias.  Skin: Positive for wound.  All other systems reviewed and are negative.   Physical Exam Updated Vital Signs BP (!) 142/85 (BP Location: Right Arm)   Pulse 76   Temp 98.3 F (36.8 C) (Oral)   Resp 18   Ht 6\' 2"  (1.88 m)   Wt 85.7 kg   SpO2 100%   BMI 24.27 kg/m   Physical Exam Vitals and nursing note reviewed.  Constitutional:      General: He is not in acute distress.    Appearance: He is well-developed. He is not ill-appearing.  HENT:     Head: Normocephalic and atraumatic.  Eyes:     Conjunctiva/sclera: Conjunctivae normal.  Cardiovascular:     Rate and Rhythm: Normal rate.  Pulmonary:     Effort: Pulmonary effort is normal.  Musculoskeletal:     Comments: Laceration approximately 10cm to the right mid anterior lower leg just lateral to the tibia. Wound is jagged vertically oriented, appears to go through the fascia, muscle is exposed. There does not appear to be any transection of the muscle tissue. Wound does not appear grossly contaminated, it is not actively bleeding.  Preserved dorsiflexion of the ankle.   Skin:    General: Skin is warm.  Neurological:     Mental Status: He is alert.     Comments: Normal distal RLE sensation and palpable DP pulse  Psychiatric:        Behavior: Behavior normal.     ED Results / Procedures / Treatments   Labs (all labs ordered are listed, but only abnormal results are displayed) Labs Reviewed - No data to display  EKG None  Radiology No results found.  Procedures .Marland KitchenLaceration Repair  Date/Time: 02/23/2020 7:39 PM Performed by: Breken Nazari, [place] N, PA-C Authorized by: Shaquanta Harkless, [place] N, PA-C   Consent:    Consent obtained:  Verbal    Consent given by:  Patient   Risks discussed:  Infection and pain Anesthesia (see MAR for exact dosages):    Anesthesia method:  Local infiltration   Local anesthetic:  Lidocaine 2% WITH epi Laceration details:    Location:  Leg   Leg location:  R lower leg   Length (cm):  10   Depth (mm):  10 Repair type:    Repair type:  Intermediate Pre-procedure  details:    Preparation:  Patient was prepped and draped in usual sterile fashion Exploration:    Hemostasis achieved with:  Direct pressure   Wound exploration: wound explored through full range of motion and entire depth of wound probed and visualized     Wound extent: fascia violated     Wound extent: no foreign bodies/material noted     Contaminated: no   Treatment:    Area cleansed with:  Saline   Amount of cleaning:  Extensive   Irrigation solution:  Sterile saline   Irrigation volume:  1L   Irrigation method:  Pressure wash   Visualized foreign bodies/material removed: no   Fascia repair:    Suture size:  4-0   Suture material:  Chromic gut   Suture technique:  Running   Number of sutures:  5 Skin repair:    Repair method:  Sutures   Suture size:  4-0   Suture material:  Nylon   Suture technique:  Horizontal mattress and simple interrupted (5 horizontal, 4 simple interrupted)   Number of sutures:  9 Approximation:    Approximation:  Close Post-procedure details:    Dressing:  Non-adherent dressing (coban, ace wrap)   Patient tolerance of procedure:  Tolerated well, no immediate complications Comments:     Wound was anesthetized and copiously irrigated prior to closure.  Fascial layer was loosely closed with gut, followed by the skin.  The wound was under tension, therefore 5 horizontal mattress sutures were placed in the skin.  4 simple interrupted were placed between each mattress suture for skin approximation.  Wound had adequate closure following procedure.  Patient tolerated well.   (including critical care  time)  Medications Ordered in ED Medications  doxycycline (VIBRA-TABS) tablet 100 mg (has no administration in time range)  acetaminophen (TYLENOL) tablet 650 mg (650 mg Oral Given 02/23/20 1746)  lidocaine-EPINEPHrine (XYLOCAINE W/EPI) 2 %-1:200000 (PF) injection 10 mL (10 mLs Infiltration Given by Other 02/23/20 1753)  Tdap (BOOSTRIX) injection 0.5 mL (0.5 mLs Intramuscular Given 02/23/20 1745)    ED Course  I have reviewed the triage vital signs and the nursing notes.  Pertinent labs & imaging results that were available during my care of the patient were reviewed by me and considered in my medical decision making (see chart for details).  Clinical Course as of Feb 22 1909  Mon Feb 22, 8830  6525 69 year old male who is on aspirin presented ED after tripping over a log in the woods and sustaining a laceration to the right lower extremity.  He denies any numbness or weakness in his feet.  He denies any other blood thinner use.  His last tetanus shot was in 2013.  He denies any fevers or chills.  This occurred earlier today.  On arrival bleeding is controlled.  He does have approximately 3 inch linear laceration of the right lower extremity, over the anterior aspect, which does appear to violate the fascia into the anterior compartment.  We will plan to copiously irrigate out this wound.  There are no foreign bodies visible.  Will need subcutaneous as well as dermal sutures, we will place him on prophylactic antibiotics.  I discussed with him arranging an appointment in 1 week with his PCP to reevaluate his wound.  The sutures need to come out about 7 to 10 days.   [MT]    Clinical Course User Index [MT] Trifan, Carola Rhine, MD   MDM Rules/Calculators/A&P  Patient presenting to the ED with complaint of right lower leg laceration after catching his leg on a farm limb in his backyard.  Neurovascular intact.  The wound does go through the fascia though does not appear to  transect the muscle, it is vertical orientation to the anterior lower leg. Wound is not grossly contaminated. Preserved range of motion of the foot.  Wound was anesthetized and copiously irrigated prior to closure.  Fascial layer was loosely closed, followed by the skin.  Wound was dressed.  Prophylactic antibiotic, doxycycline, administered and will be prescribed.  Discussed risk of infection and compartment syndrome given location and depth of wound.  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  He also reports history of compartment syndrome after an injury many years ago.  Discussed concerning signs for compartment syndrome and infection and instruction to return to the ED should he experience any of this.  Wound care discussed.  Ice, elevation, NSAIDs.  Ortho referral provided as needed.  Instructed close follow-up.  Patient is well-appearing, agreeable to plan, appropriate for discharge.  Final Clinical Impression(s) / ED Diagnoses Final diagnoses:  Laceration of lower leg, right, complicated, initial encounter    Rx / DC Orders ED Discharge Orders         Ordered    doxycycline (VIBRAMYCIN) 100 MG capsule  2 times daily     Discontinue  Reprint     02/23/20 1905           Aysiah Jurado, [place] N, PA-C 02/23/20 1952    Wyvonnia Dusky, MD 02/23/20 2127

## 2020-02-23 NOTE — ED Triage Notes (Signed)
Pt was in woods behind his house when hit his RLE on limb causing laceration. Pt takes ASA daily. Has wrapped in triage with bleeding controlled at this time.

## 2020-02-27 ENCOUNTER — Other Ambulatory Visit: Payer: Self-pay

## 2020-02-27 ENCOUNTER — Ambulatory Visit (HOSPITAL_COMMUNITY)
Admission: EM | Admit: 2020-02-27 | Discharge: 2020-02-27 | Disposition: A | Discharge status: Home / Self Care | Payer: PPO | Attending: Family Medicine | Admitting: Family Medicine

## 2020-02-27 DIAGNOSIS — Z5189 Encounter for other specified aftercare: Secondary | ICD-10-CM

## 2020-02-27 DIAGNOSIS — T8149XA Infection following a procedure, other surgical site, initial encounter: Secondary | ICD-10-CM | POA: Diagnosis not present

## 2020-02-27 MED ORDER — AMOXICILLIN-POT CLAVULANATE 875-125 MG PO TABS
1.0000 | ORAL_TABLET | Freq: Two times a day (BID) | ORAL | 0 refills | Status: DC
Start: 2020-02-27 — End: 2020-03-05

## 2020-02-27 NOTE — ED Provider Notes (Signed)
Clarence Ware    CSN: [id number] Arrival date & time: 02/27/20  0086      History   Chief Complaint Chief Complaint  Patient presents with  . Wound Check    HPI Clarence Ware is a 69 y.o. male.   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  Tripped while walking through the woods.  Had a large laceration on his right shin.  Was seen in the emergency room where this was irrigated and closed.  Layered closure.  He was placed on doxycycline twice daily.  He has been taking the antibiotic as prescribed.  In spite of this, he has developed redness, pain, and swelling in his right leg.  He feels that he has infection.  No fever or chills.  Past Medical History:  Diagnosis Date  . Allergy   . First degree heart block   . Hyperlipidemia   . Hypertension   . REM sleep behavior disorder   . Substance abuse [facility])     Patient Active Problem List   Diagnosis Date Noted  . Uncontrolled REM sleep behavior disorder 02/11/2020  . REM sleep behavior disorder 11/26/2019  . Ataxia 11/26/2019  . De Quervain's tenosynovitis, right 10/07/2018  . Benign prostatic hyperplasia without lower urinary tract symptoms 01/18/2017  . Routine general medical examination at a health care facility 01/18/2017  . Essential hypertension 12/18/2014  . Hyperlipidemia with target LDL less than 100 12/18/2014  . First degree heart block 03/30/2013  . IMPAIRED FASTING GLUCOSE 06/30/2010  . GERD 06/26/2008  . ERECTILE DYSFUNCTION, MILD 01/30/2008  . VENTRICULAR HYPERTROPHY, LEFT 11/29/2006  . COLON POLYP 09/06/2006  . ASTHMA, EXERCISE INDUCED 09/06/2006    Past Surgical History:  Procedure Laterality Date  . INGUINAL HERNIA REPAIR Right 03/10/2015   Procedure: OPEN RIGHT INGUINAL HERNIA REPAIR;  Surgeon: Johnathan Hausen, MD;  Location: [facility];  Service: General;  Laterality: Right;  With MESH  . VASECTOMY         Home Medications    Prior to Admission medications   Medication Sig Start Date  End Date Taking? Authorizing Provider  amoxicillin-clavulanate (AUGMENTIN) 875-125 MG tablet Take 1 tablet by mouth every 12 (twelve) hours. 02/27/20   Raylene Everts, MD  aspirin EC 81 MG tablet Take 81 mg by mouth daily.    [provider]  atorvastatin (LIPITOR) 40 MG tablet TAKE ONE TABLET BY MOUTH DAILY 09/15/19   Troy Sine, MD  cetirizine (ZYRTEC) 10 MG tablet Take 10 mg by mouth as directed.     [provider]  Cholecalciferol (VITAMIN D) 2000 units CAPS Take 1 capsule by mouth daily.    [provider]  doxycycline (VIBRAMYCIN) 100 MG capsule Take 1 capsule (100 mg total) by mouth 2 (two) times daily for 7 days. 02/23/20 03/01/20  Robinson, [place] N, PA-C  ezetimibe (ZETIA) 10 MG tablet TAKE ONE TABLET BY MOUTH DAILY 12/11/19   Troy Sine, MD  losartan (COZAAR) 25 MG tablet TAKE ONE TABLET BY MOUTH DAILY 12/11/19   [name], MD  melatonin 3 MG TABS tablet Take 2 tablets (6 mg total) by mouth at bedtime. 11/26/19   [name], MD  Multiple Vitamin (MULTI-VITAMIN PO) Take 1 tablet by mouth.    [provider]  tadalafil (CIALIS) 20 MG tablet Use as directed. 01/13/16   Darlyne Russian, MD  losartan (COZAAR) 25 MG tablet TAKE ONE TABLET BY MOUTH DAILY 06/16/19   [name], MD  Family History Family History  Problem Relation Age of Onset  . Heart disease Mother   . Stroke Mother   . Heart disease Father 9       MI  . Hypertension Father   . Hypertension Brother   . Cancer Brother   . Diabetes Brother   . Heart disease Brother   . Hyperlipidemia Brother   . Cancer Brother   . Hyperlipidemia Sister   . Coronary artery disease Brother 27       CABG  . Colon cancer Paternal Grandmother     Social History Social History   Tobacco Use  . Smoking status: Former Smoker    Packs/day: 0.25    Years: 1.00    Pack years: 0.25    Quit date: 07/10/1992    Years since quitting: 27.6  . Smokeless tobacco: Never Used  Vaping  Use  . Vaping Use: Never used  Substance Use Topics  . Alcohol use: No    Alcohol/week: 0.0 standard drinks  . Drug use: Not Currently    Comment: sobrity for 10 years     Allergies   Nexium [esomeprazole magnesium]   Review of Systems Review of Systems See HPI  Physical Exam Triage Vital Signs ED Triage Vitals  Enc Vitals Group     BP 02/27/20 1014 136/82     Pulse Rate 02/27/20 1014 69     Resp 02/27/20 1014 16     Temp 02/27/20 1014 98.3 F (36.8 C)     Temp src --      SpO2 02/27/20 1014 100 %     Weight --      Height --      Head Circumference --      Peak Flow --      Pain Score 02/27/20 1015 0     Pain Loc --      Pain Edu? --      Excl. in [place]? --    No data found.  Updated Vital Signs BP 136/82   Pulse 69   Temp 98.3 F (36.8 C)   Resp 16   SpO2 100%      Physical Exam Constitutional:      General: He is not in acute distress.    Appearance: Normal appearance. He is well-developed and normal weight.  HENT:     Head: Normocephalic and atraumatic.     Mouth/Throat:     Comments: Mask is in place Eyes:     Conjunctiva/sclera: Conjunctivae normal.     Pupils: Pupils are equal, round, and reactive to light.  Cardiovascular:     Rate and Rhythm: Normal rate.  Pulmonary:     Effort: Pulmonary effort is normal. No respiratory distress.  Musculoskeletal:        General: Normal range of motion.     Cervical back: Normal range of motion.  Skin:    General: Skin is warm and dry.     Comments: See photographs  Neurological:     Mental Status: He is alert.  Psychiatric:        Behavior: Behavior normal.        Noted erythema and soft tissue swelling distal to the wounds down into the foot  UC Treatments / Results  Labs (all labs ordered are listed, but only abnormal results are displayed) Labs Reviewed - No data to display  EKG   Radiology No results found.  Procedures Procedures (including critical care time)  Medications  Ordered  in UC Medications - No data to display  Initial Impression / Assessment and Plan / UC Course  I have reviewed the triage vital signs and the nursing notes.  Pertinent labs & imaging results that were available during my care of the patient were reviewed by me and considered in my medical decision making (see chart for details).      Final Clinical Impressions(s) / UC Diagnoses   Final diagnoses:  Wound cellulitis after surgery  Visit for wound check     Discharge Instructions     I am adding Augmentin for the additional antimicrobial coverage Consider taking a probiotic with the Augmentin See your doctor on Tuesday Call or return sooner if worse at any time     ED Prescriptions    Medication Sig Dispense Auth. Provider   amoxicillin-clavulanate (AUGMENTIN) 875-125 MG tablet Take 1 tablet by mouth every 12 (twelve) hours. 14 tablet Raylene Everts, MD     PDMP not reviewed this encounter.   Raylene Everts, MD 02/27/20 2226

## 2020-02-27 NOTE — ED Triage Notes (Signed)
Pt had laceration repair on right lower leg on 8/16, patient is concerned wound is infected. Currently on doxycycline

## 2020-02-27 NOTE — Discharge Instructions (Signed)
I am adding Augmentin for the additional antimicrobial coverage Consider taking a probiotic with the Augmentin See your doctor on Tuesday Call or return sooner if worse at any time

## 2020-03-02 ENCOUNTER — Ambulatory Visit (INDEPENDENT_AMBULATORY_CARE_PROVIDER_SITE_OTHER): Payer: PPO | Admitting: Internal Medicine

## 2020-03-02 ENCOUNTER — Encounter: Payer: Self-pay | Admitting: Internal Medicine

## 2020-03-02 ENCOUNTER — Other Ambulatory Visit: Payer: Self-pay

## 2020-03-02 VITALS — BP 126/84 | HR 77 | Temp 98.6°F | Resp 16 | Ht 74.0 in | Wt 193.0 lb

## 2020-03-02 DIAGNOSIS — I10 Essential (primary) hypertension: Secondary | ICD-10-CM | POA: Diagnosis not present

## 2020-03-02 DIAGNOSIS — R7303 Prediabetes: Secondary | ICD-10-CM | POA: Diagnosis not present

## 2020-03-02 DIAGNOSIS — L089 Local infection of the skin and subcutaneous tissue, unspecified: Secondary | ICD-10-CM | POA: Insufficient documentation

## 2020-03-02 DIAGNOSIS — T148XXA Other injury of unspecified body region, initial encounter: Secondary | ICD-10-CM

## 2020-03-02 NOTE — Progress Notes (Signed)
Subjective:  Patient ID: Clarence Ware, male    DOB: Nov 29, 1950  Age: 69 y.o. MRN: [id number]  CC: Hypertension and Wound Infection  This visit occurred during the SARS-CoV-2 public health emergency.  Safety protocols were in place, including screening questions prior to the visit, additional usage of staff PPE, and extensive cleaning of exam room while observing appropriate contact time as indicated for disinfecting solutions.    HPI Clarence Ware presents for a wound check - He fell about 8 days ago and injured his anterior right lower leg.  He was seen in the emergency department and had a complex, layered closure of the wound.  He developed signs of a wound infection so he was treated with doxycycline, the wound infection did not get much better so he was later seen at an urgent care center and has been taking Augmentin.  He states that the area feels better with less pain, redness, and swelling since he is started taking the Augmentin.  Outpatient Medications Prior to Visit  Medication Sig Dispense Refill  . amoxicillin-clavulanate (AUGMENTIN) 875-125 MG tablet Take 1 tablet by mouth every 12 (twelve) hours. 14 tablet 0  . aspirin EC 81 MG tablet Take 81 mg by mouth daily.    Marland Kitchen atorvastatin (LIPITOR) 40 MG tablet TAKE ONE TABLET BY MOUTH DAILY 58 tablet 8  . cetirizine (ZYRTEC) 10 MG tablet Take 10 mg by mouth as directed.     . Cholecalciferol (VITAMIN D) 2000 units CAPS Take 1 capsule by mouth daily.    Marland Kitchen ezetimibe (ZETIA) 10 MG tablet TAKE ONE TABLET BY MOUTH DAILY 90 tablet 0  . losartan (COZAAR) 25 MG tablet TAKE ONE TABLET BY MOUTH DAILY 90 tablet 0  . melatonin 3 MG TABS tablet Take 2 tablets (6 mg total) by mouth at bedtime. (Patient taking differently: Take 3 mg by mouth at bedtime. ) 180 tablet 1  . Multiple Vitamin (MULTI-VITAMIN PO) Take 1 tablet by mouth.    . tadalafil (CIALIS) 20 MG tablet Use as directed. 6 tablet 11   No facility-administered medications prior  to visit.    ROS Review of Systems  Constitutional: Negative for chills, fatigue and fever.  HENT: Negative.   Eyes: Negative for visual disturbance.  Respiratory: Negative for cough and chest tightness.   Cardiovascular: Negative for chest pain, palpitations and leg swelling.  Gastrointestinal: Negative for abdominal pain, constipation, diarrhea, nausea and vomiting.  Endocrine: Negative.   Genitourinary: Negative.  Negative for difficulty urinating.  Musculoskeletal: Negative for arthralgias and myalgias.  Skin: Positive for color change and wound.  Neurological: Negative.  Negative for dizziness and weakness.  Hematological: Negative for adenopathy. Does not bruise/bleed easily.  Psychiatric/Behavioral: Negative.     Objective:  BP 126/84 (BP Location: Left Arm, Patient Position: Sitting, Cuff Size: Normal)   Pulse 77   Temp 98.6 F (37 C) (Oral)   Resp 16   Ht 6\' 2"  (1.88 m)   Wt 193 lb (87.5 kg)   SpO2 97%   BMI 24.78 kg/m   BP Readings from Last 3 Encounters:  03/02/20 126/84  02/27/20 136/82  02/23/20 (!) 150/89    Wt Readings from Last 3 Encounters:  03/02/20 193 lb (87.5 kg)  02/23/20 189 lb (85.7 kg)  12/15/19 189 lb 12.8 oz (86.1 kg)    Physical Exam Vitals reviewed.  Constitutional:      Appearance: Normal appearance.  HENT:     Nose: Nose normal.     Mouth/Throat:  Mouth: Mucous membranes are moist.  Eyes:     General: No scleral icterus.    Conjunctiva/sclera: Conjunctivae normal.  Cardiovascular:     Rate and Rhythm: Normal rate and regular rhythm.     Heart sounds: No murmur heard.   Pulmonary:     Effort: Pulmonary effort is normal.     Breath sounds: No stridor. No wheezing, rhonchi or rales.  Abdominal:     General: Abdomen is flat.     Palpations: There is no mass.     Tenderness: There is no abdominal tenderness. There is no guarding.  Musculoskeletal:        General: Normal range of motion.     Cervical back: Neck supple.      Right lower leg: No edema.     Left lower leg: No edema.       Legs:  Lymphadenopathy:     Cervical: No cervical adenopathy.  Skin:    General: Skin is warm and dry.  Neurological:     Mental Status: He is alert.     Lab Results  Component Value Date   WBC 5.3 07/22/2019   HGB 14.5 07/22/2019   HCT 43.0 07/22/2019   PLT 194.0 07/22/2019   GLUCOSE 98 03/02/2020   CHOL 109 07/22/2019   TRIG 60.0 07/22/2019   HDL 41.70 07/22/2019   LDLCALC 55 07/22/2019   ALT 34 07/22/2019   AST 31 07/22/2019   NA 139 03/02/2020   K 4.4 03/02/2020   CL 104 03/02/2020   CREATININE 0.91 03/02/2020   BUN 20 03/02/2020   CO2 25 03/02/2020   TSH 1.77 07/22/2019   PSA 1.88 07/22/2018   HGBA1C 5.8 (H) 03/02/2020      No results found.  Assessment & Plan:   Clarence Ware was seen today for hypertension and wound infection.  Diagnoses and all orders for this visit:  Essential hypertension- His blood pressure is well controlled.  Electrolytes and renal function are normal. -     BASIC METABOLIC PANEL WITH GFR; Future -     BASIC METABOLIC PANEL WITH GFR  Prediabetes- His A1c has improved with lifestyle modifications. -     BASIC METABOLIC PANEL WITH GFR; Future -     Hemoglobin A1c; Future -     Hemoglobin A1c -     BASIC METABOLIC PANEL WITH GFR  Wound infection, posttraumatic- He tells me the area is improving on Augmentin.  A wound culture is pending.  Will change the antibiotic regimen if indicated. -     WOUND CULTURE; Future -     WOUND CULTURE   I am having Clarence Ware "Clarence Ware" maintain his tadalafil, cetirizine, Vitamin D, aspirin EC, Multiple Vitamin (MULTI-VITAMIN PO), atorvastatin, melatonin, losartan, ezetimibe, and amoxicillin-clavulanate.  No orders of the defined types were placed in this encounter.    Follow-up: Return in about 3 days (around 03/05/2020).  Clarence Calico, MD

## 2020-03-02 NOTE — Patient Instructions (Signed)
Wound Infection °A wound infection happens when tiny organisms (microorganisms) start to grow in a wound. A wound infection is most often caused by bacteria. Infection can cause the wound to break open or worsen. Wound infection needs treatment. If a wound infection is left untreated, complications can occur. Untreated wound infections may lead to an infection in the bloodstream (septicemia) or a bone infection (osteomyelitis). °What are the causes? °This condition is most often caused by bacteria growing in a wound. Other microorganisms, like yeast and fungi, can also cause wound infections. °What increases the risk? °The following factors may make you more likely to develop this condition: °· Having a weak body defense system (immune system). °· Having diabetes. °· Taking steroid medicines for a long time (chronic use). °· Smoking. °· Being an older person. °· Being overweight. °· Taking chemotherapy medicines. °What are the signs or symptoms? °Symptoms of this condition include: °· Having more redness, swelling, or pain at the wound site. °· Having more blood or fluid at the wound site. °· A bad smell coming from a wound or bandage (dressing). °· Having a fever. °· Feeling tired or fatigued. °· Having warmth at or around the wound. °· Having pus at the wound site. °How is this diagnosed? °This condition is diagnosed with a medical history and physical exam. You may also have a wound culture or blood tests or both. °How is this treated? °This condition is usually treated with an antibiotic medicine. °· The infection should improve 24-48 hours after you start antibiotics. °· After 24-48 hours, redness around the wound should stop spreading, and the wound should be less painful. °Follow these instructions at home: °Medicines °· Take or apply over-the-counter and prescription medicines only as told by your health care provider. °· If you were prescribed an antibiotic medicine, take or apply it as told by your health  care provider. Do not stop using the antibiotic even if you start to feel better. °Wound care ° °· Clean the wound each day, or as told by your health care provider. °? Wash the wound with mild soap and water. °? Rinse the wound with water to remove all soap. °? Pat the wound dry with a clean towel. Do not rub it. °· Follow instructions from your health care provider about how to take care of your wound. Make sure you: °? Wash your hands with soap and water before and after you change your dressing. If soap and water are not available, use hand sanitizer. °? Change your dressing as told by your health care provider. °? Leave stitches (sutures), skin glue, or adhesive strips in place if your wound has been closed. These skin closures may need to stay in place for 2 weeks or longer. If adhesive strip edges start to loosen and curl up, you may trim the loose edges. Do not remove adhesive strips completely unless your health care provider tells you to do that. Some wounds are left open to heal on their own. °· Check your wound every day for signs of infection. Watch for: °? More redness, swelling, or pain. °? More fluid or blood. °? Warmth. °? Pus or a bad smell. °General instructions °· Keep the dressing dry until your health care provider says it can be removed. °· Do not take baths, swim, or use a hot tub until your health care provider approves. Ask your health care provider if you may take showers. You may only be allowed to take sponge baths. °· Raise (elevate)   the injured area above the level of your heart while you are sitting or lying down. °· Do not scratch or pick at the wound. °· Keep all follow-up visits as told by your health care provider. This is important. °Contact a health care provider if: °· Your pain is not controlled with medicine. °· You have more redness, swelling, or pain around your wound. °· You have more fluid or blood coming from your wound. °· Your wound feels warm to the touch. °· You have  pus coming from your wound. °· You continue to notice a bad smell coming from your wound or your dressing. °· Your wound that was closed breaks open. °Get help right away if: °· You have a red streak going away from your wound. °· You have a fever. °Summary °· A wound infection happens when tiny organisms (microorganisms) start to grow in a wound. °· This condition is usually treated with an antibiotic medicine. °· Follow instructions from your health care provider about how to take care of your wound. °· Contact a health care provider if your wound infection does not begin to improve in 24-48 hours, or your symptoms worsen. °· Keep all follow-up visits as told by your health care provider. This is important. °This information is not intended to replace advice given to you by your health care provider. Make sure you discuss any questions you have with your health care provider. °Document Revised: 02/05/2018 Document Reviewed: 02/05/2018 °Elsevier Patient Education © 2020 [employer] ° °

## 2020-03-03 LAB — BASIC METABOLIC PANEL WITH GFR
BUN: 20 mg/dL (ref 7–25)
CO2: 25 mmol/L (ref 20–32)
Calcium: 9.2 mg/dL (ref 8.6–10.3)
Chloride: 104 mmol/L (ref 98–110)
Creat: 0.91 mg/dL (ref 0.70–1.25)
GFR, Est African American: 99 mL/min/{1.73_m2} (ref >=60)
GFR, Est Non African American: 86 mL/min/{1.73_m2} (ref >=60)
Glucose, Bld: 98 mg/dL (ref 65–99)
Potassium: 4.4 mmol/L (ref 3.5–5.3)
Sodium: 139 mmol/L (ref 135–146)

## 2020-03-03 LAB — HEMOGLOBIN A1C
Hgb A1c MFr Bld: 5.8 % of total Hgb — ABNORMAL HIGH (ref <5.7)
Mean Plasma Glucose: 120 (calc)
eAG (mmol/L): 6.6 (calc)

## 2020-03-05 ENCOUNTER — Ambulatory Visit (INDEPENDENT_AMBULATORY_CARE_PROVIDER_SITE_OTHER): Payer: PPO | Admitting: Internal Medicine

## 2020-03-05 ENCOUNTER — Encounter: Payer: Self-pay | Admitting: Internal Medicine

## 2020-03-05 ENCOUNTER — Other Ambulatory Visit: Payer: Self-pay

## 2020-03-05 VITALS — BP 120/90 | HR 64 | Temp 97.7°F | Ht 74.0 in | Wt 193.0 lb

## 2020-03-05 DIAGNOSIS — L089 Local infection of the skin and subcutaneous tissue, unspecified: Secondary | ICD-10-CM

## 2020-03-05 DIAGNOSIS — T148XXA Other injury of unspecified body region, initial encounter: Secondary | ICD-10-CM

## 2020-03-05 LAB — WOUND CULTURE

## 2020-03-05 MED ORDER — NUZYRA 150 MG PO TABS: 3.0000 | ORAL_TABLET | Freq: Every day | ORAL | 0 refills | Status: AC

## 2020-03-05 MED ORDER — NUZYRA 150 MG PO TABS: 2.0000 | ORAL_TABLET | Freq: Every day | ORAL | 0 refills | Status: AC

## 2020-03-05 NOTE — Patient Instructions (Signed)
Wound Infection °A wound infection happens when tiny organisms (microorganisms) start to grow in a wound. A wound infection is most often caused by bacteria. Infection can cause the wound to break open or worsen. Wound infection needs treatment. If a wound infection is left untreated, complications can occur. Untreated wound infections may lead to an infection in the bloodstream (septicemia) or a bone infection (osteomyelitis). °What are the causes? °This condition is most often caused by bacteria growing in a wound. Other microorganisms, like yeast and fungi, can also cause wound infections. °What increases the risk? °The following factors may make you more likely to develop this condition: °· Having a weak body defense system (immune system). °· Having diabetes. °· Taking steroid medicines for a long time (chronic use). °· Smoking. °· Being an older person. °· Being overweight. °· Taking chemotherapy medicines. °What are the signs or symptoms? °Symptoms of this condition include: °· Having more redness, swelling, or pain at the wound site. °· Having more blood or fluid at the wound site. °· A bad smell coming from a wound or bandage (dressing). °· Having a fever. °· Feeling tired or fatigued. °· Having warmth at or around the wound. °· Having pus at the wound site. °How is this diagnosed? °This condition is diagnosed with a medical history and physical exam. You may also have a wound culture or blood tests or both. °How is this treated? °This condition is usually treated with an antibiotic medicine. °· The infection should improve 24-48 hours after you start antibiotics. °· After 24-48 hours, redness around the wound should stop spreading, and the wound should be less painful. °Follow these instructions at home: °Medicines °· Take or apply over-the-counter and prescription medicines only as told by your health care provider. °· If you were prescribed an antibiotic medicine, take or apply it as told by your health  care provider. Do not stop using the antibiotic even if you start to feel better. °Wound care ° °· Clean the wound each day, or as told by your health care provider. °? Wash the wound with mild soap and water. °? Rinse the wound with water to remove all soap. °? Pat the wound dry with a clean towel. Do not rub it. °· Follow instructions from your health care provider about how to take care of your wound. Make sure you: °? Wash your hands with soap and water before and after you change your dressing. If soap and water are not available, use hand sanitizer. °? Change your dressing as told by your health care provider. °? Leave stitches (sutures), skin glue, or adhesive strips in place if your wound has been closed. These skin closures may need to stay in place for 2 weeks or longer. If adhesive strip edges start to loosen and curl up, you may trim the loose edges. Do not remove adhesive strips completely unless your health care provider tells you to do that. Some wounds are left open to heal on their own. °· Check your wound every day for signs of infection. Watch for: °? More redness, swelling, or pain. °? More fluid or blood. °? Warmth. °? Pus or a bad smell. °General instructions °· Keep the dressing dry until your health care provider says it can be removed. °· Do not take baths, swim, or use a hot tub until your health care provider approves. Ask your health care provider if you may take showers. You may only be allowed to take sponge baths. °· Raise (elevate)   the injured area above the level of your heart while you are sitting or lying down. °· Do not scratch or pick at the wound. °· Keep all follow-up visits as told by your health care provider. This is important. °Contact a health care provider if: °· Your pain is not controlled with medicine. °· You have more redness, swelling, or pain around your wound. °· You have more fluid or blood coming from your wound. °· Your wound feels warm to the touch. °· You have  pus coming from your wound. °· You continue to notice a bad smell coming from your wound or your dressing. °· Your wound that was closed breaks open. °Get help right away if: °· You have a red streak going away from your wound. °· You have a fever. °Summary °· A wound infection happens when tiny organisms (microorganisms) start to grow in a wound. °· This condition is usually treated with an antibiotic medicine. °· Follow instructions from your health care provider about how to take care of your wound. °· Contact a health care provider if your wound infection does not begin to improve in 24-48 hours, or your symptoms worsen. °· Keep all follow-up visits as told by your health care provider. This is important. °This information is not intended to replace advice given to you by your health care provider. Make sure you discuss any questions you have with your health care provider. °Document Revised: 02/05/2018 Document Reviewed: 02/05/2018 °Elsevier Patient Education © 2020 [employer] ° °

## 2020-03-05 NOTE — Progress Notes (Addendum)
Subjective:  Patient ID: Clarence Ware, male    DOB: Apr 24, 1951  Age: 69 y.o. MRN: [id number]  CC: Wound Check  This visit occurred during the SARS-CoV-2 public health emergency.  Safety protocols were in place, including screening questions prior to the visit, additional usage of staff PPE, and extensive cleaning of exam room while observing appropriate contact time as indicated for disinfecting solutions.    HPI Clarence Ware presents for f/up - The wound on his right lower extremity is better but he still complains of pain and redness.  Distal to this there is some swelling.  He has been working for the last 2 to 3 days and has been upright for most of the day.  He is on his last day of Augmentin.  He denies rash, nausea, vomiting, fever, or chills.  Outpatient Medications Prior to Visit  Medication Sig Dispense Refill  . aspirin EC 81 MG tablet Take 81 mg by mouth daily.    Marland Kitchen atorvastatin (LIPITOR) 40 MG tablet TAKE ONE TABLET BY MOUTH DAILY 58 tablet 8  . cetirizine (ZYRTEC) 10 MG tablet Take 10 mg by mouth as directed.     . Cholecalciferol (VITAMIN D) 2000 units CAPS Take 1 capsule by mouth daily.    Marland Kitchen ezetimibe (ZETIA) 10 MG tablet TAKE ONE TABLET BY MOUTH DAILY 90 tablet 0  . losartan (COZAAR) 25 MG tablet TAKE ONE TABLET BY MOUTH DAILY 90 tablet 0  . melatonin 3 MG TABS tablet Take 2 tablets (6 mg total) by mouth at bedtime. (Patient taking differently: Take 3 mg by mouth at bedtime. ) 180 tablet 1  . Multiple Vitamin (MULTI-VITAMIN PO) Take 1 tablet by mouth.    . tadalafil (CIALIS) 20 MG tablet Use as directed. 6 tablet 11  . amoxicillin-clavulanate (AUGMENTIN) 875-125 MG tablet Take 1 tablet by mouth every 12 (twelve) hours. 14 tablet 0   No facility-administered medications prior to visit.    ROS Review of Systems  Constitutional: Negative for chills, fatigue and fever.  HENT: Negative.   Eyes: Negative.   Respiratory: Negative for cough, chest tightness  and shortness of breath.   Cardiovascular: Negative for chest pain, palpitations and leg swelling.  Gastrointestinal: Negative for abdominal pain and nausea.  Endocrine: Negative.   Genitourinary: Negative.  Negative for difficulty urinating.  Musculoskeletal: Negative for arthralgias and myalgias.  Skin: Positive for wound. Negative for rash.  Neurological: Negative.  Negative for dizziness and weakness.  Hematological: Negative for adenopathy. Does not bruise/bleed easily.  Psychiatric/Behavioral: Negative.     Objective:  BP 120/90 (BP Location: Left Arm, Patient Position: Sitting, Cuff Size: Normal)   Pulse 64   Temp 97.7 F (36.5 C) (Oral)   Ht 6\' 2"  (1.88 m)   Wt 193 lb (87.5 kg)   SpO2 99%   BMI 24.78 kg/m   BP Readings from Last 3 Encounters:  03/05/20 120/90  03/02/20 126/84  02/27/20 136/82    Wt Readings from Last 3 Encounters:  03/05/20 193 lb (87.5 kg)  03/02/20 193 lb (87.5 kg)  02/23/20 189 lb (85.7 kg)    Physical Exam Musculoskeletal:       Legs:     Lab Results  Component Value Date   WBC 5.3 07/22/2019   HGB 14.5 07/22/2019   HCT 43.0 07/22/2019   PLT 194.0 07/22/2019   GLUCOSE 98 03/02/2020   CHOL 109 07/22/2019   TRIG 60.0 07/22/2019   HDL 41.70 07/22/2019  LDLCALC 55 07/22/2019   ALT 34 07/22/2019   AST 31 07/22/2019   NA 139 03/02/2020   K 4.4 03/02/2020   CL 104 03/02/2020   CREATININE 0.91 03/02/2020   BUN 20 03/02/2020   CO2 25 03/02/2020   TSH 1.77 07/22/2019   PSA 1.88 07/22/2018   HGBA1C 5.8 (H) 03/02/2020    A mix of non-predominating organisms of questionable significance was recovered on culture and not further identified. (Note: Growth did not detect the presence of S.aureus, beta-hemolytic Streptococci or P.aeruginosa).  Assessment & Plan:   Clarence Ware was seen today for wound check.  Diagnoses and all orders for this visit:  Wound infection, posttraumatic- Sutures removed.  The wound has not completely healed.   I recommended wound care and elevation of the right lower extremity with secondary closure.  The recent culture was unremarkable.  I am concerned there is some persistent wound infection so I have asked him to take a 10-day course of omadacycline for broad coverage.     Omadacycline Tosylate (NUZYRA) 150 MG TABS; Take 3 tablets by mouth daily for 2 days. -     Omadacycline Tosylate (NUZYRA) 150 MG TABS; Take 2 tablets by mouth daily for 7 days.   I have discontinued Clarence Ware "[name]"'s amoxicillin-clavulanate. I am also having him start on [place]. Additionally, I am having him maintain his tadalafil, cetirizine, Vitamin D, aspirin EC, Multiple Vitamin (MULTI-VITAMIN PO), atorvastatin, melatonin, losartan, and ezetimibe.  Meds ordered this encounter  Medications  . Omadacycline Tosylate (NUZYRA) 150 MG TABS    Sig: Take 3 tablets by mouth daily for 2 days.    Dispense:  12 tablet    Refill:  0  . Omadacycline Tosylate (NUZYRA) 150 MG TABS    Sig: Take 2 tablets by mouth daily for 7 days.    Dispense:  14 tablet    Refill:  0     Follow-up: Return in about 1 week (around 03/12/2020).  [name], MD

## 2020-03-08 ENCOUNTER — Telehealth: Payer: Self-pay

## 2020-03-08 ENCOUNTER — Other Ambulatory Visit: Payer: Self-pay | Admitting: Internal Medicine

## 2020-03-08 ENCOUNTER — Other Ambulatory Visit: Payer: Self-pay | Admitting: Cardiovascular Disease

## 2020-03-08 DIAGNOSIS — I10 Essential (primary) hypertension: Secondary | ICD-10-CM

## 2020-03-08 NOTE — Telephone Encounter (Signed)
Unable to fill prescription d/t needing prior authorization. Also a limited distribution drug, so it will take some time to get it even after PA.  Will forward to Dr Ronnald Ramp & assistant to determine need for Elesa Hacker (PA will need to be completed) or a different abx.  Pharmacy would like follow up call at [phone] to be kept updated with plan for this pt & medication.

## 2020-03-09 NOTE — Telephone Encounter (Signed)
°  Follow up message    Pharmacy calling to report will have no samples remaining after today. Requesting status of prior auth

## 2020-03-09 NOTE — Telephone Encounter (Signed)
Prior was approved.

## 2020-03-09 NOTE — Telephone Encounter (Signed)
Key: [id number]  Clinical documentation completed - request for expedited PA.   Faxes may come through requesting more information.

## 2020-03-11 ENCOUNTER — Ambulatory Visit (INDEPENDENT_AMBULATORY_CARE_PROVIDER_SITE_OTHER): Payer: PPO | Admitting: Internal Medicine

## 2020-03-11 ENCOUNTER — Other Ambulatory Visit: Payer: Self-pay

## 2020-03-11 ENCOUNTER — Encounter: Payer: Self-pay | Admitting: Internal Medicine

## 2020-03-11 VITALS — BP 122/78 | HR 79 | Temp 98.5°F | Resp 16 | Ht 74.0 in | Wt 193.0 lb

## 2020-03-11 DIAGNOSIS — I10 Essential (primary) hypertension: Secondary | ICD-10-CM

## 2020-03-11 DIAGNOSIS — T148XXA Other injury of unspecified body region, initial encounter: Secondary | ICD-10-CM | POA: Diagnosis not present

## 2020-03-11 DIAGNOSIS — L089 Local infection of the skin and subcutaneous tissue, unspecified: Secondary | ICD-10-CM

## 2020-03-11 NOTE — Progress Notes (Signed)
Subjective:  Patient ID: Clarence Ware, male    DOB: 02/19/1951  Age: 69 y.o. MRN: [id number]  CC: Hypertension and Wound Check  This visit occurred during the SARS-CoV-2 public health emergency.  Safety protocols were in place, including screening questions prior to the visit, additional usage of staff PPE, and extensive cleaning of exam room while observing appropriate contact time as indicated for disinfecting solutions.    HPI Clarence Ware presents for f/up- He reports that the wound is closing up nicely and is feeling much better.  He denies pain, redness, swelling, or drainage.  He is tolerating omadacycline well with no rash, nausea, abdominal pain, or diarrhea.  Outpatient Medications Prior to Visit  Medication Sig Dispense Refill  . aspirin EC 81 MG tablet Take 81 mg by mouth daily.    Marland Kitchen atorvastatin (LIPITOR) 40 MG tablet TAKE ONE TABLET BY MOUTH DAILY 58 tablet 8  . cetirizine (ZYRTEC) 10 MG tablet Take 10 mg by mouth as directed.     . Cholecalciferol (VITAMIN D) 2000 units CAPS Take 1 capsule by mouth daily.    Marland Kitchen ezetimibe (ZETIA) 10 MG tablet TAKE ONE TABLET BY MOUTH DAILY 90 tablet 0  . losartan (COZAAR) 25 MG tablet TAKE ONE TABLET BY MOUTH DAILY 90 tablet 0  . melatonin 3 MG TABS tablet Take 2 tablets (6 mg total) by mouth at bedtime. (Patient taking differently: Take 3 mg by mouth at bedtime. ) 180 tablet 1  . Multiple Vitamin (MULTI-VITAMIN PO) Take 1 tablet by mouth.    Marland Kitchen Omadacycline Tosylate (NUZYRA) 150 MG TABS Take 2 tablets by mouth daily for 7 days. 14 tablet 0  . tadalafil (CIALIS) 20 MG tablet Use as directed. 6 tablet 11   No facility-administered medications prior to visit.    ROS Review of Systems  Constitutional: Negative.  Negative for chills, fatigue and fever.  HENT: Negative.   Eyes: Negative for visual disturbance.  Respiratory: Negative for cough, chest tightness, shortness of breath and wheezing.   Cardiovascular: Negative for  chest pain, palpitations and leg swelling.  Gastrointestinal: Negative for abdominal pain, constipation, diarrhea, nausea and vomiting.  Endocrine: Negative.   Genitourinary: Negative.  Negative for difficulty urinating.  Musculoskeletal: Negative for arthralgias and myalgias.  Skin: Positive for wound. Negative for color change.  Neurological: Negative.  Negative for dizziness, weakness and light-headedness.  Hematological: Negative for adenopathy. Does not bruise/bleed easily.  Psychiatric/Behavioral: Negative.     Objective:  BP 122/78   Pulse 79   Temp 98.5 F (36.9 C) (Oral)   Resp 16   Ht 6\' 2"  (1.88 m)   Wt 193 lb (87.5 kg)   SpO2 98%   BMI 24.78 kg/m   BP Readings from Last 3 Encounters:  03/11/20 122/78  03/05/20 120/90  03/02/20 126/84    Wt Readings from Last 3 Encounters:  03/11/20 193 lb (87.5 kg)  03/05/20 193 lb (87.5 kg)  03/02/20 193 lb (87.5 kg)    Physical Exam Vitals reviewed.  Constitutional:      Appearance: Normal appearance.  HENT:     Nose: Nose normal.     Mouth/Throat:     Mouth: Mucous membranes are moist.  Eyes:     General: No scleral icterus.    Conjunctiva/sclera: Conjunctivae normal.  Cardiovascular:     Rate and Rhythm: Normal rate and regular rhythm.     Heart sounds: No murmur heard.   Pulmonary:     Effort: Pulmonary effort  is normal.     Breath sounds: No stridor. No wheezing, rhonchi or rales.  Abdominal:     General: Abdomen is flat.     Palpations: There is no mass.     Tenderness: There is no abdominal tenderness. There is no guarding.  Musculoskeletal:        General: Normal range of motion.     Cervical back: Neck supple.     Right lower leg: No edema.     Left lower leg: No edema.       Legs:  Lymphadenopathy:     Cervical: No cervical adenopathy.  Skin:    General: Skin is warm and dry.  Neurological:     Mental Status: He is alert.     Lab Results  Component Value Date   WBC 5.3 07/22/2019   HGB  14.5 07/22/2019   HCT 43.0 07/22/2019   PLT 194.0 07/22/2019   GLUCOSE 98 03/02/2020   CHOL 109 07/22/2019   TRIG 60.0 07/22/2019   HDL 41.70 07/22/2019   LDLCALC 55 07/22/2019   ALT 34 07/22/2019   AST 31 07/22/2019   NA 139 03/02/2020   K 4.4 03/02/2020   CL 104 03/02/2020   CREATININE 0.91 03/02/2020   BUN 20 03/02/2020   CO2 25 03/02/2020   TSH 1.77 07/22/2019   PSA 1.88 07/22/2018   HGBA1C 5.8 (H) 03/02/2020    No results found.  Assessment & Plan:   Clarence Ware was seen today for hypertension and wound check.  Diagnoses and all orders for this visit:  Essential hypertension- His blood pressure is well controlled.  Wound infection, posttraumatic- Marked improvement noted.  He will complete the course of antibiotics and will continue to provide local wound care.  He will let me know if he develops any new or worsening symptoms.   I am having Clarence Ware "[name]" maintain his tadalafil, cetirizine, Vitamin D, aspirin EC, Multiple Vitamin (MULTI-VITAMIN PO), atorvastatin, melatonin, Nuzyra, losartan, and ezetimibe.  No orders of the defined types were placed in this encounter.    Follow-up: No follow-ups on file.  [name], MD

## 2020-03-14 ENCOUNTER — Encounter: Payer: Self-pay | Admitting: Internal Medicine

## 2020-03-14 NOTE — Patient Instructions (Signed)
Wound Care, Adult Taking care of your wound properly can help to prevent pain, infection, and scarring. It can also help your wound to heal more quickly. How to care for your wound Wound care      Follow instructions from your health care provider about how to take care of your wound. Make sure you: ? Wash your hands with soap and water before you change the bandage (dressing). If soap and water are not available, use hand sanitizer. ? Change your dressing as told by your health care provider. ? Leave stitches (sutures), skin glue, or adhesive strips in place. These skin closures may need to stay in place for 2 weeks or longer. If adhesive strip edges start to loosen and curl up, you may trim the loose edges. Do not remove adhesive strips completely unless your health care provider tells you to do that.  Check your wound area every day for signs of infection. Check for: ? Redness, swelling, or pain. ? Fluid or blood. ? Warmth. ? Pus or a bad smell.  Ask your health care provider if you should clean the wound with mild soap and water. Doing this may include: ? Using a clean towel to pat the wound dry after cleaning it. Do not rub or scrub the wound. ? Applying a cream or ointment. Do this only as told by your health care provider. ? Covering the incision with a clean dressing.  Ask your health care provider when you can leave the wound uncovered.  Keep the dressing dry until your health care provider says it can be removed. Do not take baths, swim, use a hot tub, or do anything that would put the wound underwater until your health care provider approves. Ask your health care provider if you can take showers. You may only be allowed to take sponge baths. Medicines   If you were prescribed an antibiotic medicine, cream, or ointment, take or use the antibiotic as told by your health care provider. Do not stop taking or using the antibiotic even if your condition improves.  Take  over-the-counter and prescription medicines only as told by your health care provider. If you were prescribed pain medicine, take it 30 or more minutes before you do any wound care or as told by your health care provider. General instructions  Return to your normal activities as told by your health care provider. Ask your health care provider what activities are safe.  Do not scratch or pick at the wound.  Do not use any products that contain nicotine or tobacco, such as cigarettes and e-cigarettes. These may delay wound healing. If you need help quitting, ask your health care provider.  Keep all follow-up visits as told by your health care provider. This is important.  Eat a diet that includes protein, vitamin A, vitamin C, and other nutrient-rich foods to help the wound heal. ? Foods rich in protein include meat, dairy, beans, nuts, and other sources. ? Foods rich in vitamin A include carrots and dark green, leafy vegetables. ? Foods rich in vitamin C include citrus, tomatoes, and other fruits and vegetables. ? Nutrient-rich foods have protein, carbohydrates, fat, vitamins, or minerals. Eat a variety of healthy foods including vegetables, fruits, and whole grains. Contact a health care provider if:  You received a tetanus shot and you have swelling, severe pain, redness, or bleeding at the injection site.  Your pain is not controlled with medicine.  You have redness, swelling, or pain around the wound.    You have fluid or blood coming from the wound.  Your wound feels warm to the touch.  You have pus or a bad smell coming from the wound.  You have a fever or chills.  You are nauseous or you vomit.  You are dizzy. Get help right away if:  You have a red streak going away from your wound.  The edges of the wound open up and separate.  Your wound is bleeding, and the bleeding does not stop with gentle pressure.  You have a rash.  You faint.  You have trouble  breathing. Summary  Always wash your hands with soap and water before changing your bandage (dressing).  To help with healing, eat foods that are rich in protein, vitamin A, vitamin C, and other nutrients.  Check your wound every day for signs of infection. Contact your health care provider if you suspect that your wound is infected. This information is not intended to replace advice given to you by your health care provider. Make sure you discuss any questions you have with your health care provider. Document Revised: 10/14/2018 Document Reviewed: 01/11/2016 Elsevier Patient Education  2020 [employer]  

## 2020-03-16 ENCOUNTER — Other Ambulatory Visit: Payer: Self-pay

## 2020-03-16 ENCOUNTER — Encounter: Payer: Self-pay | Admitting: Neurology

## 2020-03-16 ENCOUNTER — Ambulatory Visit: Payer: PPO | Admitting: Neurology

## 2020-03-16 VITALS — BP 142/88 | HR 69 | Ht 74.5 in | Wt 195.0 lb

## 2020-03-16 DIAGNOSIS — G4752 REM sleep behavior disorder: Secondary | ICD-10-CM

## 2020-03-16 DIAGNOSIS — G44029 Chronic cluster headache, not intractable: Secondary | ICD-10-CM | POA: Insufficient documentation

## 2020-03-16 MED ORDER — CLONAZEPAM 0.5 MG PO TABS: 0.5000 mg | ORAL_TABLET | Freq: Every evening | ORAL | 0 refills | Status: DC | PRN

## 2020-03-16 NOTE — Patient Instructions (Signed)
)    UARS - uses a dental device ( made to measure with Dr Ron Parker). The patient has gained control of his UARS, snoring and very mild apnea, AHI was 2.5/h in 5/ 2017 . AHI was now 0.5/h and RDI was not scored.   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 Usually stays in bed , thrashes and yells, but recently has fallen out of bed. Melatonin was initiated most recently 3 weeks ago, but he still yells and screams, has not fallen out of bed since.  Continues with melatonin, may use klonopin for extreme situations.     Plan:  Treatment plan and additional workup :  Starting Klonopin only  if Melatonin fails to control -  substance abuse history and would not like to start this medication, for obvious reasons.   Would be preferable to use an SSRI for REM suppression as Plan  B before Klonopin.  Marland Kitchen

## 2020-03-16 NOTE — Progress Notes (Signed)
SLEEP MEDICINE CLINIC   Provider:  Melvyn Novas, M D  Referring Provider: Etta Grandchild, MD Primary Care Physician:  Etta Grandchild, MD  Chief Complaint  Patient presents with  . Follow-up    pt alone, rm 11. presents today following up post Sleep study.     HPI:  Clarence Ware is a 69 y.o. male , seen in a Rv after sleep study on 03-16-2020.  He underwent a study on 25 January 2020 which showed no evidence of obstructive sleep apnea and there was no clinically relevant periodic limb movement especially not in REM sleep.  Normal EKG no drop in oxygen and treatment suggestions were made to stop with melatonin or an SSRI and to hold off on Klonopin as it is a potentially habit-forming medication.  However when he travels or may have for some reason unusual circumstances more stress sleep deprivation etc. it may well be worth having Klonopin as a rescue medication for that night.  The couple now sleeps in different rooms because of the REM enactment. He is still shouting.   He takes 3 mg melatonin, which may have reduced the physical activity.  He can use klonopin for rescue in certain situations, in light of his substance abuse history.    He continues to work as a Teacher, early years/pre . He was seen here as a referral from Dr. Yetta Barre for for a sleep consultation for the first time in 10-2015. Now he is here for a different concern , he lost weight, he was considered pre-diabetic, and his snoring is less. He is using a mouth piece made to measure by Dr. Myrtis Ser. No longer wakes up with headaches when wearing his mouth piece.     Chief complaint according to patient :" Recently the enactment of dreams has become a more prominent feature of his sleep, and of this year he had fallen out of bed dove out of bed as reported also broke nightstand lamp in the process.  REM sleep behavior was not a feature rehab explored when we first met over 4 years ago so it is a fairly recent event.  Clarence Ware has  respiratory allergies and of hyperlipidemia and hypertension, pre diabetes- all improved with weight loss.  he has no history of morbid obesity, and no history of shift work.  Sleep habits are as follows: Patient usually goes to bed around 9-9.30 PM and rises at 7 AM.  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  He usually falls asleep promptly after going to bed, he likes to read in bed, he sleeps on the side when he falls asleep but during the night resume supine position -he snores much less after weight loss and with mouth guard. . He sleeps on a flat bed, on 2 pillows. He does not watch TV in the bedroom, neither does his wife. He may have one bathroom break at night, rarely 2.  The dream enactment is around midnight or later-  This means he will get about 9 hours of sleep at night. He uses a phone- alarm to wake up in the morning at about 6 -7 AM, never uses the snooze button.  Many mornings he is just awake before the alarm rings. No longer has a  dry, parched mouth or sore throat.   Sleep medical history and family sleep history: The patient is unaware of any close family members that may have a sleep disorder or may be treated for sleep disorder. Mother had  Picks frontal lobe dementia.   Social history: [name] is married, lives with his wife, he no longer smokes and no longer takes pain medication.  2 grown children . No grandchildren.  Wife works part time.   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 He may drink another diet soda with caffeine during the day.  Again he no longer smokes or uses alcohol.   Review of Systems: Out of a complete 14 system review, the patient complains of only the following symptoms, and all other reviewed systems are negative. REM BD- yelling, screaming and has fallen out of bed, has bruised his  right shoulder . Rarely has he woken himself.   How likely are you to doze in the following situations: 0 = not likely, 1 = slight chance, 2 = moderate chance, 3 = high chance  Sitting and Reading? Watching Television? Sitting inactive in a public place (theater or meeting)? Lying down in the afternoon when circumstances permit? Sitting and talking to someone? Sitting quietly after lunch without alcohol? In a car, while stopped for a few minutes in traffic? As a passenger in a car for an hour without a break?  Total = 5    Epworth score 5 , Fatigue severity score 31 , depression score 2.   Social History   Socioeconomic History  . Marital status: Married    Spouse name: Clarence Ware  . Number of children: 2  . Years of education: Not on file  . Highest education level: Not on file  Occupational History  . 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 Smoking status: Former Smoker    Packs/day: 0.25    Years: 1.00    Pack years: 0.25    Quit date: 07/10/1992    Years since quitting: 27.7  . Smokeless tobacco: Never Used  Vaping Use  . Vaping Use: Never used  Substance and Sexual Activity  . Alcohol use: No    Alcohol/week: 0.0 standard drinks  . Drug use: Not Currently    Comment: sobrity for 10 years  . Sexual activity: Yes  Other Topics Concern  . Not on file  Social History Narrative   Married. Education: [employer].   Exercise: some   Caffeine- coffee 2 c daily   Social Determinants of Health   Financial Resource Strain:   . Difficulty of Paying Living Expenses: Not on file  Food Insecurity:   . Worried About Programme researcher, broadcasting/film/video in the Last Year: Not on file  . Ran Out of Food in the Last Year: Not on file  Transportation Needs:   . Lack of Transportation (Medical): Not on file  . Lack of Transportation (Non-Medical): Not on file  Physical Activity:   . Days of Exercise per Week: Not on file  . Minutes of Exercise per Session: Not on file  Stress:   .  Feeling of Stress : Not on file  Social Connections:   . Frequency of Communication with Friends and Family: Not on file  . Frequency of Social Gatherings with Friends and Family: Not on file  . Attends Religious Services: Not on file  . Active Member of Clubs or Organizations: Not on file  . Attends Banker Meetings: Not on file  . Marital Status: Not on file  Intimate Partner Violence:   . Fear of  Current or Ex-Partner: Not on file  . Emotionally Abused: Not on file  . Physically Abused: Not on file  . Sexually Abused: Not on file    Family History  Problem Relation Age of Onset  . Heart disease Mother   . Stroke Mother   . Heart disease Father 51       MI  . Hypertension Father   . Hypertension Brother   . Cancer Brother   . Diabetes Brother   . Heart disease Brother   . Hyperlipidemia Brother   . Cancer Brother   . Hyperlipidemia Sister   . Coronary artery disease Brother 3       CABG  . Colon cancer Paternal Grandmother     Past Medical History:  Diagnosis Date  . Allergy   . First degree heart block   . Hyperlipidemia   . Hypertension   . REM sleep behavior disorder   . Substance abuse [facility])     Past Surgical History:  Procedure Laterality Date  . INGUINAL HERNIA REPAIR Right 03/10/2015   Procedure: OPEN RIGHT INGUINAL HERNIA REPAIR;  Surgeon: Johnathan Hausen, MD;  Location: [facility];  Service: General;  Laterality: Right;  With MESH  . VASECTOMY      Current Outpatient Medications  Medication Sig Dispense Refill  . aspirin EC 81 MG tablet Take 81 mg by mouth daily.    Marland Kitchen atorvastatin (LIPITOR) 40 MG tablet TAKE ONE TABLET BY MOUTH DAILY 58 tablet 8  . cetirizine (ZYRTEC) 10 MG tablet Take 10 mg by mouth as directed.     . Cholecalciferol (VITAMIN D) 2000 units CAPS Take 1 capsule by mouth daily.    Marland Kitchen ezetimibe (ZETIA) 10 MG tablet TAKE ONE TABLET BY MOUTH DAILY 90 tablet 0  . losartan (COZAAR) 25 MG tablet TAKE ONE TABLET BY MOUTH DAILY 90  tablet 0  . melatonin 3 MG TABS tablet Take 2 tablets (6 mg total) by mouth at bedtime. (Patient taking differently: Take 3 mg by mouth at bedtime. ) 180 tablet 1  . Multiple Vitamin (MULTI-VITAMIN PO) Take 1 tablet by mouth.    . tadalafil (CIALIS) 20 MG tablet Use as directed. 6 tablet 11   No current facility-administered medications for this visit.    Allergies as of 03/16/2020 - Review Complete 03/16/2020  Allergen Reaction Noted  . Nexium [esomeprazole magnesium]  03/14/2013    Vitals: BP (!) 142/88   Pulse 69   Ht 6' 2.5" (1.892 m)   Wt 195 lb (88.5 kg)   BMI 24.70 kg/m  Last Weight:  Wt Readings from Last 1 Encounters:  03/16/20 195 lb (88.5 kg)   FXT:KWIO mass index is 24.7 kg/m.     Last Height:   Ht Readings from Last 1 Encounters:  03/16/20 6' 2.5" (1.892 m)    Physical exam:  General: The patient is awake, alert and appears not in acute distress.  The patient is well groomed. He is much slimmer than in 2017.   Head: Normocephalic, atraumatic. Neck is supple. Mallampati 3- tip of the uvula behind the tongue ground.  There is no longer evidence of a scalloped tongue/  neck circumference now 16. 25"  . Nasal airflow restricted ,  he had deviated septum surgery. TMJ not  evident . Retrognathia is not seen.  Full facial hair.  Cardiovascular:  Regular rate and rhythm, without  murmurs or carotid bruit, and without distended neck veins. Respiratory: Lungs are clear to auscultation. Skin:  Without  evidence of edema, or rash Trunk: BMI is 24 kg/ m2 from 28. The patient's posture is erect  Neurologic exam : The patient is awake and alert, oriented to place and time.   Memory subjective  described as intact.   Attention span & concentration ability appears normal.  Speech is fluent,  without dysarthria, dysphonia or aphasia.  Mood and affect are appropriate.  Cranial nerves: no loss of smell or taste.  Pupils are equal and briskly reactive to light. Funduscopic  exam deferred.  Extraocular movements  in vertical and horizontal planes intact and without nystagmus. Visual fields by finger perimetry are intact. Hearing to finger rub intact- wearing hearing aids in both ears. Facial sensation intact to fine touch.   Facial motor strength is symmetric and tongue and uvula move midline. Shoulder shrug was symmetrical.   Motor exam:  Normal tone, muscle bulk and symmetric strength in all extremities. Loose wrist, fully rleaxed biceps , no cogwheeling. Slightly more tone on his right shoulder , related to ROM Sensory:  Fine touch and vibration were tested in all extremities.  Proprioception tested in the upper extremities was normal. Coordination:  Reports no changes in penmanship. Rapid alternating movements in the fingers/hands intact. Finger-to-nose maneuver  normal without evidence of ataxia, dysmetria or tremor. Gait and station: Patient walks without assistive device and is able unassisted to climb up to the exam table. Strength within normal limits.  Stance is stable and normal.   Deep tendon reflexes: in the upper and lower extremities are symmetric and intact. Babinski maneuver deferred.   The patient was advised of the nature of the diagnosed sleep disorder , the treatment options and risks for general a health and wellness arising from not treating the condition.  I spent more than 35 minutes of face to face time with the patient. Greater than 50% of time was spent in counseling and coordination of care. We have discussed the diagnosis and differential and I answered the patient's questions.    Assessment:  After physical and neurologic examination, review of laboratory studies,  Personal review of imaging studies, reports of other /same  Imaging studies ,  Results of polysomnography/ neurophysiology testing and pre-existing records as far as provided in visit., my assessment is ;  After the infusion of contrast material, a normal enhancement pattern  is noted.   IMPRESSION:   1.  Couple punctate T2/FLAIR hyperintense foci in the subcortical or deep white matter of the hemispheres consistent with very minimal chronic microvascular ischemic change, typical for age. 2.  Mild ethmoid, sphenoid and right frontal chronic sinusitis. 3.  There were no acute findings and there was a normal enhancement pattern.     INTERPRETING PHYSICIAN:  Skylan A. Felecia Shelling, MD, PhD, FAAN Certified in  Neuroimaging by [employer]  1)  UARS - uses a dental device ( made to measure with Dr Ron Parker). The patient has gained control of his UARS, snoring and very mild apnea, AHI was 2.5/h in 5/ 2017 . AHI was now 0.5/h and RDI was not scored.   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 Usually stays in bed , thrashes and yells, but recently has fallen out of bed. Melatonin was initiated most recently 3 weeks ago, but he still yells and screams, has not fallen out of bed since.  Continues with melatonin, may use klonopin for extreme situations.     Plan:  Treatment plan and  additional workup :  Starting Klonopin only  if Melatonin fails to control -  he has a substance abuse history and would not like to start this medication, for obvious reasons.  Would be preferable to use an SSRI for REM suppression .    Asencion Partridge Akram Kissick MD  03/16/2020   CC: [name], Md [address] [place],  [place] [address]

## 2020-03-19 ENCOUNTER — Ambulatory Visit (INDEPENDENT_AMBULATORY_CARE_PROVIDER_SITE_OTHER): Payer: PPO

## 2020-03-19 DIAGNOSIS — Z0000 Encounter for general adult medical examination without abnormal findings: Secondary | ICD-10-CM | POA: Diagnosis not present

## 2020-03-19 NOTE — Progress Notes (Addendum)
I connected with [name] today by telephone and verified that I am speaking with the correct person using two identifiers. Location patient: home Location provider: work Persons participating in the virtual visit: [name] and [name], LPN.   I discussed the limitations, risks, security and privacy concerns of performing an evaluation and management service by telephone and the availability of in person appointments. I also discussed with the patient that there may be a patient responsible charge related to this service. The patient expressed understanding and verbally consented to this telephonic visit.    Interactive audio and video telecommunications were attempted between this provider and patient, however failed, due to patient having technical difficulties OR patient did not have access to video capability.  We continued and completed visit with audio only.  Some vital signs may be absent or patient reported.   Time Spent with patient on telephone encounter: 20 minutes  Subjective:   Clarence Ware is a 69 y.o. male who presents for Medicare Annual/Subsequent preventive examination.  Review of Systems    No ROS. Medicare Wellness Visit Cardiac Risk Factors include: advanced age (>62men, >31 women);dyslipidemia;family history of premature cardiovascular disease;hypertension;male gender     Objective:    Today's Vitals   03/19/20 0823  PainSc: 0-No pain   There is no height or weight on file to calculate BMI.  Advanced Directives 03/19/2020 02/23/2020 03/07/2019 12/27/2018 01/25/2018 01/19/2017 03/19/2015  Does Patient Have a Medical Advance Directive? Yes No Yes Yes Yes Yes Yes  Type of Advance Directive - - [place];Living will Healthcare Power of [place];Living will [place];Living will -  Does patient want to make changes to medical advance directive? No - Patient declined - - - - - -   Copy of [place] in Chart? - - No - copy requested - No - copy requested Yes -    Current Medications (verified) Outpatient Encounter Medications as of 03/19/2020  Medication Sig  . aspirin EC 81 MG tablet Take 81 mg by mouth daily.  Marland Kitchen atorvastatin (LIPITOR) 40 MG tablet TAKE ONE TABLET BY MOUTH DAILY  . cetirizine (ZYRTEC) 10 MG tablet Take 10 mg by mouth as directed.   . Cholecalciferol (VITAMIN D) 2000 units CAPS Take 1 capsule by mouth daily.  . clonazePAM (KLONOPIN) 0.5 MG tablet Take 1 tablet (0.5 mg total) by mouth at bedtime as needed for anxiety.  Marland Kitchen ezetimibe (ZETIA) 10 MG tablet TAKE ONE TABLET BY MOUTH DAILY  . losartan (COZAAR) 25 MG tablet TAKE ONE TABLET BY MOUTH DAILY  . melatonin 3 MG TABS tablet Take 2 tablets (6 mg total) by mouth at bedtime. (Patient taking differently: Take 3 mg by mouth at bedtime. )  . Multiple Vitamin (MULTI-VITAMIN PO) Take 1 tablet by mouth.  . tadalafil (CIALIS) 20 MG tablet Use as directed.  . [DISCONTINUED] losartan (COZAAR) 25 MG tablet TAKE ONE TABLET BY MOUTH DAILY   No facility-administered encounter medications on file as of 03/19/2020.    Allergies (verified) Nexium [esomeprazole magnesium]   History: Past Medical History:  Diagnosis Date  . Allergy   . First degree heart block   . Hyperlipidemia   . Hypertension   . REM sleep behavior disorder   . Substance abuse [facility])    Past Surgical History:  Procedure Laterality Date  . INGUINAL HERNIA REPAIR Right 03/10/2015   Procedure: OPEN RIGHT INGUINAL HERNIA REPAIR;  Surgeon: Johnathan Hausen, MD;  Location: [facility];  Service: General;  Laterality: Right;  With MESH  . VASECTOMY     Family History  Problem Relation Age of Onset  . Heart disease Mother   . Stroke Mother   . Heart disease Father 70       MI  . Hypertension Father   . Hypertension Brother   . Cancer Brother   . Diabetes Brother   . Heart disease Brother   . Hyperlipidemia Brother   . Cancer  Brother   . Hyperlipidemia Sister   . Coronary artery disease Brother 60       CABG  . Colon cancer Paternal Grandmother    Social History   Socioeconomic History  . Marital status: Married    Spouse name: Earlie Server  . Number of children: 2  . Years of education: Not on file  . Highest education level: Not on file  Occupational History  . Occupation: Software engineer part-time  Tobacco Use  . Smoking status: Former Smoker    Packs/day: 0.25    Years: 1.00    Pack years: 0.25    Quit date: 07/10/1992    Years since quitting: 27.7  . Smokeless tobacco: Never Used  Vaping Use  . Vaping Use: Never used  Substance and Sexual Activity  . Alcohol use: No    Alcohol/week: 0.0 standard drinks  . Drug use: Not Currently    Comment: sobrity for 10 years  . Sexual activity: Yes  Other Topics Concern  . Not on file  Social History Narrative   Married. Education: [employer].   Exercise: some   Caffeine- coffee 2 c daily   Social Determinants of Health   Financial Resource Strain: Low Risk   . Difficulty of Paying Living Expenses: Not hard at all  Food Insecurity: No Food Insecurity  . Worried About Charity fundraiser in the Last Year: Never true  . Ran Out of Food in the Last Year: Never true  Transportation Needs: No Transportation Needs  . Lack of Transportation (Medical): No  . Lack of Transportation (Non-Medical): No  Physical Activity: Sufficiently Active  . Days of Exercise per Week: 6 days  . Minutes of Exercise per Session: 30 min  Stress: No Stress Concern Present  . Feeling of Stress : Not at all  Social Connections:   . Frequency of Communication with Friends and Family: Not on file  . Frequency of Social Gatherings with Friends and Family: Not on file  . Attends Religious Services: Not on file  . Active Member of Clubs or Organizations: Not on file  . Attends Archivist Meetings: Not on file  . Marital Status: Not on file    Tobacco Counseling Counseling  given: Not Answered   Clinical Intake:  Pre-visit preparation completed: Yes  Pain : No/denies pain Pain Score: 0-No pain     Nutritional Risks: None Diabetes: No  How often do you need to have someone help you when you read instructions, pamphlets, or other written materials from your doctor or pharmacy?: 1 - Never What is the last grade level you completed in school?: College  Diabetic? no  Interpreter Needed?: No  Information entered by :: Makalya Nave N. Davonn Flanery, LPN   Activities of Daily Living In your present state of health, do you have any difficulty performing the following activities: 03/19/2020  Hearing? N  Vision? N  Difficulty concentrating or making decisions? N  Walking or climbing stairs? N  Dressing or bathing? N  Doing errands, shopping? N  Preparing Food and eating ? N  Using the Toilet? N  In the past six months, have you accidently leaked urine? N  Do you have problems with loss of bowel control? N  Managing your Medications? N  Managing your Finances? N  Housekeeping or managing your Housekeeping? N  Some recent data might be hidden    Patient Care Team: [name], MD as PCP - General (Internal Medicine) Troy Sine, MD as PCP - Cardiology (Cardiology) Franchot Gallo, MD as Consulting Physician (Urology) Katy Apo, MD as Consulting Physician (Ophthalmology) Irene Shipper, MD as Consulting Physician (Gastroenterology)  Indicate any recent Medical Services you may have received from other than [facility] providers in the past year (date may be approximate).     Assessment:   This is a routine wellness examination for [place].  Hearing/Vision screen No exam data present  Dietary issues and exercise activities discussed: Current Exercise Habits: Home exercise routine, Type of exercise: walking, Time (Minutes): 30, Frequency (Times/Week): 6, Weekly Exercise (Minutes/Week): 180, Intensity: Moderate, Exercise limited by: None  identified  Goals    . Patient Stated     Continue to eat healthy and more natural, increase my exercise to be routine, travel, enjoy life, family and continue to be active in the 12 step program.      Depression Screen PHQ 2/9 Scores 03/19/2020 07/24/2019 03/07/2019 07/22/2018 01/25/2018 01/19/2017 01/18/2017  PHQ - 2 Score 0 0 0 0 0 0 0  PHQ- 9 Score - - - - 0 - -    Fall Risk Fall Risk  03/19/2020 12/11/2019 07/24/2019 03/07/2019 07/22/2018  Falls in the past year? 1 1 1  0 0  Number falls in past yr: 0 0 1 0 0  Injury with Fall? 0 0 0 0 0  Risk for fall due to : No Fall Risks - Other (Comment) - -  Risk for fall due to: Comment - - Patient fell in holes in yard while doing yard work. - -  Follow up Falls evaluation completed - Falls evaluation completed - Falls evaluation completed    Any stairs in or around the home? No  If so, are there any without handrails? No  Home free of loose throw rugs in walkways, pet beds, electrical cords, etc? Yes  Adequate lighting in your home to reduce risk of falls? Yes   ASSISTIVE DEVICES UTILIZED TO PREVENT FALLS:  Life alert? No  Use of a cane, walker or w/c? No  Grab bars in the bathroom? Yes  Shower chair or bench in shower? Yes  Elevated toilet seat or a handicapped toilet? No   TIMED UP AND GO:  Was the test performed? No .  Length of time to ambulate 10 feet: 0 sec.   Gait steady and fast without use of assistive device  Cognitive Function: Patient is cogitatively intact.        Immunizations Immunization History  Administered Date(s) Administered  . Hepatitis B 06/09/2013  . Influenza, High Dose Seasonal PF 05/02/2019  . Influenza-Unspecified 04/09/2014, 04/27/2018, 05/02/2019  . Moderna SARS-COVID-2 Vaccination 07/08/2019, 08/05/2019  . Pneumococcal Conjugate-13 01/13/2016  . Pneumococcal Polysaccharide-23 01/18/2017  . Tdap 10/03/2011, 02/23/2020  . Zoster 06/09/2013  . Zoster Recombinat (Shingrix) 03/02/2018, 05/19/2018     TDAP status: Up to date Flu Vaccine status: Up to date Pneumococcal vaccine status: Up to date Covid-19 vaccine status: Completed vaccines  Qualifies for Shingles Vaccine? Yes   Zostavax completed Yes  Shingrix Completed?: Yes  Screening Tests Health Maintenance  Topic Date Due  . INFLUENZA VACCINE  10/07/2020 (Originally 02/08/2020)  . COLONOSCOPY  03/18/2025  . TETANUS/TDAP  02/22/2030  . COVID-19 Vaccine  Completed  . Hepatitis C Screening  Completed  . PNA vac Low Risk Adult  Completed    Health Maintenance  There are no preventive care reminders to display for this patient.  Colorectal cancer screening: Completed 03/19/2015. Repeat every 10 years  Lung Cancer Screening: (Low Dose CT Chest recommended if Age 55-80 years, 30 pack-year currently smoking OR have quit w/in 15years.) does not qualify.   Lung Cancer Screening Referral: no  Additional Screening:  Hepatitis C Screening: does not qualify; Completed: no  Vision Screening: Recommended annual ophthalmology exams for early detection of glaucoma and other disorders of the eye. Is the patient up to date with their annual eye exam?  Yes  Who is the provider or what is the name of the office in which the patient attends annual eye exams? Katy Apo, MD If pt is not established with a provider, would they like to be referred to a provider to establish care? No .   Dental Screening: Recommended annual dental exams for proper oral hygiene  Community Resource Referral / Chronic Care Management: CRR required this visit?  No   CCM required this visit?  No      Plan:     I have personally reviewed and noted the following in the patient's chart:   . Medical and social history . Use of alcohol, tobacco or illicit drugs  . Current medications and supplements . Functional ability and status . Nutritional status . Physical activity . Advanced directives . List of other physicians . Hospitalizations,  surgeries, and ER visits in previous 12 months . Vitals . Screenings to include cognitive, depression, and falls . Referrals and appointments  In addition, I have reviewed and discussed with patient certain preventive protocols, quality metrics, and best practice recommendations. A written personalized care plan for preventive services as well as general preventive health recommendations were provided to patient.     Sheral Flow, LPN   [id number]   Nurse Notes:  Patient is cogitatively intact. There were no vitals filed for this visit. There is no height or weight on file to calculate BMI. Patient stated that he has no issues with gait or balance; does not use any assistive devices.  Medical screening examination/treatment/procedure(s) were performed by non-physician practitioner and as supervising physician I was immediately available for consultation/collaboration. I agree with above. Lew Dawes, MD

## 2020-03-19 NOTE — Patient Instructions (Addendum)
[name] , Thank you for taking time to come for your Medicare Wellness Visit. I appreciate your ongoing commitment to your health goals. Please review the following plan we discussed and let me know if I can assist you in the future.   Screening recommendations/referrals: Colonoscopy: 03/19/2015; due every 10 years Recommended yearly ophthalmology/optometry visit for glaucoma screening and checkup Recommended yearly dental visit for hygiene and checkup  Vaccinations: Influenza vaccine: 05/02/2019 Pneumococcal vaccine: up to date Tdap vaccine: 02/23/2020; due every 10 years Shingles vaccine: up to date   Covid-19: up to date  Advanced directives: Please bring a copy of your health care power of attorney and living will to the office at your convenience.  Conditions/risks identified: Yes; Reviewed health maintenance screenings with patient today and relevant education, vaccines, and/or referrals were provided. Please continue to do your personal lifestyle choices by: daily care of teeth and gums, regular physical activity (goal should be 5 days a week for 30 minutes), eat a healthy diet, avoid tobacco and drug use, limiting any alcohol intake, taking a low-dose aspirin (if not allergic or have been advised by your provider otherwise) and taking vitamins and minerals as recommended by your provider. Continue doing brain stimulating activities (puzzles, reading, adult coloring books, staying active) to keep memory sharp. Continue to eat heart healthy diet (full of fruits, vegetables, whole grains, lean protein, water--limit salt, fat, and sugar intake) and increase physical activity as tolerated.  Next appointment: Please schedule your next Medicare Wellness Visit with your Nurse Health Advisor in 1 year.  Preventive Care 70 Years and Older, Male Preventive care refers to lifestyle choices and visits with your health care provider that can promote health and wellness. What does preventive care  include?  A yearly physical exam. This is also called an annual well check.  Dental exams once or twice a year.  Routine eye exams. Ask your health care provider how often you should have your eyes checked.  Personal lifestyle choices, including:  Daily care of your teeth and gums.  Regular physical activity.  Eating a healthy diet.  Avoiding tobacco and drug use.  Limiting alcohol use.  Practicing safe sex.  Taking low doses of aspirin every day.  Taking vitamin and mineral supplements as recommended by your health care provider. What happens during an annual well check? The services and screenings done by your health care provider during your annual well check will depend on your age, overall health, lifestyle risk factors, and family history of disease. Counseling  Your health care provider may ask you questions about your:  Alcohol use.  Tobacco use.  Drug use.  Emotional well-being.  Home and relationship well-being.  Sexual activity.  Eating habits.  History of falls.  Memory and ability to understand (cognition).  Work and work Statistician. Screening  You may have the following tests or measurements:  Height, weight, and BMI.  Blood pressure.  Lipid and cholesterol levels. These may be checked every 5 years, or more frequently if you are over 75 years old.  Skin check.  Lung cancer screening. You may have this screening every year starting at age 70 if you have a 30-pack-year history of smoking and currently smoke or have quit within the past 15 years.  Fecal occult blood test (FOBT) of the stool. You may have this test every year starting at age 70.  Flexible sigmoidoscopy or colonoscopy. You may have a sigmoidoscopy every 5 years or a colonoscopy every 10 years starting at  age 70.  Prostate cancer screening. Recommendations will vary depending on your family history and other risks.  Hepatitis C blood test.  Hepatitis B blood  test.  Sexually transmitted disease (STD) testing.  Diabetes screening. This is done by checking your blood sugar (glucose) after you have not eaten for a while (fasting). You may have this done every 1-3 years.  Abdominal aortic aneurysm (AAA) screening. You may need this if you are a current or former smoker.  Osteoporosis. You may be screened starting at age 43 if you are at high risk. Talk with your health care provider about your test results, treatment options, and if necessary, the need for more tests. Vaccines  Your health care provider may recommend certain vaccines, such as:  Influenza vaccine. This is recommended every year.  Tetanus, diphtheria, and acellular pertussis (Tdap, Td) vaccine. You may need a Td booster every 10 years.  Zoster vaccine. You may need this after age 70.  Pneumococcal 13-valent conjugate (PCV13) vaccine. One dose is recommended after age 70.  Pneumococcal polysaccharide (PPSV23) vaccine. One dose is recommended after age 70. Talk to your health care provider about which screenings and vaccines you need and how often you need them. This information is not intended to replace advice given to you by your health care provider. Make sure you discuss any questions you have with your health care provider. Document Released: 07/23/2015 Document Revised: 03/15/2016 Document Reviewed: 04/27/2015 Elsevier Interactive Patient Education  2017 [place] Prevention in the Home Falls can cause injuries. They can happen to people of all ages. There are many things you can do to make your home safe and to help prevent falls. What can I do on the outside of my home?  Regularly fix the edges of walkways and driveways and fix any cracks.  Remove anything that might make you trip as you walk through a door, such as a raised step or threshold.  Trim any bushes or trees on the path to your home.  Use bright outdoor lighting.  Clear any walking paths of  anything that might make someone trip, such as rocks or tools.  Regularly check to see if handrails are loose or broken. Make sure that both sides of any steps have handrails.  Any raised decks and porches should have guardrails on the edges.  Have any leaves, snow, or ice cleared regularly.  Use sand or salt on walking paths during winter.  Clean up any spills in your garage right away. This includes oil or grease spills. What can I do in the bathroom?  Use night lights.  Install grab bars by the toilet and in the tub and shower. Do not use towel bars as grab bars.  Use non-skid mats or decals in the tub or shower.  If you need to sit down in the shower, use a plastic, non-slip stool.  Keep the floor dry. Clean up any water that spills on the floor as soon as it happens.  Remove soap buildup in the tub or shower regularly.  Attach bath mats securely with double-sided non-slip rug tape.  Do not have throw rugs and other things on the floor that can make you trip. What can I do in the bedroom?  Use night lights.  Make sure that you have a light by your bed that is easy to reach.  Do not use any sheets or blankets that are too big for your bed. They should not hang down onto the  floor.  Have a firm chair that has side arms. You can use this for support while you get dressed.  Do not have throw rugs and other things on the floor that can make you trip. What can I do in the kitchen?  Clean up any spills right away.  Avoid walking on wet floors.  Keep items that you use a lot in easy-to-reach places.  If you need to reach something above you, use a strong step stool that has a grab bar.  Keep electrical cords out of the way.  Do not use floor polish or wax that makes floors slippery. If you must use wax, use non-skid floor wax.  Do not have throw rugs and other things on the floor that can make you trip. What can I do with my stairs?  Do not leave any items on the  stairs.  Make sure that there are handrails on both sides of the stairs and use them. Fix handrails that are broken or loose. Make sure that handrails are as long as the stairways.  Check any carpeting to make sure that it is firmly attached to the stairs. Fix any carpet that is loose or worn.  Avoid having throw rugs at the top or bottom of the stairs. If you do have throw rugs, attach them to the floor with carpet tape.  Make sure that you have a light switch at the top of the stairs and the bottom of the stairs. If you do not have them, ask someone to add them for you. What else can I do to help prevent falls?  Wear shoes that:  Do not have high heels.  Have rubber bottoms.  Are comfortable and fit you well.  Are closed at the toe. Do not wear sandals.  If you use a stepladder:  Make sure that it is fully opened. Do not climb a closed stepladder.  Make sure that both sides of the stepladder are locked into place.  Ask someone to hold it for you, if possible.  Clearly mark and make sure that you can see:  Any grab bars or handrails.  First and last steps.  Where the edge of each step is.  Use tools that help you move around (mobility aids) if they are needed. These include:  Canes.  Walkers.  Scooters.  Crutches.  Turn on the lights when you go into a dark area. Replace any light bulbs as soon as they burn out.  Set up your furniture so you have a clear path. Avoid moving your furniture around.  If any of your floors are uneven, fix them.  If there are any pets around you, be aware of where they are.  Review your medicines with your doctor. Some medicines can make you feel dizzy. This can increase your chance of falling. Ask your doctor what other things that you can do to help prevent falls. This information is not intended to replace advice given to you by your health care provider. Make sure you discuss any questions you have with your health care  provider. Document Released: 04/22/2009 Document Revised: 12/02/2015 Document Reviewed: 07/31/2014 Elsevier Interactive Patient Education  2017 [employer].

## 2020-03-24 DIAGNOSIS — H35373 Puckering of macula, bilateral: Secondary | ICD-10-CM | POA: Diagnosis not present

## 2020-03-24 DIAGNOSIS — H25043 Posterior subcapsular polar age-related cataract, bilateral: Secondary | ICD-10-CM | POA: Diagnosis not present

## 2020-03-24 DIAGNOSIS — H2513 Age-related nuclear cataract, bilateral: Secondary | ICD-10-CM | POA: Diagnosis not present

## 2020-03-24 DIAGNOSIS — H5211 Myopia, right eye: Secondary | ICD-10-CM | POA: Diagnosis not present

## 2020-04-26 DIAGNOSIS — Z9181 History of falling: Secondary | ICD-10-CM | POA: Diagnosis not present

## 2020-04-26 DIAGNOSIS — G4752 REM sleep behavior disorder: Secondary | ICD-10-CM | POA: Diagnosis not present

## 2020-04-26 DIAGNOSIS — R413 Other amnesia: Secondary | ICD-10-CM | POA: Diagnosis not present

## 2020-05-19 ENCOUNTER — Other Ambulatory Visit: Payer: Self-pay | Admitting: Internal Medicine

## 2020-05-19 ENCOUNTER — Other Ambulatory Visit: Payer: Self-pay | Admitting: Cardiovascular Disease

## 2020-05-19 DIAGNOSIS — I10 Essential (primary) hypertension: Secondary | ICD-10-CM

## 2020-06-08 DIAGNOSIS — Z20822 Contact with and (suspected) exposure to covid-19: Secondary | ICD-10-CM | POA: Diagnosis not present

## 2020-06-11 ENCOUNTER — Telehealth: Payer: Self-pay | Admitting: Internal Medicine

## 2020-06-11 DIAGNOSIS — I10 Essential (primary) hypertension: Secondary | ICD-10-CM

## 2020-06-11 DIAGNOSIS — E785 Hyperlipidemia, unspecified: Secondary | ICD-10-CM

## 2020-06-11 NOTE — Progress Notes (Signed)
°  Chronic Care Management   Note  06/11/2020 Name: [name] MRN: [id number] DOB: 1950-08-05  Anson Peddie is a 69 y.o. year old male who is a primary care patient of [name], MD. I reached out to [name] by phone today in response to a referral sent by Mr. Avraj Dohn's PCP, [name], MD.   Mr. Wenrick was given information about Chronic Care Management services today including:  1. CCM service includes personalized support from designated clinical staff supervised by his physician, including individualized plan of care and coordination with other care providers 2. 24/7 contact phone numbers for assistance for urgent and routine care needs. 3. Service will only be billed when office clinical staff spend 20 minutes or more in a month to coordinate care. 4. Only one practitioner may furnish and bill the service in a calendar month. 5. The patient may stop CCM services at any time (effective at the end of the month) by phone call to the office staff.   Patient agreed to services and verbal consent obtained.   Follow up plan:   Carley Perdue [employer] Scheduler

## 2020-06-16 ENCOUNTER — Ambulatory Visit: Payer: PPO | Admitting: Cardiovascular Disease

## 2020-07-16 ENCOUNTER — Telehealth: Payer: Self-pay | Admitting: Pharmacist

## 2020-07-16 NOTE — Progress Notes (Signed)
Chronic Care Management Pharmacy Assistant   Name: Azrael Maddix  MRN: [id number] DOB: 10-05-1950  Reason for Encounter: Initial Questions   PCP : [name], MD  Allergies:   Allergies  Allergen Reactions   Nexium [Esomeprazole Magnesium]     Awful regurgitation    Medications: Outpatient Encounter Medications as of 07/16/2020  Medication Sig   aspirin EC 81 MG tablet Take 81 mg by mouth daily.   atorvastatin (LIPITOR) 40 MG tablet TAKE ONE TABLET BY MOUTH DAILY   cetirizine (ZYRTEC) 10 MG tablet Take 10 mg by mouth as directed.    Cholecalciferol (VITAMIN D) 2000 units CAPS Take 1 capsule by mouth daily.   clonazePAM (KLONOPIN) 0.5 MG tablet Take 1 tablet (0.5 mg total) by mouth at bedtime as needed for anxiety.   ezetimibe (ZETIA) 10 MG tablet TAKE ONE TABLET BY MOUTH DAILY   losartan (COZAAR) 25 MG tablet TAKE ONE TABLET BY MOUTH DAILY   melatonin 3 MG TABS tablet Take 2 tablets (6 mg total) by mouth at bedtime. (Patient taking differently: Take 3 mg by mouth at bedtime. )   Multiple Vitamin (MULTI-VITAMIN PO) Take 1 tablet by mouth.   tadalafil (CIALIS) 20 MG tablet Use as directed.   No facility-administered encounter medications on file as of 07/16/2020.    Current Diagnosis: Patient Active Problem List   Diagnosis Date Noted   Chronic cluster headache, not intractable 03/16/2020   Uncontrolled REM sleep behavior disorder 03/16/2020   Wound infection, posttraumatic 03/02/2020   Benign prostatic hyperplasia without lower urinary tract symptoms 01/18/2017   Routine general medical examination at a health care facility 01/18/2017   Essential hypertension 12/18/2014   Hyperlipidemia with target LDL less than 100 12/18/2014   First degree heart block 03/30/2013   Prediabetes 06/30/2010   GERD 06/26/2008   ERECTILE DYSFUNCTION, MILD 01/30/2008   VENTRICULAR HYPERTROPHY, LEFT 11/29/2006   COLON POLYP 09/06/2006   ASTHMA, EXERCISE  INDUCED 09/06/2006    Goals Addressed   None     Follow-Up:  Pharmacist Review   Have you seen any other providers since your last visit? The patient states that he has seen a Film/video editor, Neurologist and a Urologist since his last visit with Dr. Ronnald Ramp.  Any changes in your medications or health? The patient states that he has not had any changes in his health or medications  Any side effects from any medications? The patient states that he does not have any side effects from medications  Do you have an symptoms or problems not managed by your medications? The patient states that he does not have any symptoms or problems that are not managed from medications  Any concerns about your health right now? The patient stated that he does not have any concerns about his health at this time  Has your provider asked that you check blood pressure, blood sugar, or follow special diet at home? The patient states that he has not been asked to check his blood pressure or sugar or follow a special diet  Do you get any type of exercise on a regular basis? The patient states that he walks and also does home exercise  Can you think of a goal you would like to reach for your health? The patient states that he would like to get his A1c below 5.7  Do you have any problems getting your medications? 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 The patient states he does not have anything specific to discuss about his health or medications at this time  Please bring medications and supplements to appointment    Wendy Poet, [place] [phone]

## 2020-07-19 ENCOUNTER — Other Ambulatory Visit: Payer: Self-pay

## 2020-07-19 ENCOUNTER — Ambulatory Visit: Payer: PPO | Admitting: Pharmacist

## 2020-07-19 ENCOUNTER — Telehealth: Payer: Self-pay | Admitting: Pharmacist

## 2020-07-19 DIAGNOSIS — E785 Hyperlipidemia, unspecified: Secondary | ICD-10-CM

## 2020-07-19 DIAGNOSIS — R7303 Prediabetes: Secondary | ICD-10-CM

## 2020-07-19 DIAGNOSIS — I10 Essential (primary) hypertension: Secondary | ICD-10-CM

## 2020-07-19 NOTE — Progress Notes (Signed)
° ° °  Chronic Care Management Pharmacy Assistant   Name: Clarence Ware  MRN: [id number] DOB: Jul 27, 1950  Reason for Encounter: On Boarding   PCP : [name], MD  Allergies:   Allergies  Allergen Reactions   Nexium [Esomeprazole Magnesium]     Awful regurgitation    Medications: Outpatient Encounter Medications as of 07/19/2020  Medication Sig   aspirin EC 81 MG tablet Take 81 mg by mouth daily.   atorvastatin (LIPITOR) 40 MG tablet TAKE ONE TABLET BY MOUTH DAILY   cetirizine (ZYRTEC) 10 MG tablet Take 10 mg by mouth as directed.    Cholecalciferol (VITAMIN D) 2000 units CAPS Take 1 capsule by mouth daily.   clonazePAM (KLONOPIN) 0.5 MG tablet Take 1 tablet (0.5 mg total) by mouth at bedtime as needed for anxiety.   ezetimibe (ZETIA) 10 MG tablet TAKE ONE TABLET BY MOUTH DAILY   losartan (COZAAR) 25 MG tablet TAKE ONE TABLET BY MOUTH DAILY   melatonin 3 MG TABS tablet Take 2 tablets (6 mg total) by mouth at bedtime. (Patient taking differently: Take 3 mg by mouth at bedtime.)   Multiple Vitamin (MULTI-VITAMIN PO) Take 1 tablet by mouth.   tadalafil (CIALIS) 20 MG tablet Use as directed.   No facility-administered encounter medications on file as of 07/19/2020.    Current Diagnosis: Patient Active Problem List   Diagnosis Date Noted   Chronic cluster headache, not intractable 03/16/2020   Uncontrolled REM sleep behavior disorder 03/16/2020   Wound infection, posttraumatic 03/02/2020   Benign prostatic hyperplasia without lower urinary tract symptoms 01/18/2017   Routine general medical examination at a health care facility 01/18/2017   Essential hypertension 12/18/2014   Hyperlipidemia with target LDL less than 100 12/18/2014   First degree heart block 03/30/2013   Prediabetes 06/30/2010   GERD 06/26/2008   ERECTILE DYSFUNCTION, MILD 01/30/2008   VENTRICULAR HYPERTROPHY, LEFT 11/29/2006   COLON POLYP 09/06/2006   ASTHMA, EXERCISE INDUCED  09/06/2006    Goals Addressed   None     Follow-Up:  Pharmacist Review   The patient had an initial televisit with the clinical pharmacist Charlene Brooke on 07/19/2020. Upon completion of the visit the patient has agreed to try [facility] services for their dispensing and delivery of medications. Per clinical pharmacist request I completed an on-boarding form with the list of the patients medications, current pharmacy, demographics, allergies and insurance information. The form was then forward to the clinical pharmacist for review,    Wendy Poet, [place] [phone]

## 2020-07-19 NOTE — Patient Instructions (Addendum)
Visit Information  Phone number for Pharmacist: [phone]  Thank you for meeting with me to discuss your medications! I look forward to working with you to achieve your health care goals. Below is a summary of what we talked about during the visit:  Goals Addressed            This Visit's Progress   . Pharmacy Care Plan       CARE PLAN ENTRY (see longitudinal plan of care for additional care plan information)  Current Barriers:  . Chronic Disease Management support, education, and care coordination needs related to Hypertension and Hyperlipidemia, Prediabetes   Hypertension BP Readings from Last 3 Encounters:  03/16/20 (!) 142/88  03/11/20 122/78  03/05/20 120/90 .  Pharmacist Clinical Goal(s): o Over the next 365 days, patient will work with PharmD and providers to maintain BP goal <130/80 . Current regimen:  o Losartan 25 mg daily . Interventions: o Discussed BP goals and benefits of medications for prevention of heart attack / stroke . Patient self care activities - Over the next 365 days, patient will: o Check BP as needed, document, and provide at future appointments o Ensure daily salt intake < 2300 mg/day  Hyperlipidemia Lab Results  Component Value Date/Time   LDLCALC 55 07/22/2019 08:18 AM   LDLCALC 89 10/24/2017 08:09 AM   LDLCALC 86 03/03/2013 08:55 AM .  Pharmacist Clinical Goal(s): o Over the next 365 days, patient will work with PharmD and providers to maintain LDL goal < 100 . Current regimen:  o Atorvastatin 40 mg daily o Ezetimibe 10 mg daily o Aspirin 81 mg daily . Interventions: o Discussed cholesterol goals and benefits of medications for prevention of heart attack / stroke . Patient self care activities - Over the next 365 days, patient will: o Continue current medications  Prediabetes Lab Results  Component Value Date/Time   HGBA1C 5.8 (H) 03/02/2020 08:56 AM   HGBA1C 6.2 07/22/2019 08:18 AM .  Pharmacist Clinical Goal(s): o Over the  next 365 days, patient will work with PharmD and providers to maintain A1c goal <6.5% . Current regimen:  o No medications . Interventions: o Discussed importance of maintaining sugars at goal to prevent complications of diabetes including kidney damage, retinal damage, and cardiovascular disease . Patient self care activities - Over the next 365 days, patient will: o Continue with diet control  Medication management . Pharmacist Clinical Goal(s): o Over the next 365 days, patient will work with PharmD and providers to achieve optimal medication adherence . Current pharmacy: Clarence Ware . Interventions o Comprehensive medication review performed. o Utilize [facility] for medication synchronization, packaging and delivery . Patient self care activities - Over the next 365 days, patient will: o Focus on medication adherence by fill date o Take medications as prescribed o Report any questions or concerns to PharmD and/or provider(s)  Initial goal documentation      Clarence Ware was given information about Chronic Care Management services today including:  1. CCM service includes personalized support from designated clinical staff supervised by his physician, including individualized plan of care and coordination with other care providers 2. 24/7 contact phone numbers for assistance for urgent and routine care needs. 3. Standard insurance, coinsurance, copays and deductibles apply for chronic care management only during months in which we provide at least 20 minutes of these services. Most insurances cover these services at 100%, however patients may be responsible for any copay, coinsurance and/or deductible if applicable. This service may help  you avoid the need for more expensive face-to-face services. 4. Only one practitioner may furnish and bill the service in a calendar month. 5. The patient may stop CCM services at any time (effective at the end of the month) by phone call to  the office staff.  Patient agreed to services and verbal consent obtained.   The patient verbalized understanding of instructions, educational materials, and care plan provided today and agreed to receive a mailed copy of patient instructions, educational materials, and care plan.  Telephone follow up appointment with pharmacy team member scheduled for: 2 months  Charlene Brooke, PharmD, BCACP Clinical Pharmacist [facility] Maintenance After Age 28 After age 6, you are at a higher risk for certain long-term diseases and infections as well as injuries from falls. Falls are a major cause of broken bones and head injuries in people who are older than age 28. Getting regular preventive care can help to keep you healthy and well. Preventive care includes getting regular testing and making lifestyle changes as recommended by your health care provider. Talk with your health care provider about:  Which screenings and tests you should have. A screening is a test that checks for a disease when you have no symptoms.  A diet and exercise plan that is right for you. What should I know about screenings and tests to prevent falls? Screening and testing are the best ways to find a health problem early. Early diagnosis and treatment give you the best chance of managing medical conditions that are common after age 22. Certain conditions and lifestyle choices may make you more likely to have a fall. Your health care provider may recommend:  Regular vision checks. Poor vision and conditions such as cataracts can make you more likely to have a fall. If you wear glasses, make sure to get your prescription updated if your vision changes.  Medicine review. Work with your health care provider to regularly review all of the medicines you are taking, including over-the-counter medicines. Ask your health care provider about any side effects that may make you more likely  to have a fall. Tell your health care provider if any medicines that you take make you feel dizzy or sleepy.  Osteoporosis screening. Osteoporosis is a condition that causes the bones to get weaker. This can make the bones weak and cause them to break more easily.  Blood pressure screening. Blood pressure changes and medicines to control blood pressure can make you feel dizzy.  Strength and balance checks. Your health care provider may recommend certain tests to check your strength and balance while standing, walking, or changing positions.  Foot health exam. Foot pain and numbness, as well as not wearing proper footwear, can make you more likely to have a fall.  Depression screening. You may be more likely to have a fall if you have a fear of falling, feel emotionally low, or feel unable to do activities that you used to do.  Alcohol use screening. Using too much alcohol can affect your balance and may make you more likely to have a fall. What actions can I take to lower my risk of falls? General instructions  Talk with your health care provider about your risks for falling. Tell your health care provider if: ? You fall. Be sure to tell your health care provider about all falls, even ones that seem minor. ? You feel dizzy, sleepy, or off-balance.  Take over-the-counter and prescription medicines only  as told by your health care provider. These include any supplements.  Eat a healthy diet and maintain a healthy weight. A healthy diet includes low-fat dairy products, low-fat (lean) meats, and fiber from whole grains, beans, and lots of fruits and vegetables. Home safety  Remove any tripping hazards, such as rugs, cords, and clutter.  Install safety equipment such as grab bars in bathrooms and safety rails on stairs.  Keep rooms and walkways well-lit. Activity  Follow a regular exercise program to stay fit. This will help you maintain your balance. Ask your health care provider what  types of exercise are appropriate for you.  If you need a cane or walker, use it as recommended by your health care provider.  Wear supportive shoes that have nonskid soles.   Lifestyle  Do not drink alcohol if your health care provider tells you not to drink.  If you drink alcohol, limit how much you have: ? 0-1 drink a day for women. ? 0-2 drinks a day for men.  Be aware of how much alcohol is in your drink. In the [place] one drink equals one typical bottle of beer (12 oz), one-half glass of wine (5 oz), or one shot of hard liquor (1 oz).  Do not use any products that contain nicotine or tobacco, such as cigarettes and e-cigarettes. If you need help quitting, ask your health care provider. Summary  Having a healthy lifestyle and getting preventive care can help to protect your health and wellness after age 40.  Screening and testing are the best way to find a health problem early and help you avoid having a fall. Early diagnosis and treatment give you the best chance for managing medical conditions that are more common for people who are older than age 50.  Falls are a major cause of broken bones and head injuries in people who are older than age 32. Take precautions to prevent a fall at home.  Work with your health care provider to learn what changes you can make to improve your health and wellness and to prevent falls. This information is not intended to replace advice given to you by your health care provider. Make sure you discuss any questions you have with your health care provider. Document Revised: 10/17/2018 Document Reviewed: 05/09/2017 Elsevier Patient Education  2021 [employer].

## 2020-07-19 NOTE — Chronic Care Management (AMB) (Signed)
Chronic Care Management Pharmacy  Name: Clarence Ware  MRN: [id number] DOB: 03-Jan-1951   Chief Complaint/ HPI  Clarence Ware,  70 y.o. , male presents for his Initial CCM visit with the clinical pharmacist via telephone due to COVID-19 Pandemic.  PCP : [name], MD Patient Care Team: [name], MD as PCP - General (Internal Medicine) Troy Sine, MD as PCP - Cardiology (Cardiology) Franchot Gallo, MD as Consulting Physician (Urology) Katy Apo, MD as Consulting Physician (Ophthalmology) Irene Shipper, MD as Consulting Physician (Gastroenterology) Charlton Haws, [facility] as Pharmacist (Pharmacist)  Patient's chronic conditions include: Hypertension, Hyperlipidemia, GERD, Asthma and BPH  Office Visits: 03/11/20 Dr Ronnald Ramp OV: f/u for wound infection, improving and tolerating antibiotics.   03/05/20 Dr Ronnald Ramp OV: wound infection, finished Augmentin, rx'd omadaycline for broad spectrum coverage.  Consult Visit: 04/26/20 Dr Conrad Goree [facility] geriatrics): evaluation for memory loss. Referred for neuropsych testing. MOCA 27/30.   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  Subjective: Patient lives at home with his wife. He is a Engineer, materials in long term care  Objective: Allergies  Allergen Reactions  . Nexium [Esomeprazole Magnesium]     Awful regurgitation    Medications: Outpatient Encounter Medications as of 07/19/2020  Medication Sig  . aspirin EC 81 MG tablet Take 81 mg by mouth daily.  Marland Kitchen atorvastatin (LIPITOR) 40 MG tablet TAKE ONE TABLET BY MOUTH DAILY  . cetirizine (ZYRTEC) 10 MG tablet Take 10 mg by mouth as directed.   . Cholecalciferol (VITAMIN D) 2000 units CAPS Take 1 capsule by mouth daily.  . clonazePAM (KLONOPIN) 0.5 MG tablet Take 1 tablet (0.5 mg total) by mouth at bedtime as needed for anxiety.  Marland Kitchen ezetimibe (ZETIA) 10 MG tablet TAKE ONE TABLET BY MOUTH DAILY  . losartan (COZAAR) 25  MG tablet TAKE ONE TABLET BY MOUTH DAILY  . melatonin 3 MG TABS tablet Take 2 tablets (6 mg total) by mouth at bedtime. (Patient taking differently: Take 3 mg by mouth at bedtime.)  . Multiple Vitamin (MULTI-VITAMIN PO) Take 1 tablet by mouth.  . tadalafil (CIALIS) 20 MG tablet Use as directed.   No facility-administered encounter medications on file as of 07/19/2020.    Wt Readings from Last 3 Encounters:  03/16/20 195 lb (88.5 kg)  03/11/20 193 lb (87.5 kg)  03/05/20 193 lb (87.5 kg)    Lab Results  Component Value Date   CREATININE 0.91 03/02/2020   BUN 20 03/02/2020   GFR 77.65 07/22/2019   GFRNONAA 86 03/02/2020   GFRAA 99 03/02/2020   NA 139 03/02/2020   K 4.4 03/02/2020   CALCIUM 9.2 03/02/2020   CO2 25 03/02/2020     Current Diagnosis/Assessment:    Goals Addressed            This Visit's Progress   . Pharmacy Care Plan       CARE PLAN ENTRY (see longitudinal plan of care for additional care plan information)  Current Barriers:  . Chronic Disease Management support, education, and care coordination needs related to Hypertension and Hyperlipidemia, Prediabetes   Hypertension BP Readings from Last 3 Encounters:  03/16/20 (!) 142/88  03/11/20 122/78  03/05/20 120/90 .  Pharmacist Clinical Goal(s): o Over the next 365 days, patient will work with PharmD and providers to maintain BP goal <130/80 . Current regimen:  o Losartan 25 mg daily . Interventions: o Discussed BP goals and benefits of medications for prevention  of heart attack / stroke . Patient self care activities - Over the next 365 days, patient will: o Check BP as needed, document, and provide at future appointments o Ensure daily salt intake < 2300 mg/day  Hyperlipidemia Lab Results  Component Value Date/Time   LDLCALC 55 07/22/2019 08:18 AM   LDLCALC 89 10/24/2017 08:09 AM   LDLCALC 86 03/03/2013 08:55 AM .  Pharmacist Clinical Goal(s): o Over the next 365 days, patient will work with  PharmD and providers to maintain LDL goal < 100 . Current regimen:  o Atorvastatin 40 mg daily o Ezetimibe 10 mg daily o Aspirin 81 mg daily . Interventions: o Discussed cholesterol goals and benefits of medications for prevention of heart attack / stroke . Patient self care activities - Over the next 365 days, patient will: o Continue current medications  Prediabetes Lab Results  Component Value Date/Time   HGBA1C 5.8 (H) 03/02/2020 08:56 AM   HGBA1C 6.2 07/22/2019 08:18 AM .  Pharmacist Clinical Goal(s): o Over the next 365 days, patient will work with PharmD and providers to maintain A1c goal <6.5% . Current regimen:  o No medications . Interventions: o Discussed importance of maintaining sugars at goal to prevent complications of diabetes including kidney damage, retinal damage, and cardiovascular disease . Patient self care activities - Over the next 365 days, patient will: o Continue with diet control  Medication management . Pharmacist Clinical Goal(s): o Over the next 365 days, patient will work with PharmD and providers to achieve optimal medication adherence . [facility] . Interventions o Comprehensive medication review performed. o Utilize [facility] for medication synchronization, packaging and delivery . Patient self care activities - Over the next 365 days, patient will: o Focus on medication adherence by fill date o Take medications as prescribed o Report any questions or concerns to PharmD and/or provider(s)  Initial goal documentation       Hypertension   BP goal is:  <130/80  Office blood pressures are  BP Readings from Last 3 Encounters:  03/16/20 (!) 142/88  03/11/20 122/78  03/05/20 120/90   Patient checks BP at home infrequently Patient home BP readings are ranging: n/a  Patient has failed these meds in the past: carvedilol Patient is currently controlled on the following medications:  . Losartan 25 mg  daily  We discussed: BP goals; pt denies issues/side effects  Plan  Continue current medications and control with diet and exercise   Hyperlipidemia   LDL goal < 100  Last lipids Lab Results  Component Value Date   CHOL 109 07/22/2019   HDL 41.70 07/22/2019   LDLCALC 55 07/22/2019   TRIG 60.0 07/22/2019   CHOLHDL 3 07/22/2019   Hepatic Function Latest Ref Rng & Units 07/22/2019 07/22/2018 10/24/2017  Total Protein 6.0 - 8.3 g/dL 7.0 6.8 6.8  Albumin 3.5 - 5.2 g/dL 4.3 4.3 4.5  AST 0 - 37 U/L $Remo'31 26 28  'lEgms$ ALT 0 - 53 U/L 34 34 28  Alk Phosphatase 39 - 117 U/L 84 77 91  Total Bilirubin 0.2 - 1.2 mg/dL 0.7 0.9 0.5  Bilirubin, Direct 0.0 - 0.3 mg/dL 0.2 - -    The ASCVD Risk score ([place]., et al., 2013) failed to calculate for the following reasons:   The valid total cholesterol range is 130 to 320 mg/dL   Patient has failed these meds in past: n/a Patient is currently controlled on the following medications:  . Atorvastatin 40 mg daily .  Ezetimibe 10 mg daily . Aspirin 81 mg daily  We discussed:  Cholesterol goals; benefits of statin for ASCVD risk reduction; pt has lost ~25 lbs since last year  Plan  Continue current medications and control with diet and exercise  Prediabetes   A1c goal <6.5%  Recent Relevant Labs: Lab Results  Component Value Date/Time   HGBA1C 5.8 (H) 03/02/2020 08:56 AM   HGBA1C 6.2 07/22/2019 08:18 AM   GFR 77.65 07/22/2019 08:18 AM   GFR 67.89 07/22/2018 08:59 AM    Last diabetic Eye exam: No results found for: HMDIABEYEEXA  Last diabetic Foot exam: No results found for: HMDIABFOOTEX   No medication indicated.  We discussed: diet and exercise extensively; A1c improved 6.2 > 5.8 after 20 lb wt loss in 2021  Plan  Continue control with diet and exercise  BPH / ED   Lab Results  Component Value Date/Time   PSA 1.88 07/22/2018 08:59 AM   PSA 1.81 01/18/2017 09:32 AM   Patient has failed these meds in past: n/a Patient is  currently controlled on the following medications:  . Cialis 20 mg as directed  We discussed:  Patient is satisfied with current regimen and denies issues  Plan  Continue current medications  Health Maintenance   Patient is currently controlled on the following medications:  . Cetirizine 10 mg PRN . Melatonin 3 mg PRN . Multivitamin . Vitamin D 2000 IU daily  We discussed:  Patient is satisfied with current regimen and denies issues   Plan  Continue current medications  Medication Management   [facility] Uses pill box? No - prefers bottles Pt endorses 100% compliance  We discussed: Verbal consent obtained for [facility] enhanced pharmacy services (medication synchronization, adherence packaging, delivery coordination). A medication sync plan was created to allow patient to get all medications delivered once every 30 to 90 days per patient preference. Patient understands they have freedom to choose pharmacy and clinical pharmacist will coordinate care between all prescribers and [facility].   Plan  Utilize [facility] for medication synchronization, packaging and delivery    Follow up: 12 month phone visit  Charlene Brooke, PharmD, [facility] Clinical Pharmacist [facility] [phone]

## 2020-07-26 ENCOUNTER — Ambulatory Visit: Payer: PPO | Admitting: Internal Medicine

## 2020-07-29 NOTE — Addendum Note (Signed)
Addended by: Karle Barr on: 07/29/2020 05:04 PM   Modules accepted: Orders

## 2020-08-03 ENCOUNTER — Ambulatory Visit: Payer: PPO | Admitting: Internal Medicine

## 2020-08-04 ENCOUNTER — Ambulatory Visit: Payer: PPO | Admitting: Cardiovascular Disease

## 2020-08-04 ENCOUNTER — Other Ambulatory Visit: Payer: Self-pay

## 2020-08-04 ENCOUNTER — Encounter: Payer: Self-pay | Admitting: Cardiovascular Disease

## 2020-08-04 DIAGNOSIS — R0683 Snoring: Secondary | ICD-10-CM

## 2020-08-04 DIAGNOSIS — I10 Essential (primary) hypertension: Secondary | ICD-10-CM

## 2020-08-04 DIAGNOSIS — G4752 REM sleep behavior disorder: Secondary | ICD-10-CM | POA: Diagnosis not present

## 2020-08-04 DIAGNOSIS — E785 Hyperlipidemia, unspecified: Secondary | ICD-10-CM | POA: Diagnosis not present

## 2020-08-04 DIAGNOSIS — I440 Atrioventricular block, first degree: Secondary | ICD-10-CM

## 2020-08-04 MED ORDER — LOSARTAN POTASSIUM 25 MG PO TABS: 12.5000 mg | ORAL_TABLET | Freq: Every day | ORAL | 0 refills | Status: DC

## 2020-08-04 NOTE — Progress Notes (Unsigned)
Patient ID: Clarence Ware, male   DOB: Jan 05, 1951, 70 y.o.   MRN: [id number]    Primary M.D.: Dr. Ivar Bury  HPI: Clarence Ware is a 70 y.o. male presents to the office today for a 7 month followup cardiology evaluation.  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  Recently, another brother has a chronic aortic dissection for which she's been treated medically.  Clarence Ware has a history of hyperlipidemia and hypertension.  Over 10 years ago Clarence Ware had a normal routine treadmill test. The patient has continued to exercise regularly. There is a prior history of substance abuse for which Clarence Ware did undergo treatment.  Clarence Ware has lost his pharmacy license but worked his way back and after 5 years.  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  Clarence Ware  has a history of hyperlipidemia, erectile dysfunction, a history of intermittent headaches.  When I saw him on 11/07/2012, we discussed the sensitivity and specificity of routine treadmill testing with a strong family history I elected to schedule him for a cardiopulmonary met test for further evaluation would also be helpful to assess endothelial dysfunction/diastolic dysfunction evaluate potential for microvascular angina. Clarence Ware also had a soft cardiac murmur at his apex and recommended a 2-D echo Doppler study.  His cardiopulmonary met test revealed that Clarence Ware had good functional capacity with an excellent maximum oxygen consumption peak of 92% of predicted. However, Clarence Ware had a abnormal response during the last several minutes of exercise suggesting ischemic myocardial dysfunction with decreasing stroke volume with increasing work rate which corresponded to an abrupt decrease in cardiac output suggestive of exercise-induced myocardial dysfunction. Clarence Ware did have very mild ventilation/perfusion mismatch  with reference to his pulmonary status.  A 2-D echo Doppler study showed normal systolic function with an ejection fraction of 55-60%. Clarence Ware had normal diastolic parameters.  When I saw him in followup of his noninvasive studies in June 2014 due to the mild abnormality noted during the last 7 minutes of exercise I recommended the addition of ARB therapy and further titrated his atorvastatin for aggressive lipid management. Clarence Ware has tolerated initiation of losartan.  I last saw him on most 2 years ago.  Clarence Ware denied any  chest pain or palpitations. Clarence Ware Clarence Ware did have a followup NMR lipoprotein of which showed an LDL particle number of 1216 with a calculated LDL of 86, HDL 46, triglycerides 58, total cholesterol 144. Clarence Ware had 552 small LDL some particles. HDL particle number was reduced at 29.1. Insulin resistance score was normal at 33.  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  However, Clarence Ware is still walking at least 3 days per week for a minimum of 30 minutes.  Clarence Ware underwent a sleep study at [facility] sleep at [facility] on 05/03/201 and was not found to have significant obstructive sleep apnea and his AHI and RDI were both 2.9.  Clarence Ware has significant periodic limb movements of sleep and had snoring.  Clarence Ware tells me Clarence Ware had a mouth piece customized by Dr. Oneal Grout for treatment of his snoring.   I saw him in January 2019.  Clarence Ware denies any episodes of chest pain or shortness of breath.  Clarence Ware is unaware of palpitations.  Clarence Ware uses his mouthguard  to sleep and denies any awareness of breakthrough snoring.  Clarence Ware continues to be on losartan 25 mg daily, and metoprolol succinate 25 mg daily for hypertension.  Clarence Ware has continued to be on atorvastatin 20 mg.  Of note, laboratory 6 months ago showed an LDL cholesterol, which had risen up to 90.  Diet evaluation I further titrated atorvastatin to 40 mg.  Over the last 6 months Clarence Ware has continued to  remain stable.  Clarence Ware underwent repeat laboratory on his increased atorvastatin which had not significantly changed.  Total cholesterol was 150, triglycerides 63, HDL 48, LDL 89.  Clarence Ware denies chest pain PND orthopnea.  Clarence Ware denies shortness of breath.  Clarence Ware has noticed some rare dizziness particularly when Clarence Ware bends over.    Clarence Ware was  evaluated in a telemedicine visit on October 11, 2018.  Prior to his evaluation in June 2019 Clarence Ware had self reduced his metoprolol succinate from 25 mg down to 12.5 mg due to noticing that his heart rate was morning somewhat slow.  Clarence Ware started to notice that particularly following exertion his blood pressure at times have gotten low and Clarence Ware has recorded blood pressure levels of 114/71 with a pulse of 67, 106/68 with a pulse of 65, 95/60 with a pulse of 63 and on 1 occasion systolic blood pressure had dropped to as low as 88.  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  Clarence Ware denied any episodes of chest pain.  Clarence Ware was taking losartan 25 mg daily in addition to Toprol-XL 12.5 mg daily and was on atorvastatin 40 mg and Zetia 10 mg for hyperlipidemia.  In January 2020 LDL cholesterol was 70.  Clarence Ware continues to take aspirin.  Clarence Ware admits to some fatigue associated with bradycardia.  Clarence Ware was using a customized oral appliance for his mild sleep apnea and followed by Dr. Oneal Grout for his snoring/sleep apnea.   I last saw him in June 2021 since his previous evaluation Clarence Ware was having significant dreaming and falling out of bed diving due to potential REM related sleep disorder.  Clarence Ware ultimately was referred to Dr. Asencion Partridge Dohmeier and apparently underwent an MRI  for assessment.  Clarence Ware is aware of the association of potential future Parkinson disease or Lewy body disorder associated with rem behavior parasomnias.  Clarence Ware does not know the  results of his MRI imaging.  Clarence Ware denies chest pain or palpitations.  Sometimes if Clarence Ware stands abruptly Clarence Ware may note mild dizzy spells but this has improved with discontinuance of atenolol and switching to carvedilol.  Since I saw him, Clarence Ware has remained fairly stable.  Clarence Ware is trying to slow down and is now working approximately 7 days/month.  Clarence Ware has not had any further significant night terrors Clarence Ware is on clonazepam in addition to melatonin 6 mg at bedtime.  20 pounds over the past year.  Clarence Ware has continued to be on losartan 25 mg.  Clarence Ware is on atorvastatin 40 mg and Zetia 10 mg for hyperlipidemia.  Clarence Ware sees Dr. [name] who checks his laboratory.  Clarence Ware presents for evaluation.   Past Medical History:  Diagnosis Date  . Allergy   . First degree heart block   . Hyperlipidemia   . Hypertension   . REM sleep behavior disorder   . Substance abuse ([place])  Past Surgical History:  Procedure Laterality Date  . INGUINAL HERNIA REPAIR Right 03/10/2015   Procedure: OPEN RIGHT INGUINAL HERNIA REPAIR;  Surgeon: Luretha Murphy, MD;  Location: [facility];  Service: General;  Laterality: Right;  With MESH  . VASECTOMY      Allergies  Allergen Reactions  . Nexium [Esomeprazole Magnesium]     Awful regurgitation    Current Outpatient Medications  Medication Sig Dispense Refill  . aspirin EC 81 MG tablet Take 81 mg by mouth daily.    Marland Kitchen atorvastatin (LIPITOR) 40 MG tablet TAKE ONE TABLET BY MOUTH DAILY 58 tablet 8  . cetirizine (ZYRTEC) 10 MG tablet Take 10 mg by mouth as directed. PRN    . Cholecalciferol (VITAMIN D) 2000 units CAPS Take 1 capsule by mouth daily.    Marland Kitchen ezetimibe (ZETIA) 10 MG tablet TAKE ONE TABLET BY MOUTH DAILY 90 tablet 0  . melatonin 3 MG TABS tablet Take 2 tablets (6 mg total) by mouth at bedtime. (Patient taking differently: Take 3 mg by mouth at bedtime.) 180 tablet 1  . Multiple Vitamin (MULTI-VITAMIN PO) Take 1 tablet by mouth.    . clonazePAM (KLONOPIN) 0.5 MG tablet Take 1 tablet (0.5 mg  total) by mouth at bedtime as needed for anxiety. 5 tablet 0  . losartan-hydrochlorothiazide (HYZAAR) 100-12.5 MG tablet Take 1 tablet by mouth daily.     No current facility-administered medications for this visit.    Socially Clarence Ware is married has 2 children no grandchildren. Clarence Ware does exercise at least 30 minutes at a time. There is a prior history of tobacco use but Clarence Ware quit over 3 years ago and a prior use of opiates. Clarence Ware re-obtained his pharmacist license. 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  ROS General: Negative; No fevers, chills, or night sweats;  HEENT: Negative; No changes in vision or hearing, sinus congestion, difficulty swallowing Pulmonary: Negative; No cough, wheezing, shortness of breath, hemoptysis Cardiovascular: See history of present illness GI: Negative; No nausea, vomiting, diarrhea, or abdominal pain GU: Negative; No dysuria, hematuria, or difficulty voiding Musculoskeletal: Negative; no myalgias, joint pain, or weakness Hematologic/Oncology: Negative; no easy bruising, bleeding Endocrine: Negative; no heat/cold intolerance; no diabetes Neuro: Negative; no changes in balance, headaches Skin: Negative; No rashes or skin lesions Psychiatric: Negative; No behavioral problems, depression Sleep: Sleep study negative for OSA but suspicious for increased upper airway resistance.  Clarence Ware did have periodic limb movements that were frequent.  No hypnogognic hallucinations, no cataplexy Other comprehensive 14 point system review is negative.  PE BP 104/62 (BP Location: Left Arm, Patient Position: Sitting)   Pulse 63   Ht 6\' 2"  (1.88 m)   Wt 197 lb (89.4 kg)   SpO2 99%   BMI 25.29 kg/m    Blood pressure by me was 108/64  Wt Readings from Last 3 Encounters:  08/05/20 197 lb (89.4 kg)  08/04/20 197 lb (89.4 kg)  03/16/20 195 lb (88.5 kg)   General: Alert, oriented, no distress.  Skin: normal turgor, no rashes, warm and dry HEENT:  Normocephalic, atraumatic. Pupils equal round and reactive to light; sclera anicteric; extraocular muscles intact;  Nose without nasal septal hypertrophy Mouth/Parynx benign; Mallinpatti scale 3 Neck: No JVD, no carotid bruits; normal carotid upstroke Lungs: clear to ausculatation and percussion; no wheezing or rales Chest wall: without tenderness to palpitation Heart: PMI not displaced, RRR, s1 s2 normal, 1/6 systolic murmur, no diastolic murmur, no rubs, gallops, thrills,  or heaves Abdomen: soft, nontender; no hepatosplenomehaly, BS+; abdominal aorta nontender and not dilated by palpation. Back: no CVA tenderness Pulses 2+ Musculoskeletal: full range of motion, normal strength, no joint deformities Extremities: no clubbing cyanosis or edema, Homan's sign negative  Neurologic: grossly nonfocal; Cranial nerves grossly wnl Psychologic: Normal mood and affect    ECG (independently read by me): Sinus rhythm at 63, 1st degree AV bloick: PR 236  June 2021 ECG (independently read by me): Sinus rhythm at 64; 1st degree AV block, PR 232    msec; no ectopy  November 2020 ECG (independently read by me): Sinus bradycardia 55 bpm, first-degree AV block with a PR interval 238 ms.  QTc interval 380 ms.  No ectopy.  June 2019 ECG (independently read by me): Normal sinus rhythm at 62 bpm.  First-degree AV block with a PR interval at 240 ms.  No ectopy.  Normal intervals.   July 18, 2017 ECG (independently read by me): Normal sinus rhythm with an isolated PVC.  PR interval 202 ms.  Heart rate 63 bpm.  October 2017 ECG (independently read by me): Sinus bradycardia 57 bpm.  First-degree AV block with a PR interval at 228 ms.  No ST segment changes.  June 2016 ECG (independently read by me): Sinus rhythm with first-degree AV block.  PR interval 264 ms.  QTc interval normal 381 ms.  September 2014 ECG: Sinus bradycardia for the first degree AV block. PR interval  220 ms. QT interval 393 ms.  LABS: BMP  Latest Ref Rng & Units 03/02/2020 07/22/2019 07/22/2018  Glucose 65 - 99 mg/dL 98 107(H) 105(H)  BUN 7 - 25 mg/dL $Remove'20 16 20  'nrVXjWY$ Creatinine 0.70 - 1.25 mg/dL 0.91 0.96 1.14  BUN/Creat Ratio 6 - 22 (calc) NOT APPLICABLE - -  Sodium 973 - 146 mmol/L 139 139 138  Potassium 3.5 - 5.3 mmol/L 4.4 4.1 4.7  Chloride 98 - 110 mmol/L 104 103 103  CO2 20 - 32 mmol/L $RemoveB'25 29 30  'GydrDnFr$ Calcium 8.6 - 10.3 mg/dL 9.2 9.9 10.1   Hepatic Function Latest Ref Rng & Units 07/22/2019 07/22/2018 10/24/2017  Total Protein 6.0 - 8.3 g/dL 7.0 6.8 6.8  Albumin 3.5 - 5.2 g/dL 4.3 4.3 4.5  AST 0 - 37 U/L $Remo'31 26 28  'RKSCS$ ALT 0 - 53 U/L 34 34 28  Alk Phosphatase 39 - 117 U/L 84 77 91  Total Bilirubin 0.2 - 1.2 mg/dL 0.7 0.9 0.5  Bilirubin, Direct 0.0 - 0.3 mg/dL 0.2 - -   CBC Latest Ref Rng & Units 07/22/2019 07/22/2018 10/24/2017  WBC 4.0 - 10.5 K/uL 5.3 6.3 6.3  Hemoglobin 13.0 - 17.0 g/dL 14.5 14.7 15.3  Hematocrit 39.0 - 52.0 % 43.0 43.3 44.5  Platelets 150.0 - 400.0 K/uL 194.0 209.0 204   Lab Results  Component Value Date   MCV 90.7 07/22/2019   MCV 90.0 07/22/2018   MCV 90 10/24/2017   Lab Results  Component Value Date   TSH 1.77 07/22/2019   Lab Results  Component Value Date   HGBA1C 5.8 (H) 03/02/2020     Lipid Panel     Component Value Date/Time   CHOL 109 07/22/2019 0818   CHOL 150 10/24/2017 0809   CHOL 144 03/03/2013 0855   TRIG 60.0 07/22/2019 0818   TRIG 58 03/03/2013 0855   HDL 41.70 07/22/2019 0818   HDL 48 10/24/2017 0809   HDL 46 03/03/2013 0855   CHOLHDL 3 07/22/2019 0818   VLDL 12.0 07/22/2019  0818   LDLCALC 55 07/22/2019 0818   LDLCALC 89 10/24/2017 0809   LDLCALC 86 03/03/2013 0855     RADIOLOGY: No results found.  IMPRESSION:  1. Essential hypertension   2. First degree AV block   3. Hyperlipidemia with target LDL less than 70   4. REM sleep behavior disorder   5. 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  Clarence Ware has had first-degree block.  In the past, Clarence Ware has had some episodes of mild dizziness particularly if Clarence Ware bends over which has resulted in reduction in his beta-blocker regimen.  Remotely, his atenolol dose was switched to carvedilol low-dose which has been beneficial.  On subsequent evaluations, Clarence Ware continued to experience the dizziness and when last seen, it was recommended that Clarence Ware ultimately wound and discontinue carvedilol.  I last saw him, Clarence Ware has had purposeful weight loss of approximately 20 pounds.  His blood pressure today is low his current dose of losartan 25 mg.  I have suggested reducing this to 12.5 mg.  Clarence Ware will monitor his blood pressure.  If his blood pressure continues to be low this will be discontinued altogether.  Clarence Ware has been evaluated for possible rem sleep behavior disorder with night terrors and underwent an MRI which reportedly was stable.  His events have been improved with clonazepam in addition to melatonin.  Clarence Ware is aware of potential future development of Parkinson's disease or Lewy body disorder which has been associated with rem sleep behavior disorder.  Clarence Ware continues to use a customized oral appliance for sleep apnea.  Clarence Ware is tolerating atorvastatin 40 mg and Zetia 10 mg.  LDL cholesterol was 55 in January 2021.  Clarence Ware will be seeing Dr. [name] later this week and repeat laboratory will be obtained.  As long as Clarence Ware is stable, I will see him in 1 year for reevaluation or sooner as needed.   Troy Sine, MD, [facility]  08/05/2020 10:26 AM

## 2020-08-04 NOTE — Patient Instructions (Signed)
Medication Instructions:  DECREASE LOSARTAN 12.5MG  DAILY *If you need a refill on your cardiac medications before your next appointment, please call your pharmacy*  Lab Work:   Testing/Procedures:  NONE    NONE  Follow-Up: Your next appointment:  12 month(s) In Person with You may see Shelva Majestic, MD or one of the following Advanced Practice Providers on your designated Care Team:  Almyra Deforest, PA-C Fabian Sharp, PA-C or Roby Lofts, [place]  *Please call our office 2 months in advance to schedule this appointment   At [facility], you and your health needs are our priority.  As part of our continuing mission to provide you with exceptional heart care, we have created designated Provider Care Teams.  These Care Teams include your primary Cardiologist (physician) and Advanced Practice Providers (APPs -  Physician Assistants and Nurse Practitioners) who all work together to provide you with the care you need, when you need it.

## 2020-08-05 ENCOUNTER — Encounter: Payer: Self-pay | Admitting: Internal Medicine

## 2020-08-05 ENCOUNTER — Ambulatory Visit (INDEPENDENT_AMBULATORY_CARE_PROVIDER_SITE_OTHER): Payer: PPO | Admitting: Internal Medicine

## 2020-08-05 ENCOUNTER — Encounter: Payer: Self-pay | Admitting: Cardiovascular Disease

## 2020-08-05 VITALS — BP 128/84 | HR 66 | Temp 98.2°F | Ht 74.0 in | Wt 197.0 lb

## 2020-08-05 DIAGNOSIS — I10 Essential (primary) hypertension: Secondary | ICD-10-CM

## 2020-08-05 DIAGNOSIS — N400 Enlarged prostate without lower urinary tract symptoms: Secondary | ICD-10-CM

## 2020-08-05 DIAGNOSIS — R7303 Prediabetes: Secondary | ICD-10-CM | POA: Diagnosis not present

## 2020-08-05 DIAGNOSIS — E785 Hyperlipidemia, unspecified: Secondary | ICD-10-CM

## 2020-08-05 DIAGNOSIS — R4181 Age-related cognitive decline: Secondary | ICD-10-CM | POA: Diagnosis not present

## 2020-08-05 LAB — BASIC METABOLIC PANEL
BUN: 20 mg/dL (ref 6–23)
CO2: 32 mEq/L (ref 19–32)
Calcium: 9.9 mg/dL (ref 8.4–10.5)
Chloride: 105 mEq/L (ref 96–112)
Creatinine, Ser: 1.02 mg/dL (ref 0.40–1.50)
GFR: 74.68 mL/min (ref >60.00)
Glucose, Bld: 90 mg/dL (ref 70–99)
Potassium: 4.4 mEq/L (ref 3.5–5.1)
Sodium: 140 mEq/L (ref 135–145)

## 2020-08-05 LAB — CBC WITH DIFFERENTIAL/PLATELET
Basophils Absolute: 0 10*3/uL (ref 0.0–0.1)
Basophils Relative: 0.6 % (ref 0.0–3.0)
Eosinophils Absolute: 0.3 10*3/uL (ref 0.0–0.7)
Eosinophils Relative: 6.7 % — ABNORMAL HIGH (ref 0.0–5.0)
HCT: 41.2 % (ref 39.0–52.0)
Hemoglobin: 14.1 g/dL (ref 13.0–17.0)
Lymphocytes Relative: 23.9 % (ref 12.0–46.0)
Lymphs Abs: 1.2 10*3/uL (ref 0.7–4.0)
MCHC: 34.2 g/dL (ref 30.0–36.0)
MCV: 89.6 fl (ref 78.0–100.0)
Monocytes Absolute: 0.6 10*3/uL (ref 0.1–1.0)
Monocytes Relative: 12.2 % — ABNORMAL HIGH (ref 3.0–12.0)
Neutro Abs: 2.9 10*3/uL (ref 1.4–7.7)
Neutrophils Relative %: 56.6 % (ref 43.0–77.0)
Platelets: 175 10*3/uL (ref 150.0–400.0)
RBC: 4.6 Mil/uL (ref 4.22–5.81)
RDW: 13.4 % (ref 11.5–15.5)
WBC: 5.1 10*3/uL (ref 4.0–10.5)

## 2020-08-05 LAB — PSA: PSA: 2.03 ng/mL (ref 0.10–4.00)

## 2020-08-05 LAB — LIPID PANEL
Cholesterol: 118 mg/dL (ref 0–200)
HDL: 48.5 mg/dL (ref >39.00)
LDL Cholesterol: 59 mg/dL (ref 0–99)
NonHDL: 69.34
Total CHOL/HDL Ratio: 2
Triglycerides: 50 mg/dL (ref 0.0–149.0)
VLDL: 10 mg/dL (ref 0.0–40.0)

## 2020-08-05 LAB — FOLATE: Folate: >23.6 ng/mL (ref >5.9)

## 2020-08-05 LAB — VITAMIN B12: Vitamin B-12: 683 pg/mL (ref 211–911)

## 2020-08-05 LAB — VITAMIN D 25 HYDROXY (VIT D DEFICIENCY, FRACTURES): VITD: 67.26 ng/mL (ref 30.00–100.00)

## 2020-08-05 LAB — HEMOGLOBIN A1C: Hgb A1c MFr Bld: 6.2 % (ref 4.6–6.5)

## 2020-08-05 NOTE — Progress Notes (Signed)
Subjective:  Patient ID: Clarence Ware, male    DOB: 1951-06-27  Age: 70 y.o. MRN: [id number]  CC: Hypertension  This visit occurred during the SARS-CoV-2 public health emergency.  Safety protocols were in place, including screening questions prior to the visit, additional usage of staff PPE, and extensive cleaning of exam room while observing appropriate contact time as indicated for disinfecting solutions.    HPI Vlad Mayberry presents for f/up - He recently saw a geriatric specialist and it was recommended that he have some lab work completed for evaluation of age-related cognitive decline. He has had a few episodes of lightheadedness recently saw his cardiologist decreased his dose of the ARB.  Outpatient Medications Prior to Visit  Medication Sig Dispense Refill  . aspirin EC 81 MG tablet Take 81 mg by mouth daily.    Marland Kitchen atorvastatin (LIPITOR) 40 MG tablet TAKE ONE TABLET BY MOUTH DAILY 58 tablet 8  . cetirizine (ZYRTEC) 10 MG tablet Take 10 mg by mouth as directed. PRN    . Cholecalciferol (VITAMIN D) 2000 units CAPS Take 1 capsule by mouth daily.    . clonazePAM (KLONOPIN) 0.5 MG tablet Take 1 tablet (0.5 mg total) by mouth at bedtime as needed for anxiety. 5 tablet 0  . ezetimibe (ZETIA) 10 MG tablet TAKE ONE TABLET BY MOUTH DAILY 90 tablet 0  . losartan-hydrochlorothiazide (HYZAAR) 100-12.5 MG tablet Take 1 tablet by mouth daily.    . melatonin 3 MG TABS tablet Take 2 tablets (6 mg total) by mouth at bedtime. (Patient taking differently: Take 3 mg by mouth at bedtime.) 180 tablet 1  . Multiple Vitamin (MULTI-VITAMIN PO) Take 1 tablet by mouth.    . losartan (COZAAR) 25 MG tablet Take 0.5 tablets (12.5 mg total) by mouth daily. 90 tablet 0  . tadalafil (CIALIS) 20 MG tablet Use as directed. 6 tablet 11   No facility-administered medications prior to visit.    ROS Review of Systems  Constitutional: Negative for chills, diaphoresis, fatigue and fever.  HENT: Negative.    Eyes: Negative.   Respiratory: Negative for cough, chest tightness, shortness of breath and wheezing.   Cardiovascular: Negative for chest pain, palpitations and leg swelling.  Gastrointestinal: Negative for abdominal pain, constipation, diarrhea, nausea and vomiting.  Genitourinary: Negative for difficulty urinating.  Musculoskeletal: Negative for arthralgias and myalgias.  Skin: Negative.  Negative for color change and pallor.  Neurological: Positive for light-headedness. Negative for weakness.  Hematological: Negative for adenopathy. Does not bruise/bleed easily.  Psychiatric/Behavioral: Negative.     Objective:  BP 128/84   Pulse 66   Temp 98.2 F (36.8 C) (Oral)   Ht 6\' 2"  (1.88 m)   Wt 197 lb (89.4 kg)   SpO2 99%   BMI 25.29 kg/m   BP Readings from Last 3 Encounters:  08/05/20 128/84  08/04/20 104/62  03/16/20 (!) 142/88    Wt Readings from Last 3 Encounters:  08/05/20 197 lb (89.4 kg)  08/04/20 197 lb (89.4 kg)  03/16/20 195 lb (88.5 kg)    Physical Exam Vitals reviewed.  Constitutional:      Appearance: Normal appearance.  HENT:     Nose: Nose normal.     Mouth/Throat:     Mouth: Mucous membranes are moist.  Eyes:     General: No scleral icterus.    Conjunctiva/sclera: Conjunctivae normal.  Cardiovascular:     Rate and Rhythm: Normal rate and regular rhythm.     Heart sounds: No murmur heard.  Pulmonary:     Effort: Pulmonary effort is normal.     Breath sounds: No stridor. No wheezing, rhonchi or rales.  Abdominal:     General: Abdomen is flat. Bowel sounds are normal. There is no distension.     Palpations: Abdomen is soft. There is no hepatomegaly, splenomegaly or mass.     Tenderness: There is no abdominal tenderness.  Musculoskeletal:        General: Normal range of motion.     Cervical back: Neck supple.     Right lower leg: No edema.     Left lower leg: No edema.  Lymphadenopathy:     Cervical: No cervical adenopathy.  Skin:     General: Skin is warm and dry.     Coloration: Skin is not pale.  Neurological:     General: No focal deficit present.     Mental Status: He is alert.  Psychiatric:        Mood and Affect: Mood normal.        Behavior: Behavior normal.     Lab Results  Component Value Date   WBC 5.1 08/05/2020   HGB 14.1 08/05/2020   HCT 41.2 08/05/2020   PLT 175.0 08/05/2020   GLUCOSE 90 08/05/2020   CHOL 118 08/05/2020   TRIG 50.0 08/05/2020   HDL 48.50 08/05/2020   LDLCALC 59 08/05/2020   ALT 34 07/22/2019   AST 31 07/22/2019   NA 140 08/05/2020   K 4.4 08/05/2020   CL 105 08/05/2020   CREATININE 1.02 08/05/2020   BUN 20 08/05/2020   CO2 32 08/05/2020   TSH 1.77 07/22/2019   PSA 2.03 08/05/2020   HGBA1C 6.2 08/05/2020    No results found.  Assessment & Plan:   Beckett was seen today for hypertension.  Diagnoses and all orders for this visit:  Age-related cognitive decline- His labs are negative for secondary causes. -     VITAMIN D 25 Hydroxy (Vit-D Deficiency, Fractures); Future -     Vitamin B1; Future -     Folate; Future -     Vitamin B12; Future -     RPR; Future -     RPR -     Vitamin B12 -     Folate -     Vitamin B1 -     VITAMIN D 25 Hydroxy (Vit-D Deficiency, Fractures)  Essential hypertension- His blood pressure is adequately well controlled. -     VITAMIN D 25 Hydroxy (Vit-D Deficiency, Fractures); Future -     Basic metabolic panel; Future -     CBC with Differential/Platelet; Future -     CBC with Differential/Platelet -     Basic metabolic panel -     VITAMIN D 25 Hydroxy (Vit-D Deficiency, Fractures)  Benign prostatic hyperplasia without lower urinary tract symptoms- His PSA is not rising. This is a reassuring sign that he doesn't have prostate cancer. -     PSA; Future -     PSA  Hyperlipidemia with target LDL less than 100- He has achieved his LDL goal and is doing well on the statin. -     Lipid panel; Future -     Lipid panel  Prediabetes-  His A1c is at 6.2%. Medical therapy is not indicated. -     Hemoglobin A1c; Future -     Hemoglobin A1c   I have discontinued Clarence Ware "[name]"'s tadalafil and losartan. I am also having him maintain his cetirizine, Vitamin  D, aspirin EC, Multiple Vitamin (MULTI-VITAMIN PO), atorvastatin, melatonin, clonazePAM, ezetimibe, and losartan-hydrochlorothiazide.  No orders of the defined types were placed in this encounter.    Follow-up: Return in about 6 months (around 02/02/2021).  [name], MD

## 2020-08-05 NOTE — Patient Instructions (Signed)

## 2020-08-09 LAB — VITAMIN B1: Vitamin B1 (Thiamine): 19 nmol/L (ref 8–30)

## 2020-08-09 LAB — RPR: RPR Ser Ql: NONREACTIVE

## 2020-08-20 ENCOUNTER — Telehealth: Payer: Self-pay | Admitting: Cardiovascular Disease

## 2020-08-20 MED ORDER — EZETIMIBE 10 MG PO TABS: 10.0000 mg | ORAL_TABLET | Freq: Every day | ORAL | 0 refills | Status: DC

## 2020-08-20 NOTE — Telephone Encounter (Signed)
°*  STAT* If patient is at the pharmacy, call can be transferred to refill team.   1. Which medications need to be refilled? (please list name of each medication and dose if known)  ezetimibe (ZETIA) 10 MG tablet [[id number]]    2. Which pharmacy/location (including street and city if local pharmacy) is medication to be sent to [employer] on file   3. Do they need a 30 day or 90 day supply? [place]

## 2020-08-20 NOTE — Addendum Note (Signed)
Addended by: Hinton Dyer on: 08/20/2020 02:58 PM   Modules accepted: Orders

## 2020-09-06 ENCOUNTER — Telehealth: Payer: Self-pay | Admitting: Pharmacist

## 2020-09-06 NOTE — Progress Notes (Signed)
    Chronic Care Management Pharmacy Assistant   Name: Clarence Ware  MRN: [id number] DOB: 10-06-1950  Reason for Encounter: Chart Review   PCP : [name], MD  Allergies:   Allergies  Allergen Reactions  . Nexium [Esomeprazole Magnesium]     Awful regurgitation    Medications: Outpatient Encounter Medications as of 09/06/2020  Medication Sig  . aspirin EC 81 MG tablet Take 81 mg by mouth daily.  Marland Kitchen atorvastatin (LIPITOR) 40 MG tablet TAKE ONE TABLET BY MOUTH DAILY  . cetirizine (ZYRTEC) 10 MG tablet Take 10 mg by mouth as directed. PRN  . Cholecalciferol (VITAMIN D) 2000 units CAPS Take 1 capsule by mouth daily.  . clonazePAM (KLONOPIN) 0.5 MG tablet Take 1 tablet (0.5 mg total) by mouth at bedtime as needed for anxiety.  Marland Kitchen ezetimibe (ZETIA) 10 MG tablet Take 1 tablet (10 mg total) by mouth daily.  Marland Kitchen losartan-hydrochlorothiazide (HYZAAR) 100-12.5 MG tablet Take 1 tablet by mouth daily.  . melatonin 3 MG TABS tablet Take 2 tablets (6 mg total) by mouth at bedtime. (Patient taking differently: Take 3 mg by mouth at bedtime.)  . Multiple Vitamin (MULTI-VITAMIN PO) Take 1 tablet by mouth.   No facility-administered encounter medications on file as of 09/06/2020.    Current Diagnosis: Patient Active Problem List   Diagnosis Date Noted  . Age-related cognitive decline 08/05/2020  . Chronic cluster headache, not intractable 03/16/2020  . Uncontrolled REM sleep behavior disorder 03/16/2020  . Benign prostatic hyperplasia without lower urinary tract symptoms 01/18/2017  . Routine general medical examination at a health care facility 01/18/2017  . Essential hypertension 12/18/2014  . Hyperlipidemia with target LDL less than 100 12/18/2014  . First degree heart block 03/30/2013  . Prediabetes 06/30/2010  . GERD 06/26/2008  . ERECTILE DYSFUNCTION, MILD 01/30/2008  . VENTRICULAR HYPERTROPHY, LEFT 11/29/2006  . COLON POLYP 09/06/2006  . ASTHMA, EXERCISE INDUCED 09/06/2006     Goals Addressed   None     Follow-Up:  Pharmacist Review   Reviewed chart for medication changes and adherence.  Patient last seen Dr. Claiborne Billings on 08/04/20 and Dr, Ronnald Ramp on 08/05/20, since his last care coordination call with clinical pharmacist and medications were made.   No gaps in adherence identified. Patient has follow up scheduled with pharmacy team. No further action required.   Wendy Poet, [place] [phone]

## 2020-09-29 ENCOUNTER — Telehealth: Payer: Self-pay | Admitting: Cardiovascular Disease

## 2020-09-29 ENCOUNTER — Telehealth: Payer: Self-pay | Admitting: Pharmacist

## 2020-09-29 DIAGNOSIS — E785 Hyperlipidemia, unspecified: Secondary | ICD-10-CM

## 2020-09-29 MED ORDER — EZETIMIBE 10 MG PO TABS: 10.0000 mg | ORAL_TABLET | Freq: Every day | ORAL | 0 refills | Status: DC

## 2020-09-29 MED ORDER — ATORVASTATIN CALCIUM 40 MG PO TABS: 40.0000 mg | ORAL_TABLET | Freq: Every day | ORAL | 3 refills | Status: DC

## 2020-09-29 NOTE — Telephone Encounter (Signed)
*  STAT* If patient is at the pharmacy, call can be transferred to refill team.   1. Which medications need to be refilled? (please list name of each medication and dose if known)   atorvastatin (LIPITOR) 40 MG tablet [[id number]]  ezetimibe (ZETIA) 10 MG tablet [[id number]]    2. Which pharmacy/location (including street and city if local pharmacy) is medication to be sent to?  [facility]    3. Do they need a 30 day or 90 day supply? [place]

## 2020-09-29 NOTE — Progress Notes (Addendum)
Error

## 2020-10-05 ENCOUNTER — Telehealth: Payer: Self-pay | Admitting: Pharmacist

## 2020-10-05 NOTE — Progress Notes (Signed)
    Chronic Care Management Pharmacy Assistant   Name: Clarence Ware  MRN: [id number] DOB: [birth year]   Reason for Encounter: Medication Coordination Call    Recent office visits:  None ID  Recent consult visits:  None ID  Hospital visits:  None in previous 6 months  Medications: Outpatient Encounter Medications as of 10/05/2020  Medication Sig  . aspirin EC 81 MG tablet Take 81 mg by mouth daily.  Marland Kitchen atorvastatin (LIPITOR) 40 MG tablet Take 1 tablet (40 mg total) by mouth daily.  . cetirizine (ZYRTEC) 10 MG tablet Take 10 mg by mouth as directed. PRN  . Cholecalciferol (VITAMIN D) 2000 units CAPS Take 1 capsule by mouth daily.  . clonazePAM (KLONOPIN) 0.5 MG tablet Take 1 tablet (0.5 mg total) by mouth at bedtime as needed for anxiety.  Marland Kitchen ezetimibe (ZETIA) 10 MG tablet Take 1 tablet (10 mg total) by mouth daily.  Marland Kitchen losartan-hydrochlorothiazide (HYZAAR) 100-12.5 MG tablet Take 1 tablet by mouth daily.  . melatonin 3 MG TABS tablet Take 2 tablets (6 mg total) by mouth at bedtime. (Patient taking differently: Take 3 mg by mouth at bedtime.)  . Multiple Vitamin (MULTI-VITAMIN PO) Take 1 tablet by mouth.   No facility-administered encounter medications on file as of 10/05/2020.   Reviewed chart for medication changes ahead of medication coordination call.  No OVs, Consults, or hospital visits since last care coordination call/Pharmacist visit. (If appropriate, list visit date, provider name)  No medication changes indicated OR if recent visit, treatment plan here.  BP Readings from Last 3 Encounters:  08/05/20 128/84  08/04/20 104/62  03/16/20 (!) 142/88    Lab Results  Component Value Date   HGBA1C 6.2 08/05/2020     Patient obtains medications through Vials  30 Days   Last adherence delivery included: Ezetimibe 10 mg Tab Take one tab once daily   Patient is due for next adherence delivery on: 10/11/20 Called patient and reviewed medications and coordinated  delivery.  This delivery to include: Ezetimibe 10 mg Tab Take one tab once daily Atorvastatin 40mg  Take one tab once daily     Patient declined the following medications  losartan patient has enough on hand .  Confirmed delivery date of 10/11/20, advised patient that pharmacy will contact them the morning of delivery.  Wendy Poet, [place] [phone]

## 2020-10-06 NOTE — Addendum Note (Signed)
Addended by: Charlton Haws on: 10/06/2020 12:29 PM   Modules accepted: Orders

## 2020-10-29 ENCOUNTER — Telehealth: Payer: Self-pay | Admitting: Pharmacist

## 2020-10-29 NOTE — Addendum Note (Signed)
Addended by: Charlton Haws on: 10/29/2020 04:20 PM   Modules accepted: Orders

## 2020-10-29 NOTE — Progress Notes (Signed)
Chronic Care Management Pharmacy Assistant   Name: Clarence Ware  MRN: [id number] DOB: September 15, 1950   Reason for Encounter: Hypertension Disease State Call   Conditions to be addressed/monitored: HTN   Recent office visits:  None ID  Recent consult visits:  None ID  Hospital visits:  None in previous 6 months  Medications: Outpatient Encounter Medications as of 10/29/2020  Medication Sig  . aspirin EC 81 MG tablet Take 81 mg by mouth daily.  Marland Kitchen atorvastatin (LIPITOR) 40 MG tablet Take 1 tablet (40 mg total) by mouth daily.  . cetirizine (ZYRTEC) 10 MG tablet Take 10 mg by mouth as directed. PRN  . Cholecalciferol (VITAMIN D) 2000 units CAPS Take 1 capsule by mouth daily.  . clonazePAM (KLONOPIN) 0.5 MG tablet Take 1 tablet (0.5 mg total) by mouth at bedtime as needed for anxiety.  Marland Kitchen ezetimibe (ZETIA) 10 MG tablet Take 1 tablet (10 mg total) by mouth daily.  Marland Kitchen losartan (COZAAR) 25 MG tablet Take 12.5 mg by mouth daily.  . melatonin 3 MG TABS tablet Take 2 tablets (6 mg total) by mouth at bedtime. (Patient taking differently: Take 3 mg by mouth at bedtime.)  . Multiple Vitamin (MULTI-VITAMIN PO) Take 1 tablet by mouth.   No facility-administered encounter medications on file as of 10/29/2020.    Reviewed chart prior to disease state call. Spoke with patient regarding BP  Recent Office Vitals: BP Readings from Last 3 Encounters:  08/05/20 128/84  08/04/20 104/62  03/16/20 (!) 142/88   Pulse Readings from Last 3 Encounters:  08/05/20 66  08/04/20 63  03/16/20 69    Wt Readings from Last 3 Encounters:  08/05/20 197 lb (89.4 kg)  08/04/20 197 lb (89.4 kg)  03/16/20 195 lb (88.5 kg)     Kidney Function Lab Results  Component Value Date/Time   CREATININE 1.02 08/05/2020 10:38 AM   CREATININE 0.91 03/02/2020 08:56 AM   CREATININE 0.96 07/22/2019 08:18 AM   CREATININE 1.06 01/13/2016 10:05 AM   GFR 74.68 08/05/2020 10:38 AM   GFRNONAA 86 03/02/2020 08:56 AM    GFRAA 99 03/02/2020 08:56 AM    BMP Latest Ref Rng & Units 08/05/2020 03/02/2020 07/22/2019  Glucose 70 - 99 mg/dL 90 98 107(H)  BUN 6 - 23 mg/dL 20 20 16   Creatinine 0.40 - 1.50 mg/dL 1.02 0.91 0.96  BUN/Creat Ratio 6 - 22 (calc) - NOT APPLICABLE -  Sodium 093 - 145 mEq/L 140 139 139  Potassium 3.5 - 5.1 mEq/L 4.4 4.4 4.1  Chloride 96 - 112 mEq/L 105 104 103  CO2 19 - 32 mEq/L 32 25 29  Calcium 8.4 - 10.5 mg/dL 9.9 9.2 9.9    . Current antihypertensive regimen: Patient states that he is not taking anything right now for blood pressure, states that his cardiologist said that he could come off  . How often are you checking your Blood Pressure? Patient states that he is taking his blood pressure  . Current home BP readings: Patient states that his blood pressure ranges between 120s,130s/75-83  . What recent interventions/DTPs have been made by any provider to improve Blood Pressure control since last CPP Visit: Patient has stopped losartan  . Any recent hospitalizations or ED visits since last visit with CPP? None ID  . What diet changes have been made to improve Blood Pressure Control?  Patient states that he has not made any changes to diet  . What exercise is being done to improve your Blood  Pressure Control?  Patient states that he has not made any changes to exercises  Adherence Review: Is the patient currently on ACE/ARB medication? No Does the patient have >5 day gap between last estimated fill dates? No  Star Rating Drugs: Atorvastatin 09/28/20 90 ds  [place] Pharmacist Assistant [phone]  Time spent:20

## 2020-11-10 DIAGNOSIS — R3915 Urgency of urination: Secondary | ICD-10-CM | POA: Diagnosis not present

## 2020-11-10 DIAGNOSIS — N400 Enlarged prostate without lower urinary tract symptoms: Secondary | ICD-10-CM | POA: Diagnosis not present

## 2020-11-10 DIAGNOSIS — R350 Frequency of micturition: Secondary | ICD-10-CM | POA: Diagnosis not present

## 2020-11-10 DIAGNOSIS — N5201 Erectile dysfunction due to arterial insufficiency: Secondary | ICD-10-CM | POA: Diagnosis not present

## 2020-11-10 LAB — PSA: PSA: 1.7

## 2020-12-08 ENCOUNTER — Other Ambulatory Visit: Payer: Self-pay | Admitting: Internal Medicine

## 2020-12-08 ENCOUNTER — Other Ambulatory Visit (HOSPITAL_COMMUNITY): Payer: Self-pay

## 2020-12-08 DIAGNOSIS — U071 COVID-19: Secondary | ICD-10-CM

## 2020-12-08 DIAGNOSIS — J208 Acute bronchitis due to other specified organisms: Secondary | ICD-10-CM

## 2020-12-08 MED ORDER — PAXLOVID 20 X 150 MG & 10 X 100MG PO TBPK
3.0000 | ORAL_TABLET | Freq: Two times a day (BID) | ORAL | 0 refills | Status: AC
  Filled 2020-12-08 (×2): qty 30, 5d supply, fill #0

## 2020-12-13 ENCOUNTER — Telehealth: Payer: Self-pay | Admitting: Pharmacist

## 2020-12-13 NOTE — Progress Notes (Signed)
    Chronic Care Management Pharmacy Assistant   Name: Clarence Ware  MRN: [id number] DOB: 1951/05/08   Reason for Encounter: Disease State General Call    Recent office visits:  None ID  Recent consult visits:  None ID  Hospital visits:  None in previous 6 months  Medications: Outpatient Encounter Medications as of 12/13/2020  Medication Sig  . aspirin EC 81 MG tablet Take 81 mg by mouth daily.  Marland Kitchen atorvastatin (LIPITOR) 40 MG tablet Take 1 tablet (40 mg total) by mouth daily.  . cetirizine (ZYRTEC) 10 MG tablet Take 10 mg by mouth as directed. PRN  . Cholecalciferol (VITAMIN D) 2000 units CAPS Take 1 capsule by mouth daily.  . clonazePAM (KLONOPIN) 0.5 MG tablet Take 1 tablet (0.5 mg total) by mouth at bedtime as needed for anxiety.  Marland Kitchen ezetimibe (ZETIA) 10 MG tablet Take 1 tablet (10 mg total) by mouth daily.  . melatonin 3 MG TABS tablet Take 2 tablets (6 mg total) by mouth at bedtime. (Patient taking differently: Take 3 mg by mouth at bedtime.)  . Multiple Vitamin (MULTI-VITAMIN PO) Take 1 tablet by mouth.  Wilfrid Lund & Ritonavir (PAXLOVID) 20 x 150 MG & 10 x 100MG  TBPK Take 3 tablets by mouth in the morning and at bedtime for 5 days.   No facility-administered encounter medications on file as of 12/13/2020.   Pharmacist Review  Have you had any problems recently with your health? Patient states that he does not have any new health issues at this time  Have you had any problems with your pharmacy? Patient states that he does not have any problems with getting medications or the cost of medications from the pharmacy  What issues or side effects are you having with your medications? Patient states that he is  having some side effects from paxlovid but not to the point he can't take the medication  What would you like me to pass along to Joslin Potts,[facility] for them to help you with?  Patient states that he is doing well and does not have any concerns about his health or  medications at this time  What can we do to take care of you better? Patient states that he has had his covid vaccines and booster shots. He would like them to be updated in his chart   Star Rating Drugs: Atorvastatin 11/08/20 90 d  [place] Pharmacist Assistant [phone]  Time spent:20

## 2020-12-29 ENCOUNTER — Telehealth: Payer: Self-pay | Admitting: Pharmacist

## 2020-12-30 NOTE — Progress Notes (Signed)
    Chronic Care Management Pharmacy Assistant   Name: Clarence Ware  MRN: [id number] DOB: 02/06/1951   Reason for Encounter: Medication Review    Recent office visits:  None ID  Recent consult visits:  None ID  Hospital visits:  None in previous 6 months  Medications: Outpatient Encounter Medications as of 12/29/2020  Medication Sig   aspirin EC 81 MG tablet Take 81 mg by mouth daily.   atorvastatin (LIPITOR) 40 MG tablet Take 1 tablet (40 mg total) by mouth daily.   cetirizine (ZYRTEC) 10 MG tablet Take 10 mg by mouth as directed. PRN   Cholecalciferol (VITAMIN D) 2000 units CAPS Take 1 capsule by mouth daily.   clonazePAM (KLONOPIN) 0.5 MG tablet Take 1 tablet (0.5 mg total) by mouth at bedtime as needed for anxiety.   ezetimibe (ZETIA) 10 MG tablet Take 1 tablet (10 mg total) by mouth daily.   melatonin 3 MG TABS tablet Take 2 tablets (6 mg total) by mouth at bedtime. (Patient taking differently: Take 3 mg by mouth at bedtime.)   Multiple Vitamin (MULTI-VITAMIN PO) Take 1 tablet by mouth.   No facility-administered encounter medications on file as of 12/29/2020.    Pharmacist Review  Reviewed chart for medication changes ahead of medication coordination call.  No OVs, Consults, or hospital visits since last care coordination call/Pharmacist visit. (If appropriate, list visit date, provider name)  No medication changes indicated OR if recent visit, treatment plan here.  BP Readings from Last 3 Encounters:  08/05/20 128/84  08/04/20 104/62  03/16/20 (!) 142/88    Lab Results  Component Value Date   HGBA1C 6.2 08/05/2020     Patient obtains medications through Vials  90 ds   Last adherence delivery included:  Ezetimibe 10 mg Tab Take one tab once daily Atorvastatin 40mg  Take one tab once daily     Patient is due for next adherence delivery on: 01/06/21. Called patient and reviewed medications and coordinated delivery.  This delivery to include: Patient  does not want a delivery    Patient declined the following medications atorvastatin and ezetimibe because he has a 30 ds left on each  Patient needs refills for None ID.  Confirmed delivery date of 01/06/21, advised patient that pharmacy will contact them the morning of delivery.   Star Rating Drugs: Atorvastatin 11/08/20 90 ds  Ethelene Hal Clinical Pharmacist Assistant [phone]   Time spent:24

## 2021-01-27 ENCOUNTER — Other Ambulatory Visit: Payer: Self-pay | Admitting: Cardiovascular Disease

## 2021-01-27 ENCOUNTER — Telehealth: Payer: Self-pay | Admitting: Pharmacist

## 2021-01-27 NOTE — Chronic Care Management (AMB) (Signed)
    Chronic Care Management Pharmacy Assistant   Name: Clarence Ware  MRN: [id number] DOB: 1950/11/29   Reason for Encounter: Medication Adherence Review  Hospital visits:  None in previous 6 months  Medications: Outpatient Encounter Medications as of 01/27/2021  Medication Sig   aspirin EC 81 MG tablet Take 81 mg by mouth daily.   atorvastatin (LIPITOR) 40 MG tablet Take 1 tablet (40 mg total) by mouth daily.   cetirizine (ZYRTEC) 10 MG tablet Take 10 mg by mouth as directed. PRN   Cholecalciferol (VITAMIN D) 2000 units CAPS Take 1 capsule by mouth daily.   clonazePAM (KLONOPIN) 0.5 MG tablet Take 1 tablet (0.5 mg total) by mouth at bedtime as needed for anxiety.   ezetimibe (ZETIA) 10 MG tablet TAKE ONE TABLET BY MOUTH ONCE DAILY   melatonin 3 MG TABS tablet Take 2 tablets (6 mg total) by mouth at bedtime. (Patient taking differently: Take 3 mg by mouth at bedtime.)   Multiple Vitamin (MULTI-VITAMIN PO) Take 1 tablet by mouth.   No facility-administered encounter medications on file as of 01/27/2021.   Reviewed chart for medication changes ahead of medication coordination call.  No OVs, Consults, or hospital visits since last care coordination call/Pharmacist visit. (If appropriate, list visit date, provider name)  No medication changes indicated OR if recent visit, treatment plan here.  BP Readings from Last 3 Encounters:  08/05/20 128/84  08/04/20 104/62  03/16/20 (!) 142/88    Lab Results  Component Value Date   HGBA1C 6.2 08/05/2020     Patient obtains medications through Adherence Packaging  90 Days   Last adherence delivery included: Patient did not want a delivery    Patient is due for next adherence delivery on: 02/09/21. Called patient and reviewed medications and coordinated delivery.  This delivery to include: Ezetimibe 10 mg Tab Take one tab once daily   Patient needs refills for  ezetimibe (ZETIA) 10 MG tablet  Confirmed delivery date of 02/09/21  advised patient that pharmacy will contact them the morning of delivery.   Last Adherence Delivery : Patient did not want a delivery   Medications Aspirin EC 81 MG tablet atorvastatin (LIPITOR) 40 MG tablet  - last filled 11/08/20 90 days cetirizine (ZYRTEC) 10 MG tablet Cholecalciferol (VITAMIN D) 2000 units CAPS clonazePAM (KLONOPIN) 0.5 MG tablet - last filled 03/16/20 5 dys  ezetimibe (ZETIA) 10 MG tablet - last filled 11/08/20 90 days  melatonin 3 MG TABS tablet Multiple Vitamin (MULTI-VITAMIN PO)   Star Rating Drugs: atorvastatin (LIPITOR) 40 MG tablet  - last filled 11/08/20 90 days  [place] Pharmacist Assistant [phone]

## 2021-02-01 ENCOUNTER — Ambulatory Visit: Payer: PPO | Admitting: Cardiovascular Disease

## 2021-02-11 ENCOUNTER — Telehealth: Payer: Self-pay | Admitting: Pharmacist

## 2021-02-11 NOTE — Progress Notes (Signed)
    Chronic Care Management Pharmacy Assistant   Name: Clarence Ware  MRN: [id number] DOB: 1950-09-18  Reason for Encounter: Medication Review  Medications: Outpatient Encounter Medications as of 02/11/2021  Medication Sig   aspirin EC 81 MG tablet Take 81 mg by mouth daily.   atorvastatin (LIPITOR) 40 MG tablet Take 1 tablet (40 mg total) by mouth daily.   cetirizine (ZYRTEC) 10 MG tablet Take 10 mg by mouth as directed. PRN   Cholecalciferol (VITAMIN D) 2000 units CAPS Take 1 capsule by mouth daily.   clonazePAM (KLONOPIN) 0.5 MG tablet Take 1 tablet (0.5 mg total) by mouth at bedtime as needed for anxiety.   ezetimibe (ZETIA) 10 MG tablet TAKE ONE TABLET BY MOUTH ONCE DAILY   melatonin 3 MG TABS tablet Take 2 tablets (6 mg total) by mouth at bedtime. (Patient taking differently: Take 3 mg by mouth at bedtime.)   Multiple Vitamin (MULTI-VITAMIN PO) Take 1 tablet by mouth.   No facility-administered encounter medications on file as of 02/11/2021.    Contacted patient as it appeared he was past due on his Atorvastatin. Patient stated he was taking them as directed but still had about 10 on hand. Patient agreed to 02/17/21 delivery. Acute form has been completed and turned in to [facility].   Charlene Brooke, CPP notified  Margaretmary Dys, [place] Pharmacy Assistant [phone]

## 2021-02-16 ENCOUNTER — Telehealth: Payer: Self-pay | Admitting: Pharmacist

## 2021-02-16 NOTE — Progress Notes (Signed)
    Chronic Care Management Pharmacy Assistant   Name: Clarence Ware  MRN: [id number] DOB: June 05, 1951   Reason for Encounter: Disease State   Conditions to be addressed/monitored: HTN   Recent office visits:  None ID  Recent consult visits:  None ID  Hospital visits:  None in previous 6 months  Medications: Outpatient Encounter Medications as of 02/16/2021  Medication Sig   aspirin EC 81 MG tablet Take 81 mg by mouth daily.   atorvastatin (LIPITOR) 40 MG tablet Take 1 tablet (40 mg total) by mouth daily.   cetirizine (ZYRTEC) 10 MG tablet Take 10 mg by mouth as directed. PRN   Cholecalciferol (VITAMIN D) 2000 units CAPS Take 1 capsule by mouth daily.   clonazePAM (KLONOPIN) 0.5 MG tablet Take 1 tablet (0.5 mg total) by mouth at bedtime as needed for anxiety.   ezetimibe (ZETIA) 10 MG tablet TAKE ONE TABLET BY MOUTH ONCE DAILY   melatonin 3 MG TABS tablet Take 2 tablets (6 mg total) by mouth at bedtime. (Patient taking differently: Take 3 mg by mouth at bedtime.)   Multiple Vitamin (MULTI-VITAMIN PO) Take 1 tablet by mouth.   No facility-administered encounter medications on file as of 02/16/2021.     Recent Office Vitals: BP Readings from Last 3 Encounters:  08/05/20 128/84  08/04/20 104/62  03/16/20 (!) 142/88   Pulse Readings from Last 3 Encounters:  08/05/20 66  08/04/20 63  03/16/20 69    Wt Readings from Last 3 Encounters:  08/05/20 197 lb (89.4 kg)  08/04/20 197 lb (89.4 kg)  03/16/20 195 lb (88.5 kg)     Kidney Function Lab Results  Component Value Date/Time   CREATININE 1.02 08/05/2020 10:38 AM   CREATININE 0.91 03/02/2020 08:56 AM   CREATININE 0.96 07/22/2019 08:18 AM   CREATININE 1.06 01/13/2016 10:05 AM   GFR 74.68 08/05/2020 10:38 AM   GFRNONAA 86 03/02/2020 08:56 AM   GFRAA 99 03/02/2020 08:56 AM    BMP Latest Ref Rng & Units 08/05/2020 03/02/2020 07/22/2019  Glucose 70 - 99 mg/dL 90 98 107(H)  BUN 6 - 23 mg/dL '20 20 16  '$ Creatinine 0.40  - 1.50 mg/dL 1.02 0.91 0.96  BUN/Creat Ratio 6 - 22 (calc) - NOT APPLICABLE -  Sodium A999333 - 145 mEq/L 140 139 139  Potassium 3.5 - 5.1 mEq/L 4.4 4.4 4.1  Chloride 96 - 112 mEq/L 105 104 103  CO2 19 - 32 mEq/L 32 25 29  Calcium 8.4 - 10.5 mg/dL 9.9 9.2 9.9   Pharmacist Review Have you had any problems recently with your health?Patient states that he does not have any new health issues  Have you had any problems with your pharmacy?Patient states that he does not have getting medications or the cost of medications from the pharmacy  What issues or side effects are you having with your medications? Patient states not side effects to medications  What would you like me to pass along to [facility] for them to help you with? Patient states he has nothing to discuss at this time  What can we do to take care of you better? Patient states not at this time   Star Rating Drugs: Atorvastatin 02/14/21 24 ds  Ethelene Hal Clinical Pharmacist Assistant [phone]   Time spent:20

## 2021-02-25 ENCOUNTER — Telehealth: Payer: Self-pay | Admitting: Pharmacist

## 2021-02-25 NOTE — Progress Notes (Signed)
    Chronic Care Management Pharmacy Assistant   Name: Clarence Ware  MRN: [id number] DOB: 1950/07/17   Reason for Encounter: Medication Review    Medications: Outpatient Encounter Medications as of 02/25/2021  Medication Sig   aspirin EC 81 MG tablet Take 81 mg by mouth daily.   atorvastatin (LIPITOR) 40 MG tablet Take 1 tablet (40 mg total) by mouth daily.   cetirizine (ZYRTEC) 10 MG tablet Take 10 mg by mouth as directed. PRN   Cholecalciferol (VITAMIN D) 2000 units CAPS Take 1 capsule by mouth daily.   clonazePAM (KLONOPIN) 0.5 MG tablet Take 1 tablet (0.5 mg total) by mouth at bedtime as needed for anxiety.   ezetimibe (ZETIA) 10 MG tablet TAKE ONE TABLET BY MOUTH ONCE DAILY   melatonin 3 MG TABS tablet Take 2 tablets (6 mg total) by mouth at bedtime. (Patient taking differently: Take 3 mg by mouth at bedtime.)   Multiple Vitamin (MULTI-VITAMIN PO) Take 1 tablet by mouth.   No facility-administered encounter medications on file as of 02/25/2021.   Reviewed chart for medication changes ahead of medication coordination call.  No OVs, Consults, or hospital visits since last care coordination call/Pharmacist visit. (If appropriate, list visit date, provider name)  No medication changes indicated OR if recent visit, treatment plan here.  BP Readings from Last 3 Encounters:  08/05/20 128/84  08/04/20 104/62  03/16/20 (!) 142/88    Lab Results  Component Value Date   HGBA1C 6.2 08/05/2020     Patient obtains medications through Vials  90 Days   Last adherence delivery included:  atorvastatin (LIPITOR) 40 MG tablet  - last filled 02/08/21 24 days  Patient declined atorvastatin last month due to additional supply on hand.  Patient is due for next adherence delivery on: 03/08/21.  Called patient and reviewed medications and coordinated delivery. This delivery to include: atorvastatin (LIPITOR) 40 MG tablet  Patient declined the following medications atorvastatin will  call when he is ready for refill   [place] Pharmacist Assistant [phone]   Time spent:16

## 2021-03-09 ENCOUNTER — Other Ambulatory Visit: Payer: Self-pay

## 2021-03-09 ENCOUNTER — Encounter: Payer: Self-pay | Admitting: Cardiovascular Disease

## 2021-03-09 ENCOUNTER — Ambulatory Visit: Payer: PPO | Admitting: Cardiovascular Disease

## 2021-03-09 VITALS — BP 162/90 | HR 64 | Ht 74.0 in | Wt 193.6 lb

## 2021-03-09 DIAGNOSIS — E785 Hyperlipidemia, unspecified: Secondary | ICD-10-CM

## 2021-03-09 DIAGNOSIS — R0683 Snoring: Secondary | ICD-10-CM | POA: Diagnosis not present

## 2021-03-09 DIAGNOSIS — I10 Essential (primary) hypertension: Secondary | ICD-10-CM

## 2021-03-09 DIAGNOSIS — G4752 REM sleep behavior disorder: Secondary | ICD-10-CM | POA: Diagnosis not present

## 2021-03-09 DIAGNOSIS — I440 Atrioventricular block, first degree: Secondary | ICD-10-CM

## 2021-03-09 MED ORDER — LOSARTAN POTASSIUM 25 MG PO TABS: 12.5000 mg | ORAL_TABLET | Freq: Every day | ORAL | 3 refills | Status: DC

## 2021-03-09 NOTE — Patient Instructions (Signed)
Medication Instructions:  RESTART- Losartan 12.5 mg (1/2 Tablet) by mouth daily  *If you need a refill on your cardiac medications before your next appointment, please call your pharmacy*   Lab Work: None Ordered   Testing/Procedures: None Ordered   Follow-Up: At [facility], you and your health needs are our priority.  As part of our continuing mission to provide you with exceptional heart care, we have created designated Provider Care Teams.  These Care Teams include your primary Cardiologist (physician) and Advanced Practice Providers (APPs -  Physician Assistants and Nurse Practitioners) who all work together to provide you with the care you need, when you need it.  We recommend signing up for the patient portal called "MyChart".  Sign up information is provided on this After Visit Summary.  MyChart is used to connect with patients for Virtual Visits (Telemedicine).  Patients are able to view lab/test results, encounter notes, upcoming appointments, etc.  Non-urgent messages can be sent to your provider as well.   To learn more about what you can do with MyChart, go to [email or web address].    Your next appointment:   Keep appointment with Dr Claiborne Billings March 2023

## 2021-03-09 NOTE — Progress Notes (Signed)
Patient ID: Clarence Ware, male   DOB: 07-27-1950, 70 y.o.   MRN: [id number]    Primary M.D.: Dr. Ivar Bury  HPI: Clarence Ware is a 70 y.o. male presents to the office today for a 7 month followup cardiology evaluation.  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  Recently, another brother has a chronic aortic dissection for which she's been treated medically.  Clarence Ware has a history of hyperlipidemia and hypertension.  Over 10 years ago he had a normal routine treadmill test. The patient has continued to exercise regularly. There is a prior history of substance abuse for which he did undergo treatment.  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  He  has a history of hyperlipidemia, erectile dysfunction, a history of intermittent headaches.  When I saw him on 11/07/2012, we discussed the sensitivity and specificity of routine treadmill testing with a strong family history I elected to schedule him for a cardiopulmonary met test for further evaluation would also be helpful to assess endothelial dysfunction/diastolic dysfunction evaluate potential for microvascular angina. He also had a soft cardiac murmur at his apex and recommended a 2-D echo Doppler study.  His cardiopulmonary met test revealed that he had good functional capacity with an excellent maximum oxygen consumption peak of 92% of predicted. However, he had a abnormal response during the last several minutes of exercise suggesting ischemic myocardial dysfunction with decreasing stroke volume with increasing work rate which corresponded to an abrupt decrease in cardiac output suggestive of exercise-induced myocardial dysfunction. He did have very mild ventilation/perfusion mismatch  with reference to his pulmonary status.  A 2-D echo Doppler study showed normal systolic function with an ejection fraction of 55-60%. He had normal diastolic parameters.  When I saw him in followup of his noninvasive studies in June 2014 due to the mild abnormality noted during the last 7 minutes of exercise I recommended the addition of ARB therapy and further titrated his atorvastatin for aggressive lipid management. He has tolerated initiation of losartan.  I last saw him on most 2 years ago.  He denied any  chest pain or palpitations. He He did have a followup NMR lipoprotein of which showed an LDL particle number of 1216 with a calculated LDL of 86, HDL 46, triglycerides 58, total cholesterol 144. He had 552 small LDL some particles. HDL particle number was reduced at 29.1. Insulin resistance score was normal at 33.  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  However, he is still walking at least 3 days per week for a minimum of 30 minutes.  He underwent a sleep study at [facility] sleep at [facility] on 05/03/201 and was not found to have significant obstructive sleep apnea and his AHI and RDI were both 2.9.  He has significant periodic limb movements of sleep and had snoring.  He tells me he had a mouth piece customized by Dr. Oneal Grout for treatment of his snoring.   I saw him in January 2019.  He denies any episodes of chest pain or shortness of breath.  He is unaware of palpitations.  He uses his mouthguard to sleep  and denies any awareness of breakthrough snoring.  He continues to be on losartan 25 mg daily, and metoprolol succinate 25 mg daily for hypertension.  He has continued to be on atorvastatin 20 mg.  Of note, laboratory 6 months ago showed an LDL cholesterol, which had risen up to 90.  Diet evaluation I further titrated atorvastatin to 40 mg.  Over the last 6 months he has continued to remain stable.  He  underwent repeat laboratory on his increased atorvastatin which had not significantly changed.  Total cholesterol was 150, triglycerides 63, HDL 48, LDL 89.  He denies chest pain PND orthopnea.  He denies shortness of breath.  He has noticed some rare dizziness particularly when he bends over.    He was  evaluated in a telemedicine visit on October 11, 2018.  Prior to his evaluation in June 2019 he had self reduced his metoprolol succinate from 25 mg down to 12.5 mg due to noticing that his heart rate was morning somewhat slow.  He started to notice that particularly following exertion his blood pressure at times have gotten low and he has recorded blood pressure levels of 114/71 with a pulse of 67, 106/68 with a pulse of 65, 95/60 with a pulse of 63 and on 1 occasion systolic blood pressure had dropped to as low as 88.  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  He denied any episodes of chest pain.  He was taking losartan 25 mg daily in addition to Toprol-XL 12.5 mg daily and was on atorvastatin 40 mg and Zetia 10 mg for hyperlipidemia.  In January 2020 LDL cholesterol was 70.  He continues to take aspirin.  He admits to some fatigue associated with bradycardia.  He was using a customized oral appliance for his mild sleep apnea and followed by Dr. Oneal Grout for his snoring/sleep apnea.  I saw him in June 2021 and since his previous evaluation he was having significant dreaming and falling out of bed diving due to potential REM related sleep disorder.  He ultimately was referred to Dr. Asencion Partridge Dohmeier and apparently underwent an MRI  for assessment.  He is aware of the association of potential future Parkinson disease or Lewy body disorder associated with REM behavior parasomnias.  He does not know the results of his MRI imaging.   He denies chest pain or palpitations.  Sometimes if he stands abruptly he may note mild dizzy spells but this has improved with discontinuance of atenolol and switching to carvedilol.  I last saw him on August 04, 2020 at which time he remained stable and was trying to slow down and has been working only approximately 7 days/month.    He has not had any further significant night terrors he is on clonazepam in addition to melatonin 6 mg at bedtime.  20 pounds over the past year.  He has continued to be on losartan 25 mg.  He is on atorvastatin 40 mg and Zetia 10 mg for hyperlipidemia.  He sees Dr. [name] who checks his laboratory.    Since I last saw him, he tells me he is involved in a Parkinson's disease study in light of his random sleep night terrors.  He is now fully retired.  He is using an oral appliance  for sleep apnea followed by Dr. Ron Parker.  He denies chest pain or shortness of breath.  He is now on melatonin for his night terror and has a prescription for clonazepam but has never taken this.  He continues to be on atorvastatin 40 mg and Zetia 10 mg for hyperlipidemia.  He presents for reevaluation.   Past Medical History:  Diagnosis Date   Allergy    First degree heart block    Hyperlipidemia    Hypertension    REM sleep behavior disorder    Substance abuse [facility])     Past Surgical History:  Procedure Laterality Date   INGUINAL HERNIA REPAIR Right 03/10/2015   Procedure: OPEN RIGHT INGUINAL HERNIA REPAIR;  Surgeon: Johnathan Hausen, MD;  Location: [facility];  Service: General;  Laterality: Right;  With MESH   VASECTOMY      Allergies  Allergen Reactions   Nexium [Esomeprazole Magnesium]     Awful regurgitation    Current Outpatient Medications  Medication Sig Dispense Refill   aspirin EC 81 MG tablet Take 81 mg by mouth daily.     atorvastatin (LIPITOR) 40 MG tablet Take 1 tablet (40 mg total) by mouth daily. 90 tablet 3   cetirizine (ZYRTEC) 10 MG tablet Take 10 mg by  mouth as directed. PRN     Cholecalciferol (VITAMIN D) 2000 units CAPS Take 1 capsule by mouth daily.     clonazePAM (KLONOPIN) 0.5 MG tablet Take 1 tablet (0.5 mg total) by mouth at bedtime as needed for anxiety. 5 tablet 0   ezetimibe (ZETIA) 10 MG tablet TAKE ONE TABLET BY MOUTH ONCE DAILY 90 tablet 5   losartan (COZAAR) 25 MG tablet Take 0.5 tablets (12.5 mg total) by mouth daily. 45 tablet 3   melatonin 3 MG TABS tablet Take 2 tablets (6 mg total) by mouth at bedtime. (Patient taking differently: Take 3 mg by mouth at bedtime.) 180 tablet 1   Multiple Vitamin (MULTI-VITAMIN PO) Take 1 tablet by mouth.     No current facility-administered medications for this visit.    Socially he is married has 2 children no grandchildren. He does exercise at least 30 minutes at a time. There is a prior history of tobacco use but he quit over 3 years ago and a prior use of opiates. 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  ROS General: Negative; No fevers, chills, or night sweats;  HEENT: Negative; No changes in vision or hearing, sinus congestion, difficulty swallowing Pulmonary: Negative; No cough, wheezing, shortness of breath, hemoptysis Cardiovascular: See history of present illness GI: Negative; No nausea, vomiting, diarrhea, or abdominal pain GU: Negative; No dysuria, hematuria, or difficulty voiding Musculoskeletal: Negative; no myalgias, joint pain, or weakness Hematologic/Oncology: Negative; no easy bruising, bleeding Endocrine: Negative; no heat/cold intolerance; no diabetes Neuro: Negative; no changes in balance, headaches Skin: Negative; No rashes or skin lesions Psychiatric: Negative; No behavioral problems, depression Sleep: Sleep study negative for OSA but suspicious for increased upper airway resistance.  He did have periodic limb movements that were frequent.  No hypnogognic hallucinations, no cataplexy.   REM related sleep disorder. Other comprehensive 14 point system review is negative.  PE BP (!) 162/90   Pulse 64   Ht $R'6\' 2"'Rg$  (1.88 m)   Wt 193 lb 9.6 oz (87.8 kg)   SpO2 97%   BMI 24.86 kg/m    Repeat blood pressure by me was 140/86  supine and 135/82 standing  Wt Readings from Last 3 Encounters:  03/09/21 193 lb 9.6 oz (87.8 kg)  08/05/20 197 lb (89.4 kg)  08/04/20 197 lb (89.4 kg)   General: Alert, oriented, no distress.  Skin: normal turgor, no rashes, warm and dry HEENT: Normocephalic, atraumatic. Pupils equal round and reactive to light; sclera anicteric; extraocular muscles intact;  Nose without nasal septal hypertrophy Mouth/Parynx benign; Mallinpatti scale 3 Neck: No JVD, no carotid bruits; normal carotid upstroke Lungs: clear to ausculatation and percussion; no wheezing or rales Chest wall: without tenderness to palpitation Heart: PMI not displaced, RRR, s1 s2 normal, 1/6 systolic murmur, no diastolic murmur, no rubs, gallops, thrills, or heaves Abdomen: soft, nontender; no hepatosplenomehaly, BS+; abdominal aorta nontender and not dilated by palpation. Back: no CVA tenderness Pulses 2+ Musculoskeletal: full range of motion, normal strength, no joint deformities Extremities: no clubbing cyanosis or edema, Homan's sign negative  Neurologic: grossly nonfocal; Cranial nerves grossly wnl Psychologic: Normal mood and affect   August 31,2022 ECG (independently read by me): Sinus rhythm at 64; 1st degree AV block    August 05, 2019 ECG (independently read by me): Sinus rhythm at 63, 1st degree AV bloick: PR 236  June 2021 ECG (independently read by me): Sinus rhythm at 64; 1st degree AV block, PR 232    msec; no ectopy  November 2020 ECG (independently read by me): Sinus bradycardia 55 bpm, first-degree AV block with a PR interval 238 ms.  QTc interval 380 ms.  No ectopy.  June 2019 ECG (independently read by me): Normal sinus rhythm at 62 bpm.  First-degree AV block  with a PR interval at 240 ms.  No ectopy.  Normal intervals.   July 18, 2017 ECG (independently read by me): Normal sinus rhythm with an isolated PVC.  PR interval 202 ms.  Heart rate 63 bpm.  October 2017 ECG (independently read by me): Sinus bradycardia 57 bpm.  First-degree AV block with a PR interval at 228 ms.  No ST segment changes.  June 2016 ECG (independently read by me): Sinus rhythm with first-degree AV block.  PR interval 264 ms.  QTc interval normal 381 ms.  September 2014 ECG: Sinus bradycardia for the first degree AV block. PR interval  220 ms. QT interval 393 ms.  LABS: BMP Latest Ref Rng & Units 08/05/2020 03/02/2020 07/22/2019  Glucose 70 - 99 mg/dL 90 98 107(H)  BUN 6 - 23 mg/dL $Remove'20 20 16  'lSteeZh$ Creatinine 0.40 - 1.50 mg/dL 1.02 0.91 0.96  BUN/Creat Ratio 6 - 22 (calc) - NOT APPLICABLE -  Sodium 450 - 145 mEq/L 140 139 139  Potassium 3.5 - 5.1 mEq/L 4.4 4.4 4.1  Chloride 96 - 112 mEq/L 105 104 103  CO2 19 - 32 mEq/L 32 25 29  Calcium 8.4 - 10.5 mg/dL 9.9 9.2 9.9   Hepatic Function Latest Ref Rng & Units 07/22/2019 07/22/2018 10/24/2017  Total Protein 6.0 - 8.3 g/dL 7.0 6.8 6.8  Albumin 3.5 - 5.2 g/dL 4.3 4.3 4.5  AST 0 - 37 U/L $Remo'31 26 28  'UZqlD$ ALT 0 - 53 U/L 34 34 28  Alk Phosphatase 39 - 117 U/L 84 77 91  Total Bilirubin 0.2 - 1.2 mg/dL 0.7 0.9 0.5  Bilirubin, Direct 0.0 - 0.3 mg/dL 0.2 - -   CBC Latest Ref Rng & Units 08/05/2020 07/22/2019 07/22/2018  WBC 4.0 - 10.5 K/uL 5.1 5.3 6.3  Hemoglobin 13.0 - 17.0 g/dL 14.1 14.5 14.7  Hematocrit 39.0 -  52.0 % 41.2 43.0 43.3  Platelets 150.0 - 400.0 K/uL 175.0 194.0 209.0   Lab Results  Component Value Date   MCV 89.6 08/05/2020   MCV 90.7 07/22/2019   MCV 90.0 07/22/2018   Lab Results  Component Value Date   TSH 1.77 07/22/2019   Lab Results  Component Value Date   HGBA1C 6.2 08/05/2020     Lipid Panel     Component Value Date/Time   CHOL 118 08/05/2020 1038   CHOL 150 10/24/2017 0809   CHOL 144 03/03/2013 0855    TRIG 50.0 08/05/2020 1038   TRIG 58 03/03/2013 0855   HDL 48.50 08/05/2020 1038   HDL 48 10/24/2017 0809   HDL 46 03/03/2013 0855   CHOLHDL 2 08/05/2020 1038   VLDL 10.0 08/05/2020 1038   LDLCALC 59 08/05/2020 1038   LDLCALC 89 10/24/2017 0809   LDLCALC 86 03/03/2013 0855     RADIOLOGY: No results found.  IMPRESSION:  1. Essential hypertension   2. First degree heart block   3. Hyperlipidemia with target LDL less than 70   4. REM sleep behavior disorder   5. Soring: on oral appliance   6. 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  He has had first-degree block.  In the past, he has had some episodes of mild dizziness particularly if he bends over which has resulted in reduction in his beta-blocker regimen.  Remotely, his atenolol dose was switched to carvedilol low-dose which has been beneficial.  On subsequent evaluations, he continued to experience the dizziness and it was recommended that he ultimately wean and discontinue carvedilol.  His blood pressure today is elevated and on initial presentation was 162/90.  On repeat by me blood pressure was 140/86 supine 135/82 standing.  I have recommended he resume losartan at a reduced dose of 12.5 mg since he is no longer on this.  He is now participating in a Parkinson's study with his REM related sleep disorder.  He continues to be on Zetia and atorvastatin for hyperlipidemia.  LDL cholesterol in January 2022 was 59.  He is followed by Dr. [name] for primary care who checks laboratory.  He continues to use his oral appliance for sleep apnea followed by Dr. Ron Parker.  I will see him in 6 months for reevaluation or sooner as needed.   Troy Sine,  MD, [facility]  03/15/2021 7:47 PM

## 2021-03-15 ENCOUNTER — Encounter: Payer: Self-pay | Admitting: Cardiovascular Disease

## 2021-03-16 ENCOUNTER — Other Ambulatory Visit: Payer: Self-pay

## 2021-03-16 ENCOUNTER — Ambulatory Visit: Payer: PPO | Admitting: Neurology

## 2021-03-16 ENCOUNTER — Encounter: Payer: Self-pay | Admitting: Neurology

## 2021-03-16 VITALS — BP 168/95 | HR 56 | Ht 74.0 in | Wt 196.5 lb

## 2021-03-16 DIAGNOSIS — G4752 REM sleep behavior disorder: Secondary | ICD-10-CM

## 2021-03-16 NOTE — Patient Instructions (Signed)
REM sleep Behavior disorder.  Followed at [facility] for neuro- cognitive evaluation.    MOCA test in 12 month.

## 2021-03-16 NOTE — Progress Notes (Signed)
SLEEP MEDICINE CLINIC   Provider:  Larey Seat, M D  Referring Provider: [name], MD Primary Care Physician:  [name], MD  Chief Complaint  Patient presents with   Follow-up    Rm 10, alone. Here for yearly f/u, pt reports doing well, no new or worsening in sx.     HPI:  Clarence Ware is a 70 y.o. male , seen on 03-16-2021, I have the pleasure of seeing Clarence Ware and I really revisited.  He states that he had no need to use Klonopin over the last 12 months has not fallen out of bed and since he sleeps in a separate bedroom from his wife he is not sure that he has any enactment at all.  He feels that he is well controlled on melatonin only at this time.  As we know he had been SNRI for a while but we never found a significant amount of apnea warranting any intervention.  He has been doing okay with a mouthguard which controlled his snoring but unfortunately has led to a shift in his teeth.  His fatigue severity scale today was endorsed at only 14 points and his Epworth sleepiness score at 3 points.  The patient has been diagnosed with REM behavior disorder and he is now enrolled in the [employer] study.  He enrolled online, he performed a sniff test in which he must have not done well, and is now scheduled- in [place] -to undergo a DAT scan.        Last seen in a Rv after sleep study on 03-16-2020.  He underwent a study on 25 January 2020 which showed no evidence of obstructive sleep apnea and there was no clinically relevant periodic limb movement especially not in REM sleep.  Normal EKG no drop in oxygen and treatment suggestions were made to stop with melatonin or an SSRI and to hold off on Klonopin as it is a potentially habit-forming medication.  However when he travels or may have for some reason unusual circumstances more stress sleep deprivation etc. it may well be worth having Klonopin as a rescue medication for that night.  The couple  now sleeps in different rooms because of the REM enactment. He is still shouting.   He takes 3 mg melatonin, which may have reduced the physical activity.  He can use klonopin for rescue in certain situations, in light of his substance abuse history.    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 He was seen here as a referral from Dr. Ronnald Ramp for for a sleep consultation for the first time in 10-2015. Now he is here for a different concern , he lost weight, he was considered pre-diabetic, and his snoring is less. He is using a mouth piece made to measure by Dr. Ron Parker. No longer wakes up with headaches when wearing his mouth piece.     Chief complaint according to patient :" Recently the enactment of dreams has become a more prominent feature of his sleep, and of this year he had fallen out of bed dove out of bed as reported also broke nightstand lamp in the process.  REM sleep behavior was not a feature rehab explored when we first met over 4 years ago so it is a fairly recent event.  Clarence Ware has respiratory allergies and of hyperlipidemia and hypertension, pre diabetes- all improved with weight loss.  he has no history of morbid obesity, and no history of  shift work.  Sleep habits are as follows: Patient usually goes to bed around 9-9.30 PM and rises at 7 AM.  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  He usually falls asleep promptly after going to bed, he likes to read in bed, he sleeps on the side when he falls asleep but during the night resume supine position -he snores much less after weight loss and with mouth guard. . He sleeps on a flat bed, on 2 pillows. He does not watch TV in the bedroom, neither does his wife. He may have one bathroom break at night, rarely 2.  The dream enactment is around midnight or later-  This means he will get about 9 hours of sleep at night. He uses a phone- alarm to wake up in the morning at about 6 -7 AM, never uses the snooze button.   Many mornings he is just awake before the alarm rings. No longer has a  dry, parched mouth or sore throat.   Sleep medical history and family sleep history: The patient is unaware of any close family members that may have a sleep disorder or may be treated for sleep disorder. Mother had Picks frontal lobe dementia.   Social history: Clarence Ware is married, lives with his wife, he no longer smokes and no longer takes pain medication.  2 grown children . No grandchildren.  Wife works part time.   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 He may drink another diet soda with caffeine during the day.  Again he no longer smokes or uses alcohol.   Review of Systems: Out of a complete 14 system review, the patient complains of only the following symptoms, and all other reviewed systems are negative. REM BD- yelling, screaming and has fallen out of bed, has bruised his right shoulder . Rarely has he woken himself.   How likely are you to doze in the following situations: 0 = not likely, 1 = slight chance, 2 = moderate chance, 3 = high chance  Sitting and Reading? Watching Television? Sitting inactive in a public place (theater or meeting)? Lying down in the afternoon when circumstances permit? Sitting and talking to someone? Sitting quietly after lunch without alcohol? In a car, while stopped for a few minutes in traffic? As a passenger in a car for an hour without a break?  Total = 5    Epworth score 5 , Fatigue severity score 31 , depression score 2.   Social History   Socioeconomic History   Marital status: Married    Spouse name: Clarence Ware   Number of children: 2   Years of education: Not on file   Highest education level: Not on file  Occupational History   Occupation: Pharmacist part-time  Tobacco Use   Smoking status: Former    Packs/day:  0.25    Years: 1.00    Pack years: 0.25    Types: Cigarettes    Quit date: 07/10/1992    Years since quitting: 28.7   Smokeless tobacco: Never  Vaping Use   Vaping Use: Never used  Substance and Sexual Activity   Alcohol use: No    Alcohol/week: 0.0 standard drinks   Drug use: Not Currently    Comment: sobrity for 10 years   Sexual activity: Yes  Other Topics Concern   Not on  file  Social History Narrative   Married. Education: [employer].   Exercise: some   Caffeine- coffee 2 c daily   Social Determinants of Health   Financial Resource Strain: Low Risk    Difficulty of Paying Living Expenses: Not hard at all  Food Insecurity: No Food Insecurity   Worried About Charity fundraiser in the Last Year: Never true   Ran Out of Food in the Last Year: Never true  Transportation Needs: No Transportation Needs   Lack of Transportation (Medical): No   Lack of Transportation (Non-Medical): No  Physical Activity: Sufficiently Active   Days of Exercise per Week: 6 days   Minutes of Exercise per Session: 30 min  Stress: No Stress Concern Present   Feeling of Stress : Not at all  Social Connections: Not on file  Intimate Partner Violence: Not on file    Family History  Problem Relation Age of Onset   Heart disease Mother    Stroke Mother    Heart disease Father 24       MI   Hypertension Father    Hypertension Brother    Cancer Brother    Diabetes Brother    Heart disease Brother    Hyperlipidemia Brother    Cancer Brother    Hyperlipidemia Sister    Coronary artery disease Brother 10       CABG   Colon cancer Paternal Grandmother     Past Medical History:  Diagnosis Date   Allergy    First degree heart block    Hyperlipidemia    Hypertension    REM sleep behavior disorder 2019   Substance abuse ([place])     Past Surgical History:  Procedure Laterality Date   INGUINAL HERNIA REPAIR Right 03/10/2015   Procedure: OPEN RIGHT INGUINAL HERNIA REPAIR;  Surgeon: Johnathan Hausen, MD;  Location: [facility];  Service: General;  Laterality: Right;  With MESH   VASECTOMY      Current Outpatient Medications  Medication Sig Dispense Refill   aspirin EC 81 MG tablet Take 81 mg by mouth daily.     atorvastatin (LIPITOR) 40 MG tablet Take 1 tablet (40 mg total) by mouth daily. 90 tablet 3   cetirizine (ZYRTEC) 10 MG tablet Take 10 mg by mouth as directed. PRN     Cholecalciferol (VITAMIN D) 2000 units CAPS Take 1 capsule by mouth daily.     clonazePAM (KLONOPIN) 0.5 MG tablet Take 1 tablet (0.5 mg total) by mouth at bedtime as needed for anxiety. 5 tablet 0   ezetimibe (ZETIA) 10 MG tablet TAKE ONE TABLET BY MOUTH ONCE DAILY 90 tablet 5   losartan (COZAAR) 25 MG tablet Take 0.5 tablets (12.5 mg total) by mouth daily. 45 tablet 3   melatonin 3 MG TABS tablet Take 2 tablets (6 mg total) by mouth at bedtime. (Patient taking differently: Take 3 mg by mouth at bedtime.) 180 tablet 1   No current facility-administered medications for this visit.    Allergies as of 03/16/2021 - Review Complete 03/15/2021  Allergen Reaction Noted   Nexium [esomeprazole magnesium]  03/14/2013    Vitals: BP (!) 168/95   Pulse (!) 56   Ht _0  (1.88 m)   Wt 196 lb 8 oz (89.1 kg)   BMI 25.23 kg/m  Last Weight:  Wt Readings from Last 1 Encounters:  03/16/21 196 lb 8 oz (89.1 kg)   XKG:YJEH mass index is 25.23 kg/m.  Last Height:   Ht Readings from Last 1 Encounters:  03/16/21 _0  (1.88 m)    Physical exam:  General: The patient is awake, alert and appears not in acute distress.  The patient is well groomed. He is much slimmer than in 2017.    Head: Normocephalic, atraumatic. Neck is supple. Mallampati 3- tip of the uvula behind the tongue ground.  There is no longer evidence of a scalloped tongue/  neck circumference now 16. 25"  . Nasal airflow restricted ,  he had deviated septum surgery. TMJ not  evident . Retrognathia is not seen.  Full facial hair.   Cardiovascular:  Regular rate and rhythm, without  murmurs or carotid bruit, and without distended neck veins. Respiratory: Lungs are clear to auscultation. Skin:  Without evidence of edema, or rash Trunk: BMI is 25 kg/ m2 from 28. The patient's posture is erect  Neurologic exam : The patient is awake and alert, oriented to place and time.   Memory subjective  described as intact.   Attention span & concentration ability appears normal.  Speech is fluent,  without dysarthria, dysphonia or aphasia.  Mood and affect are appropriate.  Cranial nerves:  in an abbreviated smell test, patient couldn't identify  Citrus or vanilla smell.  Pupils are equal and briskly reactive to light. Funduscopic exam deferred.  Extraocular movements  in vertical and horizontal planes intact and without nystagmus. Visual fields by finger perimetry are intact. Hearing to finger rub intact- wearing hearing aids in both ears. Facial sensation intact to fine touch. Facial motor strength is symmetric and tongue and uvula move midline. Shoulder shrug was symmetrical.   Motor exam:  Normal tone, muscle bulk and symmetric strength in all extremities. Loose wrist, fully rleaxed biceps , no cogwheeling, still - good handwriting. . Slightly more tone on his right shoulder , related to ROM Sensory:  Fine touch and vibration were tested in all extremities.  Proprioception tested in the upper extremities was normal. Coordination:  Reports no changes in penmanship. Rapid alternating movements in the fingers/hands intact. Finger-to-nose maneuver  normal without evidence of ataxia, dysmetria or tremor. Very fluent.  Gait and station: Patient walks without assistive device and is able unassisted to climb up to the exam table. Strength within normal limits.  Stance is stable and normal.   Deep tendon reflexes: in the upper and lower extremities are symmetric and intact. Babinski maneuver deferred.   The patient was advised of the  nature of the diagnosed sleep disorder , the treatment options and risks for general a health and wellness arising from not treating the condition.  I spent more than 35 minutes of face to face time with the patient.   Greater than 50% of time was spent in counseling and coordination of care. We have discussed the diagnosis and differential and I answered the patient's questions.    Assessment:  After physical and neurologic examination, review of laboratory studies,  Personal review of imaging studies, reports of other /same  Imaging studies ,  Results of polysomnography/ neurophysiology testing and pre-existing records as far as provided in visit., my assessment is ;  After the infusion of contrast material, a normal enhancement pattern is noted.     IMPRESSION:   2020 study  1.  Couple punctate T2/FLAIR hyperintense foci in the subcortical or deep white matter of the hemispheres consistent with very minimal chronic microvascular ischemic change, typical for age. 2.  Mild ethmoid, sphenoid and right frontal chronic sinusitis.  3.  There were no acute findings and there was a normal enhancement pattern.         INTERPRETING PHYSICIAN:  Han A. Felecia Shelling, MD, PhD, FAAN Certified in  Neuroimaging by [employer]  1)  UARS - uses a dental device ( made to measure with Dr Ron Parker.) The patient has gained control of his UARS, snoring and very mild apnea, AHI was 2.5/h in 5/ 2017 . AHI was now 0.5/h and RDI was not scored. He is status quo- no need for intervention.   2) new onset REM BD 2019- first symptoms in retrospect in 2018. - clear evidence of onset after midnight- not a slow wave sleep parasomnia. Usually stays in bed , thrashes and yells, but recently has fallen out of bed. Melatonin was initiated most recently 3 weeks ago, but he still yells and screams, has not fallen out of bed since.   Continues with  3 mg qhs melatonin, may use klonopin for extreme situations.   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  He is now enrolled in a PD study with the [employer], enrolled at step 2, smell test, now pending DAT scan for imaging of dopaminergic structural  pathways. He enrolled online. He got kicked out of the Alzheimer's study.    Plan:  Treatment plan and additional workup :  BP variable , had been taken off BP medications by his cardiologist, Dr Ellouise Newer. Orthostatic BP were taken in his office. He had lightheadedness, beta blocker and losartin were d/c. Now , 2.5 weeks ago back on 2.5 mg losartin.   Starting Klonopin only if Melatonin fails to control - patient and wife sleep in separate rooms.  He has a substance abuse history and would not like to start this medication, for obvious reasons.  Would be preferable to use an SSRI for REM suppression .   Snoring is controlled with mouthguard.   He had no need for klonopin in over 12 month, but didn't travel either.    Asencion Partridge Erie Grosser MD  03/16/2021   CC: [name], Md [address] [place],  [place] [address]

## 2021-04-01 DIAGNOSIS — H5211 Myopia, right eye: Secondary | ICD-10-CM | POA: Diagnosis not present

## 2021-04-01 DIAGNOSIS — H2513 Age-related nuclear cataract, bilateral: Secondary | ICD-10-CM | POA: Diagnosis not present

## 2021-04-01 DIAGNOSIS — H35373 Puckering of macula, bilateral: Secondary | ICD-10-CM | POA: Diagnosis not present

## 2021-04-01 DIAGNOSIS — H25043 Posterior subcapsular polar age-related cataract, bilateral: Secondary | ICD-10-CM | POA: Diagnosis not present

## 2021-04-11 ENCOUNTER — Other Ambulatory Visit: Payer: Self-pay | Admitting: Internal Medicine

## 2021-04-11 ENCOUNTER — Encounter: Payer: Self-pay | Admitting: Internal Medicine

## 2021-04-11 DIAGNOSIS — I10 Essential (primary) hypertension: Secondary | ICD-10-CM

## 2021-04-11 MED ORDER — LOSARTAN POTASSIUM 25 MG PO TABS: 25.0000 mg | ORAL_TABLET | Freq: Every day | ORAL | 0 refills | Status: DC

## 2021-04-26 ENCOUNTER — Telehealth: Payer: Self-pay | Admitting: Pharmacist

## 2021-04-26 NOTE — Progress Notes (Signed)
    Chronic Care Management Pharmacy Assistant   Name: Clarence Ware  MRN: [id number] DOB: [birth year]   Reason for Encounter: Medication Review    Recent office visits:  None ID  Recent consult visits:  03/16/21 Dohmeier, Asencion Partridge, MD-Neurology (Uncontrolled REM sleep behavior disorder) no med changes  03/09/21 Troy Sine, MD-Cardiology (Essential hypertension)med changes:RESTART- Losartan 12.5 mg (1/2 Tablet) by mouth daily    Hospital visits:  None in previous 6 months  Medications: Outpatient Encounter Medications as of 04/26/2021  Medication Sig   aspirin EC 81 MG tablet Take 81 mg by mouth daily.   atorvastatin (LIPITOR) 40 MG tablet Take 1 tablet (40 mg total) by mouth daily.   cetirizine (ZYRTEC) 10 MG tablet Take 10 mg by mouth as directed. PRN   Cholecalciferol (VITAMIN D) 2000 units CAPS Take 1 capsule by mouth daily.   clonazePAM (KLONOPIN) 0.5 MG tablet Take 1 tablet (0.5 mg total) by mouth at bedtime as needed for anxiety.   ezetimibe (ZETIA) 10 MG tablet TAKE ONE TABLET BY MOUTH ONCE DAILY   losartan (COZAAR) 25 MG tablet Take 1 tablet (25 mg total) by mouth daily.   melatonin 3 MG TABS tablet Take 2 tablets (6 mg total) by mouth at bedtime. (Patient taking differently: Take 3 mg by mouth at bedtime.)   No facility-administered encounter medications on file as of 04/26/2021.   BP Readings from Last 3 Encounters:  03/16/21 (!) 168/95  03/09/21 (!) 162/90  08/05/20 128/84    Lab Results  Component Value Date   HGBA1C 6.2 08/05/2020      Last adherence delivery date:04/07/21      Patient is due for next adherence delivery on: 05/06/21  Spoke with patient on 05/06/21 reviewed medications and coordinated delivery.  This delivery to include: Vials  90 Days  Ezetimibe 10 mg 1 tab at breakfast Atorvastatin 40 mg 1 tab at breakfast Losartan 25 mg 1 tab at breakfast  Patient declined the following medications this month:  Any concerns about your  medications? No  How often do you forget or accidentally miss a dose? Never  Do you use a pillbox? No  Is patient in packaging No    No refill request needed.  Confirmed delivery date of 05/06/21, advised patient that pharmacy will contact them the morning of delivery.   Annual wellness visit in last year? No Most Recent BP reading:168/95 on 03/16/21 at Dr. Maureen Chatters office   Star Rating Drugs: Losartan 25 mg last fill 03/09/21 Atorvastatin 40 mg last fill 04/07/21  [place] Pharmacist Assistant [phone]

## 2021-05-03 DIAGNOSIS — H25042 Posterior subcapsular polar age-related cataract, left eye: Secondary | ICD-10-CM | POA: Diagnosis not present

## 2021-05-03 DIAGNOSIS — H25812 Combined forms of age-related cataract, left eye: Secondary | ICD-10-CM | POA: Diagnosis not present

## 2021-05-03 DIAGNOSIS — H2512 Age-related nuclear cataract, left eye: Secondary | ICD-10-CM | POA: Diagnosis not present

## 2021-06-06 ENCOUNTER — Ambulatory Visit (INDEPENDENT_AMBULATORY_CARE_PROVIDER_SITE_OTHER): Payer: PPO

## 2021-06-06 DIAGNOSIS — Z0000 Encounter for general adult medical examination without abnormal findings: Secondary | ICD-10-CM | POA: Diagnosis not present

## 2021-06-06 NOTE — Progress Notes (Signed)
Subjective:   Clarence Ware is a 70 y.o. male who presents for an Subsequent Medicare Annual Wellness Visit.  I connected with Clarence Ware today by telephone and verified that I am speaking with the correct person using two identifiers. Location patient: home Location provider: work Persons participating in the virtual visit: patient, provider.   I discussed the limitations, risks, security and privacy concerns of performing an evaluation and management service by telephone and the availability of in person appointments. I also discussed with the patient that there may be a patient responsible charge related to this service. The patient expressed understanding and verbally consented to this telephonic visit.    Interactive audio and video telecommunications were attempted between this provider and patient, however failed, due to patient having technical difficulties OR patient did not have access to video capability.  We continued and completed visit with audio only.    Review of Systems     Cardiac Risk Factors include: advanced age (>5men, >56 women);dyslipidemia;male gender;hypertension     Objective:    Today's Vitals   There is no height or weight on file to calculate BMI.  Advanced Directives 06/06/2021 03/19/2020 02/23/2020 03/07/2019 12/27/2018 01/25/2018 01/19/2017  Does Patient Have a Medical Advance Directive? Yes Yes No Yes Yes Yes Yes  Type of Paramedic of [place];Living will - - [place];Living will Healthcare Power of Clarence Ware;Living will Clarence Ware;Living will  Does patient want to make changes to medical advance directive? - No - Patient declined - - - - -  Copy of [place] in Chart? No - copy requested - - No - copy requested - No - copy requested Yes    Current Medications (verified) Outpatient Encounter Medications as of 06/06/2021  Medication Sig    aspirin EC 81 MG tablet Take 81 mg by mouth daily.   atorvastatin (LIPITOR) 40 MG tablet Take 1 tablet (40 mg total) by mouth daily.   cetirizine (ZYRTEC) 10 MG tablet Take 10 mg by mouth as directed. PRN   Cholecalciferol (VITAMIN D) 2000 units CAPS Take 1 capsule by mouth daily.   clonazePAM (KLONOPIN) 0.5 MG tablet Take 1 tablet (0.5 mg total) by mouth at bedtime as needed for anxiety.   ezetimibe (ZETIA) 10 MG tablet TAKE ONE TABLET BY MOUTH ONCE DAILY   losartan (COZAAR) 25 MG tablet Take 1 tablet (25 mg total) by mouth daily.   melatonin 3 MG TABS tablet Take 2 tablets (6 mg total) by mouth at bedtime. (Patient taking differently: Take 3 mg by mouth at bedtime.)   No facility-administered encounter medications on file as of 06/06/2021.    Allergies (verified) Nexium [esomeprazole magnesium]   History: Past Medical History:  Diagnosis Date   Allergy    First degree heart block    Hyperlipidemia    Hypertension    REM sleep behavior disorder    Substance abuse [facility])    Past Surgical History:  Procedure Laterality Date   INGUINAL HERNIA REPAIR Right 03/10/2015   Procedure: OPEN RIGHT INGUINAL HERNIA REPAIR;  Surgeon: Johnathan Hausen, MD;  Location: [facility];  Service: General;  Laterality: Right;  With MESH   VASECTOMY     Family History  Problem Relation Age of Onset   Heart disease Mother    Stroke Mother    Heart disease Father 28       MI   Hypertension Father    Hypertension Brother  Cancer Brother    Diabetes Brother    Heart disease Brother    Hyperlipidemia Brother    Cancer Brother    Hyperlipidemia Sister    Coronary artery disease Brother 77       CABG   Colon cancer Paternal Grandmother    Social History   Socioeconomic History   Marital status: Married    Spouse name: Dorothy   Number of children: 2   Years of education: Not on file   Highest education level: Not on file  Occupational History   Occupation: Pharmacist part-time  Tobacco Use    Smoking status: Former    Packs/day: 0.25    Years: 1.00    Pack years: 0.25    Types: Cigarettes    Quit date: 07/10/1992    Years since quitting: 28.9   Smokeless tobacco: Never  Vaping Use   Vaping Use: Never used  Substance and Sexual Activity   Alcohol use: No    Alcohol/week: 0.0 standard drinks   Drug use: Not Currently    Comment: sobrity for 10 years   Sexual activity: Yes  Other Topics Concern   Not on file  Social History Narrative   Married. Education: [employer].   Exercise: some   Caffeine- coffee 2 c daily   Social Determinants of Health   Financial Resource Strain: Low Risk    Difficulty of Paying Living Expenses: Not hard at all  Food Insecurity: No Food Insecurity   Worried About Charity fundraiser in the Last Year: Never true   Ran Out of Food in the Last Year: Never true  Transportation Needs: No Transportation Needs   Lack of Transportation (Medical): No   Lack of Transportation (Non-Medical): No  Physical Activity: Sufficiently Active   Days of Exercise per Week: 7 days   Minutes of Exercise per Session: 30 min  Stress: No Stress Concern Present   Feeling of Stress : Not at all  Social Connections: Moderately Integrated   Frequency of Communication with Friends and Family: Twice a week   Frequency of Social Gatherings with Friends and Family: Twice a week   Attends Religious Services: Never   Marine scientist or Organizations: Yes   Attends Music therapist: More than 4 times per year   Marital Status: Married    Tobacco Counseling Counseling given: Not Answered   Clinical Intake:  Pre-visit preparation completed: Yes  Pain : No/denies pain     Nutritional Risks: None Diabetes: No  How often do you need to have someone help you when you read instructions, pamphlets, or other written materials from your doctor or pharmacy?: 1 - Never What is the last grade level you completed in school?: BS  Diabetic?no    Interpreter Needed?: No  Information entered by :: [place]   Activities of Daily Living In your present state of health, do you have any difficulty performing the following activities: 06/06/2021  Hearing? N  Vision? N  Difficulty concentrating or making decisions? N  Walking or climbing stairs? N  Dressing or bathing? N  Doing errands, shopping? N  Preparing Food and eating ? N  Using the Toilet? N  In the past six months, have you accidently leaked urine? N  Do you have problems with loss of bowel control? N  Managing your Medications? N  Managing your Finances? N  Housekeeping or managing your Housekeeping? N  Some recent data might be hidden    Patient Care Team:  [name], MD as PCP - General (Internal Medicine) Troy Sine, MD as PCP - Cardiology (Cardiology) Franchot Gallo, MD as Consulting Physician (Urology) Katy Apo, MD as Consulting Physician (Ophthalmology) Irene Shipper, MD as Consulting Physician (Gastroenterology) Charlton Haws, [facility] as Pharmacist (Pharmacist)  Indicate any recent Medical Services you may have received from other than [facility] providers in the past year (date may be approximate).     Assessment:   This is a routine wellness examination for Clarence Ware.  Hearing/Vision screen Vision Screening - Comments:: Annual eye exams wears glasses   Dietary issues and exercise activities discussed: Current Exercise Habits: Home exercise routine, Type of exercise: walking, Time (Minutes): 30, Frequency (Times/Week): 7, Weekly Exercise (Minutes/Week): 210, Intensity: Mild, Exercise limited by: None identified   Goals Addressed             This Visit's Progress    Patient Stated   On track    Continue to eat healthy and more natural, increase my exercise to be routine, travel, enjoy life, family and continue to be active in the 12 step program.       Depression Screen PHQ 2/9 Scores 06/06/2021 06/06/2021 03/19/2020  07/24/2019 03/07/2019 07/22/2018 01/25/2018  PHQ - 2 Score 0 0 0 0 0 0 0  PHQ- 9 Score - - - - - - 0    Fall Risk Fall Risk  06/06/2021 03/19/2020 12/11/2019 07/24/2019 03/07/2019  Falls in the past year? 0 1 1 1  0  Number falls in past yr: 0 0 0 1 0  Injury with Fall? 0 1 0 0 0  Risk for fall due to : - Other (Comment) - Other (Comment) -  Risk for fall due to: Comment - Fell in the woods; had cut - Patient fell in holes in yard while doing yard work. -  Follow up Falls evaluation completed Falls evaluation completed - Falls evaluation completed -    FALL RISK PREVENTION PERTAINING TO THE HOME:  Any stairs in or around the home? Yes  If so, are there any without handrails? No  Home free of loose throw rugs in walkways, pet beds, electrical cords, etc? No  Adequate lighting in your home to reduce risk of falls? Yes   ASSISTIVE DEVICES UTILIZED TO PREVENT FALLS:  Life alert? No  Use of a cane, walker or w/c? No  Grab bars in the bathroom? Yes  Shower chair or bench in shower? Yes  Elevated toilet seat or a handicapped toilet? Yes   Cognitive Function:    Normal cognitive status assessed by direct observation by this Nurse Health Advisor. No abnormalities found.      Immunizations Immunization History  Administered Date(s) Administered   Hepatitis B 06/09/2013   Influenza, High Dose Seasonal PF 05/02/2019   Influenza-Unspecified 04/09/2014, 04/27/2018, 05/02/2019   Moderna Sars-Covid-2 Vaccination 07/08/2019, 08/05/2019   Pneumococcal Conjugate-13 01/13/2016   Pneumococcal Polysaccharide-23 01/18/2017   Tdap 10/03/2011, 02/23/2020   Zoster Recombinat (Shingrix) 03/02/2018, 05/19/2018   Zoster, Live 06/09/2013    TDAP status: Up to date  Flu Vaccine status: Up to date  Pneumococcal vaccine status: Up to date  Covid-19 vaccine status: Completed vaccines  Qualifies for Shingles Vaccine? Yes   Zostavax completed Yes   Shingrix Completed?: Yes  Screening Tests Health  Maintenance  Topic Date Due   COVID-19 Vaccine (3 - Booster for Moderna series) 09/30/2019   INFLUENZA VACCINE  02/07/2021   COLONOSCOPY (Pts 45-25yrs Insurance coverage will need to be  confirmed)  03/18/2025   TETANUS/TDAP  02/22/2030   Pneumonia Vaccine 18+ Years old  Completed   Hepatitis C Screening  Completed   Zoster Vaccines- Shingrix  Completed   HPV VACCINES  Aged Out    Health Maintenance  Health Maintenance Due  Topic Date Due   COVID-19 Vaccine (3 - Booster for Moderna series) 09/30/2019   INFLUENZA VACCINE  02/07/2021    Colorectal cancer screening: Type of screening: Colonoscopy. Completed 09/19/20216. Repeat every 10 years  Lung Cancer Screening: (Low Dose CT Chest recommended if Age 39-80 years, 30 pack-year currently smoking OR have quit w/in 15years.) does not qualify.   Lung Cancer Screening Referral: n/a  Additional Screening:  Hepatitis C Screening: does qualify; Completed 01/13/2016  Vision Screening: Recommended annual ophthalmology exams for early detection of glaucoma and other disorders of the eye. Is the patient up to date with their annual eye exam?  Yes  Who is the provider or what is the name of the office in which the patient attends annual eye exams? Dr.Lyles  If pt is not established with a provider, would they like to be referred to a provider to establish care? No .   Dental Screening: Recommended annual dental exams for proper oral hygiene  Community Resource Referral / Chronic Care Management: CRR required this visit?  No   CCM required this visit?  No      Plan:     I have personally reviewed and noted the following in the patient's chart:   Medical and social history Use of alcohol, tobacco or illicit drugs  Current medications and supplements including opioid prescriptions. Patient is not currently taking opioid prescriptions. Functional ability and status Nutritional status Physical activity Advanced directives List of  other physicians Hospitalizations, surgeries, and ER visits in previous 12 months Vitals Screenings to include cognitive, depression, and falls Referrals and appointments  In addition, I have reviewed and discussed with patient certain preventive protocols, quality metrics, and best practice recommendations. A written personalized care plan for preventive services as well as general preventive health recommendations were provided to patient.     Randel Pigg, LPN   [id number]   Nurse Notes: none

## 2021-06-06 NOTE — Patient Instructions (Signed)
[name] , Thank you for taking time to come for your Medicare Wellness Visit. I appreciate your ongoing commitment to your health goals. Please review the following plan we discussed and let me know if I can assist you in the future.   Screening recommendations/referrals: Colonoscopy: 03/19/2015 Recommended yearly ophthalmology/optometry visit for glaucoma screening and checkup Recommended yearly dental visit for hygiene and checkup  Vaccinations: Influenza vaccine: completed  Pneumococcal vaccine: completed  Tdap vaccine: 02/23/2020 Shingles vaccine: completed     Advanced directives: will provide copies   Conditions/risks identified: none   Next appointment: none   Preventive Care 71 Years and Older, Male Preventive care refers to lifestyle choices and visits with your health care provider that can promote health and wellness. What does preventive care include? A yearly physical exam. This is also called an annual well check. Dental exams once or twice a year. Routine eye exams. Ask your health care provider how often you should have your eyes checked. Personal lifestyle choices, including: Daily care of your teeth and gums. Regular physical activity. Eating a healthy diet. Avoiding tobacco and drug use. Limiting alcohol use. Practicing safe sex. Taking low doses of aspirin every day. Taking vitamin and mineral supplements as recommended by your health care provider. What happens during an annual well check? The services and screenings done by your health care provider during your annual well check will depend on your age, overall health, lifestyle risk factors, and family history of disease. Counseling  Your health care provider may ask you questions about your: Alcohol use. Tobacco use. Drug use. Emotional well-being. Home and relationship well-being. Sexual activity. Eating habits. History of falls. Memory and ability to understand (cognition). Work and work  Statistician. Screening  You may have the following tests or measurements: Height, weight, and BMI. Blood pressure. Lipid and cholesterol levels. These may be checked every 5 years, or more frequently if you are over 38 years old. Skin check. Lung cancer screening. You may have this screening every year starting at age 24 if you have a 30-pack-year history of smoking and currently smoke or have quit within the past 15 years. Fecal occult blood test (FOBT) of the stool. You may have this test every year starting at age 65. Flexible sigmoidoscopy or colonoscopy. You may have a sigmoidoscopy every 5 years or a colonoscopy every 10 years starting at age 40. Prostate cancer screening. Recommendations will vary depending on your family history and other risks. Hepatitis C blood test. Hepatitis B blood test. Sexually transmitted disease (STD) testing. Diabetes screening. This is done by checking your blood sugar (glucose) after you have not eaten for a while (fasting). You may have this done every 1-3 years. Abdominal aortic aneurysm (AAA) screening. You may need this if you are a current or former smoker. Osteoporosis. You may be screened starting at age 64 if you are at high risk. Talk with your health care provider about your test results, treatment options, and if necessary, the need for more tests. Vaccines  Your health care provider may recommend certain vaccines, such as: Influenza vaccine. This is recommended every year. Tetanus, diphtheria, and acellular pertussis (Tdap, Td) vaccine. You may need a Td booster every 10 years. Zoster vaccine. You may need this after age 22. Pneumococcal 13-valent conjugate (PCV13) vaccine. One dose is recommended after age 80. Pneumococcal polysaccharide (PPSV23) vaccine. One dose is recommended after age 103. Talk to your health care provider about which screenings and vaccines you need and how often  you need them. This information is not intended to replace  advice given to you by your health care provider. Make sure you discuss any questions you have with your health care provider. Document Released: 07/23/2015 Document Revised: 03/15/2016 Document Reviewed: 04/27/2015 Elsevier Interactive Patient Education  2017 [place] Prevention in the Home Falls can cause injuries. They can happen to people of all ages. There are many things you can do to make your home safe and to help prevent falls. What can I do on the outside of my home? Regularly fix the edges of walkways and driveways and fix any cracks. Remove anything that might make you trip as you walk through a door, such as a raised step or threshold. Trim any bushes or trees on the path to your home. Use bright outdoor lighting. Clear any walking paths of anything that might make someone trip, such as rocks or tools. Regularly check to see if handrails are loose or broken. Make sure that both sides of any steps have handrails. Any raised decks and porches should have guardrails on the edges. Have any leaves, snow, or ice cleared regularly. Use sand or salt on walking paths during winter. Clean up any spills in your garage right away. This includes oil or grease spills. What can I do in the bathroom? Use night lights. Install grab bars by the toilet and in the tub and shower. Do not use towel bars as grab bars. Use non-skid mats or decals in the tub or shower. If you need to sit down in the shower, use a plastic, non-slip stool. Keep the floor dry. Clean up any water that spills on the floor as soon as it happens. Remove soap buildup in the tub or shower regularly. Attach bath mats securely with double-sided non-slip rug tape. Do not have throw rugs and other things on the floor that can make you trip. What can I do in the bedroom? Use night lights. Make sure that you have a light by your bed that is easy to reach. Do not use any sheets or blankets that are too big for your bed.  They should not hang down onto the floor. Have a firm chair that has side arms. You can use this for support while you get dressed. Do not have throw rugs and other things on the floor that can make you trip. What can I do in the kitchen? Clean up any spills right away. Avoid walking on wet floors. Keep items that you use a lot in easy-to-reach places. If you need to reach something above you, use a strong step stool that has a grab bar. Keep electrical cords out of the way. Do not use floor polish or wax that makes floors slippery. If you must use wax, use non-skid floor wax. Do not have throw rugs and other things on the floor that can make you trip. What can I do with my stairs? Do not leave any items on the stairs. Make sure that there are handrails on both sides of the stairs and use them. Fix handrails that are broken or loose. Make sure that handrails are as long as the stairways. Check any carpeting to make sure that it is firmly attached to the stairs. Fix any carpet that is loose or worn. Avoid having throw rugs at the top or bottom of the stairs. If you do have throw rugs, attach them to the floor with carpet tape. Make sure that you have a light  switch at the top of the stairs and the bottom of the stairs. If you do not have them, ask someone to add them for you. What else can I do to help prevent falls? Wear shoes that: Do not have high heels. Have rubber bottoms. Are comfortable and fit you well. Are closed at the toe. Do not wear sandals. If you use a stepladder: Make sure that it is fully opened. Do not climb a closed stepladder. Make sure that both sides of the stepladder are locked into place. Ask someone to hold it for you, if possible. Clearly mark and make sure that you can see: Any grab bars or handrails. First and last steps. Where the edge of each step is. Use tools that help you move around (mobility aids) if they are needed. These  include: Canes. Walkers. Scooters. Crutches. Turn on the lights when you go into a dark area. Replace any light bulbs as soon as they burn out. Set up your furniture so you have a clear path. Avoid moving your furniture around. If any of your floors are uneven, fix them. If there are any pets around you, be aware of where they are. Review your medicines with your doctor. Some medicines can make you feel dizzy. This can increase your chance of falling. Ask your doctor what other things that you can do to help prevent falls. This information is not intended to replace advice given to you by your health care provider. Make sure you discuss any questions you have with your health care provider. Document Released: 04/22/2009 Document Revised: 12/02/2015 Document Reviewed: 07/31/2014 Elsevier Interactive Patient Education  2017 [employer].

## 2021-06-21 DIAGNOSIS — H25811 Combined forms of age-related cataract, right eye: Secondary | ICD-10-CM | POA: Diagnosis not present

## 2021-06-21 DIAGNOSIS — H2511 Age-related nuclear cataract, right eye: Secondary | ICD-10-CM | POA: Diagnosis not present

## 2021-06-21 DIAGNOSIS — H25041 Posterior subcapsular polar age-related cataract, right eye: Secondary | ICD-10-CM | POA: Diagnosis not present

## 2021-07-18 ENCOUNTER — Telehealth: Payer: PPO

## 2021-07-18 ENCOUNTER — Ambulatory Visit (INDEPENDENT_AMBULATORY_CARE_PROVIDER_SITE_OTHER): Payer: PPO

## 2021-07-18 DIAGNOSIS — E785 Hyperlipidemia, unspecified: Secondary | ICD-10-CM

## 2021-07-18 DIAGNOSIS — R7303 Prediabetes: Secondary | ICD-10-CM

## 2021-07-18 DIAGNOSIS — I10 Essential (primary) hypertension: Secondary | ICD-10-CM

## 2021-07-18 NOTE — Progress Notes (Signed)
Chronic Care Management Pharmacy Note  07/18/2021 Name:  Clarence Ware MRN:  [id number] DOB:  1950/12/04  Summary: -Patient reports that since adjusting losartan dose BP has been controlled, averaging 120-130/80's - denies any issues or AE since restarting medication -Latest LDL at goal -Has not had to use clonazepam in >12 months - using melatonin to help with sleep   Recommendations/Changes made from today's visit: -Recommending no changes to medications, advised for patient to continue monitoring blood pressure at least once weekly, to reach out should BP control be lost  -Recommended for patient to schedule annual physical as he is due   Plan: -f/u in 6 months   Subjective: Clarence Ware is an 71 y.o. year old male who is a primary patient of [name], MD.  The CCM team was consulted for assistance with disease management and care coordination needs.    Engaged with patient by telephone for follow up visit in response to provider referral for pharmacy case management and/or care coordination services.   Consent to Services:  The patient was given the following information about Chronic Care Management services today, agreed to services, and gave verbal consent: 1. CCM service includes personalized support from designated clinical staff supervised by the primary care provider, including individualized plan of care and coordination with other care providers 2. 24/7 contact phone numbers for assistance for urgent and routine care needs. 3. Service will only be billed when office clinical staff spend 20 minutes or more in a month to coordinate care. 4. Only one practitioner may furnish and bill the service in a calendar month. 5.The patient may stop CCM services at any time (effective at the end of the month) by phone call to the office staff. 6. The patient will be responsible for cost sharing (co-pay) of up to 20% of the service fee (after annual deductible is met). Patient  agreed to services and consent obtained.  Patient Care Team: [name], MD as PCP - General (Internal Medicine) Troy Sine, MD as PCP - Cardiology (Cardiology) Franchot Gallo, MD as Consulting Physician (Urology) Katy Apo, MD as Consulting Physician (Ophthalmology) Irene Shipper, MD as Consulting Physician (Gastroenterology) Charlton Haws, [facility] as Pharmacist (Pharmacist)  Recent office visits: N/a  Recent consult visits: 03/16/2021 - Dr. Brett Fairy - Neurology - no changes to medications  03/09/2021 - Dr. Claiborne Billings - Cardiology - restart losartan 12.84m daily - follow up in 6 months   Hospital visits: None in previous 6 months  Objective:  Lab Results  Component Value Date   CREATININE 1.02 08/05/2020   BUN 20 08/05/2020   GFR 74.68 08/05/2020   GFRNONAA 86 03/02/2020   GFRAA 99 03/02/2020   NA 140 08/05/2020   K 4.4 08/05/2020   CALCIUM 9.9 08/05/2020   CO2 32 08/05/2020   GLUCOSE 90 08/05/2020    Lab Results  Component Value Date/Time   HGBA1C 6.2 08/05/2020 10:38 AM   HGBA1C 5.8 (H) 03/02/2020 08:56 AM   GFR 74.68 08/05/2020 10:38 AM   GFR 77.65 07/22/2019 08:18 AM    Last diabetic Eye exam:  No results found for: HMDIABEYEEXA  Last diabetic Foot exam:  No results found for: HMDIABFOOTEX   Lab Results  Component Value Date   CHOL 118 08/05/2020   HDL 48.50 08/05/2020   LDLCALC 59 08/05/2020   TRIG 50.0 08/05/2020   CHOLHDL 2 08/05/2020    Hepatic Function Latest Ref Rng & Units 07/22/2019 07/22/2018 10/24/2017  Total Protein 6.0 -  8.3 g/dL 7.0 6.8 6.8  Albumin 3.5 - 5.2 g/dL 4.3 4.3 4.5  AST 0 - 37 U/L _0 ALT 0 - 53 U/L 34 34 28  Alk Phosphatase 39 - 117 U/L 84 77 91  Total Bilirubin 0.2 - 1.2 mg/dL 0.7 0.9 0.5  Bilirubin, Direct 0.0 - 0.3 mg/dL 0.2 - -    Lab Results  Component Value Date/Time   TSH 1.77 07/22/2019 08:18 AM   TSH 1.87 07/22/2018 08:59 AM    CBC Latest Ref Rng & Units 08/05/2020 07/22/2019 07/22/2018  WBC  4.0 - 10.5 K/uL 5.1 5.3 6.3  Hemoglobin 13.0 - 17.0 g/dL 14.1 14.5 14.7  Hematocrit 39.0 - 52.0 % 41.2 43.0 43.3  Platelets 150.0 - 400.0 K/uL 175.0 194.0 209.0    Lab Results  Component Value Date/Time   VD25OH 67.26 08/05/2020 10:38 AM   VD25OH 24 (L) 10/13/2014 04:07 PM   VD25OH 52 12/16/2013 04:39 PM    Clinical ASCVD: No  The ASCVD Risk score (Arnett DK, et al., 2019) failed to calculate for the following reasons:   The systolic blood pressure is missing   The valid total cholesterol range is 130 to 320 mg/dL    Depression screen [facility] 2/9 06/06/2021 06/06/2021 03/19/2020  Decreased Interest 0 0 0  Down, Depressed, Hopeless 0 0 0  PHQ - 2 Score 0 0 0  Altered sleeping - - -  Tired, decreased energy - - -  Change in appetite - - -  Feeling bad or failure about yourself  - - -  Trouble concentrating - - -  Moving slowly or fidgety/restless - - -  Suicidal thoughts - - -  PHQ-9 Score - - -  Difficult doing work/chores - - -    Social History   Tobacco Use  Smoking Status Former   Packs/day: 0.25   Years: 1.00   Pack years: 0.25   Types: Cigarettes   Quit date: 07/10/1992   Years since quitting: 29.0  Smokeless Tobacco Never   BP Readings from Last 3 Encounters:  03/16/21 (!) 168/95  03/09/21 (!) 162/90  08/05/20 128/84   Pulse Readings from Last 3 Encounters:  03/16/21 (!) 56  03/09/21 64  08/05/20 66   Wt Readings from Last 3 Encounters:  03/16/21 196 lb 8 oz (89.1 kg)  03/09/21 193 lb 9.6 oz (87.8 kg)  08/05/20 197 lb (89.4 kg)   BMI Readings from Last 3 Encounters:  03/16/21 25.23 kg/m  03/09/21 24.86 kg/m  08/05/20 25.29 kg/m    Assessment/Interventions: Review of patient past medical history, allergies, medications, health status, including review of consultants reports, laboratory and other test data, was performed as part of comprehensive evaluation and provision of chronic care management services.   SDOH:  (Social Determinants of Health)  assessments and interventions performed: Yes  SDOH Screenings   Alcohol Screen: Low Risk    Last Alcohol Screening Score (AUDIT): 0  Depression (PHQ2-9): Low Risk    PHQ-2 Score: 0  Financial Resource Strain: Low Risk    Difficulty of Paying Living Expenses: Not hard at all  Food Insecurity: No Food Insecurity   Worried About Charity fundraiser in the Last Year: Never true   Ran Out of Food in the Last Year: Never true  Housing: Low Risk    Last Housing Risk Score: 0  Physical Activity: Sufficiently Active   Days of Exercise per Week: 7 days   Minutes of Exercise per Session: 30  min  Social Connections: Moderately Integrated   Frequency of Communication with Friends and Family: Twice a week   Frequency of Social Gatherings with Friends and Family: Twice a week   Attends Religious Services: Never   Marine scientist or Organizations: Yes   Attends Music therapist: More than 4 times per year   Marital Status: Married  Stress: No Stress Concern Present   Feeling of Stress : Not at all  Tobacco Use: Medium Risk   Smoking Tobacco Use: Former   Smokeless Tobacco Use: Never   Passive Exposure: Not on Pensions consultant Needs: No Transportation Needs   Lack of Transportation (Medical): No   Lack of Transportation (Non-Medical): No    CCM Care Plan  Allergies  Allergen Reactions   Nexium [Esomeprazole Magnesium]     Awful regurgitation    Medications Reviewed Today     Reviewed by Tomasa Blase, [facility] (Pharmacist) on 07/18/21 at 1334  Med List Status: <None>   Medication Order Taking? Sig Documenting Provider Last Dose Status Informant  aspirin EC 81 MG tablet [id number] Yes Take 81 mg by mouth daily. [provider] Taking Active   atorvastatin (LIPITOR) 40 MG tablet [id number] Yes Take 1 tablet (40 mg total) by mouth daily. Troy Sine, MD Taking Active   cetirizine (ZYRTEC) 10 MG tablet [id number] Yes Take 10 mg by mouth as directed. PRN  [provider] Taking Active   Cholecalciferol (VITAMIN D) 2000 units CAPS [id number] Yes Take 1 capsule by mouth daily. [provider] Taking Active   clonazePAM (KLONOPIN) 0.5 MG tablet [id number] No Take 1 tablet (0.5 mg total) by mouth at bedtime as needed for anxiety.  Patient not taking: Reported on 07/18/2021   Dohmeier, Asencion Partridge, MD Not Taking Active   ezetimibe (ZETIA) 10 MG tablet [id number] Yes TAKE ONE TABLET BY MOUTH ONCE DAILY Troy Sine, MD Taking Active   losartan (COZAAR) 25 MG tablet [id number]  Take 1 tablet (25 mg total) by mouth daily. [name], MD  Expired 07/10/21 2359   melatonin 3 MG TABS tablet [id number] Yes Take 2 tablets (6 mg total) by mouth at bedtime.  Patient taking differently: Take 3 mg by mouth at bedtime.   [name], MD Taking Active             Patient Active Problem List   Diagnosis Date Noted   Acute bronchitis due to COVID-19 virus 12/08/2020   Age-related cognitive decline 08/05/2020   Chronic cluster headache, not intractable 03/16/2020   Uncontrolled REM sleep behavior disorder 03/16/2020   Benign prostatic hyperplasia without lower urinary tract symptoms 01/18/2017   Routine general medical examination at a health care facility 01/18/2017   Essential hypertension 12/18/2014   Hyperlipidemia with target LDL less than 100 12/18/2014   First degree heart block 03/30/2013   Prediabetes 06/30/2010   GERD 06/26/2008   ERECTILE DYSFUNCTION, MILD 01/30/2008   VENTRICULAR HYPERTROPHY, LEFT 11/29/2006   COLON POLYP 09/06/2006   ASTHMA, EXERCISE INDUCED 09/06/2006    Immunization History  Administered Date(s) Administered   Hepatitis B 06/09/2013   Influenza, High Dose Seasonal PF 05/02/2019   Influenza-Unspecified 04/09/2014, 04/27/2018, 05/02/2019, 05/06/2021   Moderna SARS-COV2 Booster Vaccination 05/06/2020, 11/07/2020   Moderna Sars-Covid-2 Vaccination 07/08/2019, 08/05/2019   PFIZER(Purple Top)SARS-COV-2  Vaccination 03/29/2021   Pneumococcal Conjugate-13 01/13/2016   Pneumococcal Polysaccharide-23 01/18/2017   Tdap 10/03/2011, 02/23/2020   Zoster Recombinat (Shingrix) 03/02/2018, 05/19/2018   Zoster, Live 06/09/2013  Conditions to be addressed/monitored:  Hypertension, Hyperlipidemia, and Prediabetes  Care Plan : [place]  Updates made by Tomasa Blase, RPH since 07/18/2021 12:00 AM     Problem: HTN, HLD, Prediabetes   Priority: High  Onset Date: 07/18/2021     Long-Range Goal: Disease Management   Start Date: 07/18/2021  Expected End Date: 07/18/2022  This Visit's Progress: On track  Priority: High  Note:   Current Barriers:  Chronic Disease Management support, education, and care coordination needs related to Hypertension and Hyperlipidemia, Prediabetes   Hypertension Controlled - improved and at goal since restarting losartan - averaging 120-130/80's  BP Readings from Last 3 Encounters:  03/16/21 (!) 168/95  03/09/21 (!) 162/90  08/05/20 128/84  Pharmacist Clinical Goal(s): Patient will work with PharmD and providers to maintain BP goal <130/80 Current regimen:  Losartan 25 mg daily Interventions: Discussed BP goals and benefits of medications for prevention of heart attack / stroke Patient self care activities - Patient will: Check BP as needed, document, and provide at future appointments Ensure daily salt intake < 2300 mg/day  Hyperlipidemia Controlled  Lab Results  Component Value Date   [facility] 59 08/05/2020  Pharmacist Clinical Goal(s): Patient will work with PharmD and providers to maintain LDL goal < 100 Current regimen:  Atorvastatin 40 mg daily Ezetimibe 10 mg daily Aspirin 81 mg daily Interventions: Discussed cholesterol goals and benefits of medications for prevention of heart attack / stroke Patient self care activities - Patient will: Continue current medications  Prediabetes Controlled  Lab Results  Component Value Date    HGBA1C 6.2 08/05/2020  Pharmacist Clinical Goal(s): Patient will work with PharmD and providers to maintain A1c goal <6.5% Current regimen:  No medications Interventions: Discussed importance of maintaining sugars at goal to prevent complications of diabetes including kidney damage, retinal damage, and cardiovascular disease Patient self care activities -Patient will: Continue with diet control  Medication management Pharmacist Clinical Goal(s): Patient will work with PharmD and providers to achieve optimal medication adherence [facility] Interventions Comprehensive medication review performed. Utilize [facility] for medication synchronization, packaging and delivery Patient self care activities - Patient will: Focus on medication adherence by fill date Take medications as prescribed Report any questions or concerns to PharmD and/or provider(s)       Medication Assistance: None required.  Patient affirms current coverage meets needs.  Care Gaps: N/a  Patient's preferred pharmacy is:  [facility] [id number] [place], [place] [address] Phone: [phone] Fax: [phone]  [facility], [place], [place], [place] [address] Phone: [phone] Fax: [phone]  [facility] - [place], [place] - [address] [place] [address] Phone: [phone] Fax: [phone]  [place] [place] [address] Phone: [phone] Fax: [phone]   Uses pill box? No - able to manage without  Pt endorses 100% compliance  Care Plan and Follow Up Patient Decision:  Patient agrees to Care Plan and Follow-up.  Plan: Telephone follow up appointment with care management team member scheduled for:  6 months  The patient has  been provided with contact information for the care management team and has been advised to call with any health related questions or concerns.   Tomasa Blase, PharmD Clinical Pharmacist, [place]

## 2021-07-18 NOTE — Patient Instructions (Signed)
Visit Information  Following are the goals we discussed today:   Manage My Medicine  Timeframe:  Long-Range Goal Priority:  Medium Start Date: 07/18/2021                           Expected End Date:  07/18/2022                    Follow Up Date 01/16/2022   - call for medicine refill 2 or 3 days before it runs out - keep a list of all the medicines I take; vitamins and herbals too - learn to read medicine labels    Why is this important?   These steps will help you keep on track with your medicines.  Plan: Telephone follow up appointment with care management team member scheduled for:  6 months  The patient has been provided with contact information for the care management team and has been advised to call with any health related questions or concerns.   Tomasa Blase, PharmD Clinical Pharmacist, [facility]   Please call the care guide team at [phone] if you need to cancel or reschedule your appointment.   Patient verbalizes understanding of instructions provided today and agrees to view in [place].

## 2021-07-22 ENCOUNTER — Other Ambulatory Visit: Payer: Self-pay | Admitting: Internal Medicine

## 2021-07-22 DIAGNOSIS — J208 Acute bronchitis due to other specified organisms: Secondary | ICD-10-CM

## 2021-07-22 MED ORDER — NIRMATRELVIR/RITONAVIR (PAXLOVID)TABLET
3.0000 | ORAL_TABLET | Freq: Two times a day (BID) | ORAL | 0 refills | Status: AC
Start: 2021-07-22 — End: 2021-07-27

## 2021-08-01 ENCOUNTER — Other Ambulatory Visit: Payer: Self-pay | Admitting: Internal Medicine

## 2021-08-01 DIAGNOSIS — I10 Essential (primary) hypertension: Secondary | ICD-10-CM

## 2021-08-09 DIAGNOSIS — I10 Essential (primary) hypertension: Secondary | ICD-10-CM | POA: Diagnosis not present

## 2021-08-09 DIAGNOSIS — E785 Hyperlipidemia, unspecified: Secondary | ICD-10-CM | POA: Diagnosis not present

## 2021-09-01 ENCOUNTER — Other Ambulatory Visit: Payer: Self-pay

## 2021-09-01 ENCOUNTER — Ambulatory Visit (INDEPENDENT_AMBULATORY_CARE_PROVIDER_SITE_OTHER): Payer: PPO | Admitting: Internal Medicine

## 2021-09-01 ENCOUNTER — Encounter: Payer: Self-pay | Admitting: Internal Medicine

## 2021-09-01 VITALS — BP 142/84 | HR 60 | Temp 98.3°F | Resp 16 | Ht 74.0 in | Wt 195.0 lb

## 2021-09-01 DIAGNOSIS — N138 Other obstructive and reflux uropathy: Secondary | ICD-10-CM | POA: Diagnosis not present

## 2021-09-01 DIAGNOSIS — L821 Other seborrheic keratosis: Secondary | ICD-10-CM | POA: Diagnosis not present

## 2021-09-01 DIAGNOSIS — I10 Essential (primary) hypertension: Secondary | ICD-10-CM | POA: Diagnosis not present

## 2021-09-01 DIAGNOSIS — M654 Radial styloid tenosynovitis [de Quervain]: Secondary | ICD-10-CM | POA: Insufficient documentation

## 2021-09-01 DIAGNOSIS — R7303 Prediabetes: Secondary | ICD-10-CM | POA: Diagnosis not present

## 2021-09-01 DIAGNOSIS — Z0000 Encounter for general adult medical examination without abnormal findings: Secondary | ICD-10-CM

## 2021-09-01 DIAGNOSIS — N401 Enlarged prostate with lower urinary tract symptoms: Secondary | ICD-10-CM | POA: Diagnosis not present

## 2021-09-01 DIAGNOSIS — E785 Hyperlipidemia, unspecified: Secondary | ICD-10-CM | POA: Diagnosis not present

## 2021-09-01 DIAGNOSIS — G3281 Cerebellar ataxia in diseases classified elsewhere: Secondary | ICD-10-CM | POA: Diagnosis not present

## 2021-09-01 LAB — CBC WITH DIFFERENTIAL/PLATELET
Basophils Absolute: 0 10*3/uL (ref 0.0–0.1)
Basophils Relative: 0.5 % (ref 0.0–3.0)
Eosinophils Absolute: 0.4 10*3/uL (ref 0.0–0.7)
Eosinophils Relative: 7.3 % — ABNORMAL HIGH (ref 0.0–5.0)
HCT: 40.1 % (ref 39.0–52.0)
Hemoglobin: 13.5 g/dL (ref 13.0–17.0)
Lymphocytes Relative: 20.2 % (ref 12.0–46.0)
Lymphs Abs: 1.1 10*3/uL (ref 0.7–4.0)
MCHC: 33.8 g/dL (ref 30.0–36.0)
MCV: 90.5 fl (ref 78.0–100.0)
Monocytes Absolute: 0.7 10*3/uL (ref 0.1–1.0)
Monocytes Relative: 12.3 % — ABNORMAL HIGH (ref 3.0–12.0)
Neutro Abs: 3.4 10*3/uL (ref 1.4–7.7)
Neutrophils Relative %: 59.7 % (ref 43.0–77.0)
Platelets: 178 10*3/uL (ref 150.0–400.0)
RBC: 4.43 Mil/uL (ref 4.22–5.81)
RDW: 13 % (ref 11.5–15.5)
WBC: 5.6 10*3/uL (ref 4.0–10.5)

## 2021-09-01 LAB — LIPID PANEL
Cholesterol: 123 mg/dL (ref 0–200)
HDL: 48.1 mg/dL (ref >39.00)
LDL Cholesterol: 66 mg/dL (ref 0–99)
NonHDL: 74.73
Total CHOL/HDL Ratio: 3
Triglycerides: 43 mg/dL (ref 0.0–149.0)
VLDL: 8.6 mg/dL (ref 0.0–40.0)

## 2021-09-01 LAB — HEMOGLOBIN A1C: Hgb A1c MFr Bld: 6.2 % (ref 4.6–6.5)

## 2021-09-01 LAB — BASIC METABOLIC PANEL
BUN: 17 mg/dL (ref 6–23)
CO2: 31 mEq/L (ref 19–32)
Calcium: 9.5 mg/dL (ref 8.4–10.5)
Chloride: 104 mEq/L (ref 96–112)
Creatinine, Ser: 1.01 mg/dL (ref 0.40–1.50)
GFR: 75 mL/min (ref >60.00)
Glucose, Bld: 102 mg/dL — ABNORMAL HIGH (ref 70–99)
Potassium: 4.9 mEq/L (ref 3.5–5.1)
Sodium: 139 mEq/L (ref 135–145)

## 2021-09-01 LAB — URINALYSIS, ROUTINE W REFLEX MICROSCOPIC
Bilirubin Urine: NEGATIVE
Hgb urine dipstick: NEGATIVE
Ketones, ur: NEGATIVE
Leukocytes,Ua: NEGATIVE
Nitrite: NEGATIVE
RBC / HPF: NONE SEEN (ref 0–?)
Specific Gravity, Urine: 1.01 (ref 1.000–1.030)
Total Protein, Urine: NEGATIVE
Urine Glucose: NEGATIVE
Urobilinogen, UA: 0.2 (ref 0.0–1.0)
WBC, UA: NONE SEEN (ref 0–?)
pH: 6 (ref 5.0–8.0)

## 2021-09-01 LAB — HEPATIC FUNCTION PANEL
ALT: 32 U/L (ref 0–53)
AST: 29 U/L (ref 0–37)
Albumin: 4.3 g/dL (ref 3.5–5.2)
Alkaline Phosphatase: 85 U/L (ref 39–117)
Bilirubin, Direct: 0.2 mg/dL (ref 0.0–0.3)
Total Bilirubin: 0.8 mg/dL (ref 0.2–1.2)
Total Protein: 6.5 g/dL (ref 6.0–8.3)

## 2021-09-01 LAB — TSH: TSH: 1.48 u[IU]/mL (ref 0.35–5.50)

## 2021-09-01 MED ORDER — ALFUZOSIN HCL ER 10 MG PO TB24
10.0000 mg | ORAL_TABLET | Freq: Every day | ORAL | 1 refills | Status: DC
Start: 2021-09-01 — End: 2021-09-21

## 2021-09-01 MED ORDER — OLMESARTAN MEDOXOMIL 40 MG PO TABS: 40.0000 mg | ORAL_TABLET | Freq: Every day | ORAL | 1 refills | Status: DC

## 2021-09-01 NOTE — Patient Instructions (Signed)

## 2021-09-01 NOTE — Progress Notes (Signed)
Subjective:  Patient ID: Clarence Ware, male    DOB: 04-17-1951  Age: 71 y.o. MRN: [id number]  CC: Annual Exam, Hypertension, Hyperlipidemia, and Gastroesophageal Reflux  This visit occurred during the SARS-CoV-2 public health emergency.  Safety protocols were in place, including screening questions prior to the visit, additional usage of staff PPE, and extensive cleaning of exam room while observing appropriate contact time as indicated for disinfecting solutions.    HPI Clarence Ware presents for a CPX and f/up -   He is active and denies chest pain, shortness of breath, diaphoresis, dizziness, lightheadedness, or edema.  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  He would like to do some physical therapy.  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  He would like to see an occupational therapist to get a new splint.  Outpatient Medications Prior to Visit  Medication Sig Dispense Refill   aspirin EC 81 MG tablet Take 81 mg by mouth daily.     atorvastatin (LIPITOR) 40 MG tablet Take 1 tablet (40 mg total) by mouth daily. 90 tablet 3   cetirizine (ZYRTEC) 10 MG tablet Take 10 mg by mouth as directed. PRN     Cholecalciferol (VITAMIN D) 2000 units CAPS Take 1 capsule by mouth daily.     ezetimibe (ZETIA) 10 MG tablet TAKE ONE TABLET BY MOUTH ONCE DAILY 90 tablet 5   melatonin 3 MG TABS tablet Take 2 tablets (6 mg total) by mouth at bedtime. (Patient taking differently: Take 3 mg by mouth at bedtime.) 180 tablet 1   PFIZER COVID-19 VAC BIVALENT injection      losartan (COZAAR) 25 MG tablet TAKE ONE TABLET BY MOUTH ONCE DAILY 90 tablet 0   clonazePAM (KLONOPIN) 0.5 MG tablet Take 1 tablet (0.5 mg total) by mouth at bedtime as needed for anxiety. (Patient not taking: Reported on 07/18/2021) 5 tablet 0   No facility-administered medications prior to visit.    ROS Review of Systems  Constitutional: Negative.  Negative for fatigue.  HENT:  Negative.    Eyes: Negative.   Respiratory: Negative.  Negative for cough, chest tightness, shortness of breath and wheezing.   Cardiovascular:  Negative for chest pain, palpitations and leg swelling.  Gastrointestinal:  Negative for abdominal pain, constipation, diarrhea, nausea and vomiting.  Endocrine: Negative.   Genitourinary:  Positive for difficulty urinating. Negative for dysuria and hematuria.  Musculoskeletal:  Positive for arthralgias and gait problem. Negative for joint swelling and myalgias.  Skin:  Positive for color change.  Neurological:  Negative for dizziness, weakness, light-headedness and headaches.  Hematological:  Negative for adenopathy. Does not bruise/bleed easily.  Psychiatric/Behavioral: Negative.     Objective:  BP (!) 142/84 (BP Location: Left Arm, Patient Position: Sitting, Cuff Size: Large)    Pulse 60    Temp 98.3 F (36.8 C) (Oral)    Resp 16    Ht 6\' 2"  (1.88 m)    Wt 195 lb (88.5 kg)    SpO2 99%    BMI 25.04 kg/m   BP Readings from Last 3 Encounters:  09/01/21 (!) 142/84  03/16/21 (!) 168/95  03/09/21 (!) 162/90    Wt Readings from Last 3 Encounters:  09/01/21 195 lb (88.5 kg)  03/16/21 196 lb 8 oz (89.1 kg)  03/09/21 193 lb 9.6 oz (87.8 kg)    Physical Exam Vitals reviewed.  Constitutional:      Appearance: Normal appearance.  HENT:  Nose: Nose normal.     Mouth/Throat:     Mouth: Mucous membranes are moist.  Eyes:     General: No scleral icterus.    Conjunctiva/sclera: Conjunctivae normal.  Cardiovascular:     Rate and Rhythm: Normal rate and regular rhythm.     Heart sounds: No murmur heard. Pulmonary:     Effort: Pulmonary effort is normal.     Breath sounds: No stridor. No wheezing, rhonchi or rales.  Abdominal:     General: Abdomen is flat.     Palpations: There is no mass.     Tenderness: There is no abdominal tenderness. There is no guarding or rebound.     Hernia: No hernia is present.  Musculoskeletal:         General: Normal range of motion.     Cervical back: Neck supple.     Right lower leg: No edema.     Left lower leg: No edema.  Lymphadenopathy:     Cervical: No cervical adenopathy.  Skin:    Coloration: Skin is not pale.     Findings: Lesion present. No rash.     Comments: +SK's  Neurological:     General: No focal deficit present.     Mental Status: He is alert. Mental status is at baseline.  Psychiatric:        Mood and Affect: Mood normal.        Behavior: Behavior normal.    Lab Results  Component Value Date   WBC 5.6 09/01/2021   HGB 13.5 09/01/2021   HCT 40.1 09/01/2021   PLT 178.0 09/01/2021   GLUCOSE 102 (H) 09/01/2021   CHOL 123 09/01/2021   TRIG 43.0 09/01/2021   HDL 48.10 09/01/2021   LDLCALC 66 09/01/2021   ALT 32 09/01/2021   AST 29 09/01/2021   NA 139 09/01/2021   K 4.9 09/01/2021   CL 104 09/01/2021   CREATININE 1.01 09/01/2021   BUN 17 09/01/2021   CO2 31 09/01/2021   TSH 1.48 09/01/2021   PSA 1.7 11/10/2020   HGBA1C 6.2 09/01/2021    No results found.  Assessment & Plan:   Clarence Ware was seen today for annual exam, hypertension, hyperlipidemia and gastroesophageal reflux.  Diagnoses and all orders for this visit:  Essential hypertension- His blood pressure is not adequately well controlled.  Will upgrade to a more potent ARB. -     Basic metabolic panel; Future -     Urinalysis, Routine w reflex microscopic; Future -     CBC with Differential/Platelet; Future -     olmesartan (BENICAR) 40 MG tablet; Take 1 tablet (40 mg total) by mouth daily. -     CBC with Differential/Platelet -     Urinalysis, Routine w reflex microscopic -     Basic metabolic panel  Routine general medical examination at a health care facility- Exam completed, labs reviewed, vaccines reviewed and updated, cancer screenings are up-to-date, patient education was given.  Prediabetes- His A1C is 6.2% -     Basic metabolic panel; Future -     Hemoglobin A1c; Future -      Hemoglobin A1c -     Basic metabolic panel  Hyperlipidemia with target LDL less than 100- LDL goal achieved. Doing well on the statin  -     Lipid panel; Future -     TSH; Future -     Hepatic function panel; Future -     Hepatic function panel -  TSH -     Lipid panel  Cerebellar ataxia in diseases classified elsewhere [facility]) -     Ambulatory referral to Physical Therapy  De Quervain's tenosynovitis, right -     Ambulatory referral to Occupational Therapy  Seborrheic keratosis -     Ambulatory referral to Dermatology  BPH with urinary obstruction -     alfuzosin (UROXATRAL) 10 MG 24 hr tablet; Take 1 tablet (10 mg total) by mouth daily with breakfast.   I have discontinued Clarence Ware "[name]"'s clonazePAM and losartan. I am also having him start on olmesartan and alfuzosin. Additionally, I am having him maintain his cetirizine, Vitamin D, aspirin EC, melatonin, atorvastatin, ezetimibe, and Pfizer COVID-19 Vac Bivalent.  Meds ordered this encounter  Medications   olmesartan (BENICAR) 40 MG tablet    Sig: Take 1 tablet (40 mg total) by mouth daily.    Dispense:  90 tablet    Refill:  1   alfuzosin (UROXATRAL) 10 MG 24 hr tablet    Sig: Take 1 tablet (10 mg total) by mouth daily with breakfast.    Dispense:  90 tablet    Refill:  1     Follow-up: Return in about 6 months (around 03/01/2022).  [name], MD

## 2021-09-08 ENCOUNTER — Ambulatory Visit: Payer: PPO | Attending: Internal Medicine | Admitting: Occupational Therapy

## 2021-09-08 ENCOUNTER — Other Ambulatory Visit: Payer: Self-pay

## 2021-09-08 DIAGNOSIS — R279 Unspecified lack of coordination: Secondary | ICD-10-CM | POA: Insufficient documentation

## 2021-09-08 DIAGNOSIS — R2689 Other abnormalities of gait and mobility: Secondary | ICD-10-CM | POA: Insufficient documentation

## 2021-09-08 DIAGNOSIS — M25541 Pain in joints of right hand: Secondary | ICD-10-CM | POA: Insufficient documentation

## 2021-09-08 NOTE — Therapy (Signed)
[facility] ?[place] ?[place], [place], [address] ?Phone: [phone]   Fax:  [phone] ? ?Patient Details  ?Name: Clarence Ware ?MRN: [id number] ?Date of Birth: [birth year] ?Referring Provider:  [name], MD ? ?Encounter Date: 09/08/2021 ? ?Pt arrived and reported he only needed new straps and hooks to his previously issued thumb spica splint. Therapist cleaned splint and issued new straps and hooks to current splint. Pt reports he needs no further therapy. No charge for this visit.  ? ?Carey Bullocks, OTR/L ?09/08/2021, 9:07 AM ? ?[place] ?[place] ?[place], [place], [address] ?Phone: [phone]   Fax:  [phone] ?

## 2021-09-19 ENCOUNTER — Encounter: Payer: Self-pay | Admitting: Cardiovascular Disease

## 2021-09-19 ENCOUNTER — Ambulatory Visit: Payer: PPO | Admitting: Cardiovascular Disease

## 2021-09-19 ENCOUNTER — Other Ambulatory Visit: Payer: Self-pay

## 2021-09-19 VITALS — BP 124/70 | HR 64 | Ht 74.5 in | Wt 197.0 lb

## 2021-09-19 DIAGNOSIS — G4752 REM sleep behavior disorder: Secondary | ICD-10-CM | POA: Diagnosis not present

## 2021-09-19 DIAGNOSIS — I10 Essential (primary) hypertension: Secondary | ICD-10-CM

## 2021-09-19 DIAGNOSIS — E785 Hyperlipidemia, unspecified: Secondary | ICD-10-CM

## 2021-09-19 DIAGNOSIS — I440 Atrioventricular block, first degree: Secondary | ICD-10-CM

## 2021-09-19 NOTE — Patient Instructions (Signed)

## 2021-09-19 NOTE — Progress Notes (Unsigned)
Patient ID: Clarence Ware, male   DOB: [birth year], 71 y.o.   MRN: [id number]    Primary M.D.: Dr. Ivar Bury  HPI: Clarence Ware is a 71 y.o. male presents to the office today for a 7 month followup cardiology evaluation.  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  Recently, another brother has a chronic aortic dissection for which she's been treated medically.  Clarence Ware has a history of hyperlipidemia and hypertension.  Over 10 years ago he had a normal routine treadmill test. The patient has continued to exercise regularly. There is a prior history of substance abuse for which he did undergo treatment.  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  He  has a history of hyperlipidemia, erectile dysfunction, a history of intermittent headaches.  When I saw him on 11/07/2012, we discussed the sensitivity and specificity of routine treadmill testing with a strong family history I elected to schedule him for a cardiopulmonary met test for further evaluation would also be helpful to assess endothelial dysfunction/diastolic dysfunction evaluate potential for microvascular angina. He also had a soft cardiac murmur at his apex and recommended a 2-D echo Doppler study.  His cardiopulmonary met test revealed that he had good functional capacity with an excellent maximum oxygen consumption peak of 92% of predicted. However, he had a abnormal response during the last several minutes of exercise suggesting ischemic myocardial dysfunction with decreasing stroke volume with increasing work rate which corresponded to an abrupt decrease in cardiac output suggestive of exercise-induced myocardial dysfunction. He did have very mild ventilation/perfusion mismatch  with reference to his pulmonary status.  A 2-D echo Doppler study showed normal systolic function with an ejection fraction of 55-60%. He had normal diastolic parameters.  When I saw him in followup of his noninvasive studies in June 2014 due to the mild abnormality noted during the last 7 minutes of exercise I recommended the addition of ARB therapy and further titrated his atorvastatin for aggressive lipid management. He has tolerated initiation of losartan.  I last saw him on most 2 years ago.  He denied any  chest pain or palpitations. He He did have a followup NMR lipoprotein of which showed an LDL particle number of 1216 with a calculated LDL of 86, HDL 46, triglycerides 58, total cholesterol 144. He had 552 small LDL some particles. HDL particle number was reduced at 29.1. Insulin resistance score was normal at 33.  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  However, he is still walking at least 3 days per week for a minimum of 30 minutes.  He underwent a sleep study at [facility] on 05/03/201 and was not found to have significant obstructive sleep apnea and his AHI and RDI were both 2.9.  He has significant periodic limb movements of sleep and had snoring.  He tells me he had a mouth piece customized by Dr. Oneal Grout for treatment of his snoring.   I saw him in January 2019.  He denies any episodes of chest pain or shortness of breath.  He is unaware of palpitations.  He uses his mouthguard to sleep  and denies any awareness of breakthrough snoring.  He continues to be on losartan 25 mg daily, and metoprolol succinate 25 mg daily for hypertension.  He has continued to be on atorvastatin 20 mg.  Of note, laboratory 6 months ago showed an LDL cholesterol, which had risen up to 90.  Diet evaluation I further titrated atorvastatin to 40 mg.  Over the last 6 months he has continued to remain stable.  He  underwent repeat laboratory on his increased atorvastatin which had not significantly changed.  Total cholesterol was 150, triglycerides 63, HDL 48, LDL 89.  He denies chest pain PND orthopnea.  He denies shortness of breath.  He has noticed some rare dizziness particularly when he bends over.    He was  evaluated in a telemedicine visit on October 11, 2018.  Prior to his evaluation in June 2019 he had self reduced his metoprolol succinate from 25 mg down to 12.5 mg due to noticing that his heart rate was morning somewhat slow.  He started to notice that particularly following exertion his blood pressure at times have gotten low and he has recorded blood pressure levels of 114/71 with a pulse of 67, 106/68 with a pulse of 65, 95/60 with a pulse of 63 and on 1 occasion systolic blood pressure had dropped to as low as 88.  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  He denied any episodes of chest pain.  He was taking losartan 25 mg daily in addition to Toprol-XL 12.5 mg daily and was on atorvastatin 40 mg and Zetia 10 mg for hyperlipidemia.  In January 2020 LDL cholesterol was 70.  He continues to take aspirin.  He admits to some fatigue associated with bradycardia.  He was using a customized oral appliance for his mild sleep apnea and followed by Dr. Oneal Grout for his snoring/sleep apnea.  I saw him in June 2021 and since his previous evaluation he was having significant dreaming and falling out of bed diving due to potential REM related sleep disorder.  He ultimately was referred to Dr. Asencion Partridge Dohmeier and apparently underwent an MRI  for assessment.  He is aware of the association of potential future Parkinson disease or Lewy body disorder associated with REM behavior parasomnias.  He does not know the results of his MRI imaging.   He denies chest pain or palpitations.  Sometimes if he stands abruptly he may note mild dizzy spells but this has improved with discontinuance of atenolol and switching to carvedilol.  I last saw him on August 04, 2020 at which time he remained stable and was trying to slow down and has been working only approximately 7 days/month.    He has not had any further significant night terrors he is on clonazepam in addition to melatonin 6 mg at bedtime.  20 pounds over the past year.  He has continued to be on losartan 25 mg.  He is on atorvastatin 40 mg and Zetia 10 mg for hyperlipidemia.  He sees Dr. [name] who checks his laboratory.    Since I last saw him, he tells me he is involved in a Parkinson's disease study in light of his random sleep night terrors.  He is now fully retired.  He is using an oral appliance  for sleep apnea followed by Dr. Ron Parker.  He denies chest pain or shortness of breath.  He is now on melatonin for his night terror and has a prescription for clonazepam but has never taken this.  He continues to be on atorvastatin 40 mg and Zetia 10 mg for hyperlipidemia.  He presents for reevaluation.   Past Medical History:  Diagnosis Date   Allergy    First degree heart block    Hyperlipidemia    Hypertension    REM sleep behavior disorder    Substance abuse [facility])     Past Surgical History:  Procedure Laterality Date   INGUINAL HERNIA REPAIR Right 03/10/2015   Procedure: OPEN RIGHT INGUINAL HERNIA REPAIR;  Surgeon: Johnathan Hausen, MD;  Location: [facility];  Service: General;  Laterality: Right;  With MESH   VASECTOMY      Allergies  Allergen Reactions   Nexium [Esomeprazole Magnesium]     Awful regurgitation    Current Outpatient Medications  Medication Sig Dispense Refill   aspirin EC 81 MG tablet Take 81 mg by mouth daily.     atorvastatin (LIPITOR) 40 MG tablet Take 1 tablet (40 mg total) by mouth daily. 90 tablet 3   cetirizine (ZYRTEC) 10 MG tablet Take 10 mg by  mouth as directed. PRN     Cholecalciferol (VITAMIN D) 2000 units CAPS Take 1 capsule by mouth daily.     ezetimibe (ZETIA) 10 MG tablet TAKE ONE TABLET BY MOUTH ONCE DAILY 90 tablet 5   melatonin 3 MG TABS tablet Take 2 tablets (6 mg total) by mouth at bedtime. (Patient taking differently: Take 3 mg by mouth at bedtime.) 180 tablet 1   olmesartan (BENICAR) 40 MG tablet Take 1 tablet (40 mg total) by mouth daily. 90 tablet 1   PFIZER COVID-19 VAC BIVALENT injection      alfuzosin (UROXATRAL) 10 MG 24 hr tablet Take 1 tablet (10 mg total) by mouth daily with breakfast. (Patient not taking: Reported on 09/19/2021) 90 tablet 1   No current facility-administered medications for this visit.    Socially he is married has 2 children no grandchildren. He does exercise at least 30 minutes at a time. There is a prior history of tobacco use but he quit over 3 years ago and a prior use of opiates. 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  ROS General: Negative; No fevers, chills, or night sweats;  HEENT: Negative; No changes in vision or hearing, sinus congestion, difficulty swallowing Pulmonary: Negative; No cough, wheezing, shortness of breath, hemoptysis Cardiovascular: See history of present illness GI: Negative; No nausea, vomiting, diarrhea, or abdominal pain GU: Negative; No dysuria, hematuria, or difficulty voiding Musculoskeletal: Negative; no myalgias, joint pain, or weakness Hematologic/Oncology: Negative; no easy bruising, bleeding Endocrine: Negative; no heat/cold intolerance; no diabetes Neuro: Negative; no changes in balance, headaches Skin: Negative; No rashes or skin lesions Psychiatric: Negative; No behavioral problems, depression Sleep: Sleep study negative for OSA but suspicious for increased upper airway resistance.  He did have periodic limb movements that were frequent.  No hypnogognic hallucinations,  no cataplexy.  REM related sleep disorder. Other comprehensive 14 point system review is negative.  PE BP 124/70    Pulse 64    Ht 6' 2.5" (1.892 m)    Wt 197 lb (89.4 kg)    SpO2 99%    BMI 24.95 kg/m    Repeat blood pressure  by me was 140/86 supine and 135/82 standing  Wt Readings from Last 3 Encounters:  09/19/21 197 lb (89.4 kg)  09/01/21 195 lb (88.5 kg)  03/16/21 196 lb 8 oz (89.1 kg)     Physical Exam BP 124/70    Pulse 64    Ht 6' 2.5" (1.892 m)    Wt 197 lb (89.4 kg)    SpO2 99%    BMI 24.95 kg/m  General: Alert, oriented, no distress.  Skin: normal turgor, no rashes, warm and dry HEENT: Normocephalic, atraumatic. Pupils equal round and reactive to light; sclera anicteric; extraocular muscles intact; Fundi ** Nose without nasal septal hypertrophy Mouth/Parynx benign; Mallinpatti scale Neck: No JVD, no carotid bruits; normal carotid upstroke Lungs: clear to ausculatation and percussion; no wheezing or rales Chest wall: without tenderness to palpitation Heart: PMI not displaced, RRR, s1 s2 normal, 1/6 systolic murmur, no diastolic murmur, no rubs, gallops, thrills, or heaves Abdomen: soft, nontender; no hepatosplenomehaly, BS+; abdominal aorta nontender and not dilated by palpation. Back: no CVA tenderness Pulses 2+ Musculoskeletal: full range of motion, normal strength, no joint deformities Extremities: no clubbing cyanosis or edema, Homan's sign negative  Neurologic: grossly nonfocal; Cranial nerves grossly wnl Psychologic: Normal mood and affect    General: Alert, oriented, no distress.  Skin: normal turgor, no rashes, warm and dry HEENT: Normocephalic, atraumatic. Pupils equal round and reactive to light; sclera anicteric; extraocular muscles intact;  Nose without nasal septal hypertrophy Mouth/Parynx benign; Mallinpatti scale 3 Neck: No JVD, no carotid bruits; normal carotid upstroke Lungs: clear to ausculatation and percussion; no wheezing or rales Chest wall:  without tenderness to palpitation Heart: PMI not displaced, RRR, s1 s2 normal, 1/6 systolic murmur, no diastolic murmur, no rubs, gallops, thrills, or heaves Abdomen: soft, nontender; no hepatosplenomehaly, BS+; abdominal aorta nontender and not dilated by palpation. Back: no CVA tenderness Pulses 2+ Musculoskeletal: full range of motion, normal strength, no joint deformities Extremities: no clubbing cyanosis or edema, Homan's sign negative  Neurologic: grossly nonfocal; Cranial nerves grossly wnl Psychologic: Normal mood and affect  September 19, 2021 ECG (independently read by me): NSR at 64, 1st degree AV block; PR 240 msec, no ectopy  August 31,2022 ECG (independently read by me): Sinus rhythm at 64; 1st degree AV block    August 05, 2019 ECG (independently read by me): Sinus rhythm at 63, 1st degree AV bloick: PR 236  June 2021 ECG (independently read by me): Sinus rhythm at 64; 1st degree AV block, PR 232    msec; no ectopy  November 2020 ECG (independently read by me): Sinus bradycardia 55 bpm, first-degree AV block with a PR interval 238 ms.  QTc interval 380 ms.  No ectopy.  June 2019 ECG (independently read by me): Normal sinus rhythm at 62 bpm.  First-degree AV block with a PR interval at 240 ms.  No ectopy.  Normal intervals.   July 18, 2017 ECG (independently read by me): Normal sinus rhythm with an isolated PVC.  PR interval 202 ms.  Heart rate 63 bpm.  October 2017 ECG (independently read by me): Sinus bradycardia 57 bpm.  First-degree AV block with a PR interval at 228 ms.  No ST segment changes.  June 2016 ECG (independently read by me): Sinus rhythm with first-degree AV block.  PR interval 264 ms.  QTc interval normal 381 ms.  September 2014 ECG: Sinus bradycardia for the first degree AV block. PR interval  220 ms. QT interval 393 ms.  LABS: BMP  Latest Ref Rng & Units 09/01/2021 08/05/2020 03/02/2020  Glucose 70 - 99 mg/dL 102(H) 90 98  BUN 6 - 23 mg/dL $Remove'17 20 20   'HABPPaK$ Creatinine 0.40 - 1.50 mg/dL 1.01 1.02 0.91  BUN/Creat Ratio 6 - 22 (calc) - - NOT APPLICABLE  Sodium 767 - 145 mEq/L 139 140 139  Potassium 3.5 - 5.1 mEq/L 4.9 4.4 4.4  Chloride 96 - 112 mEq/L 104 105 104  CO2 19 - 32 mEq/L 31 32 25  Calcium 8.4 - 10.5 mg/dL 9.5 9.9 9.2   Hepatic Function Latest Ref Rng & Units 09/01/2021 07/22/2019 07/22/2018  Total Protein 6.0 - 8.3 g/dL 6.5 7.0 6.8  Albumin 3.5 - 5.2 g/dL 4.3 4.3 4.3  AST 0 - 37 U/L $Remo'29 31 26  'IKQIG$ ALT 0 - 53 U/L 32 34 34  Alk Phosphatase 39 - 117 U/L 85 84 77  Total Bilirubin 0.2 - 1.2 mg/dL 0.8 0.7 0.9  Bilirubin, Direct 0.0 - 0.3 mg/dL 0.2 0.2 -   CBC Latest Ref Rng & Units 09/01/2021 08/05/2020 07/22/2019  WBC 4.0 - 10.5 K/uL 5.6 5.1 5.3  Hemoglobin 13.0 - 17.0 g/dL 13.5 14.1 14.5  Hematocrit 39.0 - 52.0 % 40.1 41.2 43.0  Platelets 150.0 - 400.0 K/uL 178.0 175.0 194.0   Lab Results  Component Value Date   MCV 90.5 09/01/2021   MCV 89.6 08/05/2020   MCV 90.7 07/22/2019   Lab Results  Component Value Date   TSH 1.48 09/01/2021   Lab Results  Component Value Date   HGBA1C 6.2 09/01/2021     Lipid Panel     Component Value Date/Time   CHOL 123 09/01/2021 1140   CHOL 150 10/24/2017 0809   CHOL 144 03/03/2013 0855   TRIG 43.0 09/01/2021 1140   TRIG 58 03/03/2013 0855   HDL 48.10 09/01/2021 1140   HDL 48 10/24/2017 0809   HDL 46 03/03/2013 0855   CHOLHDL 3 09/01/2021 1140   VLDL 8.6 09/01/2021 1140   LDLCALC 66 09/01/2021 1140   LDLCALC 89 10/24/2017 0809   LDLCALC 86 03/03/2013 0855     RADIOLOGY: No results found.  IMPRESSION:  No diagnosis found.   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  He has had first-degree block.  In the past, he has had  some episodes of mild dizziness particularly if he bends over which has resulted in reduction in his beta-blocker regimen.  Remotely, his atenolol dose was switched to carvedilol low-dose which has been beneficial.  On subsequent evaluations, he continued to experience the dizziness and it was recommended that he ultimately wean and discontinue carvedilol.  His blood pressure today is elevated and on initial presentation was 162/90.  On repeat by me blood pressure was 140/86 supine 135/82 standing.  I have recommended he resume losartan at a reduced dose of 12.5 mg since he is no longer on this.  He is now participating in a Parkinson's study with his REM related sleep disorder.  He continues to be on Zetia and atorvastatin for hyperlipidemia.  LDL cholesterol in January 2022 was 59.  He is followed by Dr. [name] for primary care who checks laboratory.  He continues to use his oral appliance for sleep apnea followed by Dr.  Ron Parker.  I will see him in 6 months for reevaluation or sooner as needed.   Troy Sine, MD, [facility]  09/19/2021 8:41 AM

## 2021-09-20 NOTE — Therapy (Signed)
?OUTPATIENT PHYSICAL THERAPY  EVALUATION ? ? ?Patient Name: Clarence Ware ?MRN: [id number] ?DOB:05-May-1951, 71 y.o., male ?Today's Date: 09/21/2021 ? ? PT End of Session - 09/21/21 0752   ? ? Visit Number 1   ? Number of Visits 12   ? Date for PT Re-Evaluation 11/02/21   ? Authorization Type [employer]   ? PT Start Time 0750   ? PT Stop Time [phone]   ? PT Time Calculation (min) 53 min   ? Activity Tolerance Patient tolerated treatment well   ? Behavior During Therapy [facility] for tasks assessed/performed   ? ?  ?  ? ?  ? ? ?Past Medical History:  ?Diagnosis Date  ? Allergy   ? First degree heart block   ? Hyperlipidemia   ? Hypertension   ? REM sleep behavior disorder   ? Substance abuse ([place])   ? ?Past Surgical History:  ?Procedure Laterality Date  ? INGUINAL HERNIA REPAIR Right 03/10/2015  ? Procedure: OPEN RIGHT INGUINAL HERNIA REPAIR;  Surgeon: Johnathan Hausen, MD;  Location: [facility];  Service: General;  Laterality: Right;  With MESH  ? VASECTOMY    ? ?Patient Active Problem List  ? Diagnosis Date Noted  ? De Quervain's tenosynovitis, right 09/01/2021  ? Cerebellar ataxia in diseases classified elsewhere ([place]) 09/01/2021  ? Seborrheic keratosis 09/01/2021  ? BPH with urinary obstruction 09/01/2021  ? Age-related cognitive decline 08/05/2020  ? Chronic cluster headache, not intractable 03/16/2020  ? Uncontrolled REM sleep behavior disorder 03/16/2020  ? Routine general medical examination at a health care facility 01/18/2017  ? Essential hypertension 12/18/2014  ? Hyperlipidemia with target LDL less than 100 12/18/2014  ? First degree heart block 03/30/2013  ? Prediabetes 06/30/2010  ? GERD 06/26/2008  ? ERECTILE DYSFUNCTION, MILD 01/30/2008  ? VENTRICULAR HYPERTROPHY, LEFT 11/29/2006  ? COLON POLYP 09/06/2006  ? ASTHMA, EXERCISE INDUCED 09/06/2006  ? ? ?PCP: [name], MD ? ?REFERRING PROVIDER: [name], MD ? ?REFERRING DIAG: G32.81 (ICD-10-CM) - Cerebellar ataxia in diseases classified elsewhere  [facility])  ? ?THERAPY DIAG:  ?Unspecified lack of coordination ? ?Other abnormalities of gait and mobility ? ?ONSET DATE: ongoing, chronic (about 18 mos)  ? ?SUBJECTIVE:                                                                                                                                                                                          ? ?SUBJECTIVE STATEMENT: ?He noticed about 18 mos ago he was having sleep disturbances that became more intense, eventually significant periodic limb movements of sleep.  Once he launched out of bed and follow up  with Dr. Ronnald Ramp recommended he get an MRI.  Patient is in a Parkinson's study. He has had mild ataxia for several years and he thinks it may be increasing. He has not been diagnosed at this time with Parkinson's.  He is making good efforts to improve his health, including exercise- swims 2 x per week, walking.  "My balance is not what it was".  ?Pt lacks confidence with busy environments, incline. He reports some positional dizziness. Denies visual changes or falls.   ? ? ?PERTINENT HISTORY:  ?HTN, ataxia, DeQuervains ? ?PAIN:  ?Are you having pain? No ? ? ?PRECAUTIONS: None ? ?WEIGHT BEARING RESTRICTIONS No ? ?FALLS:  ?Has patient fallen in last 6 months? No, Number of falls: 0 "I don't think so" ? ?LIVING ENVIRONMENT: ?Lives with: lives with their family ?Lives in: House/apartment ?Stairs: Yes; External: 6? steps; on right going up ?Has following equipment at home: None ? ?OCCUPATION: Pharmacist ? ?PLOF: Independent, hobbies include sports, music, gardening and yardwork, travelling ? ?PATIENT GOALS Improve my balance  ? ? ?OBJECTIVE:  ? ?DIAGNOSTIC FINDINGS:  ?IMPRESSION:   ?1.  Couple punctate T2/FLAIR hyperintense foci in the subcortical or deep white matter of the hemispheres consistent with very minimal chronic microvascular ischemic change, typical for age. ?2.  Mild ethmoid, sphenoid and right frontal chronic sinusitis. ?3.  There were no acute findings  and there was a normal enhancement pattern. ? ? Will be having more scans in the coming weeks related to the study.  ? ?PATIENT SURVEYS:  ?FOTO TBA emailed to patient  ? ?COGNITION: ? Overall cognitive status: Within functional limits for tasks assessed   ?  ?SENSATION: ?WFL ? ?MUSCLE LENGTH: ?NT ? ?POSTURE:  ?Mild swayback posture, forward head  ? ?PALPATION: ?NT ? ?LUMBAR ROM:  ? ?Active  A/PROM  ?09/21/2021  ?Flexion WFL with bent knee   ?Extension Limited 50% mild dizziness in standing   ?Right lateral flexion   ?Left lateral flexion   ?Right rotation WNL  ?Left rotation WNL  ? (Blank rows = not tested) ?  ? ?LE MMT: ? ?MMT Right ?09/21/2021 Left ?09/21/2021  ?Hip flexion 4+/5 4+/5  ?Hip extension    ?Hip abduction    ?Hip adduction    ?Hip internal rotation    ?Hip external rotation    ?Knee flexion 4+/5 4+/5  ?Knee extension 5/5 5/5  ?Ankle dorsiflexion 5/5 5/5  ?Ankle plantarflexion    ?Ankle inversion    ?Ankle eversion    ? (Blank rows = not tested) ? ?WNL in UEs ?Grip strength Rt 68 lbs , Lt. 74 lbs  ? ?BERG BALANCE TEST ?Sitting to Standing: 4.      Stands without using hands and stabilize independently ?Standing Unsupported: 4.      Stands safely for 2 minutes ?Sitting Unsupported: 4.     Sits for 2 minutes independently ?Standing to Sitting: 4.     Sits safely with minimal use of hands ?Transfers: 4.     Transfers safely with minor use of hands ?Standing with eyes closed: 4.     Stands safely for 10 seconds  ?Standing with feet together: 4.     Stands for 1 minute safely ?Reaching forward with outstretched arm: 4.     Reaches forward 10 inches ?Retrieving object from the floor: 4.      Able to pick up easily and safely ?Turning to look behind: 4.     Looks behind from both sides and weight shifts well ?Turning  360 degrees: 4.     Able to turn in </=4 seconds  ?Place alternate foot on stool: 4.     Completes 8 steps in 20 seconds     ?Standing with one foot in front: 3.     Independent foot ahead for 30  seconds ?Standing on one foot: 2.     Holds >/=3 seconds ? ?Total Score: 53/56  ? ? ? ? ?FUNCTIONAL TESTS:  ?5 times sit to stand: 7.5 sec ?30 seconds chair stand test ?Timed up and go (TUG): 8 sec, able to do manual, cognitive tasks as well within normal limits   ?Berg Balance Scale: 53/56 ?Dynamic Gait Index: NT ?Functional gait assessment: 22/30 ? ?GAIT: ?Distance walked: 150 ?Assistive device utilized: None ?Level of assistance: independent ?Comments: no deviations with self selected gait speed  ? ? ?TODAY'S TREATMENT  ?See above for physical performance testing  ? ? ?PATIENT EDUCATION:  ?Education details: POC, balance impairments ?Person educated: Patient ?Education method: Explanation ?Education comprehension: verbalized understanding ? ? ?HOME EXERCISE PROGRAM: ?None at this time  ? ?ASSESSMENT: ? ?CLINICAL IMPRESSION: ?Patient is a 71 y.o. male  who was seen today for physical therapy evaluation and treatment for balance and gait disorder.  [name] had the most difficulty with tandem/narrow stance activities, dynamic gait with head turns and single limb stance.  Since he does not technically have a neuro diagnosis we will see him here at this location for general strength, balance and coordination and I expect he will improve to a degree.  He can always transfer to Neuro Rehab if needed.   ? ? ?OBJECTIVE IMPAIRMENTS Abnormal gait, decreased balance, decreased coordination, and dizziness.  ? ?ACTIVITY LIMITATIONS community activity, yard work, and recreation .  ? ?PERSONAL FACTORS 1-2 comorbidities: chronic ataxia, occ low back pain  are also affecting patient's functional outcome.  ? ? ?REHAB POTENTIAL: Excellent ? ?CLINICAL DECISION MAKING: Evolving/moderate complexity ? ?EVALUATION COMPLEXITY: Moderate ? ? ?GOALS: ? ?LONG TERM GOALS: Target date: 11/16/2021 ? ?Pt will improve his FGA score to 26/30 indicating decreased community fall risk  ?Baseline: 22/30 , is cutoff  ?Goal status: INITIAL ? ?2.  Pt will be  able to perform SLS on each LE for 15 sec without UE support  ?Baseline: < 5 sec each LE  ?Goal status: INITIAL ? ?3.  Pt will be I with HEP for balance, core/posture and strength upon discharge  ?Baseline

## 2021-09-21 ENCOUNTER — Encounter: Payer: Self-pay | Admitting: Cardiovascular Disease

## 2021-09-21 ENCOUNTER — Other Ambulatory Visit: Payer: Self-pay

## 2021-09-21 ENCOUNTER — Ambulatory Visit: Payer: PPO | Admitting: Physical Therapy

## 2021-09-21 DIAGNOSIS — R2689 Other abnormalities of gait and mobility: Secondary | ICD-10-CM

## 2021-09-21 DIAGNOSIS — M25541 Pain in joints of right hand: Secondary | ICD-10-CM | POA: Diagnosis not present

## 2021-09-21 DIAGNOSIS — R279 Unspecified lack of coordination: Secondary | ICD-10-CM

## 2021-09-28 ENCOUNTER — Ambulatory Visit: Payer: PPO | Admitting: Physical Therapy

## 2021-09-28 ENCOUNTER — Other Ambulatory Visit: Payer: Self-pay

## 2021-09-28 ENCOUNTER — Encounter: Payer: Self-pay | Admitting: Physical Therapy

## 2021-09-28 DIAGNOSIS — R2689 Other abnormalities of gait and mobility: Secondary | ICD-10-CM

## 2021-09-28 DIAGNOSIS — R279 Unspecified lack of coordination: Secondary | ICD-10-CM

## 2021-09-28 DIAGNOSIS — M25541 Pain in joints of right hand: Secondary | ICD-10-CM | POA: Diagnosis not present

## 2021-09-28 NOTE — Therapy (Addendum)
?OUTPATIENT PHYSICAL THERAPY TREATMENT NOTE ? ? ?Patient Name: Clarence Ware ?MRN: [id number] ?DOB:[birth year], 71 y.o., male ?Today's Date: 09/28/2021 ? ?PCP: [name], MD ?REFERRING PROVIDER: [name], MD ? ? PT End of Session - 09/28/21 0935   ? ? Visit Number 2   ? Number of Visits 12   ? Date for PT Re-Evaluation 11/02/21   ? PT Start Time 9833   ? PT Stop Time 1016   ? PT Time Calculation (min) 44 min   ? Activity Tolerance Patient tolerated treatment well   ? Behavior During Therapy [facility] for tasks assessed/performed   ? ?  ?  ? ?  ? ? ?Past Medical History:  ?Diagnosis Date  ? Allergy   ? First degree heart block   ? Hyperlipidemia   ? Hypertension   ? REM sleep behavior disorder   ? Substance abuse ([place])   ? ?Past Surgical History:  ?Procedure Laterality Date  ? INGUINAL HERNIA REPAIR Right 03/10/2015  ? Procedure: OPEN RIGHT INGUINAL HERNIA REPAIR;  Surgeon: Johnathan Hausen, MD;  Location: [facility];  Service: General;  Laterality: Right;  With MESH  ? VASECTOMY    ? ?Patient Active Problem List  ? Diagnosis Date Noted  ? De Quervain's tenosynovitis, right 09/01/2021  ? Cerebellar ataxia in diseases classified elsewhere ([place]) 09/01/2021  ? Seborrheic keratosis 09/01/2021  ? BPH with urinary obstruction 09/01/2021  ? Age-related cognitive decline 08/05/2020  ? Chronic cluster headache, not intractable 03/16/2020  ? Uncontrolled REM sleep behavior disorder 03/16/2020  ? Routine general medical examination at a health care facility 01/18/2017  ? Essential hypertension 12/18/2014  ? Hyperlipidemia with target LDL less than 100 12/18/2014  ? First degree heart block 03/30/2013  ? Prediabetes 06/30/2010  ? GERD 06/26/2008  ? ERECTILE DYSFUNCTION, MILD 01/30/2008  ? VENTRICULAR HYPERTROPHY, LEFT 11/29/2006  ? COLON POLYP 09/06/2006  ? ASTHMA, EXERCISE INDUCED 09/06/2006  ? ? ?REFERRING DIAG: G32.81 (ICD-10-CM) - Cerebellar ataxia in diseases classified elsewhere [facility])  ? ?THERAPY DIAG:  ?Other  abnormalities of gait and mobility ? ?Unspecified lack of coordination ? ?Pain in thumb joint with movement of right hand ? ?PERTINENT HISTORY:  ?Over the last few years he has noticed worsening, abnormal behavior while he is in REM sleep.  He has occasionally knocked lamps off of a bed side table.  This recently worsened when he launched himself from the bed.  He says his wife has to sleep in another room.  He has also developed mild ataxia over the last few years.  ? ?significant periodic limb movements of sleep  ? ? ?PRECAUTIONS: balance, fall ? ?SUBJECTIVE: I'm good, Ive been working in the yard so my back is a little sore ? ?PAIN:  ?Are you having pain? No ? ? ?OBJECTIVE: ? ?DIAGNOSTIC FINDINGS:  ?IMPRESSION:   ?1.  Couple punctate T2/FLAIR hyperintense foci in the subcortical or deep white matter of the hemispheres consistent with very minimal chronic microvascular ischemic change, typical for age. ?2.  Mild ethmoid, sphenoid and right frontal chronic sinusitis. ?3.  There were no acute findings and there was a normal enhancement pattern. ?  ? Will be having more scans in the coming weeks related to the study.  ?  ?PATIENT SURVEYS:  ?FOTO 64% ?  ?COGNITION: ?          Overall cognitive status: Within functional limits for tasks assessed               ?           ?  SENSATION: ?WFL ?  ?MUSCLE LENGTH: ?NT ?  ?POSTURE:  ?Mild swayback posture, forward head  ?  ?PALPATION: ?NT ?  ?LUMBAR ROM:  ?  ?Active  A/PROM  ?09/21/2021  ?Flexion WFL with bent knee   ?Extension Limited 50% mild dizziness in standing   ?Right lateral flexion    ?Left lateral flexion    ?Right rotation WNL  ?Left rotation WNL  ? (Blank rows = not tested) ?  ?  ?LE MMT: ?  ?MMT Right ?09/21/2021 Left ?09/21/2021  ?Hip flexion 4+/5 4+/5  ?Hip extension      ?Hip abduction      ?Hip adduction      ?Hip internal rotation      ?Hip external rotation      ?Knee flexion 4+/5 4+/5  ?Knee extension 5/5 5/5  ?Ankle dorsiflexion 5/5 5/5  ?Ankle plantarflexion       ?Ankle inversion      ?Ankle eversion      ? (Blank rows = not tested) ?  ?WNL in UEs ?Grip strength Rt 68 lbs , Lt. 74 lbs  ?  ?  ?FUNCTIONAL TESTS:  ?5 times sit to stand: 7.5 sec ?30 seconds chair stand test ?Timed up and go (TUG): 8 sec, able to do manual, cognitive tasks as well within normal limits   ?Berg Balance Scale: 53/56 ?Dynamic Gait Index: NT ?Functional gait assessment: 22/30 ?  ?GAIT: ?Distance walked: 150 ?Assistive device utilized: None ?Level of assistance: independent ?Comments: no deviations with self selected gait speed  ?  ?  ?TODAY'S TREATMENT  ?[facility] Adult PT Treatment:                                                DATE: 09/28/21 ?Therapeutic Exercise: ?Pilates Tower for LE/Core strength, postural strength, lumbopelvic disassociation and core control.  Exercises included: ?Supine Leg Springs ?Single leg 1 purple spring arcs and hip circles x 10 in each direction ?Double leg arcs, circles and squats  ?Palloff press 1 blue band x 15 ?Varied foot position as well for balance challenge  ?Neuromuscular re-ed: ?Standing balance activities to establish HEP ?Airex static, narrow with EO, EC ?Tandem with head turns, trunk rotation ?  ?  ?PATIENT EDUCATION:  ?Education details: POC, balance impairments ?Person educated: Patient ?Education method: Explanation ?Education comprehension: verbalized understanding ?  ?  ?HOME EXERCISE PROGRAM: ?Access Code: [email or web address] ?URL: [email or web address] ?Date: 09/30/2021 ?Prepared by: Raeford Razor ? ?Exercises ?- Tandem Stance  - 1 x daily - 7 x weekly - 1 sets - 5 reps - 30 hold ?- Romberg Stance  - 1 x daily - 7 x weekly - 2 sets - 10 reps - 30 hold ?- Romberg Stance with Eyes Closed  - 1 x daily - 7 x weekly - 2 sets - 10 reps - 30 hold ?- Romberg Stance on Foam Pad with Head Rotation  - 1 x daily - 7 x weekly - 2 sets - 10 reps ?- Standing Anti-Rotation Press with Anchored Resistance  - 1 x daily - 7 x weekly - 2 sets - 10 reps - 5 holdNone at  this time  ?  ?ASSESSMENT: ?  ?CLINICAL IMPRESSION: ?Patient given HEP and explained progression to him regarding complaint surfaces, eyes open/closed and head turns for vestibular challenge.  He was also introduced to [employer] for control of  LEs and core .  He did well and could appreciate the challenge the springs offered him .  Cont to challenge balance with various stances, props.  ?  ?OBJECTIVE IMPAIRMENTS Abnormal gait, decreased balance, decreased coordination, and dizziness.  ?  ?ACTIVITY LIMITATIONS community activity, yard work, and recreation .  ?  ?PERSONAL FACTORS 1-2 comorbidities: chronic ataxia, occ low back pain  are also affecting patient's functional outcome.  ?  ?  ?REHAB POTENTIAL: Excellent ?  ?CLINICAL DECISION MAKING: Evolving/moderate complexity ?  ?EVALUATION COMPLEXITY: Moderate ?  ?  ?GOALS: ?  ?LONG TERM GOALS: Target date: 11/16/2021 ?  ?Pt will improve his FGA score to 26/30 indicating decreased community fall risk  ?Baseline: 22/30 , is cutoff  ?Goal status: INITIAL ?  ?2.  Pt will be able to perform SLS on each LE for 15 sec without UE support  ?Baseline: < 5 sec each LE  ?Goal status: INITIAL ?  ?3.  Pt will be I with HEP for balance, core/posture and strength upon discharge  ?Baseline: unknown  ?Goal status: INITIAL ?  ?4.  Pt will report greater sense of confidence when walking on steps, uneven ground in busy environments, sporting events.  ?Baseline: lacks confidence, TBA with FOTO score  ?Goal status: INITIAL ?  ?5.  Pt will be able to TBA if needed  ?Baseline: NT ?Goal status: INITIAL ?  ?  ?  ?PLAN: ?PT FREQUENCY: 2x/week ?  ?PT DURATION: 6 weeks 6-8 weeks  ?  ?PLANNED INTERVENTIONS: Therapeutic exercises, Therapeutic activity, Neuromuscular re-education, Balance training, Gait training, Patient/Family education, Joint mobilization, Vestibular training, and pilates  . ?  ?PLAN FOR NEXT SESSION: check HEP, try standing Palloff press challenges, gait and balance , lateral  walking  ? ? ?Jobina Maita, PT ?09/28/2021, 12:25 PM ? ?  Raeford Razor, PT ?09/28/21 12:25 PM ?Phone: [phone] ?Fax: [phone]  ?

## 2021-09-30 ENCOUNTER — Ambulatory Visit: Payer: PPO | Admitting: Physical Therapy

## 2021-09-30 ENCOUNTER — Other Ambulatory Visit: Payer: Self-pay

## 2021-09-30 DIAGNOSIS — R279 Unspecified lack of coordination: Secondary | ICD-10-CM

## 2021-09-30 DIAGNOSIS — M25541 Pain in joints of right hand: Secondary | ICD-10-CM | POA: Diagnosis not present

## 2021-09-30 DIAGNOSIS — R2689 Other abnormalities of gait and mobility: Secondary | ICD-10-CM

## 2021-09-30 NOTE — Therapy (Deleted)
?OUTPATIENT PHYSICAL THERAPY TREATMENT NOTE ? ? ?Patient Name: Clarence Ware ?MRN: [id number] ?DOB:November 01, 1950, 71 y.o., male ?Today's Date: 09/30/2021 ? ?PCP: [name], MD ?REFERRING PROVIDER: [name], MD ? ? ? ? ?Past Medical History:  ?Diagnosis Date  ? Allergy   ? First degree heart block   ? Hyperlipidemia   ? Hypertension   ? REM sleep behavior disorder   ? Substance abuse ([place])   ? ?Past Surgical History:  ?Procedure Laterality Date  ? INGUINAL HERNIA REPAIR Right 03/10/2015  ? Procedure: OPEN RIGHT INGUINAL HERNIA REPAIR;  Surgeon: Johnathan Hausen, MD;  [facility];  Service: General;  Laterality: Right;  With MESH  ? VASECTOMY    ? ?Patient Active Problem List  ? Diagnosis Date Noted  ? De Quervain's tenosynovitis, right 09/01/2021  ? Cerebellar ataxia in diseases classified elsewhere ([place]) 09/01/2021  ? Seborrheic keratosis 09/01/2021  ? BPH with urinary obstruction 09/01/2021  ? Age-related cognitive decline 08/05/2020  ? Chronic cluster headache, not intractable 03/16/2020  ? Uncontrolled REM sleep behavior disorder 03/16/2020  ? Routine general medical examination at a health care facility 01/18/2017  ? Essential hypertension 12/18/2014  ? Hyperlipidemia with target LDL less than 100 12/18/2014  ? First degree heart block 03/30/2013  ? Prediabetes 06/30/2010  ? GERD 06/26/2008  ? ERECTILE DYSFUNCTION, MILD 01/30/2008  ? VENTRICULAR HYPERTROPHY, LEFT 11/29/2006  ? COLON POLYP 09/06/2006  ? ASTHMA, EXERCISE INDUCED 09/06/2006  ? ? ?REFERRING DIAG: G32.81 (ICD-10-CM) - Cerebellar ataxia in diseases classified elsewhere [facility])  ? ?THERAPY DIAG:  ?No diagnosis found. ? ?PERTINENT HISTORY:  ?Over the last few years he has noticed worsening, abnormal behavior while he is in REM sleep.  He has occasionally knocked lamps off of a bed side table.  This recently worsened when he launched himself from the bed.  He says his wife has to sleep in another room.  He has also developed mild ataxia over  the last few years.  ? ?significant periodic limb movements of sleep  ? ? ?PRECAUTIONS: balance, fall ? ?SUBJECTIVE: I'm good, Ive been working in the yard so my back is a little sore ? ?PAIN:  ?Are you having pain? No ? ? ?OBJECTIVE: ? ?DIAGNOSTIC FINDINGS:  ?IMPRESSION:   ?1.  Couple punctate T2/FLAIR hyperintense foci in the subcortical or deep white matter of the hemispheres consistent with very minimal chronic microvascular ischemic change, typical for age. ?2.  Mild ethmoid, sphenoid and right frontal chronic sinusitis. ?3.  There were no acute findings and there was a normal enhancement pattern. ?  ? Will be having more scans in the coming weeks related to the study.  ?  ?PATIENT SURVEYS:  ?FOTO 64% ?  ?COGNITION: ?          Overall cognitive status: Within functional limits for tasks assessed               ?           ?SENSATION: ?WFL ?  ?MUSCLE LENGTH: ?NT ?  ?POSTURE:  ?Mild swayback posture, forward head  ?  ?PALPATION: ?NT ?  ?LUMBAR ROM:  ?  ?Active  A/PROM  ?09/21/2021  ?Flexion WFL with bent knee   ?Extension Limited 50% mild dizziness in standing   ?Right lateral flexion    ?Left lateral flexion    ?Right rotation WNL  ?Left rotation WNL  ? (Blank rows = not tested) ?  ?  ?LE MMT: ?  ?MMT Right ?09/21/2021 Left ?09/21/2021  ?  Hip flexion 4+/5 4+/5  ?Hip extension      ?Hip abduction      ?Hip adduction      ?Hip internal rotation      ?Hip external rotation      ?Knee flexion 4+/5 4+/5  ?Knee extension 5/5 5/5  ?Ankle dorsiflexion 5/5 5/5  ?Ankle plantarflexion      ?Ankle inversion      ?Ankle eversion      ? (Blank rows = not tested) ?  ?WNL in UEs ?Grip strength Rt 68 lbs , Lt. 74 lbs  ?  ?  ?FUNCTIONAL TESTS:  ?5 times sit to stand: 7.5 sec ?30 seconds chair stand test ?Timed up and go (TUG): 8 sec, able to do manual, cognitive tasks as well within normal limits   ?Berg Balance Scale: 53/56 ?Dynamic Gait Index: NT ?Functional gait assessment: 22/30 ?  ?GAIT: ?Distance walked: 150 ?Assistive device  utilized: None ?Level of assistance: independent ?Comments: no deviations with self selected gait speed  ?  ?  ?TODAY'S TREATMENT  ?See above for physical performance testing  ?  ?  ?PATIENT EDUCATION:  ?Education details: POC, balance impairments ?Person educated: Patient ?Education method: Explanation ?Education comprehension: verbalized understanding ?  ?  ?HOME EXERCISE PROGRAM: ?None at this time  ?  ?ASSESSMENT: ?  ?CLINICAL IMPRESSION: ?Patient given HEP and explained progression to him regarding complaint surfaces, eyes open/closed and head turns for vestibular challenge.  He was also introduced to [employer] for control of LEs and core .  He did well and could appreciate the challenge the springs offered him .  Cont to challenge balance with various stances, props.  ?  ?OBJECTIVE IMPAIRMENTS Abnormal gait, decreased balance, decreased coordination, and dizziness.  ?  ?ACTIVITY LIMITATIONS community activity, yard work, and recreation .  ?  ?PERSONAL FACTORS 1-2 comorbidities: chronic ataxia, occ low back pain  are also affecting patient's functional outcome.  ?  ?  ?REHAB POTENTIAL: Excellent ?  ?CLINICAL DECISION MAKING: Evolving/moderate complexity ?  ?EVALUATION COMPLEXITY: Moderate ?  ?  ?GOALS: ?  ?LONG TERM GOALS: Target date: 11/16/2021 ?  ?Pt will improve his FGA score to 26/30 indicating decreased community fall risk  ?Baseline: 22/30 , is cutoff  ?Goal status: INITIAL ?  ?2.  Pt will be able to perform SLS on each LE for 15 sec without UE support  ?Baseline: < 5 sec each LE  ?Goal status: INITIAL ?  ?3.  Pt will be I with HEP for balance, core/posture and strength upon discharge  ?Baseline: unknown  ?Goal status: INITIAL ?  ?4.  Pt will report greater sense of confidence when walking on steps, uneven ground in busy environments, sporting events.  ?Baseline: lacks confidence, TBA with FOTO score  ?Goal status: INITIAL ?  ?5.  Pt will be able to TBA if needed  ?Baseline: NT ?Goal status: INITIAL ?   ?  ?  ?PLAN: ?PT FREQUENCY: 2x/week ?  ?PT DURATION: 6 weeks 6-8 weeks  ?  ?PLANNED INTERVENTIONS: Therapeutic exercises, Therapeutic activity, Neuromuscular re-education, Balance training, Gait training, Patient/Family education, Joint mobilization, Vestibular training, and pilates  . ?  ?PLAN FOR NEXT SESSION: check HEP, try standing Palloff press challenges, gait and balance , lateral walking  ? ? ?Shawanda Sievert, PT ?09/30/2021, 8:04 AM ? ?  Raeford Razor, PT ?09/30/21 8:04 AM ?Phone: [phone] ?Fax: [phone]  ?

## 2021-09-30 NOTE — Therapy (Signed)
?OUTPATIENT PHYSICAL THERAPY TREATMENT NOTE ? ? ?Patient Name: Clarence Ware ?MRN: [id number] ?DOB:1950/10/21, 71 y.o., male ?Today's Date: 09/30/2021 ? ?PCP: [name], MD ?REFERRING PROVIDER: [name], MD ? ? PT End of Session - 09/30/21 0941   ? ? Visit Number 3   ? Number of Visits 12   ? Date for PT Re-Evaluation 11/02/21   ? Authorization Type [employer]   ? PT Start Time 0930   ? PT Stop Time 1015   ? PT Time Calculation (min) 45 min   ? Activity Tolerance Patient tolerated treatment well   ? Behavior During Therapy [facility] for tasks assessed/performed   ? ?  ?  ? ?  ? ? ? ?Past Medical History:  ?Diagnosis Date  ? Allergy   ? First degree heart block   ? Hyperlipidemia   ? Hypertension   ? REM sleep behavior disorder   ? Substance abuse ([place])   ? ?Past Surgical History:  ?Procedure Laterality Date  ? INGUINAL HERNIA REPAIR Right 03/10/2015  ? Procedure: OPEN RIGHT INGUINAL HERNIA REPAIR;  Surgeon: Johnathan Hausen, MD;  Location: [facility];  Service: General;  Laterality: Right;  With MESH  ? VASECTOMY    ? ?Patient Active Problem List  ? Diagnosis Date Noted  ? De Quervain's tenosynovitis, right 09/01/2021  ? Cerebellar ataxia in diseases classified elsewhere ([place]) 09/01/2021  ? Seborrheic keratosis 09/01/2021  ? BPH with urinary obstruction 09/01/2021  ? Age-related cognitive decline 08/05/2020  ? Chronic cluster headache, not intractable 03/16/2020  ? Uncontrolled REM sleep behavior disorder 03/16/2020  ? Routine general medical examination at a health care facility 01/18/2017  ? Essential hypertension 12/18/2014  ? Hyperlipidemia with target LDL less than 100 12/18/2014  ? First degree heart block 03/30/2013  ? Prediabetes 06/30/2010  ? GERD 06/26/2008  ? ERECTILE DYSFUNCTION, MILD 01/30/2008  ? VENTRICULAR HYPERTROPHY, LEFT 11/29/2006  ? COLON POLYP 09/06/2006  ? ASTHMA, EXERCISE INDUCED 09/06/2006  ? ? 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 ?THERAPY DIAG:  ?Other abnormalities of gait and mobility ? ?Unspecified lack of coordination ? ?PERTINENT HISTORY:  ?Over the last few years he has noticed worsening, abnormal behavior while he is in REM sleep.  He has occasionally knocked lamps off of a bed side table.  This recently worsened when he launched himself from the bed.  He says his wife has to sleep in another room.  He has also developed mild ataxia over the last few years.  ? ?significant periodic limb movements of sleep  ? ? ?PRECAUTIONS: balance, fall ? ?SUBJECTIVE:  ?Not sore from last session.  Did some of the balance exercises .  ?PAIN:  ?Are you having pain? No ? ? ?OBJECTIVE: ? ?DIAGNOSTIC FINDINGS:  ?IMPRESSION:   ?1.  Couple punctate T2/FLAIR hyperintense foci in the subcortical or deep white matter of the hemispheres consistent with very minimal chronic microvascular ischemic change, typical for age. ?2.  Mild ethmoid, sphenoid and right frontal chronic sinusitis. ?3.  There were no acute findings and there was a normal enhancement pattern. ?  ? Will be having more scans in the coming weeks related to the study.  ?  ?PATIENT SURVEYS:  ?FOTO 64% ?  ?COGNITION: ?          Overall cognitive status: Within functional limits for tasks assessed               ?           ?  SENSATION: ?WFL ?  ?MUSCLE LENGTH: ?NT ?  ?POSTURE:  ?Mild swayback posture, forward head  ?  ?PALPATION: ?NT ?  ?LUMBAR ROM:  ?  ?Active  A/PROM  ?09/21/2021  ?Flexion WFL with bent knee   ?Extension Limited 50% mild dizziness in standing   ?Right lateral flexion    ?Left lateral flexion    ?Right rotation WNL  ?Left rotation WNL  ? (Blank rows = not tested) ?  ?  ?LE MMT: ?  ?MMT Right ?09/21/2021 Left ?09/21/2021  ?Hip flexion 4+/5 4+/5  ?Hip extension      ?Hip abduction      ?Hip adduction      ?Hip internal rotation      ?Hip external rotation      ?Knee flexion 4+/5 4+/5  ?Knee extension 5/5 5/5  ?Ankle dorsiflexion 5/5 5/5  ?Ankle plantarflexion       ?Ankle inversion      ?Ankle eversion      ? (Blank rows = not tested) ?  ?WNL in UEs ?Grip strength Rt 68 lbs , Lt. 74 lbs  ?  ?  ?FUNCTIONAL TESTS:  ?5 times sit to stand: 7.5 sec ?30 seconds chair stand test ?Timed up and go (TUG): 8 sec, able to do manual, cognitive tasks as well within normal limits   ?Berg Balance Scale: 53/56 ?Dynamic Gait Index: NT ?Functional gait assessment: 22/30 ?  ?GAIT: ?Distance walked: 150 ?Assistive device utilized: None ?Level of assistance: independent ?Comments: no deviations with self selected gait speed  ?  ?  ?TODAY'S TREATMENT  ? ? ? ? ?[facility] [facility] Adult PT Treatment:                                                DATE: 09/30/21 ?Therapeutic Exercise: ?Recumbent bike 5 min L 2 small rolling hills  ?Weighted carry 25 lbs each UE x 180 feet ?Neuromuscular re-ed: ?Squats on Airex  x 15  ?Narrow on Airex EO 1 min, EC intermittent UE assist  ?Hip abduction x 15 light UE  ?Rockerboard hold parallel then added dynamic ankle motion in DF/PF and then Medial/lateral  ?Rocker board head turns, EC variations  ?Tandem blue bands extension alternating shoulders  ?High knees blue band isometric flexion shoulder  ?BOSU lunge alternating with countertop hold  ?BOSU rounded side static and then mini squat ?BOSU flat side up then mini squat . Increased time for gaining control and balance .  ?Semi-tandem rebounder  ? ?[facility] Adult PT Treatment:                                                DATE: 09/28/21 ?Therapeutic Exercise: ?Pilates Tower for LE/Core strength, postural strength, lumbopelvic disassociation and core control.  Exercises included: ?Supine Leg Springs ?Single leg 1 purple spring arcs and hip circles x 10 in each direction ?Double leg arcs, circles and squats  ?Palloff press 1 blue band x 15 ?Varied foot position as well for balance challenge  ?Neuromuscular re-ed: ?Standing balance activities to establish HEP ?Airex static, narrow with EO, EC ?Tandem with head turns, trunk rotation ?  ?   ?PATIENT EDUCATION:  ?Education details: POC, balance impairments ?Person educated: Patient ?Education method: Explanation ?Education comprehension: verbalized  understanding ?  ?  ?HOME EXERCISE PROGRAM: ?Access Code: [email or web address] ?URL: [email or web address] ?Date: 09/30/2021 ?Prepared by: Raeford Razor ? ?Exercises ?- Tandem Stance  - 1 x daily - 7 x weekly - 1 sets - 5 reps - 30 hold ?- Romberg Stance  - 1 x daily - 7 x weekly - 2 sets - 10 reps - 30 hold ?- Romberg Stance with Eyes Closed  - 1 x daily - 7 x weekly - 2 sets - 10 reps - 30 hold ?- Romberg Stance on Foam Pad with Head Rotation  - 1 x daily - 7 x weekly - 2 sets - 10 reps ?- Standing Anti-Rotation Press with Anchored Resistance  - 1 x daily - 7 x weekly - 2 sets - 10 reps - 5 holdNone at this time  ?  ?ASSESSMENT: ?  ?CLINICAL IMPRESSION: ? ?Patient was progressively challenged with multi-modal balance exercises.  Has good overall cardiac conditioning.  Needed light min A from time to time with head turns and single leg challenges.  ? ?  ?OBJECTIVE IMPAIRMENTS Abnormal gait, decreased balance, decreased coordination, and dizziness.  ?  ?ACTIVITY LIMITATIONS community activity, yard work, and recreation .  ?  ?PERSONAL FACTORS 1-2 comorbidities: chronic ataxia, occ low back pain  are also affecting patient's functional outcome.  ?  ?  ?REHAB POTENTIAL: Excellent ?  ?CLINICAL DECISION MAKING: Evolving/moderate complexity ?  ?EVALUATION COMPLEXITY: Moderate ?  ?  ?GOALS: ?  ?LONG TERM GOALS: Target date: 11/16/2021 ?  ?Pt will improve his FGA score to 26/30 indicating decreased community fall risk  ?Baseline: 22/30 , is cutoff  ?Goal status: INITIAL ?  ?2.  Pt will be able to perform SLS on each LE for 15 sec without UE support  ?Baseline: < 5 sec each LE  ?Goal status: INITIAL ?  ?3.  Pt will be I with HEP for balance, core/posture and strength upon discharge  ?Baseline: unknown  ?Goal status: INITIAL ?  ?4.  Pt will report greater sense of  confidence when walking on steps, uneven ground in busy environments, sporting events.  ?Baseline: lacks confidence, 90% limited on FOTO  ?Goal status: INITIAL ?  ?  ?  ?PLAN: ?PT FREQUENCY: 2x/week ?  ?PT DURATION:

## 2021-10-04 ENCOUNTER — Other Ambulatory Visit: Payer: Self-pay

## 2021-10-04 ENCOUNTER — Ambulatory Visit: Payer: PPO | Admitting: Physical Therapy

## 2021-10-04 ENCOUNTER — Encounter: Payer: Self-pay | Admitting: Physical Therapy

## 2021-10-04 DIAGNOSIS — M25541 Pain in joints of right hand: Secondary | ICD-10-CM

## 2021-10-04 DIAGNOSIS — R2689 Other abnormalities of gait and mobility: Secondary | ICD-10-CM

## 2021-10-04 DIAGNOSIS — R279 Unspecified lack of coordination: Secondary | ICD-10-CM

## 2021-10-04 NOTE — Therapy (Signed)
?OUTPATIENT PHYSICAL THERAPY TREATMENT NOTE ? ? ?Patient Name: Clarence Ware ?MRN: [id number] ?DOB:[birth year], 71 y.o., male ?Today's Date: 10/04/2021 ? ?PCP: [name], MD ?REFERRING PROVIDER: [name], MD ? ? PT End of Session - 10/04/21 1884   ? ? Visit Number 4   ? Number of Visits 12   ? Date for PT Re-Evaluation 11/02/21   ? Authorization Type [employer]   ? PT Start Time 0930   ? PT Stop Time 1015   ? PT Time Calculation (min) 45 min   ? ?  ?  ? ?  ? ? ? ?Past Medical History:  ?Diagnosis Date  ? Allergy   ? First degree heart block   ? Hyperlipidemia   ? Hypertension   ? REM sleep behavior disorder   ? Substance abuse ([place])   ? ?Past Surgical History:  ?Procedure Laterality Date  ? INGUINAL HERNIA REPAIR Right 03/10/2015  ? Procedure: OPEN RIGHT INGUINAL HERNIA REPAIR;  Surgeon: Johnathan Hausen, MD;  Location: [facility];  Service: General;  Laterality: Right;  With MESH  ? VASECTOMY    ? ?Patient Active Problem List  ? Diagnosis Date Noted  ? De Quervain's tenosynovitis, right 09/01/2021  ? Cerebellar ataxia in diseases classified elsewhere ([place]) 09/01/2021  ? Seborrheic keratosis 09/01/2021  ? BPH with urinary obstruction 09/01/2021  ? Age-related cognitive decline 08/05/2020  ? Chronic cluster headache, not intractable 03/16/2020  ? Uncontrolled REM sleep behavior disorder 03/16/2020  ? Routine general medical examination at a health care facility 01/18/2017  ? Essential hypertension 12/18/2014  ? Hyperlipidemia with target LDL less than 100 12/18/2014  ? First degree heart block 03/30/2013  ? Prediabetes 06/30/2010  ? GERD 06/26/2008  ? ERECTILE DYSFUNCTION, MILD 01/30/2008  ? VENTRICULAR HYPERTROPHY, LEFT 11/29/2006  ? COLON POLYP 09/06/2006  ? ASTHMA, EXERCISE INDUCED 09/06/2006  ? ? ?REFERRING DIAG: G32.81 (ICD-10-CM) - Cerebellar ataxia in diseases classified elsewhere [facility])  ? ?THERAPY DIAG:  ?Other abnormalities of gait and mobility ? ?Unspecified lack of coordination ? ?Pain in  thumb joint with movement of right hand ? ?PERTINENT HISTORY:  ?Over the last few years he has noticed worsening, abnormal behavior while he is in REM sleep.  He has occasionally knocked lamps off of a bed side table.  This recently worsened when he launched himself from the bed.  He says his wife has to sleep in another room.  He has also developed mild ataxia over the last few years.  ?  ? ?significant periodic limb movements of sleep  ? ? ?PRECAUTIONS: balance, fall ? ?SUBJECTIVE:  ?I feel like I am getting better. I notice I can hold the balance exercises longer.  ?PAIN:  ?Are you having pain? No ? ? ?OBJECTIVE: ? ?DIAGNOSTIC FINDINGS:  ?IMPRESSION:   ?1.  Couple punctate T2/FLAIR hyperintense foci in the subcortical or deep white matter of the hemispheres consistent with very minimal chronic microvascular ischemic change, typical for age. ?2.  Mild ethmoid, sphenoid and right frontal chronic sinusitis. ?3.  There were no acute findings and there was a normal enhancement pattern. ?  ? Will be having more scans in the coming weeks related to the study.  ?  ?PATIENT SURVEYS:  ?FOTO 64% ?  ?COGNITION: ?          Overall cognitive status: Within functional limits for tasks assessed               ?           ?  SENSATION: ?WFL ?  ?MUSCLE LENGTH: ?NT ?  ?POSTURE:  ?Mild swayback posture, forward head  ?  ?PALPATION: ?NT ?  ?LUMBAR ROM:  ?  ?Active  A/PROM  ?09/21/2021  ?Flexion WFL with bent knee   ?Extension Limited 50% mild dizziness in standing   ?Right lateral flexion    ?Left lateral flexion    ?Right rotation WNL  ?Left rotation WNL  ? (Blank rows = not tested) ?  ?  ?LE MMT: ?  ?MMT Right ?09/21/2021 Left ?09/21/2021  ?Hip flexion 4+/5 4+/5  ?Hip extension      ?Hip abduction      ?Hip adduction      ?Hip internal rotation      ?Hip external rotation      ?Knee flexion 4+/5 4+/5  ?Knee extension 5/5 5/5  ?Ankle dorsiflexion 5/5 5/5  ?Ankle plantarflexion      ?Ankle inversion      ?Ankle eversion      ? (Blank rows  = not tested) ?  ?WNL in UEs ?Grip strength Rt 68 lbs , Lt. 74 lbs  ?  ?  ?FUNCTIONAL TESTS:  ?5 times sit to stand: 7.5 sec ?30 seconds chair stand test ?Timed up and go (TUG): 8 sec, able to do manual, cognitive tasks as well within normal limits   ?Berg Balance Scale: 53/56 ?Dynamic Gait Index: NT ?Functional gait assessment: 22/30 ?  ?GAIT: ?Distance walked: 150 ?Assistive device utilized: None ?Level of assistance: independent ?Comments: no deviations with self selected gait speed  ?  ?  ?TODAY'S TREATMENT  ? ?[facility] Adult PT Treatment:                                                DATE: 10/04/21 ?Therapeutic Exercise: ?Recumbent bike 5 min L 3  ?Slant board stretch ?Neuromuscular re-ed: ?Squats on BOSU flat side ?Narrow on Airex EO 1 min, tandem  ?Tandem on airex ?Hip abduction x 15 light UE  ?BOSU DF/PF and then Medial/lateral on each side ? ?Rotational stabilization red band single arm pull- feet in semitandem x 10 each  ?BOSU step ups x 10 each light touch ?BOSU normal stance each side balance , weight shifting ? ? ? ?[facility] Adult PT Treatment:                                                DATE: 09/30/21 ?Therapeutic Exercise: ?Recumbent bike 5 min L 2 small rolling hills  ?Weighted carry 25 lbs each UE x 180 feet ?Neuromuscular re-ed: ?Squats on Airex  x 15  ?Narrow on Airex EO 1 min, EC intermittent UE assist  ?Hip abduction x 15 light UE  ?Rockerboard hold parallel then added dynamic ankle motion in DF/PF and then Medial/lateral  ?Rocker board head turns, EC variations  ?Tandem blue bands extension alternating shoulders  ?High knees blue band isometric flexion shoulder  ?BOSU lunge alternating with countertop hold  ?BOSU rounded side static and then mini squat ?BOSU flat side up then mini squat . Increased time for gaining control and balance .  ?Semi-tandem rebounder  ? ?[facility] Adult PT Treatment:  DATE: 09/28/21 ?Therapeutic Exercise: ?Pilates Tower for LE/Core  strength, postural strength, lumbopelvic disassociation and core control.  Exercises included: ?Supine Leg Springs ?Single leg 1 purple spring arcs and hip circles x 10 in each direction ?Double leg arcs, circles and squats  ?Palloff press 1 blue band x 15 ?Varied foot position as well for balance challenge  ?Neuromuscular re-ed: ?Standing balance activities to establish HEP ?Airex static, narrow with EO, EC ?Tandem with head turns, trunk rotation ?  ?  ?PATIENT EDUCATION:  ?Education details: POC, balance impairments ?Person educated: Patient ?Education method: Explanation ?Education comprehension: verbalized understanding ?  ?  ?HOME EXERCISE PROGRAM: ?Access Code: [email or web address] ?URL: [email or web address] ?Date: 09/30/2021 ?Prepared by: Raeford Razor ? ?Exercises ?- Tandem Stance  - 1 x daily - 7 x weekly - 1 sets - 5 reps - 30 hold ?- Romberg Stance  - 1 x daily - 7 x weekly - 2 sets - 10 reps - 30 hold ?- Romberg Stance with Eyes Closed  - 1 x daily - 7 x weekly - 2 sets - 10 reps - 30 hold ?- Romberg Stance on Foam Pad with Head Rotation  - 1 x daily - 7 x weekly - 2 sets - 10 reps ?- Standing Anti-Rotation Press with Anchored Resistance  - 1 x daily - 7 x weekly - 2 sets - 10 reps - 5 holdNone at this time  ?  ?ASSESSMENT: ?  ?CLINICAL IMPRESSION: ?Pt had questions about the length of hold times on HEP and these were corrected so he can use the HEP video at home.  ?Patient was progressively challenged with multi-modal balance exercises.  He tolerated all challenges with SBA inside parallel bars for UE assist as needed.  ? ?  ?OBJECTIVE IMPAIRMENTS Abnormal gait, decreased balance, decreased coordination, and dizziness.  ?  ?ACTIVITY LIMITATIONS community activity, yard work, and recreation .  ?  ?PERSONAL FACTORS 1-2 comorbidities: chronic ataxia, occ low back pain  are also affecting patient's functional outcome.  ?  ?  ?REHAB POTENTIAL: Excellent ?  ?CLINICAL DECISION MAKING: Evolving/moderate  complexity ?  ?EVALUATION COMPLEXITY: Moderate ?  ?  ?GOALS: ?  ?LONG TERM GOALS: Target date: 11/16/2021 ?  ?Pt will improve his FGA score to 26/30 indicating decreased community fall risk  ?Baseline: 22/30

## 2021-10-07 ENCOUNTER — Encounter: Payer: Self-pay | Admitting: Physical Therapy

## 2021-10-07 ENCOUNTER — Ambulatory Visit: Payer: PPO | Admitting: Physical Therapy

## 2021-10-07 DIAGNOSIS — R279 Unspecified lack of coordination: Secondary | ICD-10-CM

## 2021-10-07 DIAGNOSIS — M25541 Pain in joints of right hand: Secondary | ICD-10-CM

## 2021-10-07 DIAGNOSIS — R2689 Other abnormalities of gait and mobility: Secondary | ICD-10-CM

## 2021-10-07 NOTE — Therapy (Signed)
?OUTPATIENT PHYSICAL THERAPY TREATMENT NOTE ? ? ?Patient Name: Clarence Ware ?MRN: [id number] ?DOB:[birth year], 71 y.o., male ?Today's Date: 10/07/2021 ? ?PCP: [name], MD ?REFERRING PROVIDER: [name], MD ? ? PT End of Session - 10/07/21 0924   ? ? Visit Number 5   ? Number of Visits 12   ? Date for PT Re-Evaluation 11/02/21   ? Authorization Type [employer]   ? PT Start Time [phone]   ? PT Stop Time 1007   ? PT Time Calculation (min) 45 min   ? ?  ?  ? ?  ? ? ? ?Past Medical History:  ?Diagnosis Date  ? Allergy   ? First degree heart block   ? Hyperlipidemia   ? Hypertension   ? REM sleep behavior disorder   ? Substance abuse ([place])   ? ?Past Surgical History:  ?Procedure Laterality Date  ? INGUINAL HERNIA REPAIR Right 03/10/2015  ? Procedure: OPEN RIGHT INGUINAL HERNIA REPAIR;  Surgeon: Johnathan Hausen, MD;  Location: [facility];  Service: General;  Laterality: Right;  With MESH  ? VASECTOMY    ? ?Patient Active Problem List  ? Diagnosis Date Noted  ? De Quervain's tenosynovitis, right 09/01/2021  ? Cerebellar ataxia in diseases classified elsewhere ([place]) 09/01/2021  ? Seborrheic keratosis 09/01/2021  ? BPH with urinary obstruction 09/01/2021  ? Age-related cognitive decline 08/05/2020  ? Chronic cluster headache, not intractable 03/16/2020  ? Uncontrolled REM sleep behavior disorder 03/16/2020  ? Routine general medical examination at a health care facility 01/18/2017  ? Essential hypertension 12/18/2014  ? Hyperlipidemia with target LDL less than 100 12/18/2014  ? First degree heart block 03/30/2013  ? Prediabetes 06/30/2010  ? GERD 06/26/2008  ? ERECTILE DYSFUNCTION, MILD 01/30/2008  ? VENTRICULAR HYPERTROPHY, LEFT 11/29/2006  ? COLON POLYP 09/06/2006  ? ASTHMA, EXERCISE INDUCED 09/06/2006  ? ? ?REFERRING DIAG: G32.81 (ICD-10-CM) - Cerebellar ataxia in diseases classified elsewhere [facility])  ? ?THERAPY DIAG:  ?Other abnormalities of gait and mobility ? ?Unspecified lack of coordination ? ?Pain in  thumb joint with movement of right hand ? ?PERTINENT HISTORY:  ?Over the last few years he has noticed worsening, abnormal behavior while he is in REM sleep.  He has occasionally knocked lamps off of a bed side table.  This recently worsened when he launched himself from the bed.  He says his wife has to sleep in another room.  He has also developed mild ataxia over the last few years.  ?  ? ?significant periodic limb movements of sleep  ? ? ?PRECAUTIONS: balance, fall ? ?SUBJECTIVE:  ?I feel like I am getting better. I notice I can hold the balance exercises longer.  ?PAIN:  ?Are you having pain? No ? ? ?OBJECTIVE: ? ?DIAGNOSTIC FINDINGS:  ?IMPRESSION:   ?1.  Couple punctate T2/FLAIR hyperintense foci in the subcortical or deep white matter of the hemispheres consistent with very minimal chronic microvascular ischemic change, typical for age. ?2.  Mild ethmoid, sphenoid and right frontal chronic sinusitis. ?3.  There were no acute findings and there was a normal enhancement pattern. ?  ? Will be having more scans in the coming weeks related to the study.  ?  ?PATIENT SURVEYS:  ?FOTO 64% ?  ?COGNITION: ?          Overall cognitive status: Within functional limits for tasks assessed               ?           ?  SENSATION: ?WFL ?  ?MUSCLE LENGTH: ?NT ?  ?POSTURE:  ?Mild swayback posture, forward head  ?  ?PALPATION: ?NT ?  ?LUMBAR ROM:  ?  ?Active  A/PROM  ?09/21/2021  ?Flexion WFL with bent knee   ?Extension Limited 50% mild dizziness in standing   ?Right lateral flexion    ?Left lateral flexion    ?Right rotation WNL  ?Left rotation WNL  ? (Blank rows = not tested) ?  ?  ?LE MMT: ?  ?MMT Right ?09/21/2021 Left ?09/21/2021  ?Hip flexion 4+/5 4+/5  ?Hip extension      ?Hip abduction      ?Hip adduction      ?Hip internal rotation      ?Hip external rotation      ?Knee flexion 4+/5 4+/5  ?Knee extension 5/5 5/5  ?Ankle dorsiflexion 5/5 5/5  ?Ankle plantarflexion      ?Ankle inversion      ?Ankle eversion      ? (Blank rows  = not tested) ?  ?WNL in UEs ?Grip strength Rt 68 lbs , Lt. 74 lbs  ?  ?  ?FUNCTIONAL TESTS:  ?5 times sit to stand: 7.5 sec ?30 seconds chair stand test ?Timed up and go (TUG): 8 sec, able to do manual, cognitive tasks as well within normal limits   ?Berg Balance Scale: 53/56 ?Dynamic Gait Index: NT ?Functional gait assessment: 22/30 ?  ?GAIT: ?Distance walked: 150 ?Assistive device utilized: None ?Level of assistance: independent ?Comments: no deviations with self selected gait speed  ?  ?  ?TODAY'S TREATMENT  ? ?[facility] Adult PT Treatment:                                                DATE: 10/07/21 ?Therapeutic Exercise: ?Nustep L5 x 6 min ?Weighted carry 25 lbs each UE x 180 feet ?Slant board stretch ?Airex heel and toe raises ?Neuromuscular re-ed: ?Hurdle step overs forward and side ways ?Standing cone taps x 3 x 5 each leg,  ?alternating and unilateral cone taps with SLS- then from airex ?Wooden rocker without UE ?Hip abduction x 15 light UE with SLS on foam oval x 12 each - light 2 finger touch ?Rotational stabilization red /green band single arm pull- feet in narrow then semitandem x 10 each   ? ?STS 10# x 15 ?SLS 4-5 sec best bilat ? ? ?[facility] Adult PT Treatment:                                                DATE: 10/04/21 ?Therapeutic Exercise: ?Recumbent bike 5 min L 3  ?Slant board stretch ?Neuromuscular re-ed: ?Squats on BOSU flat side ?Narrow on Airex EO 1 min, tandem  ?Tandem on airex ?Hip abduction x 15 light UE  ?BOSU DF/PF and then Medial/lateral on each side ? ?Rotational stabilization red band single arm pull- feet in semitandem x 10 each  ?BOSU step ups x 10 each light touch ?BOSU normal stance each side balance , weight shifting ? ? ? ?[facility] Adult PT Treatment:  DATE: 09/30/21 ?Therapeutic Exercise: ?Recumbent bike 5 min L 2 small rolling hills  ?Weighted carry 25 lbs each UE x 180 feet ?Neuromuscular re-ed: ?Squats on Airex  x 15  ?Narrow on Airex EO 1  min, EC intermittent UE assist  ?Hip abduction x 15 light UE  ?Rockerboard hold parallel then added dynamic ankle motion in DF/PF and then Medial/lateral  ?Rocker board head turns, EC variations  ?Tandem blue bands extension alternating shoulders  ?High knees blue band isometric flexion shoulder  ?BOSU lunge alternating with countertop hold  ?BOSU rounded side static and then mini squat ?BOSU flat side up then mini squat . Increased time for gaining control and balance .  ?Semi-tandem rebounder  ? ?[facility] Adult PT Treatment:                                                DATE: 09/28/21 ?Therapeutic Exercise: ?Pilates Tower for LE/Core strength, postural strength, lumbopelvic disassociation and core control.  Exercises included: ?Supine Leg Springs ?Single leg 1 purple spring arcs and hip circles x 10 in each direction ?Double leg arcs, circles and squats  ?Palloff press 1 blue band x 15 ?Varied foot position as well for balance challenge  ?Neuromuscular re-ed: ?Standing balance activities to establish HEP ?Airex static, narrow with EO, EC ?Tandem with head turns, trunk rotation ?  ?  ?PATIENT EDUCATION:  ?Education details: POC, balance impairments ?Person educated: Patient ?Education method: Explanation ?Education comprehension: verbalized understanding ?  ?  ?HOME EXERCISE PROGRAM: ?Access Code: [email or web address] ?URL: [email or web address] ?Date: 09/30/2021 ?Prepared by: Raeford Razor ? ?Exercises ?- Tandem Stance  - 1 x daily - 7 x weekly - 1 sets - 5 reps - 30 hold ?- Romberg Stance  - 1 x daily - 7 x weekly - 2 sets - 10 reps - 30 hold ?- Romberg Stance with Eyes Closed  - 1 x daily - 7 x weekly - 2 sets - 10 reps - 30 hold ?- Romberg Stance on Foam Pad with Head Rotation  - 1 x daily - 7 x weekly - 2 sets - 10 reps ?- Standing Anti-Rotation Press with Anchored Resistance  - 1 x daily - 7 x weekly - 2 sets - 10 reps - 5 holdNone at this time  ?  ?ASSESSMENT: ?  ?CLINICAL IMPRESSION: ?Patient was progressively  challenged with multi-modal balance exercises.  He tolerated all challenges with SBA.  Min improvement in SLS time.  ? ?  ?OBJECTIVE IMPAIRMENTS Abnormal gait, decreased balance, decreased coordinatio

## 2021-10-13 ENCOUNTER — Encounter: Payer: Self-pay | Admitting: Physical Therapy

## 2021-10-13 ENCOUNTER — Ambulatory Visit: Payer: PPO | Attending: Internal Medicine | Admitting: Physical Therapy

## 2021-10-13 DIAGNOSIS — R2689 Other abnormalities of gait and mobility: Secondary | ICD-10-CM | POA: Insufficient documentation

## 2021-10-13 DIAGNOSIS — R279 Unspecified lack of coordination: Secondary | ICD-10-CM | POA: Diagnosis not present

## 2021-10-13 DIAGNOSIS — M25541 Pain in joints of right hand: Secondary | ICD-10-CM | POA: Insufficient documentation

## 2021-10-13 NOTE — Therapy (Signed)
?OUTPATIENT PHYSICAL THERAPY TREATMENT NOTE ? ? ?Patient Name: Clarence Ware ?MRN: [id number] ?DOB:[birth year], 71 y.o., male ?Today's Date: 10/13/2021 ? ?PCP: [name], MD ?REFERRING PROVIDER: [name], MD ? ? PT End of Session - 10/13/21 0848   ? ? Visit Number 6   ? Number of Visits 12   ? Date for PT Re-Evaluation 11/02/21   ? Authorization Type [employer]   ? PT Start Time [phone]   ? PT Stop Time 0926   ? PT Time Calculation (min) 40 min   ? ?  ?  ? ?  ? ? ? ?Past Medical History:  ?Diagnosis Date  ? Allergy   ? First degree heart block   ? Hyperlipidemia   ? Hypertension   ? REM sleep behavior disorder   ? Substance abuse ([place])   ? ?Past Surgical History:  ?Procedure Laterality Date  ? INGUINAL HERNIA REPAIR Right 03/10/2015  ? Procedure: OPEN RIGHT INGUINAL HERNIA REPAIR;  Surgeon: Johnathan Hausen, MD;  Location: [facility];  Service: General;  Laterality: Right;  With MESH  ? VASECTOMY    ? ?Patient Active Problem List  ? Diagnosis Date Noted  ? De Quervain's tenosynovitis, right 09/01/2021  ? Cerebellar ataxia in diseases classified elsewhere ([place]) 09/01/2021  ? Seborrheic keratosis 09/01/2021  ? BPH with urinary obstruction 09/01/2021  ? Age-related cognitive decline 08/05/2020  ? Chronic cluster headache, not intractable 03/16/2020  ? Uncontrolled REM sleep behavior disorder 03/16/2020  ? Routine general medical examination at a health care facility 01/18/2017  ? Essential hypertension 12/18/2014  ? Hyperlipidemia with target LDL less than 100 12/18/2014  ? First degree heart block 03/30/2013  ? Prediabetes 06/30/2010  ? GERD 06/26/2008  ? ERECTILE DYSFUNCTION, MILD 01/30/2008  ? VENTRICULAR HYPERTROPHY, LEFT 11/29/2006  ? COLON POLYP 09/06/2006  ? ASTHMA, EXERCISE INDUCED 09/06/2006  ? ? ?REFERRING DIAG: G32.81 (ICD-10-CM) - Cerebellar ataxia in diseases classified elsewhere [facility])  ? ?THERAPY DIAG:  ?Other abnormalities of gait and mobility ? ?Unspecified lack of coordination ? ?Pain in  thumb joint with movement of right hand ? ?PERTINENT HISTORY:  ?Over the last few years he has noticed worsening, abnormal behavior while he is in REM sleep.  He has occasionally knocked lamps off of a bed side table.  This recently worsened when he launched himself from the bed.  He says his wife has to sleep in another room.  He has also developed mild ataxia over the last few years.  ?  ? ?significant periodic limb movements of sleep  ? ? ?PRECAUTIONS: balance, fall ? ?SUBJECTIVE:  ?My calves were sore after last time. I have been doing the exercises.  ?PAIN:  ?Are you having pain? No ? ? ?OBJECTIVE: ? ?DIAGNOSTIC FINDINGS:  ?IMPRESSION:   ?1.  Couple punctate T2/FLAIR hyperintense foci in the subcortical or deep white matter of the hemispheres consistent with very minimal chronic microvascular ischemic change, typical for age. ?2.  Mild ethmoid, sphenoid and right frontal chronic sinusitis. ?3.  There were no acute findings and there was a normal enhancement pattern. ?  ? Will be having more scans in the coming weeks related to the study.  ?  ?PATIENT SURVEYS:  ?FOTO 64% ?  ?COGNITION: ?          Overall cognitive status: Within functional limits for tasks assessed               ?           ?  SENSATION: ?WFL ?  ?MUSCLE LENGTH: ?NT ?  ?POSTURE:  ?Mild swayback posture, forward head  ?  ?PALPATION: ?NT ?  ?LUMBAR ROM:  ?  ?Active  A/PROM  ?09/21/2021  ?Flexion WFL with bent knee   ?Extension Limited 50% mild dizziness in standing   ?Right lateral flexion    ?Left lateral flexion    ?Right rotation WNL  ?Left rotation WNL  ? (Blank rows = not tested) ?  ?  ?LE MMT: ?  ?MMT Right ?09/21/2021 Left ?09/21/2021  ?Hip flexion 4+/5 4+/5  ?Hip extension      ?Hip abduction      ?Hip adduction      ?Hip internal rotation      ?Hip external rotation      ?Knee flexion 4+/5 4+/5  ?Knee extension 5/5 5/5  ?Ankle dorsiflexion 5/5 5/5  ?Ankle plantarflexion      ?Ankle inversion      ?Ankle eversion      ? (Blank rows = not  tested) ?  ?WNL in UEs ?Grip strength Rt 68 lbs , Lt. 74 lbs  ?  ?  ?FUNCTIONAL TESTS:  ?5 times sit to stand: 7.5 sec ?30 seconds chair stand test ?Timed up and go (TUG): 8 sec, able to do manual, cognitive tasks as well within normal limits   ?Berg Balance Scale: 53/56 ?Dynamic Gait Index: NT ?Functional gait assessment: 22/30 ?  ?GAIT: ?Distance walked: 150 ?Assistive device utilized: None ?Level of assistance: independent ?Comments: no deviations with self selected gait speed  ?  ?  ?TODAY'S TREATMENT  ? ?[place] Adult PT Treatment:                                                DATE: 10/13/21 ?Therapeutic Exercise: ?Nustep L5 x 6 min ?STS 15# x 15 ?Neuromuscular re-ed: ?Tall kneeling narrow with weighted ball pullovers and diagonals, rotation challenges holding weighted ball ?Half kneeling -using wooden dowel to assist - narrow stance , EO, EC, rotation challenges ?Standing 3 way hip in SLS with red band x 10 each bilat-used foam roller for assist with maintaining balance.  ?SLS 8-10 sec best bilat ?Tandem stance -able to hold 60 sec each.  ? ?[place] Adult PT Treatment:                                                DATE: 10/07/21 ?Therapeutic Exercise: ?Nustep L6 x 5 min ?Weighted carry 25 lbs each UE x 180 feet ?Slant board stretch ?Airex heel and toe raises ?Neuromuscular re-ed: ?Hurdle step overs forward and side ways ?Standing cone taps x 3 x 5 each leg,  ?alternating and unilateral cone taps with SLS- then from airex ?Wooden rocker without UE ?Hip abduction x 15 light UE with SLS on foam oval x 12 each - light 2 finger touch ?Rotational stabilization red /green band single arm pull- feet in narrow then semitandem x 10 each   ? ?STS 10# x 15 ?SLS 4-5 sec best bilat ? ? ?[facility] Adult PT Treatment:  DATE: 10/04/21 ?Therapeutic Exercise: ?Recumbent bike 5 min L 3  ?Slant board stretch ?Neuromuscular re-ed: ?Squats on BOSU flat side ?Narrow on Airex EO 1 min, tandem  ?Tandem  on airex ?Hip abduction x 15 light UE  ?BOSU DF/PF and then Medial/lateral on each side ? ?Rotational stabilization red band single arm pull- feet in semitandem x 10 each  ?BOSU step ups x 10 each light touch ?BOSU normal stance each side balance , weight shifting ? ? ? ?[facility] Adult PT Treatment:                                                DATE: 09/30/21 ?Therapeutic Exercise: ?Recumbent bike 5 min L 2 small rolling hills  ?Weighted carry 25 lbs each UE x 180 feet ?Neuromuscular re-ed: ?Squats on Airex  x 15  ?Narrow on Airex EO 1 min, EC intermittent UE assist  ?Hip abduction x 15 light UE  ?Rockerboard hold parallel then added dynamic ankle motion in DF/PF and then Medial/lateral  ?Rocker board head turns, EC variations  ?Tandem blue bands extension alternating shoulders  ?High knees blue band isometric flexion shoulder  ?BOSU lunge alternating with countertop hold  ?BOSU rounded side static and then mini squat ?BOSU flat side up then mini squat . Increased time for gaining control and balance .  ?Semi-tandem rebounder  ? ?[facility] Adult PT Treatment:                                                DATE: 09/28/21 ?Therapeutic Exercise: ?Pilates Tower for LE/Core strength, postural strength, lumbopelvic disassociation and core control.  Exercises included: ?Supine Leg [place] ?Single leg 1 purple spring arcs and hip circles x 10 in each direction ?Double leg arcs, circles and squats  ?Palloff press 1 blue band x 15 ?Varied foot position as well for balance challenge  ?Neuromuscular re-ed: ?Standing balance activities to establish HEP ?Airex static, narrow with EO, EC ?Tandem with head turns, trunk rotation ?  ?  ?PATIENT EDUCATION:  ?Education details: POC, balance impairments ?Person educated: Patient ?Education method: Explanation ?Education comprehension: verbalized understanding ?  ?  ?HOME EXERCISE PROGRAM: ?Access Code: [email or web address] ?URL: [email or web address] ?Date: 09/30/2021 ?Prepared by: Raeford Razor ? ?Exercises ?- Tandem Stance  - 1 x daily - 7 x weekly - 1 sets - 5 reps - 30 hold ?- Romberg Stance  - 1 x daily - 7 x weekly - 2 sets - 10 reps - 30 hold ?- Romberg Stance with Eyes Closed  - 1 x daily -

## 2021-10-21 ENCOUNTER — Other Ambulatory Visit: Payer: Self-pay | Admitting: Internal Medicine

## 2021-10-21 ENCOUNTER — Other Ambulatory Visit: Payer: Self-pay | Admitting: Cardiovascular Disease

## 2021-10-21 DIAGNOSIS — E785 Hyperlipidemia, unspecified: Secondary | ICD-10-CM

## 2021-10-21 DIAGNOSIS — I10 Essential (primary) hypertension: Secondary | ICD-10-CM

## 2021-10-21 NOTE — Therapy (Signed)
?OUTPATIENT PHYSICAL THERAPY TREATMENT NOTE ? ? ?Patient Name: Clarence Ware ?MRN: [id number] ?DOB:1951/06/08, 71 y.o., male ?Today's Date: 10/24/2021 ? ?PCP: [name], MD ?REFERRING PROVIDER: [name], MD ? ? PT End of Session - 10/24/21 0936   ? ? Visit Number 7   ? Number of Visits 12   ? Date for PT Re-Evaluation 11/02/21   ? Authorization Type [employer]   ? PT Start Time 7893   ? PT Stop Time 1015   ? PT Time Calculation (min) 43 min   ? Activity Tolerance Patient tolerated treatment well   ? Behavior During Therapy [facility] for tasks assessed/performed   ? ?  ?  ? ?  ? ? ? ? ?Past Medical History:  ?Diagnosis Date  ? Allergy   ? First degree heart block   ? Hyperlipidemia   ? Hypertension   ? REM sleep behavior disorder   ? Substance abuse ([place])   ? ?Past Surgical History:  ?Procedure Laterality Date  ? INGUINAL HERNIA REPAIR Right 03/10/2015  ? Procedure: OPEN RIGHT INGUINAL HERNIA REPAIR;  Surgeon: Johnathan Hausen, MD;  Location: [facility];  Service: General;  Laterality: Right;  With MESH  ? VASECTOMY    ? ?Patient Active Problem List  ? Diagnosis Date Noted  ? De Quervain's tenosynovitis, right 09/01/2021  ? Cerebellar ataxia in diseases classified elsewhere ([place]) 09/01/2021  ? Seborrheic keratosis 09/01/2021  ? BPH with urinary obstruction 09/01/2021  ? Age-related cognitive decline 08/05/2020  ? Chronic cluster headache, not intractable 03/16/2020  ? Uncontrolled REM sleep behavior disorder 03/16/2020  ? Routine general medical examination at a health care facility 01/18/2017  ? Essential hypertension 12/18/2014  ? Hyperlipidemia with target LDL less than 100 12/18/2014  ? First degree heart block 03/30/2013  ? Prediabetes 06/30/2010  ? GERD 06/26/2008  ? ERECTILE DYSFUNCTION, MILD 01/30/2008  ? VENTRICULAR HYPERTROPHY, LEFT 11/29/2006  ? COLON POLYP 09/06/2006  ? ASTHMA, EXERCISE INDUCED 09/06/2006  ? ? 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 ?THERAPY DIAG:  ?Other abnormalities of gait and mobility ? ?Unspecified lack of coordination ? ?PERTINENT HISTORY:  ?Over the last few years he has noticed worsening, abnormal behavior while he is in REM sleep.  He has occasionally knocked lamps off of a bed side table.  This recently worsened when he launched himself from the bed.  He says his wife has to sleep in another room.  He has also developed mild ataxia over the last few years.  ?  ? ?significant periodic limb movements of sleep  ? ? ?PRECAUTIONS: balance, fall ? ?SUBJECTIVE:  ?My calves were sore after last time. I have been doing the exercises.  ?PAIN:  ?Are you having pain? No ? ? ?OBJECTIVE: ? ?DIAGNOSTIC FINDINGS:  ?IMPRESSION:   ?1.  Couple punctate T2/FLAIR hyperintense foci in the subcortical or deep white matter of the hemispheres consistent with very minimal chronic microvascular ischemic change, typical for age. ?2.  Mild ethmoid, sphenoid and right frontal chronic sinusitis. ?3.  There were no acute findings and there was a normal enhancement pattern. ?  ? Will be having more scans in the coming weeks related to the study.  ?  ?PATIENT SURVEYS:  ?EVAL FOTO 64% ?10/24/21: FOTO 59% (reports false sense of confidence initially ) ?  ?COGNITION: ?          Overall cognitive status: Within functional limits for tasks assessed               ?           ?  SENSATION: ?WFL ?  ?MUSCLE LENGTH: ?NT ?  ?POSTURE:  ?Mild swayback posture, forward head  ?  ?PALPATION: ?NT ?  ?LUMBAR ROM:  ?  ?Active  A/PROM  ?09/21/2021  ?Flexion WFL with bent knee   ?Extension Limited 50% mild dizziness in standing   ?Right lateral flexion    ?Left lateral flexion    ?Right rotation WNL  ?Left rotation WNL  ? (Blank rows = not tested) ?  ?  ?LE MMT: ?  ?MMT Right ?09/21/2021 Left ?09/21/2021  ?Hip flexion 4+/5 4+/5  ?Hip extension      ?Hip abduction      ?Hip adduction      ?Hip internal rotation      ?Hip external rotation      ?Knee flexion 4+/5 4+/5  ?Knee  extension 5/5 5/5  ?Ankle dorsiflexion 5/5 5/5  ?Ankle plantarflexion      ?Ankle inversion      ?Ankle eversion      ? (Blank rows = not tested) ?  ?WNL in UEs ?Grip strength Rt 68 lbs , Lt. 74 lbs  ?  ?  ?FUNCTIONAL TESTS:  ?5 times sit to stand: 7.5 sec ?30 seconds chair stand test ?Timed up and go (TUG): 8 sec, able to do manual, cognitive tasks as well within normal limits   ?Berg Balance Scale: 53/56 ?Dynamic Gait Index: NT ?Functional gait assessment: 22/30 ? 10/24/21: 28/30 ?  ?GAIT: ?Distance walked: 150 ?Assistive device utilized: None ?Level of assistance: independent ?Comments: no deviations with self selected gait speed  ?  ?  ?TODAY'S TREATMENT  ? ?[facility] Adult PT Treatment:                                                DATE: 10/24/21 ?Therapeutic Exercise: ?Elliptical ?Standing springboard: extension added head turns ?1/2 kneeling on Airex Palloff press and rotation with slasitx bands  ?Sit to stand overhead press to high knee march x 8 each side ?Modified burpees 2 levels on high mat table 2 rounds x 60 sec  ?Mountain climbers added rotation x 30 x 4 rounds  ?Therapeutic Activity: ?Functional Gait assessment 28/30.  See media.  Much improved  ?Self Care: ?POC and Parkinsons group, options ? ?[place] Adult PT Treatment:                                                DATE: 10/13/21 ?Therapeutic Exercise: ?Nustep L5 x 6 min ?STS 15# x 15 ?Neuromuscular re-ed: ?Tall kneeling narrow with weighted ball pullovers and diagonals, rotation challenges holding weighted ball ?Half kneeling -using wooden dowel to assist - narrow stance , EO, EC, rotation challenges ?Standing 3 way hip in SLS with red band x 10 each bilat-used foam roller for assist with maintaining balance.  ?SLS 8-10 sec best bilat ?Tandem stance -able to hold 60 sec each.  ? ?[place] Adult PT Treatment:                                                DATE: 10/07/21 ?Therapeutic Exercise: ?Nustep L6 x 5 min ?Weighted carry  25 lbs each UE x 180 feet ?Slant  board stretch ?Airex heel and toe raises ?Neuromuscular re-ed: ?Hurdle step overs forward and side ways ?Standing cone taps x 3 x 5 each leg,  ?alternating and unilateral cone taps with SLS- then from airex ?Wooden rocker without UE ?Hip abduction x 15 light UE with SLS on foam oval x 12 each - light 2 finger touch ?Rotational stabilization red /green band single arm pull- feet in narrow then semitandem x 10 each   ? ?STS 10# x 15 ?SLS 4-5 sec best bilat ? ? ?[facility] Adult PT Treatment:                                                DATE: 10/04/21 ?Therapeutic Exercise: ?Recumbent bike 5 min L 3  ?Slant board stretch ?Neuromuscular re-ed: ?Squats on BOSU flat side ?Narrow on Airex EO 1 min, tandem  ?Tandem on airex ?Hip abduction x 15 light UE  ?BOSU DF/PF and then Medial/lateral on each side ? ?Rotational stabilization red band single arm pull- feet in semitandem x 10 each  ?BOSU step ups x 10 each light touch ?BOSU normal stance each side balance , weight shifting ? ? ? ?[facility] Adult PT Treatment:                                                DATE: 09/30/21 ?Therapeutic Exercise: ?Recumbent bike 5 min L 2 small rolling hills  ?Weighted carry 25 lbs each UE x 180 feet ?Neuromuscular re-ed: ?Squats on Airex  x 15  ?Narrow on Airex EO 1 min, EC intermittent UE assist  ?Hip abduction x 15 light UE  ?Rockerboard hold parallel then added dynamic ankle motion in DF/PF and then Medial/lateral  ?Rocker board head turns, EC variations  ?Tandem blue bands extension alternating shoulders  ?High knees blue band isometric flexion shoulder  ?BOSU lunge alternating with countertop hold  ?BOSU rounded side static and then mini squat ?BOSU flat side up then mini squat . Increased time for gaining control and balance .  ?Semi-tandem rebounder  ?  ?  ?PATIENT EDUCATION:  ?Education details: POC, balance impairments ?Person educated: Patient ?Education method: Explanation ?Education comprehension: verbalized understanding ?  ?  ?HOME  EXERCISE PROGRAM: ?Access Code: [email or web address] ?URL: [email or web address] ?Date: 09/30/2021 ?Prepared by: Raeford Razor ? ?Exercises ?- Tandem Stance  - 1 x daily - 7 x weekly - 1 sets - 5 reps - 30 hold ?-

## 2021-10-24 ENCOUNTER — Ambulatory Visit: Payer: PPO | Admitting: Physical Therapy

## 2021-10-24 ENCOUNTER — Encounter: Payer: Self-pay | Admitting: Physical Therapy

## 2021-10-24 DIAGNOSIS — R2689 Other abnormalities of gait and mobility: Secondary | ICD-10-CM | POA: Diagnosis not present

## 2021-10-24 DIAGNOSIS — R279 Unspecified lack of coordination: Secondary | ICD-10-CM

## 2021-10-27 ENCOUNTER — Encounter: Payer: Self-pay | Admitting: Physical Therapy

## 2021-10-27 ENCOUNTER — Ambulatory Visit: Payer: PPO | Admitting: Physical Therapy

## 2021-10-27 DIAGNOSIS — R2689 Other abnormalities of gait and mobility: Secondary | ICD-10-CM

## 2021-10-27 DIAGNOSIS — R279 Unspecified lack of coordination: Secondary | ICD-10-CM

## 2021-10-27 NOTE — Therapy (Signed)
?OUTPATIENT PHYSICAL THERAPY TREATMENT NOTE ? ? ?Patient Name: Clarence Ware ?MRN: [id number] ?DOB:1950/07/20, 71 y.o., male ?Today's Date: 10/27/2021 ? ?PCP: [name], MD ?REFERRING PROVIDER: [name], MD ? ? PT End of Session - 10/27/21 4098   ? ? Visit Number 8   ? Number of Visits 12   ? Date for PT Re-Evaluation 11/02/21   ? Authorization Type [employer]   ? PT Start Time [phone]   ? PT Stop Time 1016   ? PT Time Calculation (min) 42 min   ? Activity Tolerance Patient tolerated treatment well   ? Behavior During Therapy [facility] for tasks assessed/performed   ? ?  ?  ? ?  ? ? ? ? ?Past Medical History:  ?Diagnosis Date  ? Allergy   ? First degree heart block   ? Hyperlipidemia   ? Hypertension   ? REM sleep behavior disorder   ? Substance abuse ([place])   ? ?Past Surgical History:  ?Procedure Laterality Date  ? INGUINAL HERNIA REPAIR Right 03/10/2015  ? Procedure: OPEN RIGHT INGUINAL HERNIA REPAIR;  Surgeon: Johnathan Hausen, MD;  Location: [facility];  Service: General;  Laterality: Right;  With MESH  ? VASECTOMY    ? ?Patient Active Problem List  ? Diagnosis Date Noted  ? De Quervain's tenosynovitis, right 09/01/2021  ? Cerebellar ataxia in diseases classified elsewhere ([place]) 09/01/2021  ? Seborrheic keratosis 09/01/2021  ? BPH with urinary obstruction 09/01/2021  ? Age-related cognitive decline 08/05/2020  ? Chronic cluster headache, not intractable 03/16/2020  ? Uncontrolled REM sleep behavior disorder 03/16/2020  ? Routine general medical examination at a health care facility 01/18/2017  ? Essential hypertension 12/18/2014  ? Hyperlipidemia with target LDL less than 100 12/18/2014  ? First degree heart block 03/30/2013  ? Prediabetes 06/30/2010  ? GERD 06/26/2008  ? ERECTILE DYSFUNCTION, MILD 01/30/2008  ? VENTRICULAR HYPERTROPHY, LEFT 11/29/2006  ? COLON POLYP 09/06/2006  ? ASTHMA, EXERCISE INDUCED 09/06/2006  ? ? 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 ?THERAPY DIAG:  ?Other abnormalities of gait and mobility ? ?Unspecified lack of coordination ? ?PERTINENT HISTORY:  ?Over the last few years he has noticed worsening, abnormal behavior while he is in REM sleep.  He has occasionally knocked lamps off of a bed side table.  This recently worsened when he launched himself from the bed.  He says his wife has to sleep in another room.  He has also developed mild ataxia over the last few years.  ?  ? ?significant periodic limb movements of sleep  ? ? ?PRECAUTIONS: balance, fall ? ?SUBJECTIVE:  ?No complaints today.  ?PAIN:  ?Are you having pain? No ? ? ?OBJECTIVE: ? ?DIAGNOSTIC FINDINGS:  ?IMPRESSION:   ?1.  Couple punctate T2/FLAIR hyperintense foci in the subcortical or deep white matter of the hemispheres consistent with very minimal chronic microvascular ischemic change, typical for age. ?2.  Mild ethmoid, sphenoid and right frontal chronic sinusitis. ?3.  There were no acute findings and there was a normal enhancement pattern. ?  ? Will be having more scans in the coming weeks related to the study.  ?  ?PATIENT SURVEYS:  ?EVAL FOTO 64% ?10/24/21: FOTO 59% (reports false sense of confidence initially ) ?  ?COGNITION: ?          Overall cognitive status: Within functional limits for tasks assessed               ?           ?  SENSATION: ?WFL ?  ?MUSCLE LENGTH: ?NT ?  ?POSTURE:  ?Mild swayback posture, forward head  ?  ?PALPATION: ?NT ?  ?LUMBAR ROM:  ?  ?Active  A/PROM  ?09/21/2021  ?Flexion WFL with bent knee   ?Extension Limited 50% mild dizziness in standing   ?Right lateral flexion    ?Left lateral flexion    ?Right rotation WNL  ?Left rotation WNL  ? (Blank rows = not tested) ?  ?  ?LE MMT: ?  ?MMT Right ?09/21/2021 Left ?09/21/2021  ?Hip flexion 4+/5 4+/5  ?Hip extension      ?Hip abduction      ?Hip adduction      ?Hip internal rotation      ?Hip external rotation      ?Knee flexion 4+/5 4+/5  ?Knee extension 5/5 5/5  ?Ankle dorsiflexion 5/5 5/5   ?Ankle plantarflexion      ?Ankle inversion      ?Ankle eversion      ? (Blank rows = not tested) ?  ?WNL in UEs ?Grip strength Rt 68 lbs , Lt. 74 lbs  ?  ?  ?FUNCTIONAL TESTS:  ?5 times sit to stand: 7.5 sec ?30 seconds chair stand test ?Timed up and go (TUG): 8 sec, able to do manual, cognitive tasks as well within normal limits   ?Berg Balance Scale: 53/56 ?Dynamic Gait Index: NT ?Functional gait assessment: 22/30 ? 10/24/21: 28/30 ?  ?GAIT: ?Distance walked: 150 ?Assistive device utilized: None ?Level of assistance: independent ?Comments: no deviations with self selected gait speed  ?  ?  ?TODAY'S TREATMENT  ? ? ?[place] Adult PT Treatment:                                                DATE: 10/27/21 ?Therapeutic Exercise: ?Elliptical 6 min L 12 ramp, level 9 resistance ?Pilates Reformer used for LE/core strength, postural strength, lumbopelvic disassociation and core control.  Exercises included: ?Footwork 2 red 1 green heel and toes in parallel and then turnout   ?Single leg x 15 each   ?Bridging 2 red 1 blue then added all springs for ease ?Articulating x 10  ?Supine Arms 1 red 1 yellow  ?   Arcs and added single leg knee extension  ?Standing Scooter 1 red  ?Lunge, hip flexor stretch ?Each side , able to take hands off to 0-1 support on footbar  ? ? ?[facility] Adult PT Treatment:                                                DATE: 10/24/21 ?Therapeutic Exercise: ?Elliptical ?Standing springboard: extension added head turns ?1/2 kneeling on Airex Palloff press and rotation with slasitx bands  ?Sit to stand overhead press to high knee march x 8 each side ?Modified burpees 2 levels on high mat table 2 rounds x 60 sec  ?Mountain climbers added rotation x 30 x 4 rounds  ?Therapeutic Activity: ?Functional Gait assessment 28/30.  See media.  Much improved  ?Self Care: ?POC and Parkinsons group, options ? ?[place] Adult PT Treatment:  DATE: 10/13/21 ?Therapeutic Exercise: ?Nustep L5 x  6 min ?STS 15# x 15 ?Neuromuscular re-ed: ?Tall kneeling narrow with weighted ball pullovers and diagonals, rotation challenges holding weighted ball ?Half kneeling -using wooden dowel to assist - narrow stance , EO, EC, rotation challenges ?Standing 3 way hip in SLS with red band x 10 each bilat-used foam roller for assist with maintaining balance.  ?SLS 8-10 sec best bilat ?Tandem stance -able to hold 60 sec each.  ? ?[place] Adult PT Treatment:                                                DATE: 10/07/21 ?Therapeutic Exercise: ?Nustep L6 x 5 min ?Weighted carry 25 lbs each UE x 180 feet ?Slant board stretch ?Airex heel and toe raises ?Neuromuscular re-ed: ?Hurdle step overs forward and side ways ?Standing cone taps x 3 x 5 each leg,  ?alternating and unilateral cone taps with SLS- then from airex ?Wooden rocker without UE ?Hip abduction x 15 light UE with SLS on foam oval x 12 each - light 2 finger touch ?Rotational stabilization red /green band single arm pull- feet in narrow then semitandem x 10 each   ? ?STS 10# x 15 ?SLS 4-5 sec best bilat ? ? ?[facility] Adult PT Treatment:                                                DATE: 10/04/21 ?Therapeutic Exercise: ?Recumbent bike 5 min L 3  ?Slant board stretch ?Neuromuscular re-ed: ?Squats on BOSU flat side ?Narrow on Airex EO 1 min, tandem  ?Tandem on airex ?Hip abduction x 15 light UE  ?BOSU DF/PF and then Medial/lateral on each side ? ?Rotational stabilization red band single arm pull- feet in semitandem x 10 each  ?BOSU step ups x 10 each light touch ?BOSU normal stance each side balance , weight shifting ? ? ? ?[facility] Adult PT Treatment:                                                DATE: 09/30/21 ?Therapeutic Exercise: ?Recumbent bike 5 min L 2 small rolling hills  ?Weighted carry 25 lbs each UE x 180 feet ?Neuromuscular re-ed: ?Squats on Airex  x 15  ?Narrow on Airex EO 1 min, EC intermittent UE assist  ?Hip abduction x 15 light UE  ?Rockerboard hold parallel then  added dynamic ankle motion in DF/PF and then Medial/lateral  ?Rocker board head turns, EC variations  ?Tandem blue bands extension alternating shoulders  ?High knees blue band isometric flexion shoulder  ?BOSU l

## 2021-10-30 NOTE — Therapy (Signed)
?OUTPATIENT PHYSICAL THERAPY TREATMENT NOTE ? ? ?Patient Name: Clarence Ware ?MRN: [id number] ?DOB:[birth year], 71 y.o., male ?Today's Date: 10/31/2021 ? ?PCP: [name], MD ?REFERRING PROVIDER: [name], MD ? ? PT End of Session - 10/31/21 1551   ? ? Visit Number 9   ? Number of Visits 12   ? Date for PT Re-Evaluation 11/02/21   ? Authorization Type [employer]   ? PT Start Time 1550   ? PT Stop Time 1628   ? PT Time Calculation (min) 38 min   ? Activity Tolerance Patient tolerated treatment well   ? Behavior During Therapy [facility] for tasks assessed/performed   ? ?  ?  ? ?  ? ? ? ? ? ?Past Medical History:  ?Diagnosis Date  ? Allergy   ? First degree heart block   ? Hyperlipidemia   ? Hypertension   ? REM sleep behavior disorder   ? Substance abuse ([place])   ? ?Past Surgical History:  ?Procedure Laterality Date  ? INGUINAL HERNIA REPAIR Right 03/10/2015  ? Procedure: OPEN RIGHT INGUINAL HERNIA REPAIR;  Surgeon: Johnathan Hausen, MD;  Location: [facility];  Service: General;  Laterality: Right;  With MESH  ? VASECTOMY    ? ?Patient Active Problem List  ? Diagnosis Date Noted  ? De Quervain's tenosynovitis, right 09/01/2021  ? Cerebellar ataxia in diseases classified elsewhere ([place]) 09/01/2021  ? Seborrheic keratosis 09/01/2021  ? BPH with urinary obstruction 09/01/2021  ? Age-related cognitive decline 08/05/2020  ? Chronic cluster headache, not intractable 03/16/2020  ? Uncontrolled REM sleep behavior disorder 03/16/2020  ? Routine general medical examination at a health care facility 01/18/2017  ? Essential hypertension 12/18/2014  ? Hyperlipidemia with target LDL less than 100 12/18/2014  ? First degree heart block 03/30/2013  ? Prediabetes 06/30/2010  ? GERD 06/26/2008  ? ERECTILE DYSFUNCTION, MILD 01/30/2008  ? VENTRICULAR HYPERTROPHY, LEFT 11/29/2006  ? COLON POLYP 09/06/2006  ? ASTHMA, EXERCISE INDUCED 09/06/2006  ? ? 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 ?THERAPY DIAG:  ?Other abnormalities of gait and mobility ? ?Unspecified lack of coordination ? ?PERTINENT HISTORY:  ?Over the last few years he has noticed worsening, abnormal behavior while he is in REM sleep.  He has occasionally knocked lamps off of a bed side table.  This recently worsened when he launched himself from the bed.  He says his wife has to sleep in another room.  He has also developed mild ataxia over the last few years.  ?  ? ?significant periodic limb movements of sleep  ? ? ?PRECAUTIONS: balance, fall ? ?SUBJECTIVE:  ?No complaints today.  Walked about 3 miles today .  Reports getting tripped up with Rt foot catching a step primarily.  Occasionally he will catch a toe under the rug.  ?PAIN:  ?Are you having pain? No ? ? ?OBJECTIVE: ? ?DIAGNOSTIC FINDINGS:  ?IMPRESSION:   ?1.  Couple punctate T2/FLAIR hyperintense foci in the subcortical or deep white matter of the hemispheres consistent with very minimal chronic microvascular ischemic change, typical for age. ?2.  Mild ethmoid, sphenoid and right frontal chronic sinusitis. ?3.  There were no acute findings and there was a normal enhancement pattern. ?  ? Will be having more scans in the coming weeks related to the study.  ?  ?PATIENT SURVEYS:  ?EVAL FOTO 64% ?10/24/21: FOTO 59% (reports false sense of confidence initially ) ?  ?COGNITION: ?  Overall cognitive status: Within functional limits for tasks assessed               ?           ?SENSATION: ?WFL ?  ?MUSCLE LENGTH: ?NT ?  ?POSTURE:  ?Mild swayback posture, forward head  ?  ?PALPATION: ?NT ?  ?LUMBAR ROM:  ?  ?Active  A/PROM  ?09/21/2021  ?Flexion WFL with bent knee   ?Extension Limited 50% mild dizziness in standing   ?Right lateral flexion    ?Left lateral flexion    ?Right rotation WNL  ?Left rotation WNL  ? (Blank rows = not tested) ?  ?  ?LE MMT: ?  ?MMT Right ?09/21/2021 Left ?09/21/2021  ?Hip flexion 4+/5 4+/5  ?Hip extension      ?Hip abduction      ?Hip  adduction      ?Hip internal rotation      ?Hip external rotation      ?Knee flexion 4+/5 4+/5  ?Knee extension 5/5 5/5  ?Ankle dorsiflexion 5/5 5/5  ?Ankle plantarflexion      ?Ankle inversion      ?Ankle eversion      ? (Blank rows = not tested) ?  ?WNL in UEs ?Grip strength Rt 68 lbs , Lt. 74 lbs  ?  ?  ?FUNCTIONAL TESTS:  ?5 times sit to stand: 7.5 sec ?30 seconds chair stand test ?Timed up and go (TUG): 8 sec, able to do manual, cognitive tasks as well within normal limits   ?Berg Balance Scale: 53/56 ?Dynamic Gait Index: NT ?Functional gait assessment: 22/30 ? 10/24/21: 28/30 ?  ?GAIT: ?Distance walked: 150 ?Assistive device utilized: None ?Level of assistance: independent ?Comments: no deviations with self selected gait speed  ?  ?  ?TODAY'S TREATMENT  ? ?[facility] Adult PT Treatment:                                                DATE: 10/31/21 ?Neuromuscular re-ed ?Freemotion for weighted walking FW, back x 5 (5 plates)  ?Sidestepping 4 plates x 5  ?Diagonal lunging each direction x 10  ?Tandem stance rebounder 1 min each  ?SLS red ball pass  (anti-rotate) 2 lbs very challenging, had to place a toe down for stability  ?Sidefacing rebounder (increased dynamic) rotation x 1 min  ?Corner SLS with small head turns , on and off Airex ?Tandem on Airex  ?Discussed ankle strength (5/5) and coordination  ? ? ?[place] Adult PT Treatment:                                                DATE: 10/27/21 ?Therapeutic Exercise: ?Elliptical 6 min L 12 ramp, level 9 resistance ?Pilates Reformer used for LE/core strength, postural strength, lumbopelvic disassociation and core control.  Exercises included: ?Footwork 2 red 1 green heel and toes in parallel and then turnout   ?Single leg x 15 each   ?Bridging 2 red 1 blue then added all springs for ease ?Articulating x 10  ?Supine Arms 1 red 1 yellow  ?   Arcs and added single leg knee extension  ?Standing Scooter 1 red  ?Lunge, hip flexor stretch ?Each side , able to take hands off to  0-1  support on footbar  ? ? ?[place] Adult PT Treatment:                                                DATE: 10/24/21 ?Therapeutic Exercise: ?Elliptical ?Standing springboard: extension added head turns ?1/2 kneeling on Airex Palloff press and rotation with slasitx bands  ?Sit to stand overhead press to high knee march x 8 each side ?Modified burpees 2 levels on high mat table 2 rounds x 60 sec  ?Mountain climbers added rotation x 30 x 4 rounds  ?Therapeutic Activity: ?Functional Gait assessment 28/30.  See media.  Much improved  ?Self Care: ?POC and Parkinsons group, options ? ?[place] Adult PT Treatment:                                                DATE: 10/13/21 ?Therapeutic Exercise: ?Nustep L5 x 6 min ?STS 15# x 15 ?Neuromuscular re-ed: ?Tall kneeling narrow with weighted ball pullovers and diagonals, rotation challenges holding weighted ball ?Half kneeling -using wooden dowel to assist - narrow stance , EO, EC, rotation challenges ?Standing 3 way hip in SLS with red band x 10 each bilat-used foam roller for assist with maintaining balance.  ?SLS 8-10 sec best bilat ?Tandem stance -able to hold 60 sec each.  ? ?[place] Adult PT Treatment:                                                DATE: 10/07/21 ?Therapeutic Exercise: ?Nustep L6 x 5 min ?Weighted carry 25 lbs each UE x 180 feet ?Slant board stretch ?Airex heel and toe raises ?Neuromuscular re-ed: ?Hurdle step overs forward and side ways ?Standing cone taps x 3 x 5 each leg,  ?alternating and unilateral cone taps with SLS- then from airex ?Wooden rocker without UE ?Hip abduction x 15 light UE with SLS on foam oval x 12 each - light 2 finger touch ?Rotational stabilization red /green band single arm pull- feet in narrow then semitandem x 10 each   ? ?STS 10# x 15 ?SLS 4-5 sec best bilat ? ? ?[facility] Adult PT Treatment:                                                DATE: 10/04/21 ?Therapeutic Exercise: ?Recumbent bike 5 min L 3  ?Slant board stretch ?Neuromuscular  re-ed: ?Squats on BOSU flat side ?Narrow on Airex EO 1 min, tandem  ?Tandem on airex ?Hip abduction x 15 light UE  ?BOSU DF/PF and then Medial/lateral on each side ? ?Rotational stabilization red band single arm pull- f

## 2021-10-31 ENCOUNTER — Ambulatory Visit: Payer: PPO | Admitting: Physical Therapy

## 2021-10-31 DIAGNOSIS — R2689 Other abnormalities of gait and mobility: Secondary | ICD-10-CM | POA: Diagnosis not present

## 2021-10-31 DIAGNOSIS — R279 Unspecified lack of coordination: Secondary | ICD-10-CM

## 2021-11-02 ENCOUNTER — Ambulatory Visit: Payer: PPO | Admitting: Physical Therapy

## 2021-11-02 DIAGNOSIS — R279 Unspecified lack of coordination: Secondary | ICD-10-CM

## 2021-11-02 DIAGNOSIS — R2689 Other abnormalities of gait and mobility: Secondary | ICD-10-CM | POA: Diagnosis not present

## 2021-11-02 NOTE — Therapy (Signed)
?OUTPATIENT PHYSICAL THERAPY TREATMENT NOTE ?DISCHARGE ? ? ?Progress Note ?Reporting Period 09/21/21 to 11/02/21 ? ?See note below for Objective Data and Assessment of Progress/Goals.  ? ?  ? ?Patient Name: Clarence Ware ?MRN: [id number] ?DOB:12-15-1950, 71 y.o., male ?Today's Date: 11/02/2021 ? ?PCP: [name], MD ?REFERRING PROVIDER: [name], MD ? ? PT End of Session - 11/02/21 1158   ? ? Visit Number 10   ? Number of Visits 12   ? Date for PT Re-Evaluation 11/02/21   ? Authorization Type [employer]   ? PT Start Time 1102   ? PT Stop Time 1145   ? PT Time Calculation (min) 43 min   ? Activity Tolerance Patient tolerated treatment well   ? Behavior During Therapy [facility] for tasks assessed/performed   ? ?  ?  ? ?  ? ? ? ? ? ? ?Past Medical History:  ?Diagnosis Date  ? Allergy   ? First degree heart block   ? Hyperlipidemia   ? Hypertension   ? REM sleep behavior disorder   ? Substance abuse ([place])   ? ?Past Surgical History:  ?Procedure Laterality Date  ? INGUINAL HERNIA REPAIR Right 03/10/2015  ? Procedure: OPEN RIGHT INGUINAL HERNIA REPAIR;  Surgeon: Johnathan Hausen, MD;  Location: [facility];  Service: General;  Laterality: Right;  With MESH  ? VASECTOMY    ? ?Patient Active Problem List  ? Diagnosis Date Noted  ? De Quervain's tenosynovitis, right 09/01/2021  ? Cerebellar ataxia in diseases classified elsewhere ([place]) 09/01/2021  ? Seborrheic keratosis 09/01/2021  ? BPH with urinary obstruction 09/01/2021  ? Age-related cognitive decline 08/05/2020  ? Chronic cluster headache, not intractable 03/16/2020  ? Uncontrolled REM sleep behavior disorder 03/16/2020  ? Routine general medical examination at a health care facility 01/18/2017  ? Essential hypertension 12/18/2014  ? Hyperlipidemia with target LDL less than 100 12/18/2014  ? First degree heart block 03/30/2013  ? Prediabetes 06/30/2010  ? GERD 06/26/2008  ? ERECTILE DYSFUNCTION, MILD 01/30/2008  ? VENTRICULAR HYPERTROPHY, LEFT 11/29/2006  ?  COLON POLYP 09/06/2006  ? ASTHMA, EXERCISE INDUCED 09/06/2006  ? ? ?REFERRING DIAG: G32.81 (ICD-10-CM) - Cerebellar ataxia in diseases classified elsewhere [facility])  ? ?THERAPY DIAG:  ?Other abnormalities of gait and mobility ? ?Unspecified lack of coordination ? ?PERTINENT HISTORY:  ?Over the last few years he has noticed worsening, abnormal behavior while he is in REM sleep.  He has occasionally knocked lamps off of a bed side table.  This recently worsened when he launched himself from the bed.  He says his wife has to sleep in another room.  He has also developed mild ataxia over the last few years.  ?  ? ?significant periodic limb movements of sleep  ? ? ?PRECAUTIONS: balance, fall ? ?SUBJECTIVE: ?Walked this AM.  Ready to finish PT ?PAIN:  ?Are you having pain? No ? ? ?OBJECTIVE: ? ?DIAGNOSTIC FINDINGS:  ?IMPRESSION:   ?1.  Couple punctate T2/FLAIR hyperintense foci in the subcortical or deep white matter of the hemispheres consistent with very minimal chronic microvascular ischemic change, typical for age. ?2.  Mild ethmoid, sphenoid and right frontal chronic sinusitis. ?3.  There were no acute findings and there was a normal enhancement pattern. ?  ? Will be having more scans in the coming weeks related to the study.  ?  ?PATIENT SURVEYS:  ?EVAL FOTO 64% ?10/24/21: FOTO 59% (reports false sense of confidence initially ) ?  ?COGNITION: ?  Overall cognitive status: Within functional limits for tasks assessed               ?           ?SENSATION: ?WFL ?  ?MUSCLE LENGTH: ?NT ?  ?POSTURE:  ?Mild swayback posture, forward head  ?  ?PALPATION: ?NT ?  ?LUMBAR ROM:  ?  ?Active  A/PROM  ?09/21/2021  ?Flexion WFL with bent knee   ?Extension Limited 50% mild dizziness in standing   ?Right lateral flexion    ?Left lateral flexion    ?Right rotation WNL  ?Left rotation WNL  ? (Blank rows = not tested) ?  ?  ?LE MMT: ?  ?MMT Right ?09/21/2021 Left ?09/21/2021  ?Hip flexion 4+/5 4+/5  ?Hip extension      ?Hip abduction       ?Hip adduction      ?Hip internal rotation      ?Hip external rotation      ?Knee flexion 4+/5 4+/5  ?Knee extension 5/5 5/5  ?Ankle dorsiflexion 5/5 5/5  ?Ankle plantarflexion      ?Ankle inversion      ?Ankle eversion      ? (Blank rows = not tested) ?  ?WNL in UEs ?Grip strength Rt 68 lbs , Lt. 74 lbs  ?  ?  ?FUNCTIONAL TESTS:  ?5 times sit to stand: 7.5 sec ?30 seconds chair stand test ?Timed up and go (TUG): 8 sec, able to do manual, cognitive tasks as well within normal limits   ?Berg Balance Scale: 53/56 ?Dynamic Gait Index: NT ?Functional gait assessment: 22/30 ? 10/24/21: 28/30 ?  ?GAIT: ?Distance walked: 150 ?Assistive device utilized: None ?Level of assistance: independent ?Comments: no deviations with self selected gait speed  ?  ?  ?TODAY'S TREATMENT  ? ? ?[place] Adult PT Treatment:                                                DATE: 11/02/21 ?Therapeutic Exercise: ?Standing shoulder row, extension with balance challenges black band  ?Narrow, tandem and EC ?Palloff press black in mini squat x 10  ?BOSU alternating lunge ?BOSU R/L lunge with 10 lbs KB x 5 each side ?BOSU forearm plank added lateral toe taps, then hip extension   ? ?Neuromuscular re-ed: ?Dynamic gait activities: tandem, high knee march, head turns, head nods, retro gait ?Each done for about 60 feet, min A at times  ?Rockerboard single leg ankle DF/PF, cervical motions, EO, EC ?Self Care: ?BP 132/86, was lightheaded after planks ?  ?[facility] Adult PT Treatment:                                                DATE: 10/31/21 ?Neuromuscular re-ed ?Freemotion for weighted walking FW, back x 5 (5 plates)  ?Sidestepping 4 plates x 5  ?Diagonal lunging each direction x 10  ?Tandem stance rebounder 1 min each  ?SLS red ball pass  (anti-rotate) 2 lbs very challenging, had to place a toe down for stability  ?Sidefacing rebounder (increased dynamic) rotation x 1 min  ?Corner SLS with small head turns , on and off Airex ?Tandem on Airex  ?Discussed ankle  strength (5/5) and coordination  ? ? ?[facility]  Adult PT Treatment:                                                DATE: 10/27/21 ?Therapeutic Exercise: ?Elliptical 6 min L 12 ramp, level 9 resistance ?Pilates Reformer used for LE/core strength, postural strength, lumbopelvic disassociation and core control.  Exercises included: ?Footwork 2 red 1 green heel and toes in parallel and then turnout   ?Single leg x 15 each   ?Bridging 2 red 1 blue then added all springs for ease ?Articulating x 10  ?Supine Arms 1 red 1 yellow  ?   Arcs and added single leg knee extension  ?Standing Scooter 1 red  ?Lunge, hip flexor stretch ?Each side , able to take hands off to 0-1 support on footbar  ? ? ?[facility] Adult PT Treatment:                                                DATE: 10/24/21 ?Therapeutic Exercise: ?Elliptical ?Standing springboard: extension added head turns ?1/2 kneeling on Airex Palloff press and rotation with slasitx bands  ?Sit to stand overhead press to high knee march x 8 each side ?Modified burpees 2 levels on high mat table 2 rounds x 60 sec  ?Mountain climbers added rotation x 30 x 4 rounds  ?Therapeutic Activity: ?Functional Gait assessment 28/30.  See media.  Much improved  ?Self Care: ?POC and Parkinsons group, options ? ?PATIENT EDUCATION:  ?Education details: balance strategies, DC  ?Person educated: Patient ?Education method: Explanation ?Education comprehension: verbalized understanding ?  ?  ?HOME EXERCISE PROGRAM: ?Access Code: [email or web address] ?URL: [email or web address] ?Date: 09/30/2021 ?Prepared by: Raeford Razor ? ?Access Code: [email or web address] ?URL: [email or web address] ?Date: 10/31/2021 ?Prepared by: Raeford Razor ? ?Exercises ?- Romberg Stance  - 1 x daily - 7 x weekly - 1 sets - 1 reps - 60 hold ?- Romberg Stance with Eyes Closed  - 1 x daily - 7 x weekly - 1 sets - 1 reps - 30-60 hold ?- Tandem Stance  - 1 x daily - 7 x weekly - 1 sets - 4 reps - 30 hold ?- Romberg Stance on Foam Pad with  Head Rotation  - 1 x daily - 7 x weekly - 1 sets - 10 reps ?- Standing Anti-Rotation Press with Anchored Resistance  - 1 x daily - 7 x weekly - 2 sets - 10 reps - 5 hold ?- Single Leg Balance on Balance Tra

## 2021-11-09 ENCOUNTER — Telehealth: Payer: Self-pay

## 2021-11-09 NOTE — Progress Notes (Signed)
? ? ?Chronic Care Management ?Pharmacy Assistant  ? ?Name: Clarence Ware  MRN: [id number] DOB: 1951/07/05 ? ? ?Reason for Encounter: Disease State-General ?  ?Recent office visits:  ?09/01/21 Clarence Lima, MD-PCP (Hypertension) Blood work ordered and Ambulatory referrals made to Occupational therapy, Physical therapy, and Dermatology; Med changes: Alfuzosin 10 mg, Olmesartan medoxomil 40 mg ? ?Recent consult visits:  ?09/19/21 Clarence Sine, MD-Cardiology (Hypertension) Orders:EKG;No med changes ? ?Hospital visits:  ?None in previous 6 months ? ?Medications: ?Outpatient Encounter Medications as of 11/09/2021  ?Medication Sig  ? aspirin EC 81 MG tablet Take 81 mg by mouth daily.  ? atorvastatin (LIPITOR) 40 MG tablet TAKE ONE TABLET BY MOUTH ONCE DAILY  ? cetirizine (ZYRTEC) 10 MG tablet Take 10 mg by mouth as directed. PRN  ? Cholecalciferol (VITAMIN D) 2000 units CAPS Take 1 capsule by mouth daily.  ? ezetimibe (ZETIA) 10 MG tablet TAKE ONE TABLET BY MOUTH ONCE DAILY  ? melatonin 3 MG TABS tablet Take 2 tablets (6 mg total) by mouth at bedtime. (Patient taking differently: Take 3 mg by mouth at bedtime.)  ? olmesartan (BENICAR) 40 MG tablet Take 1 tablet (40 mg total) by mouth daily.  ? PFIZER COVID-19 VAC BIVALENT injection   ? ?No facility-administered encounter medications on file as of 11/09/2021.  ? ?Contacted Clarence Ware for [employer] Review Call ? ? ?Chart Review: ? ?Have there been any documented new, changed, or discontinued medications since last visit? Yes (If yes, include name, dose, frequency, date) Med changes: Alfuzosin 10 mg, Olmesartan medoxomil 40 mg ?Has there been any documented recent hospitalizations or ED visits since last visit with Clinical Pharmacist? No ?Brief Summary (including medication and/or Diagnosis changes): ? ? ?Adherence Review: ? ?Does the Clinical Pharmacist Assistant have access to adherence rates? Yes ?Adherence rates for STAR metric medications (List medication(s)/day  supply/ last 2 fill dates).Down below ?Adherence rates for medications indicated for disease state being reviewed (List medication(s)/day supply/ last 2 fill dates).Down below ?Does the patient have >5 day gap between last estimated fill dates for any of the above medications or other medication gaps? Yes, olmesartan 40 mg ?Reason for medication gaps. Patient is taking 1/2 tablet ? ? ?Disease State Questions: ? ?Able to connect with Patient? Yes ?Did patient have any problems with their health recently? No ?Note problems and Concerns: ?Have you had any admissions or emergency room visits or worsening of your condition(s) since last visit? No ?Details of ED visit, hospital visit and/or worsening condition(s): ?Have you had any visits with new specialists or providers since your last visit? No ?Explain: ?Have you had any new health care problem(s) since your last visit? Yes ?New problem(s) reported: Lightheadedness ?Have you run out of any of your medications since you last spoke with clinical pharmacist? No ?What caused you to run out of your medications? ?Are there any medications you are not taking as prescribed? Yes ?What kept you from taking your medications as prescribed?Patient states that he is taking half tablet of olmesartan and he stopped taking alfuzosin because he was getting lightheaded, but has now started back taking ?Are you having any issues or side effects with your medications? Yes ?Note of issues or side effects:light headed ?Do you have any other health concerns or questions you want to discuss with your Clinical Pharmacist before your next visit? No ?Note additional concerns and questions from Patient. ?Are there any health concerns that you feel we can do a better job addressing? No ?Note Patient's  response. ?Are you having any problems with any of the following since the last visit: (select all that apply) ? None ? Details: ?12. Any falls since last visit? No ? Details: ?13. Any increased or  uncontrolled pain since last visit? No ? Details: ?14. Next visit Type: telephone ?      Visit with:Clinical Pharmacist ?       Date:01/16/22 ?       Time:1pm ? ?15. Additional Details? No  ?  ?Care Gaps: ?Colonoscopy-03/19/15 ?Diabetic Foot Exam-NA ?Ophthalmology-NA ?Dexa Scan - NA ?Annual Well Visit - NA ?Micro albumin-NA ?Hemoglobin A1c- 09/01/21 ? ?Star Rating Drugs: ?Atorvastatin 40 mg-last fill 10/31/21 90 ds ?Olmesartan 40 mg-last fill 09/05/21 62 ds ? ?Clarence Ware ?Clinical Pharmacist Assistant ?[phone]  ?

## 2021-12-13 ENCOUNTER — Ambulatory Visit (INDEPENDENT_AMBULATORY_CARE_PROVIDER_SITE_OTHER): Payer: PPO | Admitting: Internal Medicine

## 2021-12-13 ENCOUNTER — Encounter: Payer: Self-pay | Admitting: Internal Medicine

## 2021-12-13 VITALS — BP 114/66 | HR 63 | Temp 97.9°F | Ht 74.5 in | Wt 197.0 lb

## 2021-12-13 DIAGNOSIS — S8012XA Contusion of left lower leg, initial encounter: Secondary | ICD-10-CM | POA: Diagnosis not present

## 2021-12-13 DIAGNOSIS — M170 Bilateral primary osteoarthritis of knee: Secondary | ICD-10-CM

## 2021-12-13 DIAGNOSIS — M79662 Pain in left lower leg: Secondary | ICD-10-CM | POA: Diagnosis not present

## 2021-12-13 DIAGNOSIS — I10 Essential (primary) hypertension: Secondary | ICD-10-CM

## 2021-12-13 DIAGNOSIS — S8010XA Contusion of unspecified lower leg, initial encounter: Secondary | ICD-10-CM | POA: Insufficient documentation

## 2021-12-13 DIAGNOSIS — M7989 Other specified soft tissue disorders: Secondary | ICD-10-CM | POA: Diagnosis not present

## 2021-12-13 LAB — CBC WITH DIFFERENTIAL/PLATELET
Basophils Absolute: 0 10*3/uL (ref 0.0–0.1)
Basophils Relative: 0.5 % (ref 0.0–3.0)
Eosinophils Absolute: 0.4 10*3/uL (ref 0.0–0.7)
Eosinophils Relative: 8.5 % — ABNORMAL HIGH (ref 0.0–5.0)
HCT: 38.9 % — ABNORMAL LOW (ref 39.0–52.0)
Hemoglobin: 12.9 g/dL — ABNORMAL LOW (ref 13.0–17.0)
Lymphocytes Relative: 22.8 % (ref 12.0–46.0)
Lymphs Abs: 1.1 10*3/uL (ref 0.7–4.0)
MCHC: 33.1 g/dL (ref 30.0–36.0)
MCV: 91.4 fl (ref 78.0–100.0)
Monocytes Absolute: 0.6 10*3/uL (ref 0.1–1.0)
Monocytes Relative: 11.6 % (ref 3.0–12.0)
Neutro Abs: 2.8 10*3/uL (ref 1.4–7.7)
Neutrophils Relative %: 56.6 % (ref 43.0–77.0)
Platelets: 197 10*3/uL (ref 150.0–400.0)
RBC: 4.26 Mil/uL (ref 4.22–5.81)
RDW: 13.2 % (ref 11.5–15.5)
WBC: 5 10*3/uL (ref 4.0–10.5)

## 2021-12-13 LAB — BASIC METABOLIC PANEL
BUN: 20 mg/dL (ref 6–23)
CO2: 29 mEq/L (ref 19–32)
Calcium: 9.5 mg/dL (ref 8.4–10.5)
Chloride: 104 mEq/L (ref 96–112)
Creatinine, Ser: 0.94 mg/dL (ref 0.40–1.50)
GFR: 81.59 mL/min (ref >60.00)
Glucose, Bld: 95 mg/dL (ref 70–99)
Potassium: 4.5 mEq/L (ref 3.5–5.1)
Sodium: 139 mEq/L (ref 135–145)

## 2021-12-13 LAB — D-DIMER, QUANTITATIVE: D-Dimer, Quant: 1.98 mcg/mL FEU — ABNORMAL HIGH (ref <0.50)

## 2021-12-13 MED ORDER — CELECOXIB 50 MG PO CAPS: 50.0000 mg | ORAL_CAPSULE | Freq: Two times a day (BID) | ORAL | 0 refills | Status: DC

## 2021-12-13 NOTE — Patient Instructions (Signed)
Hematoma A hematoma is a collection of blood. A hematoma can happen: Under the skin. In an organ. In a body space. In a joint space. In other tissues. The blood can thicken (clot) to form a lump that you can see and feel. The lump is often hard and may become sore and tender. The lump can be very small or very big. Most hematomas get better in a few days to weeks. However, some hematomas may be serious and need medical care. What are the causes? This condition is caused by: An injury. Blood that leaks under the skin. Problems from surgeries. Medical conditions that cause bleeding or bruising. What increases the risk? You are more likely to develop this condition if: You are an older adult. You use medicines that thin your blood. What are the signs or symptoms? Symptoms depend on where the hematoma is in your body. If the hematoma is under the skin, there is: A firm lump on the body. Pain and tenderness in the area. Bruising. The skin above the lump may be blue, dark blue, purple-red, or yellowish. If the hematoma is deep in the tissues or body spaces, there may be: Blood in the stomach. This may cause pain in the belly (abdomen), weakness, passing out (fainting), and shortness of breath. Blood in the head. This may cause a headache, weakness, trouble speaking or understanding speech, or passing out. How is this diagnosed? This condition is diagnosed based on: Your medical history. A physical exam. Imaging tests, such as ultrasound or CT scan. Blood tests. How is this treated? Treatment depends on the cause, size, and location of the hematoma. Treatment may include: Doing nothing. Many hematomas go away on their own without treatment. Surgery or close monitoring. This may be needed for large hematomas or hematomas that affect the body's organs. Medicines. These may be given if a medical condition caused the hematoma. Follow these instructions at home: Managing pain, stiffness,  and swelling  If told, put ice on the area. Put ice in a plastic bag. Place a towel between your skin and the bag. Leave the ice on for 20 minutes, 2-3 times a day for the first two days. If told, put heat on the affected area after putting ice on the area for two days. Use the heat source that your doctor tells you to use. This could be a moist heat pack or a heating pad. To do this: Place a towel between your skin and the heat source. Leave the heat on for 20-30 minutes. Remove the heat if your skin turns bright red. This is very important if you are unable to feel pain, heat, or cold. You may have a greater risk of getting burned. Raise (elevate) the affected area above the level of your heart while you are sitting or lying down. Wrap the affected area with an elastic bandage, if told by your doctor. Do not wrap the bandage too tightly. If your hematoma is on a leg or foot and is painful, your doctor may give you crutches. Use them as told by your doctor. General instructions Take over-the-counter and prescription medicines only as told by your doctor. Keep all follow-up visits as told by your doctor. This is important. Contact a doctor if: You have a fever. The swelling or bruising gets worse. You start to get more hematomas. Get help right away if: Your pain gets worse. Your pain is not getting better with medicine. Your skin over the hematoma breaks or starts to bleed.   Your hematoma is in your chest or belly and you: Pass out. Feel weak. Become short of breath. You have a hematoma on your scalp that is caused by a fall or injury, and you: Have a headache that gets worse. Have trouble speaking or understanding speech. Become less alert or you pass out. Summary A hematoma is a collection of blood in any part of your body. Most hematomas get better on their own in a few days to weeks. Some may need medical care. Follow instructions from your doctor about how to care for your  hematoma. Contact a doctor if the swelling or bruising gets worse, or if you are short of breath. This information is not intended to replace advice given to you by your health care provider. Make sure you discuss any questions you have with your health care provider. Document Revised: 04/21/2021 Document Reviewed: 04/21/2021 Elsevier Patient Education  2023 [employer] Inc[employer]  

## 2021-12-13 NOTE — Progress Notes (Signed)
Subjective:  Patient ID: Clarence Ware, male    DOB: Jan 21, 1951  Age: 71 y.o. MRN: [id number]  CC: Hypertension and Leg Injury   HPI Clarence Ware presents for f/up -  He hit his LLE just below the knee with a garden tool weeks ago and now complains of pain/swelling in the LLE extending to the foot. He is taking motrin for the pain. He is no longer taking the ARB d/t dizzy spells.  Outpatient Medications Prior to Visit  Medication Sig Dispense Refill   aspirin EC 81 MG tablet Take 81 mg by mouth daily.     atorvastatin (LIPITOR) 40 MG tablet TAKE ONE TABLET BY MOUTH ONCE DAILY 90 tablet 3   cetirizine (ZYRTEC) 10 MG tablet Take 10 mg by mouth as directed. PRN     Cholecalciferol (VITAMIN D) 2000 units CAPS Take 1 capsule by mouth daily.     ezetimibe (ZETIA) 10 MG tablet TAKE ONE TABLET BY MOUTH ONCE DAILY 90 tablet 5   melatonin 3 MG TABS tablet Take 2 tablets (6 mg total) by mouth at bedtime. (Patient taking differently: Take 3 mg by mouth at bedtime.) 180 tablet 1   PFIZER COVID-19 VAC BIVALENT injection      olmesartan (BENICAR) 40 MG tablet Take 1 tablet (40 mg total) by mouth daily. 90 tablet 1   No facility-administered medications prior to visit.    ROS Review of Systems  Constitutional: Negative.  Negative for diaphoresis and fatigue.  HENT: Negative.    Eyes: Negative.   Respiratory:  Negative for chest tightness, shortness of breath and wheezing.   Cardiovascular:  Positive for leg swelling. Negative for chest pain and palpitations.  Gastrointestinal:  Negative for abdominal pain, constipation and nausea.  Endocrine: Negative.   Genitourinary: Negative.   Musculoskeletal:  Positive for arthralgias. Negative for back pain and neck pain.  Skin: Negative.   Neurological:  Negative for dizziness, weakness, light-headedness and numbness.  Hematological:  Negative for adenopathy. Does not bruise/bleed easily.  Psychiatric/Behavioral: Negative.     Objective:   BP 114/66 (BP Location: Left Arm, Patient Position: Sitting, Cuff Size: Large)   Pulse 63   Temp 97.9 F (36.6 C) (Oral)   Ht 6' 2.5" (1.892 m)   Wt 197 lb (89.4 kg)   SpO2 98%   BMI 24.95 kg/m   BP Readings from Last 3 Encounters:  12/13/21 114/66  09/19/21 124/70  09/01/21 (!) 142/84    Wt Readings from Last 3 Encounters:  12/13/21 197 lb (89.4 kg)  09/19/21 197 lb (89.4 kg)  09/01/21 195 lb (88.5 kg)    Physical Exam Vitals reviewed.  HENT:     Nose: Nose normal.     Mouth/Throat:     Mouth: Mucous membranes are moist.  Eyes:     Conjunctiva/sclera: Conjunctivae normal.  Cardiovascular:     Rate and Rhythm: Normal rate and regular rhythm.     Heart sounds: No murmur heard. Pulmonary:     Effort: Pulmonary effort is normal.     Breath sounds: No stridor. No wheezing, rhonchi or rales.  Abdominal:     Palpations: There is no mass.     Tenderness: There is no abdominal tenderness. There is no guarding.     Hernia: No hernia is present.  Musculoskeletal:     Cervical back: Neck supple.     Right lower leg: Normal. No edema.     Left lower leg: Swelling and deformity present. No lacerations, tenderness or  bony tenderness. 1+ Pitting Edema present.       Legs:     Comments: There is a small hematoma just distal/medial to the left knee - the overlying skin is normal.  Lymphadenopathy:     Cervical: No cervical adenopathy.  Skin:    General: Skin is warm and dry.  Neurological:     General: No focal deficit present.     Mental Status: He is alert.  Psychiatric:        Mood and Affect: Mood normal.    Lab Results  Component Value Date   WBC 5.0 12/13/2021   HGB 12.9 (L) 12/13/2021   HCT 38.9 (L) 12/13/2021   PLT 197.0 12/13/2021   GLUCOSE 95 12/13/2021   CHOL 123 09/01/2021   TRIG 43.0 09/01/2021   HDL 48.10 09/01/2021   LDLCALC 66 09/01/2021   ALT 32 09/01/2021   AST 29 09/01/2021   NA 139 12/13/2021   K 4.5 12/13/2021   CL 104 12/13/2021    CREATININE 0.94 12/13/2021   BUN 20 12/13/2021   CO2 29 12/13/2021   TSH 1.48 09/01/2021   PSA 1.7 11/10/2020   HGBA1C 6.2 09/01/2021    No results found.  Assessment & Plan:   Waino was seen today for hypertension and leg injury.  Diagnoses and all orders for this visit:  Pain and swelling of left lower leg- His d-dimer is elevated. Will evaluate for DVT. -     CBC with Differential/Platelet; Future -     D-dimer, quantitative; Future -     D-dimer, quantitative -     CBC with Differential/Platelet -     VAS [place] LOWER EXTREMITY VENOUS (DVT); Future  Essential hypertension- His BP is well controlled -     Basic metabolic panel; Future -     CBC with Differential/Platelet; Future -     CBC with Differential/Platelet -     Basic metabolic panel  Hematoma of lower leg -     VAS [place] LOWER EXTREMITY VENOUS (DVT); Future  Primary osteoarthritis of both knees -     celecoxib (CELEBREX) 50 MG capsule; Take 1 capsule (50 mg total) by mouth 2 (two) times daily.   I have discontinued Clarence Ware "Clarence Ware"'s olmesartan. I am also having him start on celecoxib. Additionally, I am having him maintain his cetirizine, Vitamin D, aspirin EC, melatonin, ezetimibe, Pfizer COVID-19 Vac Bivalent, and atorvastatin.  Meds ordered this encounter  Medications   celecoxib (CELEBREX) 50 MG capsule    Sig: Take 1 capsule (50 mg total) by mouth 2 (two) times daily.    Dispense:  180 capsule    Refill:  0     Follow-up: Return in about 6 months (around 06/14/2022).  [name], MD

## 2021-12-14 ENCOUNTER — Other Ambulatory Visit (HOSPITAL_COMMUNITY): Payer: Self-pay

## 2021-12-15 ENCOUNTER — Ambulatory Visit (INDEPENDENT_AMBULATORY_CARE_PROVIDER_SITE_OTHER): Payer: PPO

## 2021-12-15 ENCOUNTER — Ambulatory Visit (INDEPENDENT_AMBULATORY_CARE_PROVIDER_SITE_OTHER): Payer: PPO | Admitting: Internal Medicine

## 2021-12-15 VITALS — BP 118/68 | Temp 98.1°F

## 2021-12-15 DIAGNOSIS — M7989 Other specified soft tissue disorders: Secondary | ICD-10-CM | POA: Diagnosis not present

## 2021-12-15 DIAGNOSIS — L02416 Cutaneous abscess of left lower limb: Secondary | ICD-10-CM

## 2021-12-15 DIAGNOSIS — M79662 Pain in left lower leg: Secondary | ICD-10-CM

## 2021-12-15 DIAGNOSIS — S8010XA Contusion of unspecified lower leg, initial encounter: Secondary | ICD-10-CM | POA: Diagnosis not present

## 2021-12-15 DIAGNOSIS — S8012XA Contusion of left lower leg, initial encounter: Secondary | ICD-10-CM

## 2021-12-15 NOTE — Progress Notes (Unsigned)
Subjective:  Patient ID: Clarence Ware, male    DOB: Aug 23, 1950  Age: 71 y.o. MRN: [id number]  CC: No chief complaint on file.   HPI Clarence Ware presents for ***  Outpatient Medications Prior to Visit  Medication Sig Dispense Refill   aspirin EC 81 MG tablet Take 81 mg by mouth daily.     atorvastatin (LIPITOR) 40 MG tablet TAKE ONE TABLET BY MOUTH ONCE DAILY 90 tablet 3   celecoxib (CELEBREX) 50 MG capsule Take 1 capsule (50 mg total) by mouth 2 (two) times daily. 180 capsule 0   cetirizine (ZYRTEC) 10 MG tablet Take 10 mg by mouth as directed. PRN     Cholecalciferol (VITAMIN D) 2000 units CAPS Take 1 capsule by mouth daily.     ezetimibe (ZETIA) 10 MG tablet TAKE ONE TABLET BY MOUTH ONCE DAILY 90 tablet 5   melatonin 3 MG TABS tablet Take 2 tablets (6 mg total) by mouth at bedtime. (Patient taking differently: Take 3 mg by mouth at bedtime.) 180 tablet 1   PFIZER COVID-19 VAC BIVALENT injection      No facility-administered medications prior to visit.    ROS Review of Systems  Objective:  There were no vitals taken for this visit.  BP Readings from Last 3 Encounters:  12/13/21 114/66  09/19/21 124/70  09/01/21 (!) 142/84    Wt Readings from Last 3 Encounters:  12/13/21 197 lb (89.4 kg)  09/19/21 197 lb (89.4 kg)  09/01/21 195 lb (88.5 kg)    Physical Exam  Lab Results  Component Value Date   WBC 5.0 12/13/2021   HGB 12.9 (L) 12/13/2021   HCT 38.9 (L) 12/13/2021   PLT 197.0 12/13/2021   GLUCOSE 95 12/13/2021   CHOL 123 09/01/2021   TRIG 43.0 09/01/2021   HDL 48.10 09/01/2021   LDLCALC 66 09/01/2021   ALT 32 09/01/2021   AST 29 09/01/2021   NA 139 12/13/2021   K 4.5 12/13/2021   CL 104 12/13/2021   CREATININE 0.94 12/13/2021   BUN 20 12/13/2021   CO2 29 12/13/2021   TSH 1.48 09/01/2021   PSA 1.7 11/10/2020   HGBA1C 6.2 09/01/2021    VAS [place] LOWER EXTREMITY VENOUS (DVT)  Result Date: 12/15/2021  Lower Venous DVT Study Patient Name:    Clarence Ware  Date of Exam:   12/15/2021 Medical Rec #: [id number]         Accession #:    [id number] Date of Birth: Jun 01, 1951        Patient Gender: M Patient Age:   42 years Exam Location:  [facility] Procedure:      VAS [place] LOWER EXTREMITY VENOUS (DVT) Referring Phys: [name] --------------------------------------------------------------------------------  Indications: Left calf pain and swelling x 5 days ago. Patient denies chest pain and SOB. Patient injured left anterolateral proximal calf 17 days ago.  Comparison Study: None Performing Technologist: Clarence Ware RVT, RDCS (AE), RDMS  Examination Guidelines: A complete evaluation includes B-mode imaging, spectral Doppler, color Doppler, and power Doppler as needed of all accessible portions of each vessel. Bilateral testing is considered an integral part of a complete examination. Limited examinations for reoccurring indications may be performed as noted. The reflux portion of the exam is performed with the patient in reverse Trendelenburg.  +-----+---------------+---------+-----------+----------+--------------+ RIGHTCompressibilityPhasicitySpontaneityPropertiesThrombus Aging +-----+---------------+---------+-----------+----------+--------------+ CFV  Full           Yes      Yes                                 +-----+---------------+---------+-----------+----------+--------------+   +---------+---------------+---------+-----------+----------+--------------+  LEFT     CompressibilityPhasicitySpontaneityPropertiesThrombus Aging +---------+---------------+---------+-----------+----------+--------------+ CFV      Full           Yes      Yes                                 +---------+---------------+---------+-----------+----------+--------------+ SFJ      Full           Yes      Yes                                 +---------+---------------+---------+-----------+----------+--------------+ FV Prox  Full                                                         +---------+---------------+---------+-----------+----------+--------------+ FV Mid   Full           Yes      Yes                                 +---------+---------------+---------+-----------+----------+--------------+ FV DistalFull           Yes      Yes                                 +---------+---------------+---------+-----------+----------+--------------+ PFV      Full                                                        +---------+---------------+---------+-----------+----------+--------------+ POP      Full           Yes      Yes                                 +---------+---------------+---------+-----------+----------+--------------+ PTV      Full           Yes      Yes                                 +---------+---------------+---------+-----------+----------+--------------+ PERO     Full           Yes      Yes                                 +---------+---------------+---------+-----------+----------+--------------+ Gastroc  Full                                                        +---------+---------------+---------+-----------+----------+--------------+ GSV      Full           Yes  Yes                                 +---------+---------------+---------+-----------+----------+--------------+    Findings reported to Dr. Ronnald Ramp' email through [facility] at 9:20 am.  Summary: RIGHT: - No evidence of common femoral vein obstruction.  LEFT: - No evidence of deep vein thrombosis in the lower extremity. No indirect evidence of obstruction proximal to the inguinal ligament. - No cystic structure found in the popliteal fossa.  - Left proximal anterolateral complex cystic collection measuring approximately 4.3 x 1.8 x 3.0 cm with edematous surrounding tissue, most likely representing a hematoma. - Ultrasound characteristics of enlarged lymph nodes noted in the groin,largest measuring 2.2 x 1.8 x .9 cm.  *See  table(s) above for measurements and observations. Electronically signed by Clarence Rouge MD on 12/15/2021 at 10:20:23 AM.    Final      Assessment & Plan:   Diagnoses and all orders for this visit:  Hematoma of lower leg   I am having Clarence Ware "Clarence Ware" maintain his cetirizine, Vitamin D, aspirin EC, melatonin, ezetimibe, Pfizer COVID-19 Vac Bivalent, atorvastatin, and celecoxib.  No orders of the defined types were placed in this encounter.    Follow-up: No follow-ups on file.  [name], MD

## 2021-12-18 ENCOUNTER — Encounter: Payer: Self-pay | Admitting: Internal Medicine

## 2021-12-18 NOTE — Patient Instructions (Signed)
Incision and Drainage Incision and drainage is a surgical procedure to open and drain a fluid-filled sac. The sac may be filled with pus, mucus, or blood. Examples of fluid-filled sacs that may need surgical drainage include cysts, skin infections (abscesses), and red lumps that develop from a ruptured cyst or a small abscess (boils). You may need this procedure if the affected area is large, painful, infected, or not healing well. Tell a health care provider about: Any allergies you have. All medicines you are taking, including vitamins, herbs, eye drops, creams, and over-the-counter medicines. Any problems you or family members have had with anesthetic medicines. Any blood disorders you have or have had. Any surgeries you have had. Any medical conditions you have or have had. Whether you are pregnant or may be pregnant. What are the risks? Generally, this is a safe procedure. However, problems may occur, including: Infection. Bleeding. Allergic reactions to medicines. Scarring. The cyst or abscess returns. Damage to nerves or vessels. What happens before the procedure? Medicine Ask your health care provider about: Changing or stopping your regular medicines. This is especially important if you are taking diabetes medicines or blood thinners. Taking medicines such as aspirin and ibuprofen. These medicines can thin your blood. Do not take these medicines unless your health care provider tells you to take them. Taking over-the-counter medicines, vitamins, herbs, and supplements. Tests You may have an exam or testing. These may include: Ultrasound or other imaging tests to see how large or deep the fluid-filled sac is. Blood tests to check for infection. General instructions Follow instructions from your health care provider about eating or drinking restrictions. Plan to have someone take you home from the hospital or clinic. Ask your health care provider whether a responsible adult  should care for you for at least 24 hours after you leave the hospital or clinic. This is important. You may get a tetanus shot. Ask your health care provider: How your surgery site will be marked or identified. What steps will be taken to help prevent infection. These may include: Removing hair at the surgery site. Washing skin with a germ-killing soap. Receiving antibiotic medicine. What happens during the procedure?  An IV may be inserted into one of your veins. You will be given one or more of the following: A medicine to help you relax (sedative). A medicine to numb the area (local anesthetic). A medicine to make you fall asleep (general anesthetic). An incision will be made in the top of the fluid-filled sac. Pus, blood, and mucus will be squeezed out, and a syringe or tube (drain) may be used to empty more fluid from the sac. Your health care provider will do one of the following. He or she may: Leave the drain in place for several weeks to drain more fluid. Stitch open the edges of the incision to make a long-term opening for drainage (marsupialization). The inside of the sac may be washed out (irrigated) with a sterile solution and packed with gauze before it is covered with a bandage (dressing). Your health care provider may do a culture test of the drainage fluid. The procedure may vary among health care providers and hospitals. What happens after the procedure? Your blood pressure, heart rate, breathing rate, and blood oxygen level will be monitored often until you leave the hospital or clinic. Do not drive for 24 hours if you were given a sedative during your procedure. Summary Incision and drainage is a surgical procedure to open and drain a fluid-filled   sac. The sac may be filled with pus, mucus, or blood. Before the procedure, you may be given antibiotic medicine to treat or help prevent infection. During the procedure, an incision will be made in the top of the  fluid-filled sac. Pus, blood, and mucus is squeezed out, and a syringe or tube (drain) may be used to empty more fluid from the sac. The inside of the sac may be washed out (irrigated) with a sterile solution and packed with gauze before it is covered with a bandage (dressing). This information is not intended to replace advice given to you by your health care provider. Make sure you discuss any questions you have with your health care provider. Document Revised: 04/07/2021 Document Reviewed: 04/07/2021 Elsevier Patient Education  2023 [employer]  

## 2021-12-19 ENCOUNTER — Encounter: Payer: Self-pay | Admitting: Internal Medicine

## 2021-12-19 LAB — WOUND CULTURE: RESULT:: NO GROWTH

## 2021-12-22 ENCOUNTER — Ambulatory Visit: Payer: PPO | Admitting: Internal Medicine

## 2022-01-03 ENCOUNTER — Ambulatory Visit (INDEPENDENT_AMBULATORY_CARE_PROVIDER_SITE_OTHER): Payer: PPO

## 2022-01-03 ENCOUNTER — Encounter: Payer: Self-pay | Admitting: Family Medicine

## 2022-01-03 ENCOUNTER — Ambulatory Visit (INDEPENDENT_AMBULATORY_CARE_PROVIDER_SITE_OTHER): Payer: PPO | Admitting: Family Medicine

## 2022-01-03 VITALS — BP 132/70 | HR 56 | Temp 97.8°F | Ht 74.5 in | Wt 196.0 lb

## 2022-01-03 DIAGNOSIS — D1801 Hemangioma of skin and subcutaneous tissue: Secondary | ICD-10-CM | POA: Diagnosis not present

## 2022-01-03 DIAGNOSIS — B078 Other viral warts: Secondary | ICD-10-CM | POA: Diagnosis not present

## 2022-01-03 DIAGNOSIS — L814 Other melanin hyperpigmentation: Secondary | ICD-10-CM | POA: Diagnosis not present

## 2022-01-03 DIAGNOSIS — S6991XA Unspecified injury of right wrist, hand and finger(s), initial encounter: Secondary | ICD-10-CM | POA: Diagnosis not present

## 2022-01-03 DIAGNOSIS — L821 Other seborrheic keratosis: Secondary | ICD-10-CM | POA: Diagnosis not present

## 2022-01-03 DIAGNOSIS — L820 Inflamed seborrheic keratosis: Secondary | ICD-10-CM | POA: Diagnosis not present

## 2022-01-03 DIAGNOSIS — S60021A Contusion of right index finger without damage to nail, initial encounter: Secondary | ICD-10-CM | POA: Diagnosis not present

## 2022-01-04 ENCOUNTER — Encounter: Payer: Self-pay | Admitting: Family Medicine

## 2022-01-05 NOTE — Telephone Encounter (Signed)
FYI.. pt states he doesn't think he needs a hand specialist

## 2022-01-16 ENCOUNTER — Telehealth: Payer: PPO

## 2022-01-23 ENCOUNTER — Ambulatory Visit: Payer: PPO | Admitting: Internal Medicine

## 2022-01-25 ENCOUNTER — Other Ambulatory Visit: Payer: Self-pay | Admitting: Internal Medicine

## 2022-01-25 DIAGNOSIS — M170 Bilateral primary osteoarthritis of knee: Secondary | ICD-10-CM

## 2022-03-20 ENCOUNTER — Ambulatory Visit: Payer: PPO | Admitting: Neurology

## 2022-03-20 ENCOUNTER — Encounter: Payer: Self-pay | Admitting: Neurology

## 2022-03-20 VITALS — BP 159/99 | HR 56 | Ht 74.0 in | Wt 197.0 lb

## 2022-03-20 DIAGNOSIS — G4752 REM sleep behavior disorder: Secondary | ICD-10-CM | POA: Diagnosis not present

## 2022-03-20 NOTE — Progress Notes (Signed)
SLEEP MEDICINE CLINIC   Provider:  Larey Seat, M D  Referring Provider: [name], MD Primary Care Physician:  [name], MD  Chief Complaint  Patient presents with   Follow-up    Pt alone, rm 10. Presents today for yearly follow up. Overall stable and no concerns.    HPI:  Clarence Ware is a 71 y.o. male , 03-20-2022, he never took klonopin, here to see if he has any associated symptoms of cognitive decline , of Parkinsonism. Has been taking melatonin.  Has some morning a headache, congested nasal passage. Reports occasional name recall delay, but it " floats up". He is in a [employer] study. He had a first visit 04-2021 at [place], [place] scan was done there. 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 Movement disorder MD assessed walking speed.  Tomorrow he has a phone call interview and follow up.  Clarence Ware scored today on the [facility] cognitive assessment 28 out of 30.  2 points were lost in the 5 word recall but these words were recalled with a single.  His Epworth score was endorsed at 4 points out of 24 points his fatigue severity score 21 out of 63 points.              last seen on 03-16-2021, I have the pleasure of seeing Clarence Ware and this is the first time in a while.  He states that he had no need to use Klonopin over the last 12 months has not fallen out of bed and since he sleeps in a separate bedroom from his wife he is not sure that he has any enactment at all.  He feels that he is well controlled on melatonin only at this time.  As we know he had been SNRI for a while but we never found a significant amount of apnea warranting any intervention.  He has been doing okay with a mouthguard which controlled his snoring but unfortunately has led to a shift in his teeth.  His fatigue severity scale today was endorsed at only 14 points  and his Epworth sleepiness score at 3 points.  The patient has been diagnosed with REM behavior disorder and he is now enrolled in the [employer] study.  He enrolled online, he performed a sniff test in which he must have not done well, and is now scheduled- in [place] -to undergo a DAT scan.        Last seen in a Rv after sleep study on 03-16-2020.  He underwent a study on 25 January 2020 which showed no evidence of obstructive sleep apnea and there was no clinically relevant periodic limb movement especially not in REM sleep.  Normal EKG no drop in oxygen and treatment suggestions were made to stop with melatonin or an SSRI and to hold off on Klonopin as it is a potentially habit-forming medication.  However when he travels or may have for some reason unusual circumstances more stress sleep deprivation etc. it may well be worth having Klonopin as a rescue medication for that night.  The couple now sleeps in different rooms because of the REM enactment. He is still shouting.   He takes 3 mg melatonin, which may have reduced the physical activity.  He can use klonopin for rescue in certain situations, in light of his substance abuse history.  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 He was seen here as a referral from Dr. Ronnald Ramp for for a sleep consultation for the first time in 10-2015. Now he is here for a different concern , he lost weight, he was considered pre-diabetic, and his snoring is less. He is using a mouth piece made to measure by Dr. Ron Parker. No longer wakes up with headaches when wearing his mouth piece.     Chief complaint according to patient :" Recently the enactment of dreams has become a more prominent feature of his sleep, and of this year he had fallen out of bed dove out of bed as reported also broke nightstand lamp in the process.  REM sleep behavior was not a feature rehab explored when we first met over 4 years ago so it is a fairly recent  event.  Clarence Ware has respiratory allergies and of hyperlipidemia and hypertension, pre diabetes- all improved with weight loss.  he has no history of morbid obesity, and no history of shift work.  Sleep habits are as follows: Patient usually goes to bed around 9-9.30 PM and rises at 7 AM.  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  He usually falls asleep promptly after going to bed, he likes to read in bed, he sleeps on the side when he falls asleep but during the night resume supine position -he snores much less after weight loss and with mouth guard. . He sleeps on a flat bed, on 2 pillows. He does not watch TV in the bedroom, neither does his wife. He may have one bathroom break at night, rarely 2.  The dream enactment is around midnight or later-  This means he will get about 9 hours of sleep at night. He uses a phone- alarm to wake up in the morning at about 6 -7 AM, never uses the snooze button.  Many mornings he is just awake before the alarm rings. No longer has a  dry, parched mouth or sore throat.   Sleep medical history and family sleep history: The patient is unaware of any close family members that may have a sleep disorder or may be treated for sleep disorder. Mother had Picks frontal lobe dementia.   Social history: Clarence Ware is married, lives with his wife, he no longer smokes and no longer takes pain medication.  2 grown children . No grandchildren.  Wife works part time.   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 He may drink another diet soda with caffeine during the day.  Again he no longer smokes or uses alcohol.   Review of Systems: Out of a complete 14 system review, the patient complains of only the following symptoms, and all other reviewed systems are negative. REM BD- yelling, screaming and has fallen  out of bed, has bruised his right shoulder . Rarely has he woken himself.   How likely are you to doze in the following situations: 0 = not likely, 1 = slight chance, 2 = moderate chance, 3 = high chance  Sitting and Reading? Watching Television? Sitting inactive in a public place (theater or meeting)? Lying down in the afternoon when circumstances permit? Sitting and talking to someone? Sitting quietly after lunch without alcohol? In a car, while stopped for a few minutes in traffic? As a passenger in a  car for an hour without a break?  Total = 4 , FSS at 21/ 63.    Epworth score last  time was 5 , Fatigue severity score 31 , depression score 2.   Social History   Socioeconomic History   Marital status: Married    Spouse name: Earlie Server   Number of children: 2   Years of education: Not on file   Highest education level: Not on file  Occupational History   Occupation: Pharmacist part-time  Tobacco Use   Smoking status: Former    Packs/day: 0.25    Years: 1.00    Total pack years: 0.25    Types: Cigarettes    Quit date: 07/10/1992    Years since quitting: 29.7   Smokeless tobacco: Never  Vaping Use   Vaping Use: Never used  Substance and Sexual Activity   Alcohol use: No    Alcohol/week: 0.0 standard drinks of alcohol   Drug use: Not Currently    Comment: sobrity for 10 years   Sexual activity: Yes  Other Topics Concern   Not on file  Social History Narrative   Married. Education: [employer].   Exercise: some   Caffeine- coffee 2 c daily   Social Determinants of Health   Financial Resource Strain: Low Risk  (06/06/2021)   Overall Financial Resource Strain (CARDIA)    Difficulty of Paying Living Expenses: Not hard at all  Food Insecurity: No Food Insecurity (06/06/2021)   Hunger Vital Sign    Worried About Running Out of Food in the Last Year: Never true    Ran Out of Food in the Last Year: Never true  Transportation Needs: No Transportation Needs (06/06/2021)    PRAPARE - Hydrologist (Medical): No    Lack of Transportation (Non-Medical): No  Physical Activity: Sufficiently Active (06/06/2021)   Exercise Vital Sign    Days of Exercise per Week: 7 days    Minutes of Exercise per Session: 30 min  Stress: No Stress Concern Present (06/06/2021)   [place]    Feeling of Stress : Not at all  Social Connections: Moderately Integrated (06/06/2021)   Social Connection and Isolation Panel [NHANES]    Frequency of Communication with Friends and Family: Twice a week    Frequency of Social Gatherings with Friends and Family: Twice a week    Attends Religious Services: Never    Marine scientist or Organizations: Yes    Attends Music therapist: More than 4 times per year    Marital Status: Married  Human resources officer Violence: Not At Risk (06/06/2021)   Humiliation, Afraid, Rape, and Kick questionnaire    Fear of Current or Ex-Partner: No    Emotionally Abused: No    Physically Abused: No    Sexually Abused: No    Family History  Problem Relation Age of Onset   Heart disease Mother    Stroke Mother    Heart disease Father 32       MI   Hypertension Father    Hypertension Brother    Cancer Brother    Diabetes Brother    Heart disease Brother    Hyperlipidemia Brother    Cancer Brother    Hyperlipidemia Sister    Coronary artery disease Brother 67       CABG   Colon cancer Paternal Grandmother     Past Medical History:  Diagnosis Date   Allergy  First degree heart block    Hyperlipidemia    Hypertension    REM sleep behavior disorder 2019   Substance abuse [facility])     Past Surgical History:  Procedure Laterality Date   INGUINAL HERNIA REPAIR Right 03/10/2015   Procedure: OPEN RIGHT INGUINAL HERNIA REPAIR;  Surgeon: Johnathan Hausen, MD;  Location: [facility];  Service: General;  Laterality: Right;  With MESH   VASECTOMY       Current Outpatient Medications  Medication Sig Dispense Refill   alfuzosin (UROXATRAL) 10 MG 24 hr tablet Take 10 mg by mouth every morning.     aspirin EC 81 MG tablet Take 81 mg by mouth daily.     atorvastatin (LIPITOR) 40 MG tablet TAKE ONE TABLET BY MOUTH ONCE DAILY 90 tablet 3   celecoxib (CELEBREX) 50 MG capsule Take 50 mg by mouth daily as needed for pain.     cetirizine (ZYRTEC) 10 MG tablet Take 10 mg by mouth as directed. PRN     Cholecalciferol (VITAMIN D) 2000 units CAPS Take 1 capsule by mouth daily.     ezetimibe (ZETIA) 10 MG tablet TAKE ONE TABLET BY MOUTH ONCE DAILY 90 tablet 5   melatonin 3 MG TABS tablet Take 2 tablets (6 mg total) by mouth at bedtime. (Patient taking differently: Take 3 mg by mouth at bedtime.) 180 tablet 1   PFIZER COVID-19 VAC BIVALENT injection      No current facility-administered medications for this visit.    Allergies as of 03/20/2022 - Review Complete 03/20/2022  Allergen Reaction Noted   Nexium [esomeprazole magnesium]  03/14/2013    Vitals: BP (!) 159/99   Pulse (!) 56   Ht $R'6\' 2"'VD$  (1.88 m)   Wt 197 lb (89.4 kg)   BMI 25.29 kg/m  Last Weight:  Wt Readings from Last 1 Encounters:  03/20/22 197 lb (89.4 kg)   HBZ:JIRC mass index is 25.29 kg/m.       Last Height:   Ht Readings from Last 1 Encounters:  03/20/22 $RemoveB'6\' 2"'HqZIEurv$  (1.88 m)    Physical exam:  General: The patient is awake, alert and appears not in acute distress.  The patient is well groomed. He is much slimmer than in 2017.    Head: Normocephalic, atraumatic. Neck is supple. Mallampati 3- tip of the uvula behind the tongue ground.  There is no longer evidence of a scalloped tongue/  neck circumference now 16. 25"  . Nasal airflow restricted ,  he had deviated septum surgery. TMJ not  evident . Retrognathia is not seen.  Full facial hair.  Cardiovascular:  Regular rate and rhythm, without  murmurs or carotid bruit, and without distended neck veins. Respiratory: Lungs  are clear to auscultation. Skin:  Without evidence of edema, or rash Trunk: BMI is 25.25 kg/ m2 from 28.  The patient's posture is erect  Neurologic exam : The patient is awake and alert, oriented to place and time.   Memory subjective  described as intact.[facility]    03/20/2022    8:26 AM  Montreal Cognitive Assessment   Visuospatial/ Executive (0/5) 5  Naming (0/3) 3  Attention: Read list of digits (0/2) 2  Attention: Read list of letters (0/1) 1  Attention: Serial 7 subtraction starting at 100 (0/3) 3  Language: Repeat phrase (0/2) 2  Language : Fluency (0/1) 1  Abstraction (0/2) 2  Delayed Recall (0/5) 3  Orientation (0/6) 6  Total 28     Attention span & concentration ability  appears normal.  Speech is fluent,  without dysarthria, dysphonia or aphasia.  Mood and affect are appropriate.  Cranial nerves:  in an abbreviated smell test, patient couldn't identify  Citrus or vanilla smell.  Pupils are equal and briskly reactive to light. Funduscopic exam deferred.  Extraocular movements  in vertical and horizontal planes intact and without nystagmus. Visual fields by finger perimetry are intact. Hearing to finger rub intact- wearing hearing aids in both ears. Facial sensation intact to fine touch. Facial motor strength is symmetric and tongue and uvula move midline. Shoulder shrug was symmetrical.   Motor exam:  Normal tone, muscle bulk and symmetric strength in all extremities. Loose wrist, fully rleaxed biceps , no cogwheeling, still - good handwriting. . Slightly more tone on his right shoulder , related to ROM Sensory:  Fine touch and vibration were tested in all extremities.  Proprioception tested in the upper extremities was normal. Coordination:  Reports no changes in penmanship. Rapid alternating movements in the fingers/hands intact. Finger-to-nose maneuver normal without evidence of ataxia, dysmetria or tremor. Very fluent.   Gait and station: Patient walks without  assistive device and is able unassisted to climb up to the exam table. Strength within normal limits. He can stand up an go, not bracing.   Stance is stable and normal.   Deep tendon reflexes: in the upper and lower extremities are symmetric and intact. Babinski maneuver deferred.   The patient was advised of the nature of the diagnosed sleep disorder , the treatment options and risks for general a health and wellness arising from not treating the condition.  I spent more than 35 minutes of face to face time with the patient.   Greater than 50% of time was spent in counseling and coordination of care. We have discussed the diagnosis and differential and I answered the patient's questions.    Assessment:  After physical and neurologic examination, review of laboratory studies,  Personal review of imaging studies, reports of other /same  Imaging studies ,  Results of polysomnography/ neurophysiology testing and pre-existing records as far as provided in visit., my assessment is ;  After the infusion of contrast material, a normal enhancement pattern is noted.     IMPRESSION:   2020 study  1.  Couple punctate T2/FLAIR hyperintense foci in the subcortical or deep white matter of the hemispheres consistent with very minimal chronic microvascular ischemic change, typical for age. 2.  Mild ethmoid, sphenoid and right frontal chronic sinusitis. 3.  There were no acute findings and there was a normal enhancement pattern.         INTERPRETING PHYSICIAN:  Bocephus A. Epimenio Foot, MD, PhD, FAAN Certified in  Neuroimaging by [employer]  1)  UARS - uses a dental device ( made to measure with Dr Myrtis Ser.) The patient has gained control of his UARS, snoring and very mild apnea, AHI was 2.5/h in 5/ 2017 . AHI was now 0.5/h and RDI was not scored. He is status quo- no need for intervention.   2) new onset REM BD 2019- first symptoms in retrospect in 2018. - clear evidence of onset after  midnight- not a slow wave sleep parasomnia. Usually stays in bed , thrashes and yells, but recently has fallen out of bed. Melatonin was initiated most recently 3 weeks ago, but he still yells and screams, has not fallen out of bed since.   Continues with  3 mg qhs melatonin, -may use klonopin for extreme situations.  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  He is now enrolled in a PD study with the [place], enrolled at step 2, smell test, now pending DAT scan for imaging of dopaminergic structural  pathways. He enrolled online. He got kicked out of the Alzheimer's study.    Plan:  Treatment plan and additional workup :  BP variable , had been taken off BP medications by his cardiologist, Dr Ellouise Newer. Due to Orthostatic BP - were taken in his office. He had lightheadedness, beta blocker and losartin were d/c. Now , 2.5 weeks ago back on 40 mg Olmesartin, and now is off that too.   Starting Klonopin only if Melatonin fails to control - patient and wife sleep in separate rooms. He has still not needed it.  He has a substance abuse history and would not like to start this medication, for obvious reasons.  Would be preferable to use an SSRI for REM suppression .   Snoring is controlled with mouthguard.   RV every 12 months, recommended boxing, here in [place].      Asencion Partridge Tyran Huser MD  03/20/2022   CC: [name], Md [address] [place],  [place] [address]

## 2022-04-11 ENCOUNTER — Other Ambulatory Visit: Payer: Self-pay | Admitting: Cardiovascular Disease

## 2022-04-11 ENCOUNTER — Other Ambulatory Visit: Payer: Self-pay | Admitting: Internal Medicine

## 2022-04-11 DIAGNOSIS — N401 Enlarged prostate with lower urinary tract symptoms: Secondary | ICD-10-CM

## 2022-05-30 ENCOUNTER — Ambulatory Visit (INDEPENDENT_AMBULATORY_CARE_PROVIDER_SITE_OTHER): Payer: PPO

## 2022-05-30 ENCOUNTER — Ambulatory Visit (INDEPENDENT_AMBULATORY_CARE_PROVIDER_SITE_OTHER): Payer: PPO | Admitting: Internal Medicine

## 2022-05-30 VITALS — BP 132/78 | HR 64 | Temp 98.4°F | Resp 16 | Ht 74.0 in | Wt 199.0 lb

## 2022-05-30 DIAGNOSIS — S92514A Nondisplaced fracture of proximal phalanx of right lesser toe(s), initial encounter for closed fracture: Secondary | ICD-10-CM

## 2022-05-30 DIAGNOSIS — S99921A Unspecified injury of right foot, initial encounter: Secondary | ICD-10-CM

## 2022-05-30 DIAGNOSIS — I10 Essential (primary) hypertension: Secondary | ICD-10-CM | POA: Diagnosis not present

## 2022-05-30 NOTE — Progress Notes (Signed)
Subjective:  Patient ID: [name], male    DOB: Jan 17, 1951  Age: 71 y.o. MRN: [id number]  CC: Hypertension   HPI Zayne Draheim presents for f/up -  He injured his right second toe about a month ago and complains of persistent pain and swelling.  He wants to know if it is broken.  Outpatient Medications Prior to Visit  Medication Sig Dispense Refill   alfuzosin (UROXATRAL) 10 MG 24 hr tablet TAKE ONE TABLET BY MOUTH EVERY MORNING 90 tablet 1   aspirin EC 81 MG tablet Take 81 mg by mouth daily.     atorvastatin (LIPITOR) 40 MG tablet TAKE ONE TABLET BY MOUTH ONCE DAILY 90 tablet 3   celecoxib (CELEBREX) 50 MG capsule Take 50 mg by mouth daily as needed for pain.     cetirizine (ZYRTEC) 10 MG tablet Take 10 mg by mouth as directed. PRN     Cholecalciferol (VITAMIN D) 2000 units CAPS Take 1 capsule by mouth daily.     ezetimibe (ZETIA) 10 MG tablet TAKE ONE TABLET BY MOUTH ONCE DAILY 90 tablet 3   melatonin 3 MG TABS tablet Take 2 tablets (6 mg total) by mouth at bedtime. (Patient taking differently: Take 3 mg by mouth at bedtime.) 180 tablet 1   PFIZER COVID-19 VAC BIVALENT injection      No facility-administered medications prior to visit.    ROS Review of Systems  Constitutional: Negative.  Negative for diaphoresis and fatigue.  HENT: Negative.    Eyes: Negative.   Respiratory:  Negative for chest tightness, shortness of breath and wheezing.   Cardiovascular:  Negative for chest pain, palpitations and leg swelling.  Gastrointestinal:  Negative for abdominal pain, diarrhea and nausea.  Endocrine: Negative.   Genitourinary: Negative.  Negative for difficulty urinating.  Musculoskeletal:  Positive for arthralgias.  Skin: Negative.   Neurological: Negative.  Negative for dizziness and weakness.  Hematological:  Negative for adenopathy. Does not bruise/bleed easily.  Psychiatric/Behavioral: Negative.      Objective:  BP 132/78 (BP Location: Right Arm, Patient  Position: Sitting, Cuff Size: Normal)   Pulse 64   Temp 98.4 F (36.9 C) (Oral)   Resp 16   Ht '6\' 2"'$  (1.88 m)   Wt 199 lb (90.3 kg)   SpO2 98%   BMI 25.55 kg/m   BP Readings from Last 3 Encounters:  05/30/22 132/78  03/20/22 (!) 159/99  01/03/22 132/70    Wt Readings from Last 3 Encounters:  05/30/22 199 lb (90.3 kg)  03/20/22 197 lb (89.4 kg)  01/03/22 196 lb (88.9 kg)    Physical Exam Vitals reviewed.  HENT:     Mouth/Throat:     Mouth: Mucous membranes are moist.  Eyes:     General: No scleral icterus.    Conjunctiva/sclera: Conjunctivae normal.  Cardiovascular:     Rate and Rhythm: Normal rate and regular rhythm.     Heart sounds: No murmur heard. Pulmonary:     Effort: Pulmonary effort is normal.     Breath sounds: No stridor. No wheezing, rhonchi or rales.  Abdominal:     General: Abdomen is flat.     Palpations: There is no mass.     Tenderness: There is no abdominal tenderness. There is no guarding.     Hernia: No hernia is present.  Musculoskeletal:        General: Tenderness present.     Cervical back: Neck supple.     Comments: Right second toe is  mildly, diffusely swollen and tender.  The skin and nail are intact.  Lymphadenopathy:     Cervical: No cervical adenopathy.  Skin:    General: Skin is warm and dry.  Neurological:     General: No focal deficit present.     Mental Status: He is alert.  Psychiatric:        Mood and Affect: Mood normal.     Lab Results  Component Value Date   WBC 5.0 12/13/2021   HGB 12.9 (L) 12/13/2021   HCT 38.9 (L) 12/13/2021   PLT 197.0 12/13/2021   GLUCOSE 95 12/13/2021   CHOL 123 09/01/2021   TRIG 43.0 09/01/2021   HDL 48.10 09/01/2021   LDLCALC 66 09/01/2021   ALT 32 09/01/2021   AST 29 09/01/2021   NA 139 12/13/2021   K 4.5 12/13/2021   CL 104 12/13/2021   CREATININE 0.94 12/13/2021   BUN 20 12/13/2021   CO2 29 12/13/2021   TSH 1.48 09/01/2021   PSA 1.7 11/10/2020   HGBA1C 6.2 09/01/2021     DG Foot Complete Right  Result Date: 05/30/2022 CLINICAL DATA:  Injured 1 month ago.  Hit foot on a chair. EXAM: RIGHT FOOT COMPLETE - 3+ VIEW COMPARISON:  None Available. FINDINGS: Mild-to-moderate interphalangeal joint space narrowing diffusely. Minimal great toe metatarsophalangeal peripheral degenerative spurring and minimal joint space narrowing. Mild chronic enthesopathic change at the Achilles insertion on the calcaneus. Tiny plantar calcaneal heel spur. There is mild relative lucency and possible minimal cortical step-off at the distal metaphysis of the proximal phalanx of the second toe, raising the question of a subacute healing fracture. IMPRESSION: 1. Possible subacute healing fracture of the distal metaphysis of the proximal phalanx of the second toe. This may fit the patient history. Recommend clinical correlation for point tenderness. 2. 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 Electronically Signed   By: Yvonne Kendall M.D.   On: 05/30/2022 11:56     Assessment & Plan:   Latif was seen today for hypertension.  Diagnoses and all orders for this visit:  Toe injury, right, initial encounter -     DG Foot Complete Right; Future  Foot injury, right, initial encounter -     DG Foot Complete Right; Future  Essential hypertension- His blood pressure is well-controlled.  Closed nondisplaced fracture of proximal phalanx of lesser toe of right foot, initial encounter- This is healing with no complications.   I am having Dayson Caprice Beaver "[name]" maintain his cetirizine, Vitamin D, aspirin EC, melatonin, Pfizer COVID-19 Vac Bivalent, atorvastatin, celecoxib, ezetimibe, and alfuzosin.  No orders of the defined types were placed in this encounter.    Follow-up: No follow-ups on file.  [name], MD

## 2022-06-05 ENCOUNTER — Encounter: Payer: Self-pay | Admitting: Internal Medicine

## 2022-06-05 DIAGNOSIS — S92514A Nondisplaced fracture of proximal phalanx of right lesser toe(s), initial encounter for closed fracture: Secondary | ICD-10-CM | POA: Insufficient documentation

## 2022-06-07 ENCOUNTER — Ambulatory Visit (HOSPITAL_BASED_OUTPATIENT_CLINIC_OR_DEPARTMENT_OTHER): Payer: PPO | Admitting: Orthopaedic Surgery

## 2022-06-09 ENCOUNTER — Other Ambulatory Visit: Payer: Self-pay | Admitting: Internal Medicine

## 2022-06-09 ENCOUNTER — Ambulatory Visit (INDEPENDENT_AMBULATORY_CARE_PROVIDER_SITE_OTHER): Payer: PPO | Admitting: *Deleted

## 2022-06-09 ENCOUNTER — Encounter: Payer: Self-pay | Admitting: *Deleted

## 2022-06-09 VITALS — Ht 74.0 in | Wt 192.0 lb

## 2022-06-09 DIAGNOSIS — Z0000 Encounter for general adult medical examination without abnormal findings: Secondary | ICD-10-CM

## 2022-06-09 DIAGNOSIS — M170 Bilateral primary osteoarthritis of knee: Secondary | ICD-10-CM

## 2022-06-09 MED ORDER — CELECOXIB 100 MG PO CAPS: 100.0000 mg | ORAL_CAPSULE | Freq: Every day | ORAL | 0 refills | Status: DC

## 2022-06-09 MED ORDER — CELECOXIB 50 MG PO CAPS: 50.0000 mg | ORAL_CAPSULE | Freq: Every day | ORAL | 1 refills | Status: DC | PRN

## 2022-06-09 NOTE — Progress Notes (Signed)
Subjective:   Clarence Ware is a 71 y.o. male who presents for Medicare Annual/Subsequent preventive examination. I connected with  [name] on 06/09/22 by a audio enabled telemedicine application and verified that I am speaking with the correct person using two identifiers.  Patient Location: Home  Provider Location: Office/Clinic  I discussed the limitations of evaluation and management by telemedicine. The patient expressed understanding and agreed to proceed.   Review of Systems    Defer to PCP Cardiac Risk Factors include: advanced age (>28mn, >>17women);male gender    Please see problem list for additional risk factors Objective:    Today's Vitals   06/09/22 0913  Weight: 192 lb (87.1 kg)  Height: '6\' 2"'$  (1.88 m)   Body mass index is 24.65 kg/m.     06/09/2022    9:15 AM 09/21/2021    7:57 AM 06/06/2021    1:57 PM 03/19/2020    8:27 AM 02/23/2020    4:54 PM 03/07/2019    9:03 AM 12/27/2018    8:01 AM  Advanced Directives  Does Patient Have a Medical Advance Directive? Yes Yes Yes Yes No Yes Yes  Type of [place] will   [place] Does patient want to make changes to medical advance directive?  No - Patient declined  No - Patient declined     Copy of HPaiain Chart?  No - copy requested No - copy requested   No - copy requested     Current Medications (verified) Outpatient Encounter Medications as of 06/09/2022  Medication Sig   alfuzosin (UROXATRAL) 10 MG 24 hr tablet TAKE ONE TABLET BY MOUTH EVERY MORNING   aspirin EC 81 MG tablet Take 81 mg by mouth daily.   atorvastatin (LIPITOR) 40 MG tablet TAKE ONE TABLET BY MOUTH ONCE DAILY   cetirizine (ZYRTEC) 10 MG tablet Take 10 mg by mouth as directed. PRN   Cholecalciferol (VITAMIN D) 2000 units CAPS  Take 1 capsule by mouth daily.   ezetimibe (ZETIA) 10 MG tablet TAKE ONE TABLET BY MOUTH ONCE DAILY   melatonin 3 MG TABS tablet Take 2 tablets (6 mg total) by mouth at bedtime. (Patient taking differently: Take 3 mg by mouth at bedtime.)   [DISCONTINUED] celecoxib (CELEBREX) 50 MG capsule Take 50 mg by mouth daily as needed for pain.   [DISCONTINUED] PFIZER COVID-19 VAC BIVALENT injection    No facility-administered encounter medications on file as of 06/09/2022.    Allergies (verified) Nexium [esomeprazole magnesium]   History: Past Medical History:  Diagnosis Date   Allergy    First degree heart block    Hyperlipidemia    Hypertension    REM sleep behavior disorder    Substance abuse ([facility]    Past Surgical History:  Procedure Laterality Date   INGUINAL HERNIA REPAIR Right 03/10/2015   Procedure: OPEN RIGHT INGUINAL HERNIA REPAIR;  Surgeon: MJohnathan Hausen MD;  Location: [facility];  Service: General;  Laterality: Right;  With MESH   VASECTOMY     Family History  Problem Relation Age of Onset   Heart disease Mother    Stroke Mother    Heart disease Father 561      MI   Hypertension Father    Hypertension Brother    Cancer Brother    Diabetes Brother    Heart disease Brother  Hyperlipidemia Brother    Cancer Brother    Hyperlipidemia Sister    Coronary artery disease Brother 31       CABG   Colon cancer Paternal Grandmother    Social History   Socioeconomic History   Marital status: Married    Spouse name: Dorothy   Number of children: 2   Years of education: Not on file   Highest education level: Not on file  Occupational History   Occupation: Pharmacist part-time  Tobacco Use   Smoking status: Former    Packs/day: 0.25    Years: 1.00    Total pack years: 0.25    Types: Cigarettes    Quit date: 07/10/1992    Years since quitting: 29.9   Smokeless tobacco: Never  Vaping Use   Vaping Use: Never used  Substance and Sexual Activity   Alcohol use: No     Alcohol/week: 0.0 standard drinks of alcohol   Drug use: Not Currently    Comment: sobrity for 10 years   Sexual activity: Yes  Other Topics Concern   Not on file  Social History Narrative   Married. Education: [employer].   Exercise: some   Caffeine- coffee 2 c daily   Social Determinants of Health   Financial Resource Strain: Low Risk  (06/09/2022)   Overall Financial Resource Strain (CARDIA)    Difficulty of Paying Living Expenses: Not hard at all  Food Insecurity: No Food Insecurity (06/09/2022)   Hunger Vital Sign    Worried About Running Out of Food in the Last Year: Never true    Ran Out of Food in the Last Year: Never true  Transportation Needs: No Transportation Needs (06/09/2022)   PRAPARE - Hydrologist (Medical): No    Lack of Transportation (Non-Medical): No  Physical Activity: Sufficiently Active (06/09/2022)   Exercise Vital Sign    Days of Exercise per Week: 6 days    Minutes of Exercise per Session: 60 min  Stress: No Stress Concern Present (06/09/2022)   [place]    Feeling of Stress : Not at all  Social Connections: Moderately Integrated (06/09/2022)   Social Connection and Isolation Panel [NHANES]    Frequency of Communication with Friends and Family: More than three times a week    Frequency of Social Gatherings with Friends and Family: More than three times a week    Attends Religious Services: Never    Marine scientist or Organizations: Yes    Attends Music therapist: More than 4 times per year    Marital Status: Married    Tobacco Counseling Counseling given: Not Answered   Clinical Intake:  Pre-visit preparation completed: Yes  Pain : No/denies pain     Nutritional Status: BMI of 19-24  Normal Nutritional Risks: None Diabetes: No  How often do you need to have someone help you when you read instructions, pamphlets, or other  written materials from your doctor or pharmacy?: 1 - Never What is the last grade level you completed in school?: 5 years college  Diabetic?no  Interpreter Needed?: No      Activities of Daily Living    06/09/2022    9:28 AM 06/09/2022    9:21 AM  In your present state of health, do you have any difficulty performing the following activities:  Hearing?  1  Comment  patient wears hearing aid  Vision?  1  Comment  patient wears  reading glasses  Difficulty concentrating or making decisions?  0  Walking or climbing stairs?  0  Dressing or bathing?  0  Doing errands, shopping?  0  Preparing Food and eating ? N   Using the Toilet? N   In the past six months, have you accidently leaked urine? N   Do you have problems with loss of bowel control? N   Managing your Medications? N   Managing your Finances? N   Housekeeping or managing your Housekeeping? N     Patient Care Team: [name], MD as PCP - General (Internal Medicine) Troy Sine, MD as PCP - Cardiology (Cardiology) Franchot Gallo, MD as Consulting Physician (Urology) Katy Apo, MD as Consulting Physician (Ophthalmology) Irene Shipper, MD as Consulting Physician (Gastroenterology) Charlton Haws, [facility] as Pharmacist (Pharmacist)  Indicate any recent Medical Services you may have received from other than [facility] providers in the past year (date may be approximate).     Assessment:   This is a routine wellness examination for Clarence Ware.  Hearing/Vision screen Vision Screening - Comments:: Annual eye exam  Dietary issues and exercise activities discussed: Current Exercise Habits: Structured exercise class, Type of exercise: treadmill;Other - see comments, Time (Minutes): 60, Frequency (Times/Week): 4, Weekly Exercise (Minutes/Week): 240, Intensity: Moderate   Goals Addressed   None   Depression Screen    06/09/2022    9:20 AM 06/09/2022    9:16 AM 01/03/2022   10:36 AM 06/06/2021    2:01 PM  06/06/2021    1:56 PM 03/19/2020    8:25 AM 07/24/2019    8:29 AM  PHQ 2/9 Scores  PHQ - 2 Score 0 0 0 0 0 0 0    Fall Risk    06/09/2022    9:16 AM 06/05/2022    2:24 PM 01/03/2022   10:36 AM 06/06/2021    1:58 PM 03/19/2020    8:27 AM  Fall Risk   Falls in the past year? 1 0 1 0 1  Number falls in past yr: 0  0 0 0  Injury with Fall? 1  1 0 1  Risk for fall due to : No Fall Risks  No Fall Risks  Other (Comment)  Risk for fall due to: Comment     Fell in the woods; had cut  Follow up Falls evaluation completed  Falls evaluation completed Falls evaluation completed Falls evaluation completed    [place]:  Any stairs in or around the home? Yes  If so, are there any without handrails? Yes  Home free of loose throw rugs in walkways, pet beds, electrical cords, etc? Yes  Adequate lighting in your home to reduce risk of falls? Yes   ASSISTIVE DEVICES UTILIZED TO PREVENT FALLS:  Life alert? No  Use of a cane, walker or w/c? No  Grab bars in the bathroom? Yes  Shower chair or bench in shower? Yes  Elevated toilet seat or a handicapped toilet? No   TIMED UP AND GO:  Was the test performed? No .  Length of time to ambulate 10 feet: n/a sec.     Cognitive Function:      03/20/2022    8:26 AM  Montreal Cognitive Assessment   Visuospatial/ Executive (0/5) 5  Naming (0/3) 3  Attention: Read list of digits (0/2) 2  Attention: Read list of letters (0/1) 1  Attention: Serial 7 subtraction starting at 100 (0/3) 3  Language: Repeat phrase (  0/2) 2  Language : Fluency (0/1) 1  Abstraction (0/2) 2  Delayed Recall (0/5) 3  Orientation (0/6) 6  Total 28      06/09/2022    9:24 AM  6CIT Screen  What Year? 0 points  What month? 0 points  What time? 0 points  Count back from 20 0 points  Months in reverse 0 points  Repeat phrase 0 points  Total Score 0 points    Immunizations Immunization History  Administered Date(s) Administered    Hepatitis B 06/09/2013   Influenza, High Dose Seasonal PF 05/02/2019   Influenza-Unspecified 04/09/2014, 04/27/2018, 05/02/2019, 05/06/2021   Moderna SARS-COV2 Booster Vaccination 05/06/2020, 11/07/2020   Moderna Sars-Covid-2 Vaccination 07/08/2019, 08/05/2019   PFIZER(Purple Top)SARS-COV-2 Vaccination 03/29/2021   Pneumococcal Conjugate-13 01/13/2016   Pneumococcal Polysaccharide-23 01/18/2017   Tdap 10/03/2011, 02/23/2020   Zoster Recombinat (Shingrix) 03/02/2018, 05/19/2018   Zoster, Live 06/09/2013    TDAP status: Up to date  Flu Vaccine status: Up to date  Pneumococcal vaccine status: Up to date  Covid-19 vaccine status: Completed vaccines  Qualifies for Shingles Vaccine? Yes   Zostavax completed Yes   Shingrix Completed?: Yes  Screening Tests Health Maintenance  Topic Date Due   INFLUENZA VACCINE  02/07/2022   COVID-19 Vaccine (4 - 2023-24 season) 03/10/2022   Medicare Annual Wellness (AWV)  06/10/2023   COLONOSCOPY (Pts 45-68yr Insurance coverage will need to be confirmed)  03/18/2025   DTaP/Tdap/Td (3 - Td or Tdap) 02/22/2030   Pneumonia Vaccine 72 Years old  Completed   Hepatitis C Screening  Completed   Zoster Vaccines- Shingrix  Completed   HPV VACCINES  Aged Out    Health Maintenance  Health Maintenance Due  Topic Date Due   INFLUENZA VACCINE  02/07/2022   COVID-19 Vaccine (4 - 2023-24 season) 03/10/2022    Colorectal cancer screening: Type of screening: Colonoscopy. Completed 03/19/2015. Repeat every 10 years  Lung Cancer Screening: (Low Dose CT Chest recommended if Age 72-80years, 30 pack-year currently smoking OR have quit w/in 15years.) does not qualify.   Lung Cancer Screening Referral: n/a  Additional Screening:  Hepatitis C Screening: does qualify; Completed 01/13/2016  Vision Screening: Recommended annual ophthalmology exams for early detection of glaucoma and other disorders of the eye. Is the patient up to date with their annual eye  exam?  Yes  Who is the provider or what is the name of the office in which the patient attends annual eye exams? Dr. [facility] If pt is not established with a provider, would they like to be referred to a provider to establish care? No .   Dental Screening: Recommended annual dental exams for proper oral hygiene  Community Resource Referral / Chronic Care Management: CRR required this visit?  No   CCM required this visit?  No      Plan:     I have personally reviewed and noted the following in the patient's chart:   Medical and social history Use of alcohol, tobacco or illicit drugs  Current medications and supplements including opioid prescriptions. Patient is not currently taking opioid prescriptions. Functional ability and status Nutritional status Physical activity Advanced directives List of other physicians Hospitalizations, surgeries, and ER visits in previous 12 months Vitals Screenings to include cognitive, depression, and falls Referrals and appointments  In addition, I have reviewed and discussed with patient certain preventive protocols, quality metrics, and best practice recommendations. A written personalized care plan for preventive services as well as general preventive  health recommendations were provided to patient.     Mylinda Latina, CMA   06/09/2022  Non face to face 30 minutes Nurse Notes:  Clarence Ware , Thank you for taking time to come for your Medicare Wellness Visit. I appreciate your ongoing commitment to your health goals. Please review the following plan we discussed and let me know if I can assist you in the future.   These are the goals we discussed:  Goals      Manage My Medicine     Timeframe:  Long-Range Goal Priority:  Medium Start Date: 07/18/2021                           Expected End Date:  07/18/2022                    Follow Up Date 01/16/2022   - call for medicine refill 2 or 3 days before it runs out -  keep a list of all the medicines I take; vitamins and herbals too - learn to read medicine labels    Why is this important?   These steps will help you keep on track with your medicines.   Notes:      Patient Stated     Continue to eat healthy and more natural, increase my exercise to be routine, travel, enjoy life, family and continue to be active in the 12 step program.        This is a list of the screening recommended for you and due dates:  Health Maintenance  Topic Date Due   Flu Shot  02/07/2022   COVID-19 Vaccine (4 - 2023-24 season) 03/10/2022   Medicare Annual Wellness Visit  06/10/2023   Colon Cancer Screening  03/18/2025   DTaP/Tdap/Td vaccine (3 - Td or Tdap) 02/22/2030   Pneumonia Vaccine  Completed   Hepatitis C Screening: USPSTF Recommendation to screen - Ages 37-79 yo.  Completed   Zoster (Shingles) Vaccine  Completed   HPV Vaccine  Aged Out

## 2022-06-09 NOTE — Patient Instructions (Signed)
Health Maintenance, Male Adopting a healthy lifestyle and getting preventive care are important in promoting health and wellness. Ask your health care provider about: The right schedule for you to have regular tests and exams. Things you can do on your own to prevent diseases and keep yourself healthy. What should I know about diet, weight, and exercise? Eat a healthy diet  Eat a diet that includes plenty of vegetables, fruits, low-fat dairy products, and lean protein. Do not eat a lot of foods that are high in solid fats, added sugars, or sodium. Maintain a healthy weight Body mass index (BMI) is a measurement that can be used to identify possible weight problems. It estimates body fat based on height and weight. Your health care provider can help determine your BMI and help you achieve or maintain a healthy weight. Get regular exercise Get regular exercise. This is one of the most important things you can do for your health. Most adults should: Exercise for at least 150 minutes each week. The exercise should increase your heart rate and make you sweat (moderate-intensity exercise). Do strengthening exercises at least twice a week. This is in addition to the moderate-intensity exercise. Spend less time sitting. Even light physical activity can be beneficial. Watch cholesterol and blood lipids Have your blood tested for lipids and cholesterol at 72 years of age, then have this test every 5 years. You may need to have your cholesterol levels checked more often if: Your lipid or cholesterol levels are high. You are older than 72 years of age. You are at high risk for heart disease. What should I know about cancer screening? Many types of cancers can be detected early and may often be prevented. Depending on your health history and family history, you may need to have cancer screening at various ages. This may include screening for: Colorectal cancer. Prostate cancer. Skin cancer. Lung  cancer. What should I know about heart disease, diabetes, and high blood pressure? Blood pressure and heart disease High blood pressure causes heart disease and increases the risk of stroke. This is more likely to develop in people who have high blood pressure readings or are overweight. Talk with your health care provider about your target blood pressure readings. Have your blood pressure checked: Every 3-5 years if you are 18-39 years of age. Every year if you are 40 years old or older. If you are between the ages of 65 and 75 and are a current or former smoker, ask your health care provider if you should have a one-time screening for abdominal aortic aneurysm (AAA). Diabetes Have regular diabetes screenings. This checks your fasting blood sugar level. Have the screening done: Once every three years after age 45 if you are at a normal weight and have a low risk for diabetes. More often and at a younger age if you are overweight or have a high risk for diabetes. What should I know about preventing infection? Hepatitis B If you have a higher risk for hepatitis B, you should be screened for this virus. Talk with your health care provider to find out if you are at risk for hepatitis B infection. Hepatitis C Blood testing is recommended for: Everyone born from 1945 through 1965. Anyone with known risk factors for hepatitis C. Sexually transmitted infections (STIs) You should be screened each year for STIs, including gonorrhea and chlamydia, if: You are sexually active and are younger than 72 years of age. You are older than 72 years of age and your   health care provider tells you that you are at risk for this type of infection. Your sexual activity has changed since you were last screened, and you are at increased risk for chlamydia or gonorrhea. Ask your health care provider if you are at risk. Ask your health care provider about whether you are at high risk for HIV. Your health care provider  may recommend a prescription medicine to help prevent HIV infection. If you choose to take medicine to prevent HIV, you should first get tested for HIV. You should then be tested every 3 months for as long as you are taking the medicine. Follow these instructions at home: Alcohol use Do not drink alcohol if your health care provider tells you not to drink. If you drink alcohol: Limit how much you have to 0-2 drinks a day. Know how much alcohol is in your drink. In the [place] one drink equals one 12 oz bottle of beer (355 mL), one 5 oz glass of wine (148 mL), or one 1 oz glass of hard liquor (44 mL). Lifestyle Do not use any products that contain nicotine or tobacco. These products include cigarettes, chewing tobacco, and vaping devices, such as e-cigarettes. If you need help quitting, ask your health care provider. Do not use street drugs. Do not share needles. Ask your health care provider for help if you need support or information about quitting drugs. General instructions Schedule regular health, dental, and eye exams. Stay current with your vaccines. Tell your health care provider if: You often feel depressed. You have ever been abused or do not feel safe at home. Summary Adopting a healthy lifestyle and getting preventive care are important in promoting health and wellness. Follow your health care provider's instructions about healthy diet, exercising, and getting tested or screened for diseases. Follow your health care provider's instructions on monitoring your cholesterol and blood pressure. This information is not intended to replace advice given to you by your health care provider. Make sure you discuss any questions you have with your health care provider. Document Revised: 11/15/2020 Document Reviewed: 11/15/2020 Elsevier Patient Education  2023 [employer]  

## 2022-07-19 ENCOUNTER — Encounter: Payer: Self-pay | Admitting: Cardiovascular Disease

## 2022-07-20 ENCOUNTER — Other Ambulatory Visit: Payer: Self-pay | Admitting: Internal Medicine

## 2022-07-20 DIAGNOSIS — R7303 Prediabetes: Secondary | ICD-10-CM

## 2022-07-20 DIAGNOSIS — E785 Hyperlipidemia, unspecified: Secondary | ICD-10-CM

## 2022-07-20 DIAGNOSIS — I10 Essential (primary) hypertension: Secondary | ICD-10-CM

## 2022-08-03 DIAGNOSIS — Z961 Presence of intraocular lens: Secondary | ICD-10-CM | POA: Diagnosis not present

## 2022-08-03 DIAGNOSIS — H35373 Puckering of macula, bilateral: Secondary | ICD-10-CM | POA: Diagnosis not present

## 2022-08-24 ENCOUNTER — Encounter: Payer: Self-pay | Admitting: Neurology

## 2022-08-24 ENCOUNTER — Ambulatory Visit: Payer: PPO | Admitting: Neurology

## 2022-08-24 VITALS — BP 128/79 | HR 59 | Ht 74.0 in | Wt 200.4 lb

## 2022-08-24 DIAGNOSIS — G20A1 Parkinson's disease without dyskinesia, without mention of fluctuations: Secondary | ICD-10-CM | POA: Diagnosis not present

## 2022-08-24 DIAGNOSIS — G4763 Sleep related bruxism: Secondary | ICD-10-CM | POA: Diagnosis not present

## 2022-08-24 DIAGNOSIS — R0683 Snoring: Secondary | ICD-10-CM

## 2022-08-24 DIAGNOSIS — G478 Other sleep disorders: Secondary | ICD-10-CM | POA: Diagnosis not present

## 2022-08-24 DIAGNOSIS — G4752 REM sleep behavior disorder: Secondary | ICD-10-CM | POA: Diagnosis not present

## 2022-08-24 MED ORDER — CLONAZEPAM 0.25 MG PO TBDP: 0.2500 mg | ORAL_TABLET | Freq: Two times a day (BID) | ORAL | 0 refills | Status: DC

## 2022-08-24 NOTE — Progress Notes (Signed)
Provider:  Larey Seat, MD  Primary Care Physician:  Clarence Lima, MD [place] [address]     Referring Provider: Janith [place], [place],  [place] [address]          Chief Complaint according to patient   Patient presents with:     New Patient (Initial Visit)           HISTORY OF PRESENT ILLNESS:  Clarence Ware is a 72 y.o. male patient who is here for revisit 08/24/2022 for a sleep medicine revisit .  Chief concern according to patient :  REM BD ,  less snoring - but had moved to different bedroom. Since 2017 he lost 25 pounds.    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 Still having morning headache and now reports ongoing snoring.  He is using a dental device by dr Ron Parker in 2021 but this changed his bite. Untreated now.  Still first degree heart block.  Likes to have klonopin prn use.      Clarence Ware is a 72 y.o. male , 03-20-2022, he never took klonopin, here to see if he has any associated symptoms of cognitive decline , of Parkinsonism. Has been taking melatonin.  Has some morning a headache, congested nasal passage. Reports occasional name recall delay, but it " floats up". He is in a [employer] study. He had a first visit 04-2021 at [facility] [place] scan was done there. 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 Movement disorder MD assessed walking speed.  Tomorrow he has a phone call interview and follow up.           Last seen on 03-16-2021, I have the pleasure of seeing [name] and this is the first time in a while.  He states that he had no need to use Klonopin over the last 12 months has not fallen out of bed and since he sleeps in a separate bedroom from his wife he is not sure that  he has any enactment at all.  He feels that he is well controlled on melatonin only at this time.  As we know he had been SNRI for a while but we never found a significant amount of apnea warranting any intervention.  He has been doing okay with a mouthguard which controlled his snoring but unfortunately has led to a shift in his teeth.  His fatigue severity scale today was endorsed at only 14 points and his Epworth sleepiness score at 3 points.  The patient has been diagnosed with REM behavior disorder and he is now enrolled in the [employer] study.  He enrolled online, he performed a sniff test in which he must have not done well, and is now scheduled- in [place] -to undergo a DAT scan.    Last seen in a Rv after sleep study on 03-16-2020.  He underwent a study on 25 January 2020 which showed no evidence of obstructive sleep apnea and there was no clinically relevant periodic limb movement especially not in REM sleep.  Normal EKG no drop in oxygen and treatment suggestions were made to stop with melatonin or an SSRI and to hold off on Klonopin  as it is a potentially habit-forming medication.  However when he travels or may have for some reason unusual circumstances more stress sleep deprivation etc. it may well be worth having Klonopin as a rescue medication for that night.  The couple now sleeps in different rooms because of the REM enactment. He is still shouting.    He takes 3 mg melatonin, which may have reduced the physical activity.  He can use klonopin for rescue in certain situations, in light of his substance abuse history.   Review of Systems: Out of a complete 14 system review, the patient complains of only the following symptoms, and all other reviewed systems are negative.:   Drooling at night.  Decreased sense of smell, had covid twice, PD related?  Abnormal DAT scan.    How likely are you to doze in the following situations: 0 = not likely, 1 = slight  chance, 2 = moderate chance, 3 = high chance   Sitting and Reading? Watching Television? Sitting inactive in a public place (theater or meeting)? As a passenger in a car for an hour without a break? Lying down in the afternoon when circumstances permit? Sitting and talking to someone? Sitting quietly after lunch without alcohol? In a car, while stopped for a few minutes in traffic?   Total = 4/ 24 points   FSS endorsed at 17/ 63 points.   Social History   Socioeconomic History   Marital status: Married    Spouse name: Clarence Ware   Number of children: 2   Years of education: Not on file   Highest education level: Not on file  Occupational History   Occupation: Pharmacist part-time  Tobacco Use   Smoking status: Former    Packs/day: 0.25    Years: 1.00    Total pack years: 0.25    Types: Cigarettes    Quit date: 07/10/1992    Years since quitting: 30.1   Smokeless tobacco: Never  Vaping Use   Vaping Use: Never used  Substance and Sexual Activity   Alcohol use: No    Alcohol/week: 0.0 standard drinks of alcohol   Drug use: Not Currently    Comment: sobrity for 10 years   Sexual activity: Yes  Other Topics Concern   Not on file  Social History Narrative   Married. Education: [employer].   Exercise: some   Caffeine- coffee 2 c daily   Social Determinants of Health   Financial Resource Strain: Low Risk  (06/09/2022)   Overall Financial Resource Strain (CARDIA)    Difficulty of Paying Living Expenses: Not hard at all  Food Insecurity: No Food Insecurity (06/09/2022)   Hunger Vital Sign    Worried About Running Out of Food in the Last Year: Never true    Ran Out of Food in the Last Year: Never true  Transportation Needs: No Transportation Needs (06/09/2022)   PRAPARE - Hydrologist (Medical): No    Lack of Transportation (Non-Medical): No  Physical Activity: Sufficiently Active (06/09/2022)   Exercise Vital Sign    Days of Exercise per Week: 6  days    Minutes of Exercise per Session: 60 min  Stress: No Stress Concern Present (06/09/2022)   [place]    Feeling of Stress : Not at all  Social Connections: Moderately Integrated (06/09/2022)   Social Connection and Isolation Panel [NHANES]    Frequency of Communication with Friends and Family: More than three times  a week    Frequency of Social Gatherings with Friends and Family: More than three times a week    Attends Religious Services: Never    Marine scientist or Organizations: Yes    Attends Music therapist: More than 4 times per year    Marital Status: Married    Family History  Problem Relation Age of Onset   Heart disease Mother    Stroke Mother    Heart disease Father 22       MI   Hypertension Father    Hypertension Brother    Cancer Brother    Diabetes Brother    Heart disease Brother    Hyperlipidemia Brother    Cancer Brother    Hyperlipidemia Sister    Coronary artery disease Brother 24       CABG   Colon cancer Paternal Grandmother     Past Medical History:  Diagnosis Date   Allergy    First degree heart block    Hyperlipidemia    Hypertension    REM sleep behavior disorder    Substance abuse ([place])     Past Surgical History:  Procedure Laterality Date   INGUINAL HERNIA REPAIR Right 03/10/2015   Procedure: OPEN RIGHT INGUINAL HERNIA REPAIR;  Surgeon: Johnathan Hausen, MD;  Location: [facility];  Service: General;  Laterality: Right;  With MESH   VASECTOMY       Current Outpatient Medications on File Prior to Visit  Medication Sig Dispense Refill   alfuzosin (UROXATRAL) 10 MG 24 hr tablet TAKE ONE TABLET BY MOUTH EVERY MORNING 90 tablet 1   aspirin EC 81 MG tablet Take 81 mg by mouth daily.     atorvastatin (LIPITOR) 40 MG tablet TAKE ONE TABLET BY MOUTH ONCE DAILY 90 tablet 3   celecoxib (CELEBREX) 100 MG capsule Take 1 capsule (100 mg total) by mouth daily. 90  capsule 0   cetirizine (ZYRTEC) 10 MG tablet Take 10 mg by mouth as directed. PRN     Cholecalciferol (VITAMIN D) 2000 units CAPS Take 1 capsule by mouth daily.     ezetimibe (ZETIA) 10 MG tablet TAKE ONE TABLET BY MOUTH ONCE DAILY 90 tablet 3   melatonin 3 MG TABS tablet Take 2 tablets (6 mg total) by mouth at bedtime. (Patient taking differently: Take 3 mg by mouth at bedtime.) 180 tablet 1   No current facility-administered medications on file prior to visit.    Allergies  Allergen Reactions   Nexium [Esomeprazole Magnesium]     Awful regurgitation     DIAGNOSTIC DATA (LABS, IMAGING, TESTING) - I reviewed patient records, labs, notes, testing and imaging myself where available.  Lab Results  Component Value Date   WBC 5.0 12/13/2021   HGB 12.9 (L) 12/13/2021   HCT 38.9 (L) 12/13/2021   MCV 91.4 12/13/2021   PLT 197.0 12/13/2021      Component Value Date/Time   NA 139 12/13/2021 1150   NA 141 10/24/2017 0809   K 4.5 12/13/2021 1150   CL 104 12/13/2021 1150   CO2 29 12/13/2021 1150   GLUCOSE 95 12/13/2021 1150   BUN 20 12/13/2021 1150   BUN 19 10/24/2017 0809   CREATININE 0.94 12/13/2021 1150   CREATININE 0.91 03/02/2020 0856   CALCIUM 9.5 12/13/2021 1150   PROT 6.5 09/01/2021 1140   PROT 6.8 10/24/2017 0809   ALBUMIN 4.3 09/01/2021 1140   ALBUMIN 4.5 10/24/2017 0809   AST 29 09/01/2021 1140  ALT 32 09/01/2021 1140   ALKPHOS 85 09/01/2021 1140   BILITOT 0.8 09/01/2021 1140   BILITOT 0.5 10/24/2017 0809   GFRNONAA 86 03/02/2020 0856   GFRAA 99 03/02/2020 0856   Lab Results  Component Value Date   CHOL 123 09/01/2021   HDL 48.10 09/01/2021   LDLCALC 66 09/01/2021   TRIG 43.0 09/01/2021   CHOLHDL 3 09/01/2021   Lab Results  Component Value Date   HGBA1C 6.2 09/01/2021   Lab Results  Component Value Date   [id number] 08/05/2020   Lab Results  Component Value Date   TSH 1.48 09/01/2021    PHYSICAL EXAM:  Today's Vitals   08/24/22 1530  BP:  128/79  Pulse: (!) 59  Weight: 200 lb 6.4 oz (90.9 kg)  Height: 6' 2"$  (1.88 m)   Body mass index is 25.73 kg/m.   Wt Readings from Last 3 Encounters:  08/24/22 200 lb 6.4 oz (90.9 kg)  06/09/22 192 lb (87.1 kg)  05/30/22 199 lb (90.3 kg)     Ht Readings from Last 3 Encounters:  08/24/22 6' 2"$  (1.88 m)  06/09/22 6' 2"$  (1.88 m)  05/30/22 6' 2"$  (1.88 m)      General: The patient is awake, alert and appears not in acute distress. The patient is well groomed. Head: Normocephalic, atraumatic. Neck is supple.  Mallampati 3,  neck circumference:16 inches . Nasal airflow  patent.  Retrognathia is not seen. Facial hair.  Dental status: biological  Cardiovascular:  Regular rate and cardiac rhythm by pulse,  without distended neck veins. Respiratory: Lungs are clear to auscultation.  Skin:  Without evidence of ankle edema, or rash. Trunk: The patient's posture is erect.   NEUROLOGIC EXAM: The patient is awake and alert, oriented to place and time.   Memory subjective described as intact.  Attention span & concentration ability appears normal.  Speech is fluent,  without  dysarthria, dysphonia or aphasia.  Mood and affect are appropriate.   Cranial nerves: no loss of smell or taste reported  Pupils are equal and briskly reactive to light.  Extraocular movements in vertical and horizontal planes were intact and without nystagmus. No Diplopia. Visual fields by finger perimetry are intact. Hearing was intact to soft voice and finger rubbing.    Facial sensation intact to fine touch. Facial motor strength is symmetric and tongue and uvula move midline.  Neck ROM : rotation, tilt and flexion extension were normal for age and shoulder shrug was symmetrical.    Motor exam:  Symmetric bulk, tone and ROM.   Normal tone without cog- wheeling, symmetric grip strength - strong.   Sensory:  Fine touch, pinprick and vibration were normal.  Proprioception tested in the upper extremities was  normal.   Coordination: Rapid alternating movements in the fingers/hands were of normal speed.   The Finger-to-nose maneuver was intact without evidence of ataxia, dysmetria or tremor.   Gait and station: Patient could rise unassisted from a seated position, walked without assistive device.  Stance is of normal width/ base and the patient turned with 3-4  steps.  Toe and heel walk were deferred.  Deep tendon reflexes: in the  upper and lower extremities are symmetric and intact.  Babinski response was deferred .    ASSESSMENT AND PLAN 72 y.o. year old male  here with:    1) snoring and not OSA - UARS-  used dental device until last year but developed dental shifting, now untreated, in the meantime he  lost 25 pounds - even less likely to have apnea now. Wants to have snoring treatment.  We discussed a soft dental mouth guard and this swill help with bruxism. He got the information for online purchase   2) sleep related headaches still present. Why ? UARS ?   3) PD with REM BD - followed by Leanora Cover . I will refill Klonopin for the night when travelling.    I plan to follow up either personally or through our NP within 6 months.   I would like to thank Clarence Lima, MD and Clarence [place], [place],  [place] [address] for allowing me to meet with and to take care of this pleasant patient.   CC: I will share my notes with PCP .  After spending a total time of  20  minutes face to face and additional time for physical and neurologic examination, review of laboratory studies,  personal review of imaging studies, reports and results of other testing and review of referral information / records as far as provided in visit,   Electronically signed by: Larey Seat, MD 08/24/2022 3:58 PM  [facility] Board certified by The [employer] of Sleep Medicine and Diplomate of the [employer] of  Sleep Medicine. Board certified In Neurology through the [place], Fellow of the [employer] of Neurology. Medical Director of [facility].

## 2022-08-28 ENCOUNTER — Encounter: Payer: Self-pay | Admitting: Neurology

## 2022-08-28 MED ORDER — CLONAZEPAM 0.5 MG PO TABS: 0.2500 mg | ORAL_TABLET | Freq: Every evening | ORAL | 2 refills | Status: DC | PRN

## 2022-09-04 ENCOUNTER — Encounter: Payer: PPO | Admitting: Internal Medicine

## 2022-09-08 ENCOUNTER — Encounter: Payer: Self-pay | Admitting: Internal Medicine

## 2022-09-11 ENCOUNTER — Other Ambulatory Visit (INDEPENDENT_AMBULATORY_CARE_PROVIDER_SITE_OTHER): Payer: PPO

## 2022-09-11 ENCOUNTER — Other Ambulatory Visit: Payer: Self-pay | Admitting: Internal Medicine

## 2022-09-11 ENCOUNTER — Telehealth: Payer: Self-pay | Admitting: Internal Medicine

## 2022-09-11 DIAGNOSIS — R7303 Prediabetes: Secondary | ICD-10-CM | POA: Diagnosis not present

## 2022-09-11 DIAGNOSIS — E785 Hyperlipidemia, unspecified: Secondary | ICD-10-CM | POA: Diagnosis not present

## 2022-09-11 DIAGNOSIS — N401 Enlarged prostate with lower urinary tract symptoms: Secondary | ICD-10-CM

## 2022-09-11 DIAGNOSIS — I10 Essential (primary) hypertension: Secondary | ICD-10-CM | POA: Diagnosis not present

## 2022-09-11 LAB — CBC WITH DIFFERENTIAL/PLATELET
Basophils Absolute: 0 10*3/uL (ref 0.0–0.1)
Basophils Relative: 0.3 % (ref 0.0–3.0)
Eosinophils Absolute: 0.4 10*3/uL (ref 0.0–0.7)
Eosinophils Relative: 5.8 % — ABNORMAL HIGH (ref 0.0–5.0)
HCT: 42.6 % (ref 39.0–52.0)
Hemoglobin: 14.4 g/dL (ref 13.0–17.0)
Lymphocytes Relative: 23.8 % (ref 12.0–46.0)
Lymphs Abs: 1.5 10*3/uL (ref 0.7–4.0)
MCHC: 33.8 g/dL (ref 30.0–36.0)
MCV: 91.1 fl (ref 78.0–100.0)
Monocytes Absolute: 0.8 10*3/uL (ref 0.1–1.0)
Monocytes Relative: 13.4 % — ABNORMAL HIGH (ref 3.0–12.0)
Neutro Abs: 3.5 10*3/uL (ref 1.4–7.7)
Neutrophils Relative %: 56.7 % (ref 43.0–77.0)
Platelets: 184 10*3/uL (ref 150.0–400.0)
RBC: 4.68 Mil/uL (ref 4.22–5.81)
RDW: 13.3 % (ref 11.5–15.5)
WBC: 6.2 10*3/uL (ref 4.0–10.5)

## 2022-09-11 LAB — LIPID PANEL
Cholesterol: 125 mg/dL (ref 0–200)
HDL: 51.7 mg/dL (ref >39.00)
LDL Cholesterol: 64 mg/dL (ref 0–99)
NonHDL: 73.44
Total CHOL/HDL Ratio: 2
Triglycerides: 49 mg/dL (ref 0.0–149.0)
VLDL: 9.8 mg/dL (ref 0.0–40.0)

## 2022-09-11 LAB — BASIC METABOLIC PANEL
BUN: 20 mg/dL (ref 6–23)
CO2: 28 mEq/L (ref 19–32)
Calcium: 9.7 mg/dL (ref 8.4–10.5)
Chloride: 104 mEq/L (ref 96–112)
Creatinine, Ser: 0.93 mg/dL (ref 0.40–1.50)
GFR: 82.22 mL/min (ref >60.00)
Glucose, Bld: 96 mg/dL (ref 70–99)
Potassium: 4.1 mEq/L (ref 3.5–5.1)
Sodium: 140 mEq/L (ref 135–145)

## 2022-09-11 LAB — URINALYSIS, ROUTINE W REFLEX MICROSCOPIC
Bilirubin Urine: NEGATIVE
Hgb urine dipstick: NEGATIVE
Ketones, ur: NEGATIVE
Leukocytes,Ua: NEGATIVE
Nitrite: NEGATIVE
RBC / HPF: NONE SEEN (ref 0–?)
Specific Gravity, Urine: 1.02 (ref 1.000–1.030)
Total Protein, Urine: NEGATIVE
Urine Glucose: NEGATIVE
Urobilinogen, UA: 0.2 (ref 0.0–1.0)
pH: 8 (ref 5.0–8.0)

## 2022-09-11 LAB — HEPATIC FUNCTION PANEL
ALT: 29 U/L (ref 0–53)
AST: 25 U/L (ref 0–37)
Albumin: 4 g/dL (ref 3.5–5.2)
Alkaline Phosphatase: 80 U/L (ref 39–117)
Bilirubin, Direct: 0.1 mg/dL (ref 0.0–0.3)
Total Bilirubin: 0.5 mg/dL (ref 0.2–1.2)
Total Protein: 6.6 g/dL (ref 6.0–8.3)

## 2022-09-11 LAB — TSH: TSH: 2.57 u[IU]/mL (ref 0.35–5.50)

## 2022-09-11 LAB — HEMOGLOBIN A1C: Hgb A1c MFr Bld: 6.3 % (ref 4.6–6.5)

## 2022-09-11 MED ORDER — ALFUZOSIN HCL ER 10 MG PO TB24: 10.0000 mg | ORAL_TABLET | Freq: Every morning | ORAL | 1 refills | Status: DC

## 2022-09-11 MED ORDER — ATORVASTATIN CALCIUM 40 MG PO TABS: 40.0000 mg | ORAL_TABLET | Freq: Every day | ORAL | 1 refills | Status: DC

## 2022-09-11 NOTE — Telephone Encounter (Signed)
[facility] called requesting for these 2 Rx to be sent in:   alfuzosin (UROXATRAL) 10 MG 24 hr tablet   atorvastatin (LIPITOR) 40 MG tablet   [facility] - [place], [place] - [address] Phone: [phone]  Fax: [phone]

## 2022-09-12 ENCOUNTER — Encounter: Payer: Self-pay | Admitting: Internal Medicine

## 2022-09-12 ENCOUNTER — Ambulatory Visit (INDEPENDENT_AMBULATORY_CARE_PROVIDER_SITE_OTHER): Payer: PPO | Admitting: Internal Medicine

## 2022-09-12 ENCOUNTER — Ambulatory Visit (INDEPENDENT_AMBULATORY_CARE_PROVIDER_SITE_OTHER): Payer: PPO

## 2022-09-12 VITALS — BP 128/82 | HR 60 | Temp 97.9°F | Resp 16 | Ht 74.0 in | Wt 197.0 lb

## 2022-09-12 DIAGNOSIS — I10 Essential (primary) hypertension: Secondary | ICD-10-CM | POA: Diagnosis not present

## 2022-09-12 DIAGNOSIS — E785 Hyperlipidemia, unspecified: Secondary | ICD-10-CM

## 2022-09-12 DIAGNOSIS — R7989 Other specified abnormal findings of blood chemistry: Secondary | ICD-10-CM | POA: Diagnosis not present

## 2022-09-12 DIAGNOSIS — S99921A Unspecified injury of right foot, initial encounter: Secondary | ICD-10-CM | POA: Insufficient documentation

## 2022-09-12 DIAGNOSIS — R0609 Other forms of dyspnea: Secondary | ICD-10-CM | POA: Insufficient documentation

## 2022-09-12 DIAGNOSIS — N138 Other obstructive and reflux uropathy: Secondary | ICD-10-CM

## 2022-09-12 DIAGNOSIS — Z23 Encounter for immunization: Secondary | ICD-10-CM | POA: Diagnosis not present

## 2022-09-12 DIAGNOSIS — Z0000 Encounter for general adult medical examination without abnormal findings: Secondary | ICD-10-CM

## 2022-09-12 DIAGNOSIS — N401 Enlarged prostate with lower urinary tract symptoms: Secondary | ICD-10-CM

## 2022-09-12 DIAGNOSIS — R7303 Prediabetes: Secondary | ICD-10-CM | POA: Diagnosis not present

## 2022-09-12 DIAGNOSIS — M79671 Pain in right foot: Secondary | ICD-10-CM | POA: Diagnosis not present

## 2022-09-12 LAB — TROPONIN I (HIGH SENSITIVITY): High Sens Troponin I: 6 ng/L (ref 2–17)

## 2022-09-12 LAB — PSA: PSA: 2.22 ng/mL (ref 0.10–4.00)

## 2022-09-12 LAB — BRAIN NATRIURETIC PEPTIDE: Pro B Natriuretic peptide (BNP): 169 pg/mL — ABNORMAL HIGH (ref 0.0–100.0)

## 2022-09-12 NOTE — Progress Notes (Signed)
Subjective:  Patient ID: Clarence Ware, male    DOB: 06/03/1951  Age: 72 y.o. MRN: [id number]  CC: Annual Exam and Hyperlipidemia   HPI Clarence Ware presents for a CPX and f/up ---  He swims one mile/day and has developed DOE. He denies CP, diaphoresis, edema. He injured his right foot 2 weeks ago - he has pain at the base of the 3rd and 4th toes.  Outpatient Medications Prior to Visit  Medication Sig Dispense Refill   alfuzosin (UROXATRAL) 10 MG 24 hr tablet Take 1 tablet (10 mg total) by mouth every morning. 90 tablet 1   aspirin EC 81 MG tablet Take 81 mg by mouth daily.     atorvastatin (LIPITOR) 40 MG tablet Take 1 tablet (40 mg total) by mouth daily. 90 tablet 1   celecoxib (CELEBREX) 100 MG capsule Take 1 capsule (100 mg total) by mouth daily. 90 capsule 0   cetirizine (ZYRTEC) 10 MG tablet Take 10 mg by mouth as directed. PRN     Cholecalciferol (VITAMIN D) 2000 units CAPS Take 1 capsule by mouth daily.     clonazePAM (KLONOPIN) 0.5 MG tablet Take 0.5 tablets (0.25 mg total) by mouth at bedtime as needed for anxiety. 45 tablet 2   ezetimibe (ZETIA) 10 MG tablet TAKE ONE TABLET BY MOUTH ONCE DAILY 90 tablet 3   melatonin 3 MG TABS tablet Take 2 tablets (6 mg total) by mouth at bedtime. (Patient taking differently: Take 3 mg by mouth at bedtime.) 180 tablet 1   No facility-administered medications prior to visit.    ROS Review of Systems  Constitutional: Negative.  Negative for chills, diaphoresis and fatigue.  HENT: Negative.    Respiratory:  Positive for shortness of breath (DOE). Negative for cough, chest tightness and wheezing.   Cardiovascular:  Negative for chest pain, palpitations and leg swelling.  Gastrointestinal:  Negative for abdominal pain, constipation, diarrhea, nausea and vomiting.  Genitourinary: Negative.  Negative for difficulty urinating.  Musculoskeletal:  Positive for arthralgias. Negative for myalgias.  Skin: Negative.   Neurological:  Negative.  Negative for dizziness, weakness and light-headedness.  Hematological:  Negative for adenopathy. Does not bruise/bleed easily.  Psychiatric/Behavioral: Negative.      Objective:  BP 128/82 (BP Location: Left Arm, Patient Position: Sitting, Cuff Size: Normal)   Pulse 60   Temp 97.9 F (36.6 C) (Oral)   Resp 16   Ht '6\' 2"'$  (1.88 m)   Wt 197 lb (89.4 kg)   SpO2 97%   BMI 25.29 kg/m   BP Readings from Last 3 Encounters:  09/12/22 128/82  08/24/22 128/79  05/30/22 132/78    Wt Readings from Last 3 Encounters:  09/12/22 197 lb (89.4 kg)  08/24/22 200 lb 6.4 oz (90.9 kg)  06/09/22 192 lb (87.1 kg)    Physical Exam Vitals reviewed.  HENT:     Nose: Nose normal.     Mouth/Throat:     Mouth: Mucous membranes are moist.  Eyes:     General: No scleral icterus.    Conjunctiva/sclera: Conjunctivae normal.  Cardiovascular:     Rate and Rhythm: Regular rhythm. Bradycardia present.     Pulses:          Dorsalis pedis pulses are 1+ on the right side and 1+ on the left side.       Posterior tibial pulses are 1+ on the right side and 1+ on the left side.     Heart sounds: Normal heart sounds, S1  normal and S2 normal. No murmur heard.    Comments: EKG--- SB, 59 bpm No LVH, Q waves, or ST/T wave changes Pulmonary:     Effort: Pulmonary effort is normal.     Breath sounds: No stridor. No wheezing, rhonchi or rales.  Abdominal:     General: Abdomen is flat.     Palpations: There is no mass.     Tenderness: There is no abdominal tenderness. There is no guarding.     Hernia: No hernia is present.  Musculoskeletal:     Cervical back: Neck supple.     Right lower leg: No edema.     Left lower leg: No edema.     Right foot: Normal range of motion. No deformity.     Left foot: Normal range of motion. No deformity.  Feet:     Right foot:     Skin integrity: Skin integrity normal.     Left foot:     Skin integrity: Skin integrity normal.  Lymphadenopathy:     Cervical: No  cervical adenopathy.  Skin:    General: Skin is warm and dry.  Neurological:     General: No focal deficit present.     Mental Status: He is alert. Mental status is at baseline.  Psychiatric:        Mood and Affect: Mood normal.        Behavior: Behavior normal.        Thought Content: Thought content normal.        Judgment: Judgment normal.     Lab Results  Component Value Date   WBC 6.2 09/11/2022   HGB 14.4 09/11/2022   HCT 42.6 09/11/2022   PLT 184.0 09/11/2022   GLUCOSE 96 09/11/2022   CHOL 125 09/11/2022   TRIG 49.0 09/11/2022   HDL 51.70 09/11/2022   LDLCALC 64 09/11/2022   ALT 29 09/11/2022   AST 25 09/11/2022   NA 140 09/11/2022   K 4.1 09/11/2022   CL 104 09/11/2022   CREATININE 0.93 09/11/2022   BUN 20 09/11/2022   CO2 28 09/11/2022   TSH 2.57 09/11/2022   PSA 2.22 09/12/2022   HGBA1C 6.3 09/11/2022   DG Foot Complete Right  Result Date: 09/12/2022 CLINICAL DATA:  Right foot pain after injury 10 days ago. EXAM: RIGHT FOOT COMPLETE - 3+ VIEW COMPARISON:  May 30, 2022. FINDINGS: There is no evidence of acute fracture or dislocation. There is no evidence of arthropathy. Old healed second proximal phalangeal fracture is noted. Soft tissues are unremarkable. IMPRESSION: No acute abnormality seen. Electronically Signed   By: Marijo Conception M.D.   On: 09/12/2022 12:23      Assessment & Plan:   Clarence Ware was seen today for annual exam and hyperlipidemia.  Diagnoses and all orders for this visit:  Essential hypertension- His BP is well controlled. -     EKG 12-Lead  Prediabetes- He is working on his lifestyle modifications.  Hyperlipidemia with target LDL less than 100- LDL goal achieved. Doing well on the statin   Routine general medical examination at a health care facility- Exam completed, labs reviewed, vaccines reviewed and updated, cancer screenings addressed, patient education material was given.  DOE (dyspnea on exertion)- I recommended that he  undergo a coronary calcium score to gauge his risk for coronary artery disease.  His EKG is normal.  Troponin is negative.  BNP is slightly elevated. -     Cancel: CT CARDIAC SCORING (SELF PAY ONLY); Future -  Troponin I (High Sensitivity); Future -     Brain natriuretic peptide; Future -     Brain natriuretic peptide -     Troponin I (High Sensitivity) -     CT CARDIAC SCORING ([facility] LOCATIONS ONLY); Future -     ECHOCARDIOGRAM COMPLETE; Future  Need for vaccination -     Pneumococcal polysaccharide vaccine 23-valent greater than or equal to 2yo subcutaneous/IM  Right foot injury, initial encounter-x-ray and exam are normal. -     DG Foot Complete Right; Future  BPH with urinary obstruction- His PSA is normal. -     PSA; Future -     PSA  Elevated brain natriuretic peptide (BNP) level- Will evaluate for valvular heart disease, CHF, and wall motion abnormalities. -     ECHOCARDIOGRAM COMPLETE; Future   I am having Clarence Caprice Beaver "Clarence Ware" maintain his cetirizine, Vitamin D, aspirin EC, melatonin, ezetimibe, celecoxib, clonazePAM, atorvastatin, and alfuzosin.  No orders of the defined types were placed in this encounter.    Follow-up: Return in about 6 months (around 03/15/2023).  [name], MD

## 2022-09-12 NOTE — Patient Instructions (Signed)
Health Maintenance, Male Adopting a healthy lifestyle and getting preventive care are important in promoting health and wellness. Ask your health care provider about: The right schedule for you to have regular tests and exams. Things you can do on your own to prevent diseases and keep yourself healthy. What should I know about diet, weight, and exercise? Eat a healthy diet  Eat a diet that includes plenty of vegetables, fruits, low-fat dairy products, and lean protein. Do not eat a lot of foods that are high in solid fats, added sugars, or sodium. Maintain a healthy weight Body mass index (BMI) is a measurement that can be used to identify possible weight problems. It estimates body fat based on height and weight. Your health care provider can help determine your BMI and help you achieve or maintain a healthy weight. Get regular exercise Get regular exercise. This is one of the most important things you can do for your health. Most adults should: Exercise for at least 150 minutes each week. The exercise should increase your heart rate and make you sweat (moderate-intensity exercise). Do strengthening exercises at least twice a week. This is in addition to the moderate-intensity exercise. Spend less time sitting. Even light physical activity can be beneficial. Watch cholesterol and blood lipids Have your blood tested for lipids and cholesterol at 73 years of age, then have this test every 5 years. You may need to have your cholesterol levels checked more often if: Your lipid or cholesterol levels are high. You are older than 73 years of age. You are at high risk for heart disease. What should I know about cancer screening? Many types of cancers can be detected early and may often be prevented. Depending on your health history and family history, you may need to have cancer screening at various ages. This may include screening for: Colorectal cancer. Prostate cancer. Skin cancer. Lung  cancer. What should I know about heart disease, diabetes, and high blood pressure? Blood pressure and heart disease High blood pressure causes heart disease and increases the risk of stroke. This is more likely to develop in people who have high blood pressure readings or are overweight. Talk with your health care provider about your target blood pressure readings. Have your blood pressure checked: Every 3-5 years if you are 18-39 years of age. Every year if you are 40 years old or older. If you are between the ages of 65 and 75 and are a current or former smoker, ask your health care provider if you should have a one-time screening for abdominal aortic aneurysm (AAA). Diabetes Have regular diabetes screenings. This checks your fasting blood sugar level. Have the screening done: Once every three years after age 45 if you are at a normal weight and have a low risk for diabetes. More often and at a younger age if you are overweight or have a high risk for diabetes. What should I know about preventing infection? Hepatitis B If you have a higher risk for hepatitis B, you should be screened for this virus. Talk with your health care provider to find out if you are at risk for hepatitis B infection. Hepatitis C Blood testing is recommended for: Everyone born from 1945 through 1965. Anyone with known risk factors for hepatitis C. Sexually transmitted infections (STIs) You should be screened each year for STIs, including gonorrhea and chlamydia, if: You are sexually active and are younger than 73 years of age. You are older than 73 years of age and your   health care provider tells you that you are at risk for this type of infection. Your sexual activity has changed since you were last screened, and you are at increased risk for chlamydia or gonorrhea. Ask your health care provider if you are at risk. Ask your health care provider about whether you are at high risk for HIV. Your health care provider  may recommend a prescription medicine to help prevent HIV infection. If you choose to take medicine to prevent HIV, you should first get tested for HIV. You should then be tested every 3 months for as long as you are taking the medicine. Follow these instructions at home: Alcohol use Do not drink alcohol if your health care provider tells you not to drink. If you drink alcohol: Limit how much you have to 0-2 drinks a day. Know how much alcohol is in your drink. In the [place] one drink equals one 12 oz bottle of beer (355 mL), one 5 oz glass of wine (148 mL), or one 1 oz glass of hard liquor (44 mL). Lifestyle Do not use any products that contain nicotine or tobacco. These products include cigarettes, chewing tobacco, and vaping devices, such as e-cigarettes. If you need help quitting, ask your health care provider. Do not use street drugs. Do not share needles. Ask your health care provider for help if you need support or information about quitting drugs. General instructions Schedule regular health, dental, and eye exams. Stay current with your vaccines. Tell your health care provider if: You often feel depressed. You have ever been abused or do not feel safe at home. Summary Adopting a healthy lifestyle and getting preventive care are important in promoting health and wellness. Follow your health care provider's instructions about healthy diet, exercising, and getting tested or screened for diseases. Follow your health care provider's instructions on monitoring your cholesterol and blood pressure. This information is not intended to replace advice given to you by your health care provider. Make sure you discuss any questions you have with your health care provider. Document Revised: 11/15/2020 Document Reviewed: 11/15/2020 Elsevier Patient Education  2023 Elsevier Inc.  

## 2022-09-16 ENCOUNTER — Other Ambulatory Visit: Payer: Self-pay | Admitting: Internal Medicine

## 2022-09-16 DIAGNOSIS — R0609 Other forms of dyspnea: Secondary | ICD-10-CM

## 2022-09-16 DIAGNOSIS — R7989 Other specified abnormal findings of blood chemistry: Secondary | ICD-10-CM | POA: Insufficient documentation

## 2022-09-18 ENCOUNTER — Encounter: Payer: Self-pay | Admitting: Cardiovascular Disease

## 2022-10-10 ENCOUNTER — Ambulatory Visit
Admission: RE | Admit: 2022-10-10 | Discharge: 2022-10-10 | Disposition: A | Discharge status: Home / Self Care | Payer: No Typology Code available for payment source | Source: Ambulatory Visit | Attending: Internal Medicine | Admitting: Internal Medicine

## 2022-10-10 DIAGNOSIS — R0609 Other forms of dyspnea: Secondary | ICD-10-CM

## 2022-10-13 ENCOUNTER — Ambulatory Visit (HOSPITAL_COMMUNITY): Payer: PPO | Attending: Cardiology

## 2022-10-13 DIAGNOSIS — R7989 Other specified abnormal findings of blood chemistry: Secondary | ICD-10-CM | POA: Diagnosis not present

## 2022-10-13 DIAGNOSIS — R0609 Other forms of dyspnea: Secondary | ICD-10-CM

## 2022-10-14 ENCOUNTER — Other Ambulatory Visit: Payer: Self-pay | Admitting: Internal Medicine

## 2022-10-14 DIAGNOSIS — R7303 Prediabetes: Secondary | ICD-10-CM

## 2022-10-14 DIAGNOSIS — I5032 Chronic diastolic (congestive) heart failure: Secondary | ICD-10-CM

## 2022-10-14 LAB — ECHOCARDIOGRAM COMPLETE
AR max vel: 2.92 cm2
AV Area VTI: 2.75 cm2
AV Area mean vel: 2.73 cm2
AV Mean grad: 5 mmHg
AV Peak grad: 9.9 mmHg
Ao pk vel: 1.58 m/s
Area-P 1/2: 3.54 cm2
S' Lateral: 2.8 cm

## 2022-10-14 MED ORDER — EMPAGLIFLOZIN 10 MG PO TABS: 10.0000 mg | ORAL_TABLET | Freq: Every day | ORAL | 1 refills | Status: DC

## 2022-10-18 ENCOUNTER — Ambulatory Visit: Payer: PPO | Admitting: Neurology

## 2022-11-12 ENCOUNTER — Encounter: Payer: Self-pay | Admitting: Cardiovascular Disease

## 2022-11-14 ENCOUNTER — Encounter: Payer: Self-pay | Admitting: Cardiovascular Disease

## 2022-11-14 ENCOUNTER — Ambulatory Visit: Payer: PPO | Attending: Cardiovascular Disease | Admitting: Cardiovascular Disease

## 2022-11-14 DIAGNOSIS — G4752 REM sleep behavior disorder: Secondary | ICD-10-CM | POA: Diagnosis not present

## 2022-11-14 DIAGNOSIS — E785 Hyperlipidemia, unspecified: Secondary | ICD-10-CM | POA: Diagnosis not present

## 2022-11-14 DIAGNOSIS — I10 Essential (primary) hypertension: Secondary | ICD-10-CM

## 2022-11-14 DIAGNOSIS — I440 Atrioventricular block, first degree: Secondary | ICD-10-CM

## 2022-11-14 NOTE — Progress Notes (Unsigned)
Patient ID: Rayden Kohring, male   DOB: 05-02-1951, 72 y.o.   MRN: [id number]     Primary M.D.: Dr. Elgie Congo  HPI: Larone Mcgonagle is a 72 y.o. male presents to the office today for a 14 month followup cardiology evaluation.  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  Recently, another brother has a chronic aortic dissection for which she's been treated medically.  Mr Gayler has a history of hyperlipidemia and hypertension.  Over 10 years ago he had a normal routine treadmill test. The patient has continued to exercise regularly. There is a prior history of substance abuse for which he did undergo treatment.  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  He  has a history of hyperlipidemia, erectile dysfunction, a history of intermittent headaches.  When I saw him on 11/07/2012, we discussed the sensitivity and specificity of routine treadmill testing with a strong family history I elected to schedule him for a cardiopulmonary met test for further evaluation would also be helpful to assess endothelial dysfunction/diastolic dysfunction evaluate potential for microvascular angina. He also had a soft cardiac murmur at his apex and recommended a 2-D echo Doppler study.  His cardiopulmonary met test revealed that he had good functional capacity with an excellent maximum oxygen consumption peak of 92% of predicted. However, he had a abnormal response during the last several minutes of exercise suggesting ischemic myocardial dysfunction with decreasing stroke volume with increasing work rate which corresponded to an abrupt decrease in cardiac output suggestive of exercise-induced myocardial dysfunction. He did have very mild ventilation/perfusion mismatch  with reference to his pulmonary status.  A 2-D echo Doppler study showed normal systolic function with an ejection fraction of 55-60%. He had normal diastolic parameters.  When I saw him in followup of his noninvasive studies in June 2014 due to the mild abnormality noted during the last 7 minutes of exercise I recommended the addition of ARB therapy and further titrated his atorvastatin for aggressive lipid management. He has tolerated initiation of losartan.  I last saw him on most 2 years ago.  He denied any  chest pain or palpitations. He He did have a followup NMR lipoprotein of which showed an LDL particle number of 1216 with a calculated LDL of 86, HDL 46, triglycerides 58, total cholesterol 144. He had 552 small LDL some particles. HDL particle number was reduced at 29.1. Insulin resistance score was normal at 33.  He is working as a Teacher, adult education on [employer] patients and is not exercising as he had in the past.  However, he is still walking at least 3 days per week for a minimum of 30 minutes.  He underwent a sleep study at [facility] at [facility] on 05/03/201 and was not found to have significant obstructive sleep apnea and his AHI and RDI were both 2.9.  He has significant periodic limb movements of sleep and had snoring.  He tells me he had a mouth piece customized by Dr. Althea Grimmer for treatment of his snoring.   I saw him in January 2019.  He denies any episodes of chest pain or shortness of breath.  He is unaware of palpitations.  He uses his mouthguard to  sleep and denies any awareness of breakthrough snoring.  He continues to be on losartan 25 mg daily, and metoprolol succinate 25 mg daily for hypertension.  He has continued to be on atorvastatin 20 mg.  Of note, laboratory 6 months ago showed an LDL cholesterol, which had risen up to 90.  Diet evaluation I further titrated atorvastatin to 40 mg.  Over the last 6 months he has continued to remain stable.  He  underwent repeat laboratory on his increased atorvastatin which had not significantly changed.  Total cholesterol was 150, triglycerides 63, HDL 48, LDL 89.  He denies chest pain PND orthopnea.  He denies shortness of breath.  He has noticed some rare dizziness particularly when he bends over.    He was  evaluated in a telemedicine visit on October 11, 2018.  Prior to his evaluation in June 2019 he had self reduced his metoprolol succinate from 25 mg down to 12.5 mg due to noticing that his heart rate was morning somewhat slow.  He started to notice that particularly following exertion his blood pressure at times have gotten low and he has recorded blood pressure levels of 114/71 with a pulse of 67, 106/68 with a pulse of 65, 95/60 with a pulse of 63 and on 1 occasion systolic blood pressure had dropped to as low as 88.  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  He denied any episodes of chest pain.  He was taking losartan 25 mg daily in addition to Toprol-XL 12.5 mg daily and was on atorvastatin 40 mg and Zetia 10 mg for hyperlipidemia.  In January 2020 LDL cholesterol was 70.  He continues to take aspirin.  He admits to some fatigue associated with bradycardia.  He was using a customized oral appliance for his mild sleep apnea and followed by Dr. Althea Grimmer for his snoring/sleep apnea.  I saw him in June 2021 and since his previous evaluation he was having significant dreaming and falling out of bed diving due to potential REM related sleep disorder.  He ultimately was referred to Dr. Porfirio Mylar Dohmeier and apparently underwent an MRI  for assessment.  He is aware of the association of potential future Parkinson disease or Lewy body disorder associated with REM behavior parasomnias.  He does not know the results of his MRI imaging.   He denies chest pain or palpitations.  Sometimes if he stands abruptly he may note mild dizzy spells but this has improved with discontinuance of atenolol and switching to carvedilol.  I saw him on August 04, 2020 at which time he remained stable and was trying to slow down and has been working only approximately 7 days/month.    He has not had any further significant night terrors he is on clonazepam in addition to melatonin 6 mg at bedtime.  20 pounds over the past year.  He has continued to be on losartan 25 mg.  He is on atorvastatin 40 mg and Zetia 10 mg for hyperlipidemia.  He sees Dr. Sanda Linger who checks his laboratory.    I saw him on March 09, 2021.  At that time, with his history of REM sleep night terrors he is now involved in a Parkinson's disease study sponsored by the [employer].  He  is now fully retired.  He is using an oral appliance for sleep apnea followed by Dr. Myrtis Ser.  He denies chest pain or shortness of breath.  He is now on melatonin for his night terror and has a prescription for clonazepam but has never taken this.  He continues to be on atorvastatin 40 mg and Zetia 10 mg for hyperlipidemia.    I last saw him on September 19, 2021. e feels well.  Since his retirement at the end of August 2022 he has more time and exercises regularly and walking, swimming, water aerobics.  He denies chest pain or palpitations.  He continues to be on atorvastatin 40 mg  and Zetia 10 mg for hyperlipidemia, olmesartan 40 mg for hypertension, and Zyrtec on an as-needed basis.  His blood pressure has been stable.  He presents for evaluation.   Past Medical History:  Diagnosis Date   Allergy    First degree heart block    Hyperlipidemia    Hypertension    REM sleep behavior disorder    Substance abuse [facility])     Past Surgical History:  Procedure Laterality Date   INGUINAL HERNIA REPAIR Right 03/10/2015   Procedure: OPEN RIGHT INGUINAL HERNIA REPAIR;  Surgeon: Luretha Murphy, MD;   Location: [facility];  Service: General;  Laterality: Right;  With MESH   VASECTOMY      Allergies  Allergen Reactions   Nexium [Esomeprazole Magnesium]     Awful regurgitation    Current Outpatient Medications  Medication Sig Dispense Refill   alfuzosin (UROXATRAL) 10 MG 24 hr tablet Take 1 tablet (10 mg total) by mouth every morning. 90 tablet 1   aspirin EC 81 MG tablet Take 81 mg by mouth daily.     atorvastatin (LIPITOR) 40 MG tablet Take 1 tablet (40 mg total) by mouth daily. 90 tablet 1   Cholecalciferol (VITAMIN D) 2000 units CAPS Take 1 capsule by mouth daily.     clonazePAM (KLONOPIN) 0.5 MG tablet Take 0.5 tablets (0.25 mg total) by mouth at bedtime as needed for anxiety. 45 tablet 2   empagliflozin (JARDIANCE) 10 MG TABS tablet Take 1 tablet (10 mg total) by mouth daily before breakfast. 90 tablet 1   ezetimibe (ZETIA) 10 MG tablet TAKE ONE TABLET BY MOUTH ONCE DAILY 90 tablet 3   melatonin 3 MG TABS tablet Take 2 tablets (6 mg total) by mouth at bedtime. (Patient taking differently: Take 3 mg by mouth at bedtime.) 180 tablet 1   celecoxib (CELEBREX) 100 MG capsule Take 1 capsule (100 mg total) by mouth daily. (Patient not taking: Reported on 11/14/2022) 90 capsule 0   cetirizine (ZYRTEC) 10 MG tablet Take 10 mg by mouth as directed. PRN (Patient not taking: Reported on 11/14/2022)     No current facility-administered medications for this visit.    Socially he is married has 2 children no grandchildren. He does exercise at least 30 minutes at a time. There is a prior history of tobacco use but he quit over 3 years ago and a prior use of opiates. 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  ROS General: Negative; No fevers, chills, or night sweats;  HEENT: Negative; No changes in vision or hearing, sinus congestion, difficulty swallowing Pulmonary: Negative; No cough, wheezing, shortness of breath,  hemoptysis Cardiovascular: See history of present illness GI: Negative; No nausea, vomiting, diarrhea, or abdominal pain GU: Negative; No dysuria,  hematuria, or difficulty voiding Musculoskeletal: Negative; no myalgias, joint pain, or weakness Hematologic/Oncology: Negative; no easy bruising, bleeding Endocrine: Negative; no heat/cold intolerance; no diabetes Neuro: Negative; no changes in balance, headaches Skin: Negative; No rashes or skin lesions Psychiatric: Negative; No behavioral problems, depression Sleep: Sleep study negative for OSA but suspicious for increased upper airway resistance.  He did have periodic limb movements that were frequent.  No hypnogognic hallucinations, no cataplexy.  REM related sleep disorder. Other comprehensive 14 point system review is negative.  PE BP 110/70 (BP Location: Left Arm, Patient Position: Sitting, Cuff Size: Normal)   Pulse (!) 59   Ht 6' 2.5" (1.892 m)   Wt 196 lb 6.4 oz (89.1 kg)   SpO2 95%   BMI 24.88 kg/m    Repeat blood pressure by me was 114/66 supine and 110/64 standing  Wt Readings from Last 3 Encounters:  11/14/22 196 lb 6.4 oz (89.1 kg)  09/12/22 197 lb (89.4 kg)  08/24/22 200 lb 6.4 oz (90.9 kg)   General: Alert, oriented, no distress.  Skin: normal turgor, no rashes, warm and dry HEENT: Normocephalic, atraumatic. Pupils equal round and reactive to light; sclera anicteric; extraocular muscles intact; Fundi ** Nose without nasal septal hypertrophy Mouth/Parynx benign; Mallinpatti scale 3 Neck: No JVD, no carotid bruits; normal carotid upstroke Lungs: clear to ausculatation and percussion; no wheezing or rales Chest wall: without tenderness to palpitation Heart: PMI not displaced, RRR, s1 s2 normal, 1/6 systolic murmur, no diastolic murmur, no rubs, gallops, thrills, or heaves Abdomen: soft, nontender; no hepatosplenomehaly, BS+; abdominal aorta nontender and not dilated by palpation. Back: no CVA tenderness Pulses  2+ Musculoskeletal: full range of motion, normal strength, no joint deformities Extremities: no clubbing cyanosis or edema, Homan's sign negative  Neurologic: grossly nonfocal; Cranial nerves grossly wnl Psychologic: Normal mood and affect  ECG (independently read by me): Low atrial rhythm at 59, no ectopy  September 19, 2021 ECG (independently read by me): NSR at 64, 1st degree AV block; PR 240 msec, no ectopy  August 31,2022 ECG (independently read by me): Sinus rhythm at 64; 1st degree AV block    August 05, 2019 ECG (independently read by me): Sinus rhythm at 63, 1st degree AV bloick: PR 236  June 2021 ECG (independently read by me): Sinus rhythm at 64; 1st degree AV block, PR 232    msec; no ectopy  November 2020 ECG (independently read by me): Sinus bradycardia 55 bpm, first-degree AV block with a PR interval 238 ms.  QTc interval 380 ms.  No ectopy.  June 2019 ECG (independently read by me): Normal sinus rhythm at 62 bpm.  First-degree AV block with a PR interval at 240 ms.  No ectopy.  Normal intervals.   July 18, 2017 ECG (independently read by me): Normal sinus rhythm with an isolated PVC.  PR interval 202 ms.  Heart rate 63 bpm.  October 2017 ECG (independently read by me): Sinus bradycardia 57 bpm.  First-degree AV block with a PR interval at 228 ms.  No ST segment changes.  June 2016 ECG (independently read by me): Sinus rhythm with first-degree AV block.  PR interval 264 ms.  QTc interval normal 381 ms.  September 2014 ECG: Sinus bradycardia for the first degree AV block. PR interval  220 ms. QT interval 393 ms.  LABS:    Latest Ref Rng & Units 09/11/2022    8:06 AM 12/13/2021   11:50 AM 09/01/2021   11:40 AM  BMP  Glucose 70 - 99 mg/dL 96  95  161   BUN 6 - 23 mg/dL 20  20  17    Creatinine 0.40 - 1.50 mg/dL 0.96  0.45  4.09   Sodium 135 - 145 mEq/L 140  139  139   Potassium 3.5 - 5.1 mEq/L 4.1  4.5  4.9   Chloride 96 - 112 mEq/L 104  104  104   CO2 19 - 32 mEq/L 28   29  31    Calcium 8.4 - 10.5 mg/dL 9.7  9.5  9.5       Latest Ref Rng & Units 09/11/2022    8:06 AM 09/01/2021   11:40 AM 07/22/2019    8:18 AM  Hepatic Function  Total Protein 6.0 - 8.3 g/dL 6.6  6.5  7.0   Albumin 3.5 - 5.2 g/dL 4.0  4.3  4.3   AST 0 - 37 U/L 25  29  31    ALT 0 - 53 U/L 29  32  34   Alk Phosphatase 39 - 117 U/L 80  85  84   Total Bilirubin 0.2 - 1.2 mg/dL 0.5  0.8  0.7   Bilirubin, Direct 0.0 - 0.3 mg/dL 0.1  0.2  0.2       Latest Ref Rng & Units 09/11/2022    8:06 AM 12/13/2021   11:50 AM 09/01/2021   11:40 AM  CBC  WBC 4.0 - 10.5 K/uL 6.2  5.0  5.6   Hemoglobin 13.0 - 17.0 g/dL 81.1  91.4  78.2   Hematocrit 39.0 - 52.0 % 42.6  38.9  40.1   Platelets 150.0 - 400.0 K/uL 184.0  197.0  178.0    Lab Results  Component Value Date   MCV 91.1 09/11/2022   MCV 91.4 12/13/2021   MCV 90.5 09/01/2021   Lab Results  Component Value Date   TSH 2.57 09/11/2022   Lab Results  Component Value Date   HGBA1C 6.3 09/11/2022     Lipid Panel     Component Value Date/Time   CHOL 125 09/11/2022 0806   CHOL 150 10/24/2017 0809   CHOL 144 03/03/2013 0855   TRIG 49.0 09/11/2022 0806   TRIG 58 03/03/2013 0855   HDL 51.70 09/11/2022 0806   HDL 48 10/24/2017 0809   HDL 46 03/03/2013 0855   CHOLHDL 2 09/11/2022 0806   VLDL 9.8 09/11/2022 0806   LDLCALC 64 09/11/2022 0806   LDLCALC 89 10/24/2017 0809   LDLCALC 86 03/03/2013 0855     RADIOLOGY: No results found.  IMPRESSION:  No diagnosis found.   ASSESSMENT AND PLAN: Mr. 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  He has had first-degree block.  In the past, he has had some episodes of mild dizziness particularly if he bends over which has resulted in reduction in his beta-blocker  regimen.  Remotely, his atenolol dose was switched to carvedilol low-dose which has been beneficial.  On subsequent evaluations, he continued to experience the dizziness and it was recommended that he ultimately wean and discontinue carvedilol.  Since I last saw him, he was started on olmesartan 40 mg in place of his previous losartan.  He continues to be on atorvastatin 40 mg and Zetia 10 mg for hyperlipidemia.  Most  recent LDL cholesterol in February 23 was 66.  He takes melatonin and is involved in a future potential Parkinson disease study in light of his rem sleep night terrors.  He is exercising regularly.  His blood pressure today is stable and repeat by me was 114/66 supine and 110/64 standing.  He will continue his current medical regimen.  I will see him in 1 year for reevaluation or sooner as needed.    Lennette Bihari, MD, [facility]  11/14/2022 2:28 PM

## 2022-11-14 NOTE — Patient Instructions (Signed)
Medication Instructions:  The current medical regimen is effective;  continue present plan and medications.  *If you need a refill on your cardiac medications before your next appointment, please call your pharmacy*   Lab Work: [facility] today   If you have labs (blood work) drawn today and your tests are completely normal, you will receive your results only by: MyChart Message (if you have MyChart) OR A paper copy in the mail If you have any lab test that is abnormal or we need to change your treatment, we will call you to review the results.   Follow-Up: At [facility], you and your health needs are our priority.  As part of our continuing mission to provide you with exceptional heart care, we have created designated Provider Care Teams.  These Care Teams include your primary Cardiologist (physician) and Advanced Practice Providers (APPs -  Physician Assistants and Nurse Practitioners) who all work together to provide you with the care you need, when you need it.  We recommend signing up for the patient portal called "MyChart".  Sign up information is provided on this After Visit Summary.  MyChart is used to connect with patients for Virtual Visits (Telemedicine).  Patients are able to view lab/test results, encounter notes, upcoming appointments, etc.  Non-urgent messages can be sent to your provider as well.   To learn more about what you can do with MyChart, go to [email or web address].    Your next appointment:   6 month(s)  Provider:   Nicki Guadalajara, MD

## 2022-11-16 ENCOUNTER — Encounter: Payer: Self-pay | Admitting: Cardiovascular Disease

## 2022-11-16 LAB — LIPOPROTEIN A (LPA): Lipoprotein (a): 167.7 nmol/L — ABNORMAL HIGH (ref <75.0)

## 2022-11-27 ENCOUNTER — Encounter: Payer: Self-pay | Admitting: Cardiovascular Disease

## 2022-12-18 ENCOUNTER — Encounter: Payer: Self-pay | Admitting: Cardiovascular Disease

## 2023-01-03 ENCOUNTER — Other Ambulatory Visit: Payer: Self-pay | Admitting: Internal Medicine

## 2023-01-03 DIAGNOSIS — E7841 Elevated Lipoprotein(a): Secondary | ICD-10-CM

## 2023-01-03 DIAGNOSIS — E785 Hyperlipidemia, unspecified: Secondary | ICD-10-CM

## 2023-01-03 MED ORDER — ROSUVASTATIN CALCIUM 20 MG PO TABS
20.0000 mg | ORAL_TABLET | Freq: Every day | ORAL | 0 refills | Status: AC
Start: 2023-01-03 — End: ?

## 2023-01-03 NOTE — Telephone Encounter (Signed)
Spoke with pt, pt is asking for Clarence Ware to approve the Crestor medication for him. He is not wanting to continue nor increase on current cholesterol medication due to past experiences of the medication not lowering cholesterol. Patient will be leaving out of town soon. Asked pt to give pharmacy info for the state he will be traveling to just in case he's out of town. Pt states he will call his PCP to get the medication prescribed if Dr. Tresa Ware does not approve change in therapy. Pt will leave out of state pharmacy info in [place] message.

## 2023-02-02 ENCOUNTER — Other Ambulatory Visit: Payer: Self-pay | Admitting: Internal Medicine

## 2023-02-02 DIAGNOSIS — R7303 Prediabetes: Secondary | ICD-10-CM

## 2023-02-02 DIAGNOSIS — J208 Acute bronchitis due to other specified organisms: Secondary | ICD-10-CM | POA: Insufficient documentation

## 2023-02-02 DIAGNOSIS — I5032 Chronic diastolic (congestive) heart failure: Secondary | ICD-10-CM

## 2023-02-02 MED ORDER — NIRMATRELVIR/RITONAVIR (PAXLOVID)TABLET
3.0000 | ORAL_TABLET | Freq: Two times a day (BID) | ORAL | 0 refills | Status: AC
Start: 2023-02-02 — End: 2023-02-07

## 2023-03-21 ENCOUNTER — Encounter: Payer: Self-pay | Admitting: Neurology

## 2023-03-21 ENCOUNTER — Ambulatory Visit: Payer: PPO | Admitting: Neurology

## 2023-03-21 VITALS — BP 120/73 | HR 60 | Ht 74.0 in | Wt 194.4 lb

## 2023-03-21 DIAGNOSIS — R4181 Age-related cognitive decline: Secondary | ICD-10-CM | POA: Diagnosis not present

## 2023-03-21 DIAGNOSIS — G20A1 Parkinson's disease without dyskinesia, without mention of fluctuations: Secondary | ICD-10-CM | POA: Diagnosis not present

## 2023-03-21 DIAGNOSIS — G4752 REM sleep behavior disorder: Secondary | ICD-10-CM

## 2023-03-21 DIAGNOSIS — G3281 Cerebellar ataxia in diseases classified elsewhere: Secondary | ICD-10-CM

## 2023-03-21 MED ORDER — CLONAZEPAM 0.5 MG PO TABS: 0.2500 mg | ORAL_TABLET | Freq: Every evening | ORAL | 2 refills | Status: DC | PRN

## 2023-03-21 NOTE — Progress Notes (Signed)
Provider:  Melvyn Novas, MD  Primary Care Physician:  Etta Grandchild, MD [address] [place] [address]     Referring Provider: Etta Grandchild, Md [address] [place],  [place] [address]          Chief Complaint according to patient   Patient presents with:     Memory evaluation,            HISTORY OF PRESENT ILLNESS:  Clarence Ware is a 72 y.o. male patient who is here for revisit 03/21/2023 for Memory loss,.  Chief concern according to patient :  routine follow up. Has been diagnosed with PD and MCI, not tested for AD Biomarkers here .  He was enrolled into a [employer] study ( or lilly)  for dementia testing and treatment and  was not getting into the study as Biomarkers were negative. CHF with reserved EF-Echo normal for age . DOE, Hyperlipidemia, non diabetic on Jardiance. He is followed at [facility] in [place]. CSF . Blood, Cognitive testing there yearly. REM BD - that's why we originally met in sleep clinic.   He reports a decline in dexterity, holding on to a dart or horseshoe when wanting to throw it, running into corners on his left .  Drifting when walking sometimes to the left -3 episodes of vivid dreams in the last year he remembers.            Clarence Ware is a 72 y.o. male , 03-20-2022, he never took klonopin, here to see if he has any associated symptoms of cognitive decline , of Parkinsonism. Has been taking melatonin.  Has some morning a headache, congested nasal passage. Reports occasional name recall delay, but it " floats up". He is in a [employer] study. He had a first visit 04-2021 at [facility] [place], DAT scan was done there. 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 Movement disorder MD assessed walking speed.  Tomorrow he has a phone call interview and follow up.           Last seen on 03-16-2021, I have the  pleasure of seeing [name] and this is the first time in a while.  He states that he had no need to use Klonopin over the last 12 months has not Ware out of bed and since he sleeps in a separate bedroom from his wife he is not sure that he has any enactment at all.  He feels that he is well controlled on melatonin only at this time.  As we know he had been SNRI for a while but we never found a significant amount of apnea warranting any intervention.  He has been doing okay with a mouthguard which controlled his snoring but unfortunately has led to a shift in his teeth.  His fatigue severity scale today was endorsed at only 14 points and his Epworth sleepiness score at 3 points.  The patient has been diagnosed with REM behavior disorder and he is now enrolled in the [employer] study.  He enrolled online, he performed a sniff test in which he must have not done well, and is now scheduled- in [place] -to undergo a DAT scan.     Last seen in a Rv after sleep study on 03-16-2020.  He underwent a study on 25 January 2020 which  showed no evidence of obstructive sleep apnea and there was no clinically relevant periodic limb movement especially not in REM sleep.  Normal EKG no drop in oxygen and treatment suggestions were made to stop with melatonin or an SSRI and to hold off on Klonopin as it is a potentially habit-forming medication.  However when he travels or may have for some reason unusual circumstances more stress sleep deprivation etc. it may well be worth having Klonopin as a rescue medication for that night.  The couple now sleeps in different rooms because of the REM enactment. He is still shouting.    He takes 3 mg melatonin, which may have reduced the physical activity.  He can use klonopin for rescue in certain situations, in light of his substance abuse history.     Sister with significant STM loss. 71 years old.     Review of Systems: Out of a complete 14 system  review, the patient complains of only the following symptoms, and all other reviewed systems are negative.:  REM BD   He reports a decline in dexterity, holding on to a dart or horseshoe when wanting to throw it, running into corners on his left .  Drifting when walking sometimes to the left -3 episodes of vivid dreams in the last year he remembers.      How likely are you to doze in the following situations: 0 = not likely, 1 = slight chance, 2 = moderate chance, 3 = high chance   Sitting and Reading? Watching Television? Sitting inactive in a public place (theater or meeting)? As a passenger in a car for an hour without a break? Lying down in the afternoon when circumstances permit? Sitting and talking to someone? Sitting quietly after lunch without alcohol? In a car, while stopped for a few minutes in traffic?   Total = not obtained / 24 points   FSS endorsed at not obtained / 63 points.   Social History   Socioeconomic History   Marital status: Married    Spouse name: Clarence Ware   Number of children: 2   Years of education: Not on file   Highest education level: Not on file  Occupational History   Occupation: Pharmacist part-time  Tobacco Use   Smoking status: Former    Current packs/day: 0.00    Average packs/day: 0.3 packs/day for 1 year (0.3 ttl pk-yrs)    Types: Cigarettes    Start date: 07/11/1991    Quit date: 07/10/1992    Years since quitting: 30.7   Smokeless tobacco: Never  Vaping Use   Vaping status: Never Used  Substance and Sexual Activity   Alcohol use: No    Alcohol/week: 0.0 standard drinks of alcohol   Drug use: Not Currently    Comment: sobrity for 10 years   Sexual activity: Yes  Other Topics Concern   Not on file  Social History Narrative   Married. Education: [employer].   Exercise: some   Caffeine- coffee 2 c daily   Social Determinants of Health   Financial Resource Strain: Low Risk  (06/09/2022)   Overall Financial Resource Strain (CARDIA)     Difficulty of Paying Living Expenses: Not hard at all  Food Insecurity: No Food Insecurity (06/09/2022)   Hunger Vital Sign    Worried About Running Out of Food in the Last Year: Never true    Ran Out of Food in the Last Year: Never true  Transportation Needs: No Transportation Needs (06/09/2022)   PRAPARE -  Administrator, Civil Service (Medical): No    Lack of Transportation (Non-Medical): No  Physical Activity: Sufficiently Active (06/09/2022)   Exercise Vital Sign    Days of Exercise per Week: 6 days    Minutes of Exercise per Session: 60 min  Stress: No Stress Concern Present (06/09/2022)   [employer] of Occupational Health - Occupational Stress Questionnaire    Feeling of Stress : Not at all  Social Connections: Moderately Integrated (06/09/2022)   Social Connection and Isolation Panel [NHANES]    Frequency of Communication with Friends and Family: More than three times a week    Frequency of Social Gatherings with Friends and Family: More than three times a week    Attends Religious Services: Never    Database administrator or Organizations: Yes    Attends Engineer, structural: More than 4 times per year    Marital Status: Married    Family History  Problem Relation Age of Onset   Heart disease Mother    Stroke Mother    Heart disease Father 96       MI   Hypertension Father    Hypertension Brother    Cancer Brother    Diabetes Brother    Heart disease Brother    Hyperlipidemia Brother    Cancer Brother    Hyperlipidemia Sister    Coronary artery disease Brother 64       CABG   Colon cancer Paternal Grandmother     Past Medical History:  Diagnosis Date   Allergy    First degree heart block    Hyperlipidemia    Hypertension    REM sleep behavior disorder    Substance abuse (HCC)     Past Surgical History:  Procedure Laterality Date   INGUINAL HERNIA REPAIR Right 03/10/2015   Procedure: OPEN RIGHT INGUINAL HERNIA REPAIR;  Surgeon:  Luretha Murphy, MD;  Location: [facility];  Service: General;  Laterality: Right;  With MESH   VASECTOMY       Current Outpatient Medications on File Prior to Visit  Medication Sig Dispense Refill   alfuzosin (UROXATRAL) 10 MG 24 hr tablet Take 1 tablet (10 mg total) by mouth every morning. 90 tablet 1   aspirin EC 81 MG tablet Take 81 mg by mouth daily.     celecoxib (CELEBREX) 100 MG capsule Take 1 capsule (100 mg total) by mouth daily. 90 capsule 0   cetirizine (ZYRTEC) 10 MG tablet Take 10 mg by mouth as directed. PRN     Cholecalciferol (VITAMIN D) 2000 units CAPS Take 1 capsule by mouth daily.     clonazePAM (KLONOPIN) 0.5 MG tablet Take 0.5 tablets (0.25 mg total) by mouth at bedtime as needed for anxiety. 45 tablet 2   ezetimibe (ZETIA) 10 MG tablet TAKE ONE TABLET BY MOUTH ONCE DAILY 90 tablet 3   JARDIANCE 10 MG TABS tablet Take 1 tablet by mouth once daily before breakfast. 90 tablet 1   melatonin 3 MG TABS tablet Take 2 tablets (6 mg total) by mouth at bedtime. (Patient taking differently: Take 3 mg by mouth at bedtime.) 180 tablet 1   rosuvastatin (CRESTOR) 20 MG tablet Take 1 tablet (20 mg total) by mouth daily. 90 tablet 0   No current facility-administered medications on file prior to visit.    Allergies  Allergen Reactions   Nexium [Esomeprazole Magnesium]     Awful regurgitation     DIAGNOSTIC DATA (LABS, IMAGING, TESTING) -  I reviewed patient records, labs, notes, testing and imaging myself where available.  Lab Results  Component Value Date   WBC 6.2 09/11/2022   HGB 14.4 09/11/2022   HCT 42.6 09/11/2022   MCV 91.1 09/11/2022   PLT 184.0 09/11/2022      Component Value Date/Time   NA 140 09/11/2022 0806   NA 141 10/24/2017 0809   K 4.1 09/11/2022 0806   CL 104 09/11/2022 0806   CO2 28 09/11/2022 0806   GLUCOSE 96 09/11/2022 0806   BUN 20 09/11/2022 0806   BUN 19 10/24/2017 0809   CREATININE 0.93 09/11/2022 0806   CREATININE 0.91 03/02/2020 0856    CALCIUM 9.7 09/11/2022 0806   PROT 6.6 09/11/2022 0806   PROT 6.8 10/24/2017 0809   ALBUMIN 4.0 09/11/2022 0806   ALBUMIN 4.5 10/24/2017 0809   AST 25 09/11/2022 0806   ALT 29 09/11/2022 0806   ALKPHOS 80 09/11/2022 0806   BILITOT 0.5 09/11/2022 0806   BILITOT 0.5 10/24/2017 0809   GFRNONAA 86 03/02/2020 0856   GFRAA 99 03/02/2020 0856   Lab Results  Component Value Date   CHOL 125 09/11/2022   HDL 51.70 09/11/2022   LDLCALC 64 09/11/2022   TRIG 49.0 09/11/2022   CHOLHDL 2 09/11/2022   Lab Results  Component Value Date   HGBA1C 6.3 09/11/2022   Lab Results  Component Value Date   VITAMINB12 683 08/05/2020   Lab Results  Component Value Date   TSH 2.57 09/11/2022    PHYSICAL EXAM:  Today's Vitals   03/21/23 0920  BP: 120/73  Pulse: 60  Weight: 194 lb 6.4 oz (88.2 kg)  Height: 6\' 2"  (1.88 m)   Body mass index is 24.96 kg/m.   Wt Readings from Last 3 Encounters:  03/21/23 194 lb 6.4 oz (88.2 kg)  11/14/22 196 lb 6.4 oz (89.1 kg)  09/12/22 197 lb (89.4 kg)     Ht Readings from Last 3 Encounters:  03/21/23 6\' 2"  (1.88 m)  11/14/22 6' 2.5" (1.892 m)  09/12/22 6\' 2"  (1.88 m)      General:  The patient is awake, alert and appears not in acute distress. The patient is well groomed. Head: Normocephalic, atraumatic. Neck is supple.  Mallampati 3,  neck circumference:16 inches . Nasal airflow  patent.  Retrognathia is not seen. Facial hair.  Dental status: biological  Cardiovascular:  Regular rate and cardiac rhythm by pulse,  without distended neck veins. Respiratory: Lungs are clear to auscultation.  Skin:  Without evidence of ankle edema, or rash. Trunk: The patient's posture is erect.   NEUROLOGIC EXAM: The patient is awake and alert, oriented to place and time.   Memory subjective described as intact.      03/21/2023    9:27 AM 03/20/2022    8:26 AM  Montreal Cognitive Assessment   Visuospatial/ Executive (0/5) 4 5  Naming (0/3) 3 3  Attention:  Read list of digits (0/2) 2 2  Attention: Read list of letters (0/1) 1 1  Attention: Serial 7 subtraction starting at 100 (0/3) 3 3  Language: Repeat phrase (0/2) 2 2  Language : Fluency (0/1) 1 1  Abstraction (0/2) 2 2  Delayed Recall (0/5) 5 3  Orientation (0/6) 6 6  Total 29 28    Attention span & concentration ability appears normal.  Speech is fluent,  without  dysarthria, dysphonia or aphasia.  Mood and affect are appropriate.   Cranial nerves: no loss of smell or taste  reported  Pupils are equal and briskly reactive to light.  Extraocular movements in vertical and horizontal planes were intact and without nystagmus. No Diplopia. Visual fields by finger perimetry are intact. Hearing was intact to soft voice and finger rubbing.    Facial sensation intact to fine touch. Facial motor strength is symmetric and tongue and uvula move midline.  Neck ROM : rotation, tilt and flexion extension were normal for age and shoulder shrug was symmetrical.    Motor exam:  Symmetric bulk, tone and ROM.   Normal tone without cog- wheeling, symmetric grip strength - strong.   Sensory:  Fine touch, pinprick and vibration were normal.  Proprioception tested in the upper extremities was normal.   Coordination: Rapid alternating movements in the fingers/hands were of normal speed.    The Finger-to-nose maneuver was intact without evidence of ataxia, dysmetria or tremor.   Gait and station: Patient could rise unassisted from a seated position,  walked without assistive device.  Stance is of normal width/ base and the patient turned with 3-4  steps.  Toe and heel walk were deferred.  Deep tendon reflexes: in the  upper and lower extremities are symmetric and intact.  Almost brisk.  Babinski response was deferred .    ASSESSMENT AND PLAN 72 y.o. year old male here with:  0) NO Memory impairment by [facility]- here for yearly testing.     1) PD with REM BD - followed by Beatriz Chancellor .  I will refill Klonopin for the night when travelling.  Has tried it daily for while and felt it did not prevent night time yelling.   2) Snoring - not OSA- dental mouth guard for snoring , uses it PRN.      I plan to follow up either personally or through our NP within 12-18 months, for [facility] and for klonopin refill, gait evaluation and sleepiness score.   I would like to thank Etta Grandchild, Md [address] [place],  [place] [address] for allowing me to meet with and to take care of this pleasant patient.   After spending a total time of  23  minutes face to face and additional time for physical and neurologic examination, review of laboratory studies,  personal review of imaging studies, reports and results of other testing and review of referral information / records as far as provided in visit,   Electronically signed by: Melvyn Novas, MD 03/21/2023 9:43 AM  [facility] Board certified by The [employer] of Sleep Medicine and Diplomate of the [employer] of Sleep Medicine. Board certified In Neurology through the ABPN, Fellow of the [employer] of Neurology.

## 2023-04-01 ENCOUNTER — Encounter: Payer: Self-pay | Admitting: Internal Medicine

## 2023-04-04 ENCOUNTER — Ambulatory Visit: Payer: PPO | Admitting: Internal Medicine

## 2023-04-04 ENCOUNTER — Encounter: Payer: Self-pay | Admitting: Internal Medicine

## 2023-04-04 VITALS — BP 128/70 | HR 55 | Temp 97.9°F | Ht 74.0 in | Wt 191.0 lb

## 2023-04-04 DIAGNOSIS — E785 Hyperlipidemia, unspecified: Secondary | ICD-10-CM | POA: Diagnosis not present

## 2023-04-04 DIAGNOSIS — R351 Nocturia: Secondary | ICD-10-CM | POA: Diagnosis not present

## 2023-04-04 DIAGNOSIS — B079 Viral wart, unspecified: Secondary | ICD-10-CM | POA: Diagnosis not present

## 2023-04-04 DIAGNOSIS — N401 Enlarged prostate with lower urinary tract symptoms: Secondary | ICD-10-CM | POA: Diagnosis not present

## 2023-04-04 DIAGNOSIS — R7303 Prediabetes: Secondary | ICD-10-CM

## 2023-04-04 DIAGNOSIS — K219 Gastro-esophageal reflux disease without esophagitis: Secondary | ICD-10-CM

## 2023-04-04 DIAGNOSIS — I10 Essential (primary) hypertension: Secondary | ICD-10-CM | POA: Diagnosis not present

## 2023-04-04 LAB — CBC WITH DIFFERENTIAL/PLATELET
Basophils Absolute: 0 10*3/uL (ref 0.0–0.1)
Basophils Relative: 0.7 % (ref 0.0–3.0)
Eosinophils Absolute: 0.5 10*3/uL (ref 0.0–0.7)
Eosinophils Relative: 9 % — ABNORMAL HIGH (ref 0.0–5.0)
HCT: 44.3 % (ref 39.0–52.0)
Hemoglobin: 14.7 g/dL (ref 13.0–17.0)
Lymphocytes Relative: 22.7 % (ref 12.0–46.0)
Lymphs Abs: 1.3 10*3/uL (ref 0.7–4.0)
MCHC: 33.3 g/dL (ref 30.0–36.0)
MCV: 91.2 fl (ref 78.0–100.0)
Monocytes Absolute: 0.7 10*3/uL (ref 0.1–1.0)
Monocytes Relative: 11.4 % (ref 3.0–12.0)
Neutro Abs: 3.3 10*3/uL (ref 1.4–7.7)
Neutrophils Relative %: 56.2 % (ref 43.0–77.0)
Platelets: 187 10*3/uL (ref 150.0–400.0)
RBC: 4.86 Mil/uL (ref 4.22–5.81)
RDW: 13.4 % (ref 11.5–15.5)
WBC: 5.8 10*3/uL (ref 4.0–10.5)

## 2023-04-04 LAB — BASIC METABOLIC PANEL
BUN: 21 mg/dL (ref 6–23)
CO2: 29 mEq/L (ref 19–32)
Calcium: 9.8 mg/dL (ref 8.4–10.5)
Chloride: 103 mEq/L (ref 96–112)
Creatinine, Ser: 1.02 mg/dL (ref 0.40–1.50)
GFR: 73.3 mL/min (ref >60.00)
Glucose, Bld: 104 mg/dL — ABNORMAL HIGH (ref 70–99)
Potassium: 4.8 mEq/L (ref 3.5–5.1)
Sodium: 139 mEq/L (ref 135–145)

## 2023-04-04 LAB — CK: Total CK: 62 U/L (ref 7–232)

## 2023-04-04 LAB — HEPATIC FUNCTION PANEL
ALT: 22 U/L (ref 0–53)
AST: 22 U/L (ref 0–37)
Albumin: 4.4 g/dL (ref 3.5–5.2)
Alkaline Phosphatase: 74 U/L (ref 39–117)
Bilirubin, Direct: 0.1 mg/dL (ref 0.0–0.3)
Total Bilirubin: 0.7 mg/dL (ref 0.2–1.2)
Total Protein: 6.7 g/dL (ref 6.0–8.3)

## 2023-04-04 LAB — LIPID PANEL
Cholesterol: 132 mg/dL (ref 0–200)
HDL: 56.4 mg/dL (ref >39.00)
LDL Cholesterol: 64 mg/dL (ref 0–99)
NonHDL: 75.66
Total CHOL/HDL Ratio: 2
Triglycerides: 59 mg/dL (ref 0.0–149.0)
VLDL: 11.8 mg/dL (ref 0.0–40.0)

## 2023-04-04 LAB — PSA: PSA: 2.79 ng/mL (ref 0.10–4.00)

## 2023-04-04 LAB — HEMOGLOBIN A1C: Hgb A1c MFr Bld: 6.2 % (ref 4.6–6.5)

## 2023-04-04 NOTE — Progress Notes (Deleted)
Subjective:  Patient ID: Clarence Ware, male    DOB: 1950-12-18  Age: 72 y.o. MRN: [id number]  CC: No chief complaint on file.   HPI Clarence Ware presents for ***  History Clarence Ware has a past medical history of Allergy, First degree heart block, Hyperlipidemia, Hypertension, REM sleep behavior disorder, and Substance abuse (HCC).   He has a past surgical history that includes Vasectomy and Inguinal hernia repair (Right, 03/10/2015).   His family history includes Cancer in his brother and brother; Colon cancer in his paternal grandmother; Coronary artery disease (age of onset: 38) in his brother; Diabetes in his brother; Heart disease in his brother and mother; Heart disease (age of onset: 85) in his father; Hyperlipidemia in his brother and sister; Hypertension in his brother and father; Stroke in his mother.He reports that he quit smoking about 30 years ago. His smoking use included cigarettes. He started smoking about 31 years ago. He has a 0.3 pack-year smoking history. He has never used smokeless tobacco. He reports that he does not currently use drugs. He reports that he does not drink alcohol.  Outpatient Medications Prior to Visit  Medication Sig Dispense Refill   alfuzosin (UROXATRAL) 10 MG 24 hr tablet Take 1 tablet (10 mg total) by mouth every morning. 90 tablet 1   aspirin EC 81 MG tablet Take 81 mg by mouth daily.     celecoxib (CELEBREX) 100 MG capsule Take 1 capsule (100 mg total) by mouth daily. 90 capsule 0   cetirizine (ZYRTEC) 10 MG tablet Take 10 mg by mouth as directed. PRN     Cholecalciferol (VITAMIN D) 2000 units CAPS Take 1 capsule by mouth daily.     clonazePAM (KLONOPIN) 0.5 MG tablet Take 0.5 tablets (0.25 mg total) by mouth at bedtime as needed for anxiety. 45 tablet 2   ezetimibe (ZETIA) 10 MG tablet TAKE ONE TABLET BY MOUTH ONCE DAILY 90 tablet 3   JARDIANCE 10 MG TABS tablet Take 1 tablet by mouth once daily before breakfast. 90 tablet 1   melatonin 3  MG TABS tablet Take 2 tablets (6 mg total) by mouth at bedtime. (Patient taking differently: Take 3 mg by mouth at bedtime.) 180 tablet 1   rosuvastatin (CRESTOR) 20 MG tablet Take 1 tablet (20 mg total) by mouth daily. 90 tablet 0   No facility-administered medications prior to visit.    ROS Review of Systems  Objective:  There were no vitals taken for this visit.  Physical Exam    Assessment & Plan:  There are no diagnoses linked to this encounter.    Follow-up: No follow-ups on file.  Sanda Linger, MD

## 2023-04-04 NOTE — Patient Instructions (Signed)

## 2023-04-04 NOTE — Progress Notes (Signed)
Subjective:  Patient ID: Clarence Ware, male    DOB: 04-22-1951  Age: 72 y.o. MRN: [id number]  CC: Gastroesophageal Reflux and Hypertension   HPI Tehron Laver presents for f/up ----  Discussed the use of AI scribe software for clinical note transcription with the patient, who gave verbal consent to proceed.  History of Present Illness   The patient, with a history of hypertension, presents with intermittent episodes of dizziness and lightheadedness. These episodes are not associated with exercise, as they can swim for 30 to 45 minutes or walk without experiencing these symptoms. However, they do note occasional lightheadedness when climbing stairs. They are not currently on any blood pressure medications, but are taking Jardiance.  In addition to the dizziness, the patient reports urinary frequency, particularly at night, with two to three nocturnal episodes. They deny any discomfort or pain during urination. The patient is due for a urology appointment and a PSA test, which was last done a year ago. They are currently on Uroxatral, presumably for management of these urinary symptoms.  The patient also mentions a wart that has been present for a while, but does not elaborate on its location or any associated symptoms.       Outpatient Medications Prior to Visit  Medication Sig Dispense Refill   aspirin EC 81 MG tablet Take 81 mg by mouth daily.     celecoxib (CELEBREX) 100 MG capsule Take 1 capsule (100 mg total) by mouth daily. 90 capsule 0   cetirizine (ZYRTEC) 10 MG tablet Take 10 mg by mouth as directed. PRN     Cholecalciferol (VITAMIN D) 2000 units CAPS Take 1 capsule by mouth daily.     clonazePAM (KLONOPIN) 0.5 MG tablet Take 0.5 tablets (0.25 mg total) by mouth at bedtime as needed for anxiety. 45 tablet 2   JARDIANCE 10 MG TABS tablet Take 1 tablet by mouth once daily before breakfast. 90 tablet 1   melatonin 3 MG TABS tablet Take 2 tablets (6 mg total) by mouth at  bedtime. (Patient taking differently: Take 3 mg by mouth at bedtime.) 180 tablet 1   alfuzosin (UROXATRAL) 10 MG 24 hr tablet Take 1 tablet (10 mg total) by mouth every morning. 90 tablet 1   ezetimibe (ZETIA) 10 MG tablet TAKE ONE TABLET BY MOUTH ONCE DAILY 90 tablet 3   rosuvastatin (CRESTOR) 20 MG tablet Take 1 tablet (20 mg total) by mouth daily. 90 tablet 0   No facility-administered medications prior to visit.    ROS Review of Systems  Constitutional:  Negative for appetite change, chills, diaphoresis, fatigue and fever.  HENT: Negative.    Eyes: Negative.   Respiratory:  Negative for cough, chest tightness, shortness of breath and wheezing.   Cardiovascular:  Negative for chest pain, palpitations and leg swelling.  Gastrointestinal:  Positive for constipation. Negative for abdominal pain, nausea and vomiting.  Genitourinary: Negative.  Negative for difficulty urinating and dysuria.  Musculoskeletal:  Positive for arthralgias. Negative for joint swelling and myalgias.  Skin: Negative.   Neurological: Negative.   Hematological:  Negative for adenopathy. Does not bruise/bleed easily.  Psychiatric/Behavioral: Negative.      Objective:  BP 128/70 (BP Location: Left Arm, Patient Position: Sitting, Cuff Size: Large)   Pulse (!) 55   Temp 97.9 F (36.6 C) (Oral)   Ht 6\' 2"  (1.88 m)   Wt 191 lb (86.6 kg)   SpO2 95%   BMI 24.52 kg/m   BP Readings from Last 3 Encounters:  04/04/23 128/70  03/21/23 120/73  11/14/22 110/70    Wt Readings from Last 3 Encounters:  04/04/23 191 lb (86.6 kg)  03/21/23 194 lb 6.4 oz (88.2 kg)  11/14/22 196 lb 6.4 oz (89.1 kg)    Physical Exam Vitals reviewed.  Constitutional:      Appearance: Normal appearance.  HENT:     Mouth/Throat:     Mouth: Mucous membranes are moist.  Eyes:     General: No scleral icterus.    Conjunctiva/sclera: Conjunctivae normal.  Cardiovascular:     Rate and Rhythm: Normal rate and regular rhythm.     Heart  sounds: No murmur heard. Pulmonary:     Effort: Pulmonary effort is normal.     Breath sounds: No stridor. No wheezing, rhonchi or rales.  Abdominal:     General: Abdomen is flat.     Palpations: There is no mass.     Tenderness: There is no abdominal tenderness. There is no guarding.     Hernia: No hernia is present.  Musculoskeletal:        General: Normal range of motion.     Cervical back: Neck supple.     Right lower leg: No edema.     Left lower leg: No edema.  Lymphadenopathy:     Cervical: No cervical adenopathy.  Skin:    General: Skin is warm and dry.     Findings: Lesion present. No rash.  Neurological:     General: No focal deficit present.     Mental Status: He is alert. Mental status is at baseline.  Psychiatric:        Mood and Affect: Mood normal.        Behavior: Behavior normal.     Lab Results  Component Value Date   WBC 5.8 04/04/2023   HGB 14.7 04/04/2023   HCT 44.3 04/04/2023   PLT 187.0 04/04/2023   GLUCOSE 104 (H) 04/04/2023   CHOL 132 04/04/2023   TRIG 59.0 04/04/2023   HDL 56.40 04/04/2023   LDLCALC 64 04/04/2023   ALT 22 04/04/2023   AST 22 04/04/2023   NA 139 04/04/2023   K 4.8 04/04/2023   CL 103 04/04/2023   CREATININE 1.02 04/04/2023   BUN 21 04/04/2023   CO2 29 04/04/2023   TSH 2.57 09/11/2022   PSA 2.79 04/04/2023   HGBA1C 6.2 04/04/2023    CT CARDIAC SCORING ([facility] LOCATIONS ONLY)  Result Date: 10/10/2022 CLINICAL DATA:  72 year old Caucasian male with history of high cholesterol, hypertension and former history of smoking. * Tracking Code: FCC * EXAM: CT CARDIAC CORONARY ARTERY CALCIUM SCORE TECHNIQUE: Non-contrast imaging through the heart was performed using prospective ECG gating. Image post processing was performed on an independent workstation, allowing for quantitative analysis of the heart and coronary arteries. Note that this exam targets the heart and the chest was not imaged in its entirety. COMPARISON:  No priors.  FINDINGS: CORONARY CALCIUM SCORES: Left Main: 35 LAD: 175 LCx: 0 RCA: 2 Total Agatston Score: 213 MESA database percentile: 53rd AORTA MEASUREMENTS: Ascending Aorta: 3.7 cm Descending Aorta:3.0 cm OTHER FINDINGS: Calcifications of the aortic valve. Within the visualized portions of the thorax there are no suspicious appearing pulmonary nodules or masses, there is no acute consolidative airspace disease, no pleural effusions, no pneumothorax and no lymphadenopathy. Visualized portions of the upper abdomen are unremarkable. There are no aggressive appearing lytic or blastic lesions noted in the visualized portions of the skeleton. IMPRESSION: 1. Patient's total coronary artery  calcium score is 213 which is 53rd percentile for patient's of matched age, gender and race/ethnicity. Please note that although the presence of coronary artery calcium documents the presence of coronary artery disease, the severity of this disease and any potential stenosis cannot be assessed on this noncontrast CT examination. Assessment for potential risk factor modification, dietary therapy or pharmacologic therapy may be warranted, if clinically indicated. 2. There are calcifications of the aortic valve. Echocardiographic correlation for evaluation of potential valvular dysfunction may be warranted if clinically indicated. Electronically Signed   By: Trudie Reed M.D.   On: 10/10/2022 10:39   Cryotherapy was used to destroy the wart.  3 cycles of freeze-and-thaw were performed.  Assessment & Plan:  Gastroesophageal reflux disease without esophagitis -     Basic metabolic panel; Future -     CBC with Differential/Platelet; Future -     Hepatic function panel; Future  Prediabetes- A1c is 6.2%. -     Basic metabolic panel; Future -     Hemoglobin A1c; Future  Essential hypertension- His blood pressure is well-controlled. -     Basic metabolic panel; Future -     Hemoglobin A1c; Future  Hyperlipidemia with target LDL less  than 100 - LDL goal achieved. Doing well on the statin  -     Hepatic function panel; Future -     Lipid panel; Future -     CK; Future  BPH associated with nocturia- PSA is normal. -     PSA; Future  Verruca warts (infectious) -      Follow-up: Return in about 6 months (around 10/02/2023).  Sanda Linger, MD

## 2023-04-05 ENCOUNTER — Encounter: Payer: Self-pay | Admitting: Internal Medicine

## 2023-04-05 DIAGNOSIS — N401 Enlarged prostate with lower urinary tract symptoms: Secondary | ICD-10-CM

## 2023-04-05 DIAGNOSIS — E7841 Elevated Lipoprotein(a): Secondary | ICD-10-CM

## 2023-04-05 DIAGNOSIS — E785 Hyperlipidemia, unspecified: Secondary | ICD-10-CM

## 2023-04-06 DIAGNOSIS — B079 Viral wart, unspecified: Secondary | ICD-10-CM | POA: Insufficient documentation

## 2023-04-06 MED ORDER — ALFUZOSIN HCL ER 10 MG PO TB24: 10.0000 mg | ORAL_TABLET | Freq: Every morning | ORAL | 1 refills | Status: DC

## 2023-04-06 MED ORDER — EZETIMIBE 10 MG PO TABS: 10.0000 mg | ORAL_TABLET | Freq: Every day | ORAL | 1 refills | Status: DC

## 2023-04-06 MED ORDER — ROSUVASTATIN CALCIUM 20 MG PO TABS: 20.0000 mg | ORAL_TABLET | Freq: Every day | ORAL | 1 refills | Status: DC

## 2023-04-16 ENCOUNTER — Other Ambulatory Visit: Payer: Self-pay | Admitting: Internal Medicine

## 2023-04-16 ENCOUNTER — Encounter: Payer: Self-pay | Admitting: Internal Medicine

## 2023-04-17 ENCOUNTER — Telehealth: Payer: Self-pay | Admitting: Family Medicine

## 2023-04-17 NOTE — Telephone Encounter (Signed)
Pt last seen in 2020. At that time we ordered a Rwrist brace from [facility]. He recently broke this brace and would like another one ordered.

## 2023-04-17 NOTE — Telephone Encounter (Signed)
Sent patient MyChart message.

## 2023-05-10 ENCOUNTER — Ambulatory Visit (INDEPENDENT_AMBULATORY_CARE_PROVIDER_SITE_OTHER): Payer: PPO | Admitting: Internal Medicine

## 2023-05-10 VITALS — BP 118/76 | HR 56 | Temp 97.8°F | Ht 74.0 in | Wt 193.4 lb

## 2023-05-10 DIAGNOSIS — B079 Viral wart, unspecified: Secondary | ICD-10-CM

## 2023-05-10 DIAGNOSIS — I10 Essential (primary) hypertension: Secondary | ICD-10-CM | POA: Diagnosis not present

## 2023-05-14 NOTE — Assessment & Plan Note (Signed)
His BP is well controlled 

## 2023-05-16 NOTE — Progress Notes (Unsigned)
[facility] [address] [place] [address] Phone: [phone] Subjective:   Bruce Donath, am serving as a scribe for Dr. Antoine Primas.  I'm seeing this patient by the request  of:  Etta Grandchild, MD  CC: Hand and joint pain  [id number]  Clarence Ware is a 72 y.o. male coming in with complaint of wrist pain. Last seen in 2020. Patient states the his R wrist is not painful but the brace is helpful.     Past Medical History:  Diagnosis Date   Allergy    First degree heart block    Hyperlipidemia    Hypertension    REM sleep behavior disorder    Substance abuse [facility])    Past Surgical History:  Procedure Laterality Date   INGUINAL HERNIA REPAIR Right 03/10/2015   Procedure: OPEN RIGHT INGUINAL HERNIA REPAIR;  Surgeon: Luretha Murphy, MD;  Location: [facility];  Service: General;  Laterality: Right;  With MESH   VASECTOMY     Social History   Socioeconomic History   Marital status: Married    Spouse name: Nicole Cella   Number of children: 2   Years of education: Not on file   Highest education level: Bachelor's degree (e.g., BA, AB, BS)  Occupational History   Occupation: Pharmacist part-time  Tobacco Use   Smoking status: Former    Current packs/day: 0.00    Average packs/day: 0.3 packs/day for 1 year (0.3 ttl pk-yrs)    Types: Cigarettes    Start date: 07/11/1991    Quit date: 07/10/1992    Years since quitting: 30.8   Smokeless tobacco: Never  Vaping Use   Vaping status: Never Used  Substance and Sexual Activity   Alcohol use: No    Alcohol/week: 0.0 standard drinks of alcohol   Drug use: Not Currently    Comment: sobrity for 10 years   Sexual activity: Yes  Other Topics Concern   Not on file  Social History Narrative   Married. Education: [employer].   Exercise: some   Caffeine- coffee 2 c daily   Social Determinants of Health   Financial Resource Strain: Low Risk  (05/10/2023)   Overall Financial Resource Strain  (CARDIA)    Difficulty of Paying Living Expenses: Not hard at all  Food Insecurity: No Food Insecurity (05/10/2023)   Hunger Vital Sign    Worried About Running Out of Food in the Last Year: Never true    Ran Out of Food in the Last Year: Never true  Transportation Needs: No Transportation Needs (05/10/2023)   PRAPARE - Administrator, Civil Service (Medical): No    Lack of Transportation (Non-Medical): No  Physical Activity: Sufficiently Active (05/10/2023)   Exercise Vital Sign    Days of Exercise per Week: 6 days    Minutes of Exercise per Session: 40 min  Stress: No Stress Concern Present (05/10/2023)   [employer] of Occupational Health - Occupational Stress Questionnaire    Feeling of Stress : Not at all  Social Connections: Socially Integrated (05/10/2023)   Social Connection and Isolation Panel [NHANES]    Frequency of Communication with Friends and Family: More than three times a week    Frequency of Social Gatherings with Friends and Family: Three times a week    Attends Religious Services: More than 4 times per year    Active Member of Clubs or Organizations: Yes    Attends Banker Meetings: More than 4 times  per year    Marital Status: Married   Allergies  Allergen Reactions   Nexium [Esomeprazole Magnesium]     Awful regurgitation   Family History  Problem Relation Age of Onset   Heart disease Mother    Stroke Mother    Heart disease Father 77       MI   Hypertension Father    Hypertension Brother    Cancer Brother    Diabetes Brother    Heart disease Brother    Hyperlipidemia Brother    Cancer Brother    Hyperlipidemia Sister    Coronary artery disease Brother 88       CABG   Colon cancer Paternal Grandmother     Current Outpatient Medications (Endocrine & Metabolic):    JARDIANCE 10 MG TABS tablet, Take 1 tablet by mouth once daily before breakfast.  Current Outpatient Medications (Cardiovascular):    ezetimibe (ZETIA)  10 MG tablet, Take 1 tablet (10 mg total) by mouth daily.   rosuvastatin (CRESTOR) 40 MG tablet, Take 1 tablet (40 mg total) by mouth daily.  Current Outpatient Medications (Respiratory):    cetirizine (ZYRTEC) 10 MG tablet, Take 10 mg by mouth as directed. PRN  Current Outpatient Medications (Analgesics):    aspirin EC 81 MG tablet, Take 81 mg by mouth daily.   celecoxib (CELEBREX) 100 MG capsule, Take 1 capsule (100 mg total) by mouth daily.   Current Outpatient Medications (Other):    alfuzosin (UROXATRAL) 10 MG 24 hr tablet, Take 1 tablet (10 mg total) by mouth every morning.   Cholecalciferol (VITAMIN D) 2000 units CAPS, Take 1 capsule by mouth daily.   clonazePAM (KLONOPIN) 0.5 MG tablet, Take 0.5 tablets (0.25 mg total) by mouth at bedtime as needed for anxiety.   melatonin 3 MG TABS tablet, Take 2 tablets (6 mg total) by mouth at bedtime. (Patient taking differently: Take 3 mg by mouth at bedtime.)   Reviewed prior external information including notes and imaging from  primary care provider As well as notes that were available from care everywhere and other healthcare systems.  Past medical history, social, surgical and family history all reviewed in electronic medical record.  No pertanent information unless stated regarding to the chief complaint.   Review of Systems:  No headache, visual changes, nausea, vomiting, diarrhea, constipation, dizziness, abdominal pain, skin rash, fevers, chills, night sweats, weight loss, swollen lymph nodes, body aches, joint swelling, chest pain, shortness of breath, mood changes. POSITIVE muscle aches  Objective  Blood pressure 124/74, pulse (!) 55, height 6' 2.5" (1.892 m), weight 195 lb (88.5 kg), SpO2 97%.   General: No apparent distress alert and oriented x3 mood and affect normal, dressed appropriately.  HEENT: Pupils equal, extraocular movements intact  Respiratory: Patient's speak in full sentences and does not appear short of breath   Cardiovascular: No lower extremity edema, non tender, no erythema  Wrist exam does have some tenderness over the abductor pollicis longus.  Positive Finkelstein's noted.  Patient does have some pain over the TFCC.  Good range of motion otherwise.  Neurovascularly intact  Patient foot exam does show some pes planus noted bilaterally.  Overpronation noted as well.  Arch with splaying of the first and second toe.    Impression and Recommendations:     The above documentation has been reviewed and is accurate and complete Judi Saa, DO

## 2023-05-17 ENCOUNTER — Encounter: Payer: Self-pay | Admitting: Family Medicine

## 2023-05-17 ENCOUNTER — Ambulatory Visit: Payer: PPO | Admitting: Family Medicine

## 2023-05-17 ENCOUNTER — Other Ambulatory Visit: Payer: Self-pay

## 2023-05-17 ENCOUNTER — Encounter: Payer: Self-pay | Admitting: Cardiovascular Disease

## 2023-05-17 ENCOUNTER — Ambulatory Visit: Payer: PPO | Attending: Cardiovascular Disease | Admitting: Cardiovascular Disease

## 2023-05-17 VITALS — BP 124/74 | HR 55 | Ht 74.5 in | Wt 195.0 lb

## 2023-05-17 VITALS — BP 110/66 | HR 57 | Ht 74.5 in | Wt 194.0 lb

## 2023-05-17 DIAGNOSIS — M216X9 Other acquired deformities of unspecified foot: Secondary | ICD-10-CM

## 2023-05-17 DIAGNOSIS — I10 Essential (primary) hypertension: Secondary | ICD-10-CM

## 2023-05-17 DIAGNOSIS — I440 Atrioventricular block, first degree: Secondary | ICD-10-CM

## 2023-05-17 DIAGNOSIS — M25532 Pain in left wrist: Secondary | ICD-10-CM | POA: Diagnosis not present

## 2023-05-17 DIAGNOSIS — M25531 Pain in right wrist: Secondary | ICD-10-CM | POA: Diagnosis not present

## 2023-05-17 DIAGNOSIS — E785 Hyperlipidemia, unspecified: Secondary | ICD-10-CM | POA: Diagnosis not present

## 2023-05-17 DIAGNOSIS — M216X2 Other acquired deformities of left foot: Secondary | ICD-10-CM

## 2023-05-17 DIAGNOSIS — M216X1 Other acquired deformities of right foot: Secondary | ICD-10-CM | POA: Diagnosis not present

## 2023-05-17 DIAGNOSIS — I340 Nonrheumatic mitral (valve) insufficiency: Secondary | ICD-10-CM | POA: Diagnosis not present

## 2023-05-17 DIAGNOSIS — G4752 REM sleep behavior disorder: Secondary | ICD-10-CM

## 2023-05-17 MED ORDER — ROSUVASTATIN CALCIUM 40 MG PO TABS: 40.0000 mg | ORAL_TABLET | Freq: Every day | ORAL | 3 refills | Status: DC

## 2023-05-17 NOTE — Progress Notes (Signed)
Patient ID: Clarence Ware, male   DOB: [birth year], 72 y.o.   MRN: [id number]     Primary M.D.: Dr. Elgie Congo  HPI: Clarence Ware is a 72 y.o. male presents to the office today for a 6 month followup cardiology evaluation.  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  Recently, another brother has a chronic aortic dissection for which she's been treated medically.  Clarence Ware has a history of hyperlipidemia and hypertension.  Over 10 years ago he had a normal routine treadmill test. The patient has continued to exercise regularly. There is a prior history of substance abuse for which he did undergo treatment.  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  He  has a history of hyperlipidemia, erectile dysfunction, a history of intermittent headaches.  When I saw him on 11/07/2012, we discussed the sensitivity and specificity of routine treadmill testing with a strong family history I elected to schedule him for a cardiopulmonary met test for further evaluation would also be helpful to assess endothelial dysfunction/diastolic dysfunction evaluate potential for microvascular angina. He also had a soft cardiac murmur at his apex and recommended a 2-D echo Doppler study.  His cardiopulmonary met test revealed that he had good functional capacity with an excellent maximum oxygen consumption peak of 92% of predicted. However, he had a abnormal response during the last several minutes of exercise suggesting ischemic myocardial dysfunction with decreasing stroke volume with increasing work rate which corresponded to an abrupt decrease in cardiac output suggestive of exercise-induced myocardial dysfunction. He did have very mild ventilation/perfusion mismatch  with reference to his pulmonary status.  A 2-D echo Doppler study showed normal systolic function with an ejection fraction of 55-60%. He had normal diastolic parameters.  When I saw him in followup of his noninvasive studies in June 2014 due to the mild abnormality noted during the last 7 minutes of exercise I recommended the addition of ARB therapy and further titrated his atorvastatin for aggressive lipid management. He has tolerated initiation of losartan.  I last saw him on most 2 years ago.  He denied any  chest pain or palpitations. He He did have a followup NMR lipoprotein of which showed an LDL particle number of 1216 with a calculated LDL of 86, HDL 46, triglycerides 58, total cholesterol 144. He had 552 small LDL some particles. HDL particle number was reduced at 29.1. Insulin resistance score was normal at 33.  He is working as a Teacher, adult education on [employer] patients and is not exercising as he had in the past.  However, he is still walking at least 3 days per week for a minimum of 30 minutes.  He underwent a sleep study at [facility] sleep at [facility] on 05/03/201 and was not found to have significant obstructive sleep apnea and his AHI and RDI were both 2.9.  He has significant periodic limb movements of sleep and had snoring.  He tells me he had a mouth piece customized by Dr. Althea Grimmer for treatment of his snoring.   I saw him in January 2019.  He denies any episodes of chest pain or shortness of breath.  He is unaware of palpitations.  He uses his mouthguard to  sleep and denies any awareness of breakthrough snoring.  He continues to be on losartan 25 mg daily, and metoprolol succinate 25 mg daily for hypertension.  He has continued to be on atorvastatin 20 mg.  Of note, laboratory 6 months ago showed an LDL cholesterol, which had risen up to 90.  Diet evaluation I further titrated atorvastatin to 40 mg.  Over the last 6 months he has continued to remain stable.  He  underwent repeat laboratory on his increased atorvastatin which had not significantly changed.  Total cholesterol was 150, triglycerides 63, HDL 48, LDL 89.  He denies chest pain PND orthopnea.  He denies shortness of breath.  He has noticed some rare dizziness particularly when he bends over.    He was  evaluated in a telemedicine visit on October 11, 2018.  Prior to his evaluation in June 2019 he had self reduced his metoprolol succinate from 25 mg down to 12.5 mg due to noticing that his heart rate was morning somewhat slow.  He started to notice that particularly following exertion his blood pressure at times have gotten low and he has recorded blood pressure levels of 114/71 with a pulse of 67, 106/68 with a pulse of 65, 95/60 with a pulse of 63 and on 1 occasion systolic blood pressure had dropped to as low as 88.  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  He denied any episodes of chest pain.  He was taking losartan 25 mg daily in addition to Toprol-XL 12.5 mg daily and was on atorvastatin 40 mg and Zetia 10 mg for hyperlipidemia.  In January 2020 LDL cholesterol was 70.  He continues to take aspirin.  He admits to some fatigue associated with bradycardia.  He was using a customized oral appliance for his mild sleep apnea and followed by Dr. Althea Grimmer for his snoring/sleep apnea.  I saw him in June 2021 and since his previous evaluation he was having significant dreaming and falling out of bed diving due to potential REM related sleep disorder.  He ultimately was referred to Dr. Porfirio Mylar Dohmeier and apparently underwent an MRI  for assessment.  He is aware of the association of potential future Parkinson disease or Lewy body disorder associated with REM behavior parasomnias.  He does not know the results of his MRI imaging.   He denies chest pain or palpitations.  Sometimes if he stands abruptly he may note mild dizzy spells but this has improved with discontinuance of atenolol and switching to carvedilol.  I saw him on August 04, 2020 at which time he remained stable and was trying to slow down and has been working only approximately 7 days/month.    He has not had any further significant night terrors he is on clonazepam in addition to melatonin 6 mg at bedtime.  20 pounds over the past year.  He has continued to be on losartan 25 mg.  He is on atorvastatin 40 mg and Zetia 10 mg for hyperlipidemia.  He sees Dr. Sanda Linger who checks his laboratory.    I saw him on March 09, 2021.  At that time, with his history of REM sleep night terrors he is now involved in a Parkinson's disease study sponsored by the [employer].  He  is now fully retired.  He is using an oral appliance for sleep apnea followed by Dr. Myrtis Ser.  He denies chest pain or shortness of breath.  He is now on melatonin for his night terror and has a prescription for clonazepam but has never taken this.  He continues to be on atorvastatin 40 mg and Zetia 10 mg for hyperlipidemia.    When I saw him on September 19, 2021 he felt well.  Since his retirement at the end of August 2022 he has more time and exercises regularly and walking, swimming, water aerobics.  He denies chest pain or palpitations.  He continues to be on atorvastatin 40 mg  and Zetia 10 mg for hyperlipidemia, olmesartan 40 mg for hypertension, and Zyrtec on an as-needed basis.  His blood pressure has been stable.  His LDL cholesterol in February 2023 was 66.  He was taking melatonin and is involved in a future potential Parkinson's disease study in light of his REM sleep night terrors.  I last saw him on Nov 14, 2022. Presently, he feels well and is without chest pain or shortness of breath.  He is exercising daily for at least 30 minutes.  He swims approximately 3 to 4 days/week.  He  underwent coronary calcium score on October 10, 2022.  Calcium score was 213 placing him at 53rd percentile.  Calcium score in the left main was 35, LAD 175, circumflex 0, and RCA 2. He also was noted to have calcification of his aortic valve. He underwent an echo Doppler study on October 13, 2022 which showed normal LV function with EF 60 to 65%.  There was mild LVH.  There was grade 1 diastolic dysfunction.  He had mild mitral regurgitation.  There was moderate calcification of the trileaflet aortic valve without evidence for stenosis.  He continues to be on atorvastatin 40 mg and Zetia 10 mg.  He is on Jardiance 10 mg.  He continues to take melatonin 3 mg at bedtime.  He is on aspirin 81 mg.    Since I last saw him, he has felt well.  At times he notes occasional dizziness particularly when climbing steps.  He remains active and swims and walks regularly.  He continues to undergo periodic evaluation with his RAM sleep behavior disorder and will be having a yearly assessment.  He denies recent palpitations.  There is no chest pain.  He is on Zetia at 10 mg and rosuvastatin 20 mg for hyperlipidemia.  He takes Klonopin 0.5 mg on an as needed basis.  He takes a baby aspirin.  He had undergone laboratory on April 04, 2023.  CBC and chemistry was stable.  Glucose was minimally increased at 104.  Lipid studies were excellent with total cholesterol 132, triglycerides 59, HDL 56 and LDL at 64.  He was found to have elevated LP(a) on assessment on Nov 14, 2022 at 167.7.  He presents for evaluation.   Past Medical History:  Diagnosis Date   Allergy    First degree heart block    Hyperlipidemia    Hypertension    REM sleep behavior disorder    Substance abuse [facility])     Past Surgical History:  Procedure Laterality Date   INGUINAL HERNIA REPAIR Right 03/10/2015   Procedure: OPEN RIGHT INGUINAL HERNIA REPAIR;  Surgeon: Luretha Murphy, MD;  Location: [facility];  Service: General;  Laterality: Right;  With MESH    VASECTOMY      Allergies  Allergen Reactions  Nexium [Esomeprazole Magnesium]     Awful regurgitation    Current Outpatient Medications  Medication Sig Dispense Refill   alfuzosin (UROXATRAL) 10 MG 24 hr tablet Take 1 tablet (10 mg total) by mouth every morning. 90 tablet 1   aspirin EC 81 MG tablet Take 81 mg by mouth daily.     celecoxib (CELEBREX) 100 MG capsule Take 1 capsule (100 mg total) by mouth daily. 90 capsule 0   cetirizine (ZYRTEC) 10 MG tablet Take 10 mg by mouth as directed. PRN     Cholecalciferol (VITAMIN D) 2000 units CAPS Take 1 capsule by mouth daily.     clonazePAM (KLONOPIN) 0.5 MG tablet Take 0.5 tablets (0.25 mg total) by mouth at bedtime as needed for anxiety. 45 tablet 2   ezetimibe (ZETIA) 10 MG tablet Take 1 tablet (10 mg total) by mouth daily. 90 tablet 1   JARDIANCE 10 MG TABS tablet Take 1 tablet by mouth once daily before breakfast. 90 tablet 1   melatonin 3 MG TABS tablet Take 2 tablets (6 mg total) by mouth at bedtime. (Patient taking differently: Take 3 mg by mouth at bedtime.) 180 tablet 1   rosuvastatin (CRESTOR) 40 MG tablet Take 1 tablet (40 mg total) by mouth daily. 90 tablet 3   No current facility-administered medications for this visit.    Socially he is married has 2 children no grandchildren. He does exercise at least 30 minutes at a time. There is a prior history of tobacco use but he quit over 3 years ago and a prior use of opiates. 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  ROS General: Negative; No fevers, chills, or night sweats;  HEENT: Negative; No changes in vision or hearing, sinus congestion, difficulty swallowing Pulmonary: Negative; No cough, wheezing, shortness of breath, hemoptysis Cardiovascular: See history of present illness GI: Negative; No nausea, vomiting, diarrhea, or abdominal pain GU: Negative; No dysuria, hematuria, or difficulty  voiding Musculoskeletal: Negative; no myalgias, joint pain, or weakness Hematologic/Oncology: Negative; no easy bruising, bleeding Endocrine: Negative; no heat/cold intolerance; no diabetes Neuro: Negative; no changes in balance, headaches Skin: Negative; No rashes or skin lesions Psychiatric: Negative; No behavioral problems, depression Sleep: Sleep study negative for OSA but suspicious for increased upper airway resistance.  He did have periodic limb movements that were frequent.  No hypnogognic hallucinations, no cataplexy.  REM related sleep disorder. Other comprehensive 14 point system review is negative.  PE BP 110/66   Pulse (!) 57   Ht 6' 2.5" (1.892 m)   Wt 194 lb (88 kg)   SpO2 98%   BMI 24.57 kg/m    Repeat blood pressure by me was 124/70 supine and 108/68 standing  Wt Readings from Last 3 Encounters:  05/17/23 195 lb (88.5 kg)  05/17/23 194 lb (88 kg)  05/10/23 193 lb 6.4 oz (87.7 kg)   General: Alert, oriented, no distress.  Skin: normal turgor, no rashes, warm and dry HEENT: Normocephalic, atraumatic. Pupils equal round and reactive to light; sclera anicteric; extraocular muscles intact;  Nose without nasal septal hypertrophy Mouth/Parynx benign; Mallinpatti scale 3 Neck: No JVD, no carotid bruits; normal carotid upstroke Lungs: clear to ausculatation and percussion; no wheezing or rales Chest wall: without tenderness to palpitation Heart: PMI not displaced, RRR, s1 s2 normal, 1/6 systolic murmur at apex;, no diastolic murmur, no rubs, gallops, thrills, or heaves Abdomen: soft, nontender; no hepatosplenomehaly, BS+; abdominal  aorta nontender and not dilated by palpation. Back: no CVA tenderness Pulses 2+ Musculoskeletal: full range of motion, normal strength, no joint deformities Extremities: no clubbing cyanosis or edema, Homan's sign negative  Neurologic: grossly nonfocal; Cranial nerves grossly wnl Psychologic: Normal mood and affect   EKG  Interpretation Date/Time:  Thursday May 17 2023 08:19:47 EST Ventricular Rate:  57 PR Interval:  278 QRS Duration:  88 QT Interval:  398 QTC Calculation: 387 R Axis:   39  Text Interpretation: Sinus bradycardia with 1st degree A-V block No previous ECGs available Confirmed by Nicki Guadalajara ([id number]) on 05/17/2023 8:41:54 AM    Nov 14, 2022 ECG (independently read by me): Low atrial rhythm at 59, no ectopy  September 19, 2021 ECG (independently read by me): NSR at 64, 1st degree AV block; PR 240 msec, no ectopy  August 31,2022 ECG (independently read by me): Sinus rhythm at 64; 1st degree AV block    August 05, 2019 ECG (independently read by me): Sinus rhythm at 63, 1st degree AV bloick: PR 236  June 2021 ECG (independently read by me): Sinus rhythm at 64; 1st degree AV block, PR 232    msec; no ectopy  November 2020 ECG (independently read by me): Sinus bradycardia 55 bpm, first-degree AV block with a PR interval 238 ms.  QTc interval 380 ms.  No ectopy.  June 2019 ECG (independently read by me): Normal sinus rhythm at 62 bpm.  First-degree AV block with a PR interval at 240 ms.  No ectopy.  Normal intervals.   July 18, 2017 ECG (independently read by me): Normal sinus rhythm with an isolated PVC.  PR interval 202 ms.  Heart rate 63 bpm.  October 2017 ECG (independently read by me): Sinus bradycardia 57 bpm.  First-degree AV block with a PR interval at 228 ms.  No ST segment changes.  June 2016 ECG (independently read by me): Sinus rhythm with first-degree AV block.  PR interval 264 ms.  QTc interval normal 381 ms.  September 2014 ECG: Sinus bradycardia for the first degree AV block. PR interval  220 ms. QT interval 393 ms.  LABS:    Latest Ref Rng & Units 04/04/2023    8:35 AM 09/11/2022    8:06 AM 12/13/2021   11:50 AM  BMP  Glucose 70 - 99 mg/dL 528  96  95   BUN 6 - 23 mg/dL 21  20  20    Creatinine 0.40 - 1.50 mg/dL 4.13  2.44  0.10   Sodium 135 - 145 mEq/L 139  140  139    Potassium 3.5 - 5.1 mEq/L 4.8  4.1  4.5   Chloride 96 - 112 mEq/L 103  104  104   CO2 19 - 32 mEq/L 29  28  29    Calcium 8.4 - 10.5 mg/dL 9.8  9.7  9.5       Latest Ref Rng & Units 04/04/2023    8:35 AM 09/11/2022    8:06 AM 09/01/2021   11:40 AM  Hepatic Function  Total Protein 6.0 - 8.3 g/dL 6.7  6.6  6.5   Albumin 3.5 - 5.2 g/dL 4.4  4.0  4.3   AST 0 - 37 U/L 22  25  29    ALT 0 - 53 U/L 22  29  32   Alk Phosphatase 39 - 117 U/L 74  80  85   Total Bilirubin 0.2 - 1.2 mg/dL 0.7  0.5  0.8   Bilirubin, Direct  0.0 - 0.3 mg/dL 0.1  0.1  0.2       Latest Ref Rng & Units 04/04/2023    8:35 AM 09/11/2022    8:06 AM 12/13/2021   11:50 AM  CBC  WBC 4.0 - 10.5 K/uL 5.8  6.2  5.0   Hemoglobin 13.0 - 17.0 g/dL 78.2  95.6  21.3   Hematocrit 39.0 - 52.0 % 44.3  42.6  38.9   Platelets 150.0 - 400.0 K/uL 187.0  184.0  197.0    Lab Results  Component Value Date   MCV 91.2 04/04/2023   MCV 91.1 09/11/2022   MCV 91.4 12/13/2021   Lab Results  Component Value Date   TSH 2.57 09/11/2022   Lab Results  Component Value Date   HGBA1C 6.2 04/04/2023     Lipid Panel     Component Value Date/Time   CHOL 132 04/04/2023 0835   CHOL 150 10/24/2017 0809   CHOL 144 03/03/2013 0855   TRIG 59.0 04/04/2023 0835   TRIG 58 03/03/2013 0855   HDL 56.40 04/04/2023 0835   HDL 48 10/24/2017 0809   HDL 46 03/03/2013 0855   CHOLHDL 2 04/04/2023 0835   VLDL 11.8 04/04/2023 0835   LDLCALC 64 04/04/2023 0835   LDLCALC 89 10/24/2017 0809   LDLCALC 86 03/03/2013 0855     RADIOLOGY:   ECHO: 10/13/2022  1. Left ventricular ejection fraction, by estimation, is 60 to 65%. The  left ventricle has normal function. The left ventricle has no regional  wall motion abnormalities. There is mild left ventricular hypertrophy.  Left ventricular diastolic parameters  are consistent with Grade I diastolic dysfunction (impaired relaxation).   2. Right ventricular systolic function is normal. The right ventricular   size is normal.   3. The mitral valve is normal in structure. Mild mitral valve  regurgitation. No evidence of mitral stenosis.   4. The aortic valve is calcified. There is moderate calcification of the  aortic valve. Aortic valve regurgitation is not visualized. Aortic valve  sclerosis is present, with no evidence of aortic valve stenosis. Aortic  valve mean gradient measures 5.0  mmHg. Aortic valve Vmax measures 1.58 m/s.   5. The inferior vena cava is normal in size with greater than 50%  respiratory variability, suggesting right atrial pressure of 3 mmHg.   IMPRESSION:  1. Essential hypertension   2. Hyperlipidemia with target LDL less than 50   3. h/o First degree heart block   4. REM sleep behavior disorder   5. Mild mitral regurgitation     ASSESSMENT AND PLAN: Clarence. 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  He has had first-degree block.  In the past, he has had some episodes of mild dizziness particularly if he bends over which has resulted in reduction in his beta-blocker regimen.  Remotely, his atenolol dose was switched to carvedilol low-dose which had been beneficial.  On subsequent evaluations, he continued to experience the dizziness and it was recommended that he ultimately wean and discontinue carvedilol.  He also had been started on olmesartan in place of losartan.  However, when last seen, his blood pressure was excellent and he was no longer on medication.  He did not have any significant orthostasis.  He had undergone a CT  cardiac score which showed a score of 213 Agatston units with left main calcification at 35, LAD 175, and RCA at 2. His most recent echo Doppler study which shows normal LV function with mild LVH and grade 1 diastolic  dysfunction.  There is evidence for aortic sclerosis without stenosis.  When last seen in May 2024 ECG showed a low atrial rhythm with negative P wave inferiorly.  ECG today shows sinus bradycardia 57 bpm with first-degree AV block.  He continues to be on Zetia and rosuvastatin 20 mg for hyperlipidemia.  However, with his elevated LP(a) at 167.7, I have recommended more aggressive titration of rosuvastatin to 40 mg with target LDL less than 50 if at all possible.  He will be undergoing repeat evaluation per [facility] [facility] for his random sleep behavior disorder as a screening for potential future Parkinson's disease or Lewy body disorder.  He has his previously noted 1/6 murmur at apex consistent with his mild mitral regurgitation.  In 4 months I am recommending a follow-up comprehensive metabolic panel and lipid studies.  I will see him in 5 to 6 months for reevaluation or sooner as needed.   Lennette Bihari, MD, [facility]  05/18/2023 12:04 PM

## 2023-05-17 NOTE — Assessment & Plan Note (Signed)
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  Has had difficulty with foot pain previously and did well with occupational therapy.  Will get patient to get new orthotics with his being over 72 years old at the moment.

## 2023-05-17 NOTE — Assessment & Plan Note (Signed)
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  Patient has done well with the bracing but does seem that the brace is greater than 73 years old and causing more discomfort at the moment.  Do feel that a referral to occupational therapy for bracing would be the most beneficial for this individual to allow him to continue to be active.  In addition to this discussed continuing to stay active and icing regimen no new medications.  Follow-up as needed

## 2023-05-17 NOTE — Patient Instructions (Signed)
Medication Instructions:  Start taking Rosuvastatin 40 MG by mouth daily *If you need a refill on your cardiac medications before your next appointment, please call your pharmacy*   Lab Work: Please return for Blood Work in 4 Months, on or around September 14, 2023. No appointment needed, lab here at the office is open Monday-Friday from 8AM to 4PM and closed daily for lunch from 12:45-1:45. DO not eat or drink after midnight prior to coming in for Lab work.  CMET Lipid  Testing/Procedures: None Ordered  Follow-Up: At [facility], you and your health needs are our priority.  As part of our continuing mission to provide you with exceptional heart care, we have created designated Provider Care Teams.  These Care Teams include your primary Cardiologist (physician) and Advanced Practice Providers (APPs -  Physician Assistants and Nurse Practitioners) who all work together to provide you with the care you need, when you need it.  We recommend signing up for the patient portal called "MyChart".  Sign up information is provided on this After Visit Summary.  MyChart is used to connect with patients for Virtual Visits (Telemedicine).  Patients are able to view lab/test results, encounter notes, upcoming appointments, etc.  Non-urgent messages can be sent to your provider as well.   To learn more about what you can do with MyChart, go to [email or web address].    Your next appointment:   6 month(s)  Provider:   Nicki Guadalajara, MD

## 2023-05-17 NOTE — Patient Instructions (Addendum)
Referral to OT sent-They will call you [facility] will call you to schedule for orthotics See me as needed

## 2023-05-18 ENCOUNTER — Encounter: Payer: Self-pay | Admitting: Cardiovascular Disease

## 2023-05-21 ENCOUNTER — Ambulatory Visit: Payer: PPO | Admitting: Family Medicine

## 2023-05-21 VITALS — BP 122/70 | Ht 74.5 in | Wt 190.0 lb

## 2023-05-21 DIAGNOSIS — M216X1 Other acquired deformities of right foot: Secondary | ICD-10-CM | POA: Diagnosis not present

## 2023-05-21 DIAGNOSIS — M79671 Pain in right foot: Secondary | ICD-10-CM

## 2023-05-21 DIAGNOSIS — M216X2 Other acquired deformities of left foot: Secondary | ICD-10-CM | POA: Diagnosis not present

## 2023-05-21 DIAGNOSIS — M79672 Pain in left foot: Secondary | ICD-10-CM

## 2023-05-21 NOTE — Progress Notes (Signed)
DATE OF VISIT: 05/21/2023        Clarence Ware DOB: 11-29-1950 MRN: [id number]  CC:  Orthotics  History of present Illness: Clarence Ware is a 72 y.o. male who presents for orthotics. Referred by Dr Terrilee Files for orthotics - last seen 05/17/23 - has significant transverse arch collapse and pronation deformity of hindfoot - having foot pain for many years - not much foot pain currently - current orthotics are over 20 years ago  Medications:  Outpatient Encounter Medications as of 05/21/2023  Medication Sig   alfuzosin (UROXATRAL) 10 MG 24 hr tablet Take 1 tablet (10 mg total) by mouth every morning.   aspirin EC 81 MG tablet Take 81 mg by mouth daily.   celecoxib (CELEBREX) 100 MG capsule Take 1 capsule (100 mg total) by mouth daily.   cetirizine (ZYRTEC) 10 MG tablet Take 10 mg by mouth as directed. PRN   Cholecalciferol (VITAMIN D) 2000 units CAPS Take 1 capsule by mouth daily.   clonazePAM (KLONOPIN) 0.5 MG tablet Take 0.5 tablets (0.25 mg total) by mouth at bedtime as needed for anxiety.   ezetimibe (ZETIA) 10 MG tablet Take 1 tablet (10 mg total) by mouth daily.   JARDIANCE 10 MG TABS tablet Take 1 tablet by mouth once daily before breakfast.   melatonin 3 MG TABS tablet Take 2 tablets (6 mg total) by mouth at bedtime. (Patient taking differently: Take 3 mg by mouth at bedtime.)   rosuvastatin (CRESTOR) 40 MG tablet Take 1 tablet (40 mg total) by mouth daily.   No facility-administered encounter medications on file as of 05/21/2023.    Allergies: is allergic to nexium [esomeprazole magnesium].  Physical Examination: Vitals: BP 122/70   Ht 6' 2.5" (1.892 m)   Wt 190 lb (86.2 kg)   BMI 24.07 kg/m  GENERAL:  Clarence Ware is a 72 y.o. male appearing their stated age, alert and oriented x 3, in no apparent distress.  SKIN: no rashes or lesions, skin clean, dry, intact MSK: b/l feet with Morton's toe, b/l over pronation (Rt>Lt), some longitudinal arch collapse, some  transverse arch collapse with slight splaying between 1st and 2nd toes.   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  Felt comfortable after fabrication and had neutral gait - can reach out with any questions or concerns, otherwise can f/u prn Foot pain, bilateral B/L pronation of feet with associated foot pain, has done well with previous custom orthotics that are 66+ years old  PLAN: - new custom orthotics made today as noted below.  Felt comfortable after fabrication and had neutral gait - can reach out with any questions or concerns, otherwise can f/u prn  ORTHOTICS PREPARATION: Patient was fitted for a : standard, cushioned, semi-rigid orthotic. The orthotic was heated, placed on the orthotic stand. The patient was positioned in subtalar neutral position and 10 degrees of ankle dorsiflexion in a weight bearing stance on the heated orthotic blank After completion of molding, a stable base was applied to the orthotic blank. The blank was ground to a stable position for weight bearing. Blank: size 11 Base:blue EVA Posting:none     Patient expressed understanding & agreement with above.  Encounter Diagnoses  Name Primary?   Pronation deformity of both feet Yes   Foot pain, bilateral  No orders of the defined types were placed in this encounter.

## 2023-05-21 NOTE — Assessment & Plan Note (Signed)
B/L pronation of feet with associated foot pain, has done well with previous custom orthotics that are 16+ years old  PLAN: - new custom orthotics made today as noted below.  Felt comfortable after fabrication and had neutral gait - can reach out with any questions or concerns, otherwise can f/u prn

## 2023-05-22 DIAGNOSIS — R3915 Urgency of urination: Secondary | ICD-10-CM | POA: Diagnosis not present

## 2023-05-22 DIAGNOSIS — N401 Enlarged prostate with lower urinary tract symptoms: Secondary | ICD-10-CM | POA: Diagnosis not present

## 2023-05-22 DIAGNOSIS — R350 Frequency of micturition: Secondary | ICD-10-CM | POA: Diagnosis not present

## 2023-05-23 NOTE — Therapy (Unsigned)
OUTPATIENT OCCUPATIONAL THERAPY ORTHO EVALUATION  Patient Name: Clarence Ware MRN: [id number] DOB:June 30, 1951, 72 y.o., male Today's Date: 05/24/2023  PCP: Dr. Yetta Barre REFERRING PROVIDER: Dr. Katrinka Blazing  END OF SESSION:  OT End of Session - 05/24/23 1319     Visit Number 1    Number of Visits 5    Date for OT Re-Evaluation 07/19/23    Authorization Type HTA    Authorization - Visit Number 1    Progress Note Due on Visit 10    OT Start Time 0805    OT Stop Time 0915    OT Time Calculation (min) 70 min             Past Medical History:  Diagnosis Date   Allergy    First degree heart block    Hyperlipidemia    Hypertension    REM sleep behavior disorder    Substance abuse (HCC)    Past Surgical History:  Procedure Laterality Date   INGUINAL HERNIA REPAIR Right 03/10/2015   Procedure: OPEN RIGHT INGUINAL HERNIA REPAIR;  Surgeon: Luretha Murphy, MD;  Location: [facility];  Service: General;  Laterality: Right;  With MESH   VASECTOMY     Patient Active Problem List   Diagnosis Date Noted   Right wrist pain 05/17/2023   Pronation deformity of both feet 05/17/2023   Verruca warts (infectious) 04/06/2023   BPH associated with nocturia 04/04/2023   Chronic heart failure with preserved ejection fraction (HFpEF) (HCC) 10/14/2022   Need for vaccination 09/12/2022   Sleep related bruxism 08/24/2022   UARS (upper airway resistance syndrome) 08/24/2022   Primary osteoarthritis of both knees 12/13/2021   Cerebellar ataxia in diseases classified elsewhere (HCC) 09/01/2021   Seborrheic keratosis 09/01/2021   BPH with urinary obstruction 09/01/2021   Age-related cognitive decline 08/05/2020   Uncontrolled REM sleep behavior disorder 03/16/2020   Routine general medical examination at a health care facility 01/18/2017   Essential hypertension 12/18/2014   Hyperlipidemia with target LDL less than 100 12/18/2014   First degree heart block 03/30/2013   Opioid type dependence (HCC)  08/10/2010   Prediabetes 06/30/2010   GERD 06/26/2008   ERECTILE DYSFUNCTION, MILD 01/30/2008   COLON POLYP 09/06/2006    ONSET DATE: 05/17/23  REFERRING DIAG: R wrist pain  THERAPY DIAG:  Pain in right wrist - Plan: Ot plan of care cert/re-cert  Rationale for Evaluation and Treatment: Rehabilitation  SUBJECTIVE:   SUBJECTIVE STATEMENT: Pt arrives with his splint from 4 years ago, and reports he would like a new splint Pt accompanied by: self  PERTINENT HISTORY: Per Dr. 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  Patient has done well with the bracing but does seem that the brace is greater than 10 years old and causing more discomfort at the moment.   PRECAUTIONS: None   PAIN:  Are you having pain? No No pain at eval, however pt reports mild wrist pain 2-3/10 with certain positions, Pt with positive Finklestein's Wrist brace alleviates pain  FALLS: Has patient fallen in last 6 months? No  LIVING ENVIRONMENT: Lives with: lives with their family and lives with their spouse Lives in: House/apartment   PLOF: Independent  PATIENT GOALS: new wrist brace  NEXT MD VISIT: prn  OBJECTIVE:  Note: Objective measures were completed at Evaluation unless otherwise noted.  HAND DOMINANCE: Right  ADLs:I with all basic ADLs, pt reports he wears  his brace for activities requiring repetition and lifting for pain management   FUNCTIONAL OUTCOME MEASURES: Quick Dash: 1.09% disability  UPPER EXTREMITY ROM:   NT     SENSATION: Not tested  EDEMA: no edema noted  COGNITION: Overall cognitive status: Within functional limits for tasks assessed   OBSERVATIONS: Pleasant male, familaiar to this therapist from previous OT in 2020   TODAY'S TREATMENT:                                                                                                                               DATE: 05/24/23-Pt arrived wearing old splint. Pt was fitted with a new thumb spica splint and educated in wear, care and precautions.    PATIENT EDUCATION: Education details: role of OT, splint wear,care and precautions. Person educated: Patient Education method: Explanation, Demonstration, Verbal cues, and Handouts Education comprehension: verbalized understanding and returned demonstration  HOME EXERCISE PROGRAM: n/a  GOALS: Goals reviewed with patient? Yes    LONG TERM GOALS: Target date: 08/19/23  I with splint wear care and precautions following 1-2 weeks to ensure proper fit  Goal status: INITIAL   ASSESSMENT:  CLINICAL IMPRESSION: Patient is a 72 y.o. male who was seen today for occupational therapy evaluation for R wrist pain . Per Dr. 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  Patients splint is 73 years old and he arrives to receive and updated brace. Pt can benefit from skilled occupational therapy for updated splinting and patient education in order to minimize pain, maximize pt indpendence and risk for injury. PERFORMANCE DEFICITS: in functional skills including ADLs, IADLs, coordination, dexterity, ROM, strength, pain, and flexibility,  and psychosocial skills including coping strategies, environmental adaptation, habits, interpersonal interactions, and routines and behaviors.   IMPAIRMENTS: are limiting patient from ADLs, IADLs, rest and sleep, work, play, leisure, and social participation.   COMORBIDITIES: may have co-morbidities  that affects occupational performance. Patient will benefit from skilled OT to address above impairments and improve overall function.  MODIFICATION OR ASSISTANCE TO COMPLETE EVALUATION: No modification of tasks or assist necessary to complete an evaluation.  OT OCCUPATIONAL PROFILE AND HISTORY: Detailed assessment: Review of records and  additional review of physical, cognitive, psychosocial history related to current functional performance.  CLINICAL DECISION MAKING: LOW - limited treatment options, no task modification necessary  REHAB POTENTIAL: Good  EVALUATION COMPLEXITY: Low      PLAN:  OT FREQUENCY:  4 visits plus eval  OT DURATION: 8 weeks  PLANNED INTERVENTIONS: 97168 OT Re-evaluation, 97535 self care/ADL training, 40981 therapeutic exercise, 97760 Orthotics management and training, 19147 Splinting (initial encounter), [phone] Subsequent splinting/medication, passive range of motion, coping strategies training, patient/family education, and DME and/or AE instructions  RECOMMENDED OTHER SERVICES: n/a  CONSULTED AND AGREED WITH PLAN OF CARE: Patient  PLAN FOR NEXT SESSION: splint check and modification prn  Sanyiah Kanzler, OT 05/24/2023, 3:24 PM

## 2023-05-24 ENCOUNTER — Ambulatory Visit: Payer: PPO | Attending: Family Medicine | Admitting: Occupational Therapy

## 2023-05-24 ENCOUNTER — Encounter: Payer: Self-pay | Admitting: Occupational Therapy

## 2023-05-24 DIAGNOSIS — M25531 Pain in right wrist: Secondary | ICD-10-CM | POA: Diagnosis not present

## 2023-05-24 NOTE — Patient Instructions (Signed)
Your Splint This splint should initially be fitted by a healthcare practitioner.  The healthcare practitioner is responsible for providing wearing instructions and precautions to the patient, other healthcare practitioners and care provider involved in the patient's care.  This splint was custom made for you. Please read the following instructions to learn about wearing and caring for your splint.  Precautions Should your splint cause any of the following problems, remove the splint immediately and contact your therapist/physician. Swelling Severe Pain Pressure Areas Stiffness Numbness Stop wearing if pain or pressurea areas Do not wear your splint while operating machinery unless it has been fabricated for that purpose.  When To Wear Your Splint Where your splint according to your therapist/physician instructions. During repetative activities  Care and Cleaning of Your Splint Keep your splint away from open flames. Your splint will lose its shape in temperatures over 135 degrees Farenheit, ( in car windows, near radiators, ovens or in hot water).  Never make any adjustments to your splint, if the splint needs adjusting remove it and make an appointment to see your therapist. Your splint, including the cushion liner may be cleaned with soap and lukewarm water.  Do not immerse in hot water over 135 degrees Farenheit. Straps may be washed with soap and water, but do not moisten the self-adhesive portion.

## 2023-05-31 ENCOUNTER — Ambulatory Visit: Payer: PPO | Admitting: Occupational Therapy

## 2023-05-31 DIAGNOSIS — M25531 Pain in right wrist: Secondary | ICD-10-CM

## 2023-05-31 NOTE — Therapy (Signed)
OUTPATIENT OCCUPATIONAL THERAPY ORTHO EVALUATION  Patient Name: Clarence Ware MRN: [id number] DOB:11/15/1950, 72 y.o., male Today's Date: 05/31/2023  PCP: Dr. Yetta Barre REFERRING PROVIDER: Dr. Katrinka Blazing  END OF SESSION:  OT End of Session - 05/31/23 1233     Visit Number 2    Number of Visits 5    Date for OT Re-Evaluation 07/19/23    Authorization Type HTA    Authorization - Visit Number 2    Progress Note Due on Visit 10    OT Start Time 1010    OT Stop Time 1048    OT Time Calculation (min) 38 min    Activity Tolerance Patient tolerated treatment well    Behavior During Therapy WFL for tasks assessed/performed              Past Medical History:  Diagnosis Date   Allergy    First degree heart block    Hyperlipidemia    Hypertension    REM sleep behavior disorder    Substance abuse (HCC)    Past Surgical History:  Procedure Laterality Date   INGUINAL HERNIA REPAIR Right 03/10/2015   Procedure: OPEN RIGHT INGUINAL HERNIA REPAIR;  Surgeon: Luretha Murphy, MD;  Location: [facility];  Service: General;  Laterality: Right;  With MESH   VASECTOMY     Patient Active Problem List   Diagnosis Date Noted   Right wrist pain 05/17/2023   Pronation deformity of both feet 05/17/2023   Verruca warts (infectious) 04/06/2023   BPH associated with nocturia 04/04/2023   Chronic heart failure with preserved ejection fraction (HFpEF) (HCC) 10/14/2022   Need for vaccination 09/12/2022   Sleep related bruxism 08/24/2022   UARS (upper airway resistance syndrome) 08/24/2022   Primary osteoarthritis of both knees 12/13/2021   Cerebellar ataxia in diseases classified elsewhere (HCC) 09/01/2021   Seborrheic keratosis 09/01/2021   BPH with urinary obstruction 09/01/2021   Age-related cognitive decline 08/05/2020   Uncontrolled REM sleep behavior disorder 03/16/2020   Routine general medical examination at a health care facility 01/18/2017   Essential hypertension 12/18/2014    Hyperlipidemia with target LDL less than 100 12/18/2014   First degree heart block 03/30/2013   Opioid type dependence (HCC) 08/10/2010   Prediabetes 06/30/2010   GERD 06/26/2008   ERECTILE DYSFUNCTION, MILD 01/30/2008   COLON POLYP 09/06/2006    ONSET DATE: 05/17/23  REFERRING DIAG: R wrist pain  THERAPY DIAG:  Pain in right wrist  Rationale for Evaluation and Treatment: Rehabilitation  SUBJECTIVE:   SUBJECTIVE STATEMENT: Pt reports that splint is rubbing some Pt accompanied by: self  PERTINENT HISTORY: Per Dr. 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  Patient has done well with the bracing but does seem that the brace is greater than 1 years old and causing more discomfort at the moment.   PRECAUTIONS: None   PAIN:  Are you having pain? No No pain at eval, however pt reports mild wrist pain 2-3/10 with certain positions, Pt with positive Finklestein's Wrist brace alleviates pain  FALLS: Has patient fallen in last 6 months? No  LIVING ENVIRONMENT: Lives with: lives with their family and lives with their spouse Lives in: House/apartment   PLOF: Independent  PATIENT GOALS: new wrist brace  NEXT MD VISIT: prn  OBJECTIVE:  Note: Objective measures were completed at Evaluation unless otherwise noted.  HAND DOMINANCE: Right  ADLs:I with all basic ADLs, pt  reports he wears his brace for activities requiring repetition and lifting for pain management   FUNCTIONAL OUTCOME MEASURES: Quick Dash: 1.09% disability  UPPER EXTREMITY ROM:   NT     SENSATION: Not tested  EDEMA: no edema noted  COGNITION: Overall cognitive status: Within functional limits for tasks assessed   OBSERVATIONS: Pleasant male, familaiar to this therapist from previous OT in 2020   TODAY'S TREATMENT:                                                                                                                               DATE: 05/31/23- Pt arrived with the splint fabricated last visit. He rports that it is tight at the thumb and slipping/ rubbling dorsal wrist. Therapsit performed adjustments to splint to enlarge thumb opening and to allow for opening and closing tat thumb portion when donning. Therapist re-fitted splint dorsal portion, for improved fit and enlarged area around unlar styloid.  Gel padding was added at wrist to protect ulnar styloid. Pt reports improved comfort  and fit with modifications.  05/24/23-Pt arrived wearing old splint. Pt was fitted with a new thumb spica splint and educated in wear, care and precautions.    PATIENT EDUCATION: Education details: splint wear,care and precautions. Person educated: Patient Education method: Explanation, Demonstration, Verbal cues, and Handouts Education comprehension: verbalized understanding and returned demonstration  HOME EXERCISE PROGRAM: n/a  GOALS: Goals reviewed with patient? Yes    LONG TERM GOALS: Target date: 08/19/23  I with splint wear care and precautions following 1-2 weeks to ensure proper fit  Goal status:  ongoing- 05/31/23   ASSESSMENT:  CLINICAL IMPRESSION: Patient is progressing towards goals. He reports splint is fitting better and more comfortable following adjustments. PERFORMANCE DEFICITS: in functional skills including ADLs, IADLs, coordination, dexterity, ROM, strength, pain, and flexibility,  and psychosocial skills including coping strategies, environmental adaptation, habits, interpersonal interactions, and routines and behaviors.   IMPAIRMENTS: are limiting patient from ADLs, IADLs, rest and sleep, work, play, leisure, and social participation.   COMORBIDITIES: may have co-morbidities  that affects occupational performance. Patient will benefit from skilled OT to address above impairments and improve overall function.  MODIFICATION OR ASSISTANCE TO COMPLETE EVALUATION: No  modification of tasks or assist necessary to complete an evaluation.  OT OCCUPATIONAL PROFILE AND HISTORY: Detailed assessment: Review of records and additional review of physical, cognitive, psychosocial history related to current functional performance.  CLINICAL DECISION MAKING: LOW - limited treatment options, no task modification necessary  REHAB POTENTIAL: Good  EVALUATION COMPLEXITY: Low      PLAN:  OT FREQUENCY:  4 visits plus eval  OT DURATION: 8 weeks  PLANNED INTERVENTIONS: 97168 OT Re-evaluation, 97535 self care/ADL training, 47829 therapeutic exercise, 97760 Orthotics management and training, 56213 Splinting (initial encounter), [id number] Subsequent splinting/medication, passive range of motion, coping strategies training, patient/family education, and DME and/or AE instructions  RECOMMENDED OTHER SERVICES: n/a  CONSULTED AND AGREED WITH PLAN OF CARE:  Patient  PLAN FOR NEXT SESSION: splint check and modification prn, Therapist to fabricate a second volar thumb spica splint next visit.   Isiaih Hollenbach, OT 05/31/2023, 12:34 PM

## 2023-06-08 ENCOUNTER — Ambulatory Visit: Payer: PPO

## 2023-06-08 ENCOUNTER — Ambulatory Visit
Admission: EM | Admit: 2023-06-08 | Discharge: 2023-06-08 | Disposition: A | Discharge status: Home / Self Care | Payer: PPO | Attending: Family Medicine | Admitting: Family Medicine

## 2023-06-08 ENCOUNTER — Other Ambulatory Visit: Payer: Self-pay

## 2023-06-08 DIAGNOSIS — H60502 Unspecified acute noninfective otitis externa, left ear: Secondary | ICD-10-CM | POA: Diagnosis not present

## 2023-06-08 MED ORDER — OFLOXACIN 0.3 % OT SOLN: 5.0000 [drp] | Freq: Two times a day (BID) | OTIC | 0 refills | Status: AC

## 2023-06-08 NOTE — ED Provider Notes (Signed)
[facility]    CSN: [id number] Arrival date & time: 06/08/23  1024      History   Chief Complaint No chief complaint on file.   HPI Clarence Ware is a 72 y.o. male.   HPI Here for left ear pain x 2 days. No f/c. No cough/congestion. It does hurt to lie down on it. No dc. Wears hearing aids, and he does swiim.  Only allergy is nexium   Past Medical History:  Diagnosis Date   Allergy    First degree heart block    Hyperlipidemia    Hypertension    REM sleep behavior disorder    Substance abuse [facility])     Patient Active Problem List   Diagnosis Date Noted   Right wrist pain 05/17/2023   Pronation deformity of both feet 05/17/2023   Verruca warts (infectious) 04/06/2023   BPH associated with nocturia 04/04/2023   Chronic heart failure with preserved ejection fraction (HFpEF) (HCC) 10/14/2022   Need for vaccination 09/12/2022   Sleep related bruxism 08/24/2022   UARS (upper airway resistance syndrome) 08/24/2022   Primary osteoarthritis of both knees 12/13/2021   Cerebellar ataxia in diseases classified elsewhere (HCC) 09/01/2021   Seborrheic keratosis 09/01/2021   BPH with urinary obstruction 09/01/2021   Age-related cognitive decline 08/05/2020   Uncontrolled REM sleep behavior disorder 03/16/2020   Routine general medical examination at a health care facility 01/18/2017   Essential hypertension 12/18/2014   Hyperlipidemia with target LDL less than 100 12/18/2014   First degree heart block 03/30/2013   Opioid type dependence (HCC) 08/10/2010   Prediabetes 06/30/2010   GERD 06/26/2008   ERECTILE DYSFUNCTION, MILD 01/30/2008   COLON POLYP 09/06/2006    Past Surgical History:  Procedure Laterality Date   INGUINAL HERNIA REPAIR Right 03/10/2015   Procedure: OPEN RIGHT INGUINAL HERNIA REPAIR;  Surgeon: Luretha Murphy, MD;  Location: [facility];  Service: General;  Laterality: Right;  With MESH   VASECTOMY         Home Medications    Prior to  Admission medications   Medication Sig Start Date End Date Taking? Authorizing Provider  alfuzosin (UROXATRAL) 10 MG 24 hr tablet Take 1 tablet (10 mg total) by mouth every morning. 04/06/23  Yes Etta Grandchild, MD  aspirin EC 81 MG tablet Take 81 mg by mouth daily.   Yes [provider]  celecoxib (CELEBREX) 100 MG capsule Take 1 capsule (100 mg total) by mouth daily. 06/09/22  Yes Etta Grandchild, MD  cetirizine (ZYRTEC) 10 MG tablet Take 10 mg by mouth as directed. PRN   Yes [provider]  Cholecalciferol (VITAMIN D) 2000 units CAPS Take 1 capsule by mouth daily.   Yes [provider]  clonazePAM (KLONOPIN) 0.5 MG tablet Take 0.5 tablets (0.25 mg total) by mouth at bedtime as needed for anxiety. 03/21/23  Yes Dohmeier, Porfirio Mylar, MD  docusate sodium (COLACE) 50 MG capsule Take 100 mg by mouth 2 (two) times daily.   Yes [provider]  ezetimibe (ZETIA) 10 MG tablet Take 1 tablet (10 mg total) by mouth daily. 04/06/23  Yes Etta Grandchild, MD  GEMTESA 75 MG TABS Take 1 tablet by mouth daily. 05/25/23  Yes [provider]  JARDIANCE 10 MG TABS tablet Take 1 tablet by mouth once daily before breakfast. 02/02/23  Yes Etta Grandchild, MD  ofloxacin (FLOXIN) 0.3 % OTIC solution Place 5 drops into both ears 2 (two) times daily for 7 days.  06/08/23 06/15/23 Yes Zenia Resides, MD  rosuvastatin (CRESTOR) 40 MG tablet Take 1 tablet (40 mg total) by mouth daily. 05/17/23 08/15/23 Yes Lennette Bihari, MD  melatonin 3 MG TABS tablet Take 2 tablets (6 mg total) by mouth at bedtime. Patient taking differently: Take 3 mg by mouth at bedtime. 11/26/19   Etta Grandchild, MD    Family History Family History  Problem Relation Age of Onset   Heart disease Mother    Stroke Mother    Heart disease Father 76       MI   Hypertension Father    Hypertension Brother    Cancer Brother    Diabetes Brother    Heart disease Brother    Hyperlipidemia Brother    Cancer Brother     Hyperlipidemia Sister    Coronary artery disease Brother 33       CABG   Colon cancer Paternal Grandmother     Social History Social History   Tobacco Use   Smoking status: Former    Current packs/day: 0.00    Average packs/day: 0.3 packs/day for 1 year (0.3 ttl pk-yrs)    Types: Cigarettes    Start date: 07/11/1991    Quit date: 07/10/1992    Years since quitting: 30.9   Smokeless tobacco: Never  Vaping Use   Vaping status: Never Used  Substance Use Topics   Alcohol use: No    Alcohol/week: 0.0 standard drinks of alcohol   Drug use: Not Currently    Comment: sobrity for 10 years     Allergies   Nexium [esomeprazole magnesium]   Review of Systems Review of Systems   Physical Exam Triage Vital Signs ED Triage Vitals  Encounter Vitals Group     BP 06/08/23 1122 (!) 123/57     Systolic BP Percentile --      Diastolic BP Percentile --      Pulse Rate 06/08/23 1122 (!) 54     Resp 06/08/23 1122 16     Temp 06/08/23 1122 97.9 F (36.6 C)     Temp Source 06/08/23 1122 Oral     SpO2 06/08/23 1122 96 %     Weight --      Height --      Head Circumference --      Peak Flow --      Pain Score 06/08/23 1119 5     Pain Loc --      Pain Education --      Exclude from Growth Chart --    No data found.  Updated Vital Signs BP (!) 123/57 (BP Location: Right Arm)   Pulse (!) 54   Temp 97.9 F (36.6 C) (Oral)   Resp 16   SpO2 96%   Visual Acuity Right Eye Distance:   Left Eye Distance:   Bilateral Distance:    Right Eye Near:   Left Eye Near:    Bilateral Near:     Physical Exam Vitals reviewed.  Constitutional:      General: He is not in acute distress.    Appearance: He is not ill-appearing, toxic-appearing or diaphoretic.  HENT:     Right Ear: Tympanic membrane and ear canal normal.     Left Ear: Tympanic membrane normal.     Ears:     Comments: There is some erythema of the floor and anterior wall of the left ear canal. Poss mild  swelling  Skin:    Coloration: Skin is  not pale.  Neurological:     Mental Status: He is alert and oriented to person, place, and time.  Psychiatric:        Behavior: Behavior normal.      UC Treatments / Results  Labs (all labs ordered are listed, but only abnormal results are displayed) Labs Reviewed - No data to display  EKG   Radiology No results found.  Procedures Procedures (including critical care time)  Medications Ordered in UC Medications - No data to display  Initial Impression / Assessment and Plan / UC Course  I have reviewed the triage vital signs and the nursing notes.  Pertinent labs & imaging results that were available during my care of the patient were reviewed by me and considered in my medical decision making (see chart for details).     Floxin drops are sent probable otitis externa.  He will continue ibuprofen as needed for the pain Final Clinical Impressions(s) / UC Diagnoses   Final diagnoses:  Acute otitis externa of left ear, unspecified type     Discharge Instructions      -Floxin eardrops-5 drops in the affected ear 2 times daily for 7 days.       ED Prescriptions     Medication Sig Dispense Auth. Provider   ofloxacin (FLOXIN) 0.3 % OTIC solution Place 5 drops into both ears 2 (two) times daily for 7 days. 5 mL Zenia Resides, MD      PDMP not reviewed this encounter.   Zenia Resides, MD 06/08/23 [id number]

## 2023-06-08 NOTE — Discharge Instructions (Signed)
-  Floxin eardrops-5 drops in the affected ear 2 times daily for 7 days.

## 2023-06-08 NOTE — ED Triage Notes (Signed)
Pt presents to UC w/ c/o left ear ache x2 days. Ibuprofen and tylenol help some.

## 2023-06-18 ENCOUNTER — Ambulatory Visit (INDEPENDENT_AMBULATORY_CARE_PROVIDER_SITE_OTHER): Payer: PPO

## 2023-06-18 VITALS — Ht 74.0 in | Wt 190.0 lb

## 2023-06-18 DIAGNOSIS — Z0000 Encounter for general adult medical examination without abnormal findings: Secondary | ICD-10-CM | POA: Diagnosis not present

## 2023-06-18 NOTE — Patient Instructions (Signed)
Mr. Chiappa , Thank you for taking time to come for your Medicare Wellness Visit. I appreciate your ongoing commitment to your health goals. Please review the following plan we discussed and let me know if I can assist you in the future.   Referrals/Orders/Follow-Ups/Clinician Recommendations: Keep up the good work and hope you have a Doctor, general practice Christmas.  This is a list of the screening recommended for you and due dates:  Health Maintenance  Topic Date Due   COVID-19 Vaccine (5 - 2023-24 season) 07/04/2023*   Medicare Annual Wellness Visit  06/17/2024   Colon Cancer Screening  03/18/2025   DTaP/Tdap/Td vaccine (3 - Td or Tdap) 02/22/2030   Pneumonia Vaccine  Completed   Flu Shot  Completed   Hepatitis C Screening  Completed   Zoster (Shingles) Vaccine  Completed   HPV Vaccine  Aged Out  *Topic was postponed. The date shown is not the original due date.    Advanced directives: (Copy Requested) Please bring a copy of your health care power of attorney and living will to the office to be added to your chart at your convenience.  Next Medicare Annual Wellness Visit scheduled for next year: Yes

## 2023-06-18 NOTE — Progress Notes (Unsigned)
Subjective:   Clarence Ware is a 72 y.o. male who presents for Medicare Annual/Subsequent preventive examination.  Visit Complete: Virtual I connected with  Clarence Ware on 06/18/23 by a audio enabled telemedicine application and verified that I am speaking with the correct person using two identifiers.  Patient Location: Home  Provider Location: Home Office  I discussed the limitations of evaluation and management by telemedicine. The patient expressed understanding and agreed to proceed.  Vital Signs: Because this visit was a virtual/telehealth visit, some criteria may be missing or patient reported. Any vitals not documented were not able to be obtained and vitals that have been documented are patient reported.  Patient Medicare AWV questionnaire was completed by the patient on 06/16/2023; I have confirmed that all information answered by patient is correct and no changes since this date.  Cardiac Risk Factors include: male gender;advanced age (>74men, >10 women);hypertension;Other (see comment), Risk factor comments: CHF, UARS     Objective:    Today's Vitals   06/18/23 1311  Weight: 190 lb (86.2 kg)  Height: 6\' 2"  (1.88 m)   Body mass index is 24.39 kg/m.     06/18/2023    1:15 PM 06/09/2022    9:15 AM 09/21/2021    7:57 AM 06/06/2021    1:57 PM 03/19/2020    8:27 AM 02/23/2020    4:54 PM 03/07/2019    9:03 AM  Advanced Directives  Does Patient Have a Medical Advance Directive? Yes Yes Yes Yes Yes No Yes  Type of Estate agent of [place];Living will Healthcare Power of [place];Living will Healthcare Power of [place];Living will Healthcare Power of [place];Living will   Healthcare Power of [place];Living will  Does patient want to make changes to medical advance directive?   No - Patient declined  No - Patient declined    Copy of Healthcare Power of Attorney in Chart? No - copy requested  No - copy requested No - copy requested   No - copy  requested    Current Medications (verified) Outpatient Encounter Medications as of 06/18/2023  Medication Sig   alfuzosin (UROXATRAL) 10 MG 24 hr tablet Take 1 tablet (10 mg total) by mouth every morning.   aspirin EC 81 MG tablet Take 81 mg by mouth daily.   celecoxib (CELEBREX) 100 MG capsule Take 1 capsule (100 mg total) by mouth daily.   cetirizine (ZYRTEC) 10 MG tablet Take 10 mg by mouth as directed. PRN   Cholecalciferol (VITAMIN D) 2000 units CAPS Take 1 capsule by mouth daily.   clonazePAM (KLONOPIN) 0.5 MG tablet Take 0.5 tablets (0.25 mg total) by mouth at bedtime as needed for anxiety.   docusate sodium (COLACE) 50 MG capsule Take 100 mg by mouth 2 (two) times daily.   ezetimibe (ZETIA) 10 MG tablet Take 1 tablet (10 mg total) by mouth daily.   GEMTESA 75 MG TABS Take 1 tablet by mouth daily.   JARDIANCE 10 MG TABS tablet Take 1 tablet by mouth once daily before breakfast.   melatonin 3 MG TABS tablet Take 2 tablets (6 mg total) by mouth at bedtime. (Patient taking differently: Take 3 mg by mouth at bedtime.)   rosuvastatin (CRESTOR) 40 MG tablet Take 1 tablet (40 mg total) by mouth daily.   No facility-administered encounter medications on file as of 06/18/2023.    Allergies (verified) Nexium [esomeprazole magnesium]   History: Past Medical History:  Diagnosis Date   Allergy    First degree heart block  Hyperlipidemia    Hypertension    REM sleep behavior disorder    Substance abuse [facility])    Past Surgical History:  Procedure Laterality Date   INGUINAL HERNIA REPAIR Right 03/10/2015   Procedure: OPEN RIGHT INGUINAL HERNIA REPAIR;  Surgeon: Luretha Murphy, MD;  Location: [facility];  Service: General;  Laterality: Right;  With MESH   VASECTOMY     Family History  Problem Relation Age of Onset   Heart disease Mother    Stroke Mother    Heart disease Father 83       MI   Hypertension Father    Hypertension Brother    Cancer Brother    Diabetes Brother    Heart  disease Brother    Hyperlipidemia Brother    Cancer Brother    Hyperlipidemia Sister    Coronary artery disease Brother 65       CABG   Colon cancer Paternal Grandmother    Social History   Socioeconomic History   Marital status: Married    Spouse name: Clarence Ware   Number of children: 2   Years of education: Not on file   Highest education level: Bachelor's degree (e.g., BA, AB, BS)  Occupational History   Occupation: Pharmacist part-time  Tobacco Use   Smoking status: Former    Current packs/day: 0.00    Average packs/day: 0.3 packs/day for 1 year (0.3 ttl pk-yrs)    Types: Cigarettes    Start date: 07/11/1991    Quit date: 07/10/1992    Years since quitting: 30.9   Smokeless tobacco: Never  Vaping Use   Vaping status: Never Used  Substance and Sexual Activity   Alcohol use: No    Alcohol/week: 0.0 standard drinks of alcohol   Drug use: Not Currently    Comment: sobrity for 10 years   Sexual activity: Yes  Other Topics Concern   Not on file  Social History Narrative   Married. Education: [employer].   Exercise: some   Caffeine- coffee 2 c daily      Lives with wife.   Social Determinants of Health   Financial Resource Strain: Low Risk  (06/16/2023)   Overall Financial Resource Strain (CARDIA)    Difficulty of Paying Living Expenses: Not hard at all  Food Insecurity: No Food Insecurity (06/16/2023)   Hunger Vital Sign    Worried About Running Out of Food in the Last Year: Never true    Ran Out of Food in the Last Year: Never true  Transportation Needs: No Transportation Needs (06/16/2023)   PRAPARE - Administrator, Civil Service (Medical): No    Lack of Transportation (Non-Medical): No  Physical Activity: Sufficiently Active (06/16/2023)   Exercise Vital Sign    Days of Exercise per Week: 6 days    Minutes of Exercise per Session: 40 min  Stress: No Stress Concern Present (06/16/2023)   [employer] of Occupational Health - Occupational Stress  Questionnaire    Feeling of Stress : Not at all  Social Connections: Socially Integrated (06/16/2023)   Social Connection and Isolation Panel [NHANES]    Frequency of Communication with Friends and Family: More than three times a week    Frequency of Social Gatherings with Friends and Family: More than three times a week    Attends Religious Services: More than 4 times per year    Active Member of [employer] or Organizations: Yes    Attends Banker Meetings: More than 4 times per year  Marital Status: Married    Tobacco Counseling Counseling given: Not Answered   Clinical Intake:  Pre-visit preparation completed: Yes  Pain : No/denies pain     BMI - recorded: 24.39 Nutritional Status: BMI of 19-24  Normal Nutritional Risks: None Diabetes: No  How often do you need to have someone help you when you read instructions, pamphlets, or other written materials from your doctor or pharmacy?: 1 - Never  Interpreter Needed?: No  Information entered by :: Caylea Foronda, RMA   Activities of Daily Living    06/16/2023   11:06 PM  In your present state of health, do you have any difficulty performing the following activities:  Hearing? 0  Vision? 0  Difficulty concentrating or making decisions? 0  Walking or climbing stairs? 0  Dressing or bathing? 0  Doing errands, shopping? 0  Preparing Food and eating ? N  Using the Toilet? N  In the past six months, have you accidently leaked urine? Y  Do you have problems with loss of bowel control? N  Managing your Medications? N  Managing your Finances? N  Housekeeping or managing your Housekeeping? N    Patient Care Team: Etta Grandchild, MD as PCP - General (Internal Medicine) Lennette Bihari, MD as PCP - Cardiology (Cardiology) Marcine Matar, MD as Consulting Physician (Urology) Antony Contras, MD as Consulting Physician (Ophthalmology) Hilarie Fredrickson, MD as Consulting Physician (Gastroenterology) Kathyrn Sheriff, [facility] (Inactive) as Pharmacist (Pharmacist)  Indicate any recent Medical Services you may have received from other than [facility] providers in the past year (date may be approximate).     Assessment:   This is a routine wellness examination for Maude.  Hearing/Vision screen Hearing Screening - Comments:: Wears hearing aides Vision Screening - Comments:: No vision issues   Goals Addressed               This Visit's Progress     Patient Stated (pt-stated)        Not at this time.       Depression Screen    06/18/2023    1:18 PM 09/12/2022   11:14 AM 06/09/2022    9:20 AM 06/09/2022    9:16 AM 01/03/2022   10:36 AM 06/06/2021    2:01 PM 06/06/2021    1:56 PM  PHQ 2/9 Scores  PHQ - 2 Score 0 0 0 0 0 0 0  PHQ- 9 Score 0          Fall Risk    06/16/2023   11:06 PM 09/12/2022   11:14 AM 06/09/2022    9:16 AM 06/05/2022    2:24 PM 01/03/2022   10:36 AM  Fall Risk   Falls in the past year? 1 0 1 0 1  Number falls in past yr: 1 0 0  0  Injury with Fall? 0 0 1  1  Risk for fall due to :  No Fall Risks No Fall Risks  No Fall Risks  Follow up Falls evaluation completed;Falls prevention discussed Falls evaluation completed Falls evaluation completed  Falls evaluation completed    MEDICARE RISK AT HOME: Medicare Risk at Home Any stairs in or around the home?: Yes If so, are there any without handrails?: No Home free of loose throw rugs in walkways, pet beds, electrical cords, etc?: Yes Adequate lighting in your home to reduce risk of falls?: Yes Life alert?: No Use of a cane, walker or w/c?: No Grab bars in the bathroom?: Yes  Shower chair or bench in shower?: Yes Elevated toilet seat or a handicapped toilet?: Yes  TIMED UP AND GO:  Was the test performed?  No    Cognitive Function:      03/21/2023    9:27 AM 03/20/2022    8:26 AM  Montreal Cognitive Assessment   Visuospatial/ Executive (0/5) 4 5  Naming (0/3) 3 3  Attention: Read list of digits (0/2) 2 2   Attention: Read list of letters (0/1) 1 1  Attention: Serial 7 subtraction starting at 100 (0/3) 3 3  Language: Repeat phrase (0/2) 2 2  Language : Fluency (0/1) 1 1  Abstraction (0/2) 2 2  Delayed Recall (0/5) 5 3  Orientation (0/6) 6 6  Total 29 28      06/18/2023    1:16 PM 06/09/2022    9:24 AM  6CIT Screen  What Year? 0 points 0 points  What month? 0 points 0 points  What time? 0 points 0 points  Count back from 20 0 points 0 points  Months in reverse 0 points 0 points  Repeat phrase 0 points 0 points  Total Score 0 points 0 points    Immunizations Immunization History  Administered Date(s) Administered   Fluad Quad(high Dose 65+) 04/06/2022   Hepatitis B 06/09/2013   Influenza, High Dose Seasonal PF 05/02/2019   Influenza-Unspecified 04/09/2014, 04/27/2018, 05/02/2019, 05/06/2021, 04/05/2023   Moderna SARS-COV2 Booster Vaccination 05/06/2020, 11/07/2020   Moderna Sars-Covid-2 Vaccination 07/08/2019, 08/05/2019   PFIZER(Purple Top)SARS-COV-2 Vaccination 03/29/2021   Pneumococcal Conjugate-13 01/13/2016   Pneumococcal Polysaccharide-23 01/18/2017, 09/12/2022   Tdap 10/03/2011, 02/23/2020   Unspecified SARS-COV-2 Vaccination 04/06/2022   Zoster Recombinant(Shingrix) 03/02/2018, 05/19/2018   Zoster, Live 06/09/2013    TDAP status: Up to date  Flu Vaccine status: Up to date  Pneumococcal vaccine status: Up to date  Covid-19 vaccine status: Completed vaccines  Qualifies for Shingles Vaccine? Yes   Zostavax completed Yes   Shingrix Completed?: Yes  Screening Tests Health Maintenance  Topic Date Due   COVID-19 Vaccine (5 - 2023-24 season) 07/04/2023 (Originally 03/11/2023)   Medicare Annual Wellness (AWV)  06/17/2024   Colonoscopy  03/18/2025   DTaP/Tdap/Td (3 - Td or Tdap) 02/22/2030   Pneumonia Vaccine 35+ Years old  Completed   INFLUENZA VACCINE  Completed   Hepatitis C Screening  Completed   Zoster Vaccines- Shingrix  Completed   HPV VACCINES  Aged Out     Health Maintenance  There are no preventive care reminders to display for this patient.   Colorectal cancer screening: Type of screening: Colonoscopy. Completed 03/18/2025. Repeat every 10 years  Lung Cancer Screening: (Low Dose CT Chest recommended if Age 22-80 years, 20 pack-year currently smoking OR have quit w/in 15years.) does not qualify.   Lung Cancer Screening Referral: N/A  Additional Screening:  Hepatitis C Screening: does qualify; Completed 01/13/2016  Vision Screening: Recommended annual ophthalmology exams for early detection of glaucoma and other disorders of the eye. Is the patient up to date with their annual eye exam?  Yes  Who is the provider or what is the name of the office in which the patient attends annual eye exams? Dr. Randon Goldsmith If pt is not established with a provider, would they like to be referred to a provider to establish care? No .   Dental Screening: Recommended annual dental exams for proper oral hygiene   Community Resource Referral / Chronic Care Management: CRR required this visit?  No   CCM required this visit?  No     Plan:     I have personally reviewed and noted the following in the patient's chart:   Medical and social history Use of alcohol, tobacco or illicit drugs  Current medications and supplements including opioid prescriptions. Patient is not currently taking opioid prescriptions. Functional ability and status Nutritional status Physical activity Advanced directives List of other physicians Hospitalizations, surgeries, and ER visits in previous 12 months Vitals Screenings to include cognitive, depression, and falls Referrals and appointments  In addition, I have reviewed and discussed with patient certain preventive protocols, quality metrics, and best practice recommendations. A written personalized care plan for preventive services as well as general preventive health recommendations were provided to patient.      Rorik Vespa L Rolanda Campa, CMA   06/18/2023   After Visit Summary: (MyChart) Due to this being a telephonic visit, the after visit summary with patients personalized plan was offered to patient via MyChart   Nurse Notes: Patient is up to date on all health maintenance.  He had no concerns to address today.

## 2023-06-20 ENCOUNTER — Ambulatory Visit: Payer: PPO | Attending: Family Medicine | Admitting: Occupational Therapy

## 2023-06-20 DIAGNOSIS — M25541 Pain in joints of right hand: Secondary | ICD-10-CM | POA: Insufficient documentation

## 2023-06-20 DIAGNOSIS — M25531 Pain in right wrist: Secondary | ICD-10-CM | POA: Insufficient documentation

## 2023-06-20 NOTE — Therapy (Signed)
OUTPATIENT OCCUPATIONAL THERAPY ORTHO Treatment  Patient Name: Clarence Ware MRN: [id number] DOB:12-Sep-1950, 72 y.o., male Today's Date: 06/20/2023  PCP: Dr. Yetta Barre REFERRING PROVIDER: Dr. Katrinka Blazing  END OF SESSION:  OT End of Session - 06/20/23 1232     Visit Number 3    Number of Visits 5    Date for OT Re-Evaluation 07/19/23    Authorization - Visit Number 3    Progress Note Due on Visit 10    OT Start Time 1232    OT Stop Time 1334    OT Time Calculation (min) 62 min               Past Medical History:  Diagnosis Date   Allergy    First degree heart block    Hyperlipidemia    Hypertension    REM sleep behavior disorder    Substance abuse (HCC)    Past Surgical History:  Procedure Laterality Date   INGUINAL HERNIA REPAIR Right 03/10/2015   Procedure: OPEN RIGHT INGUINAL HERNIA REPAIR;  Surgeon: Luretha Murphy, MD;  Location: [facility];  Service: General;  Laterality: Right;  With MESH   VASECTOMY     Patient Active Problem List   Diagnosis Date Noted   Right wrist pain 05/17/2023   Pronation deformity of both feet 05/17/2023   Verruca warts (infectious) 04/06/2023   BPH associated with nocturia 04/04/2023   Chronic heart failure with preserved ejection fraction (HFpEF) (HCC) 10/14/2022   Need for vaccination 09/12/2022   Sleep related bruxism 08/24/2022   UARS (upper airway resistance syndrome) 08/24/2022   Primary osteoarthritis of both knees 12/13/2021   Cerebellar ataxia in diseases classified elsewhere (HCC) 09/01/2021   Seborrheic keratosis 09/01/2021   BPH with urinary obstruction 09/01/2021   Age-related cognitive decline 08/05/2020   Uncontrolled REM sleep behavior disorder 03/16/2020   Routine general medical examination at a health care facility 01/18/2017   Essential hypertension 12/18/2014   Hyperlipidemia with target LDL less than 100 12/18/2014   First degree heart block 03/30/2013   Opioid type dependence (HCC) 08/10/2010   Prediabetes  06/30/2010   GERD 06/26/2008   ERECTILE DYSFUNCTION, MILD 01/30/2008   COLON POLYP 09/06/2006    ONSET DATE: 05/17/23  REFERRING DIAG: R wrist pain  THERAPY DIAG:  Pain in right wrist  Pain in thumb joint with movement of right hand  Rationale for Evaluation and Treatment: Rehabilitation  SUBJECTIVE:   SUBJECTIVE STATEMENT: Pt returns to have an additional radial thumb spica splint fabricated Pt accompanied by: self  PERTINENT HISTORY: Per Dr. 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  Patient has done well with the bracing but does seem that the brace is greater than 31 years old and causing more discomfort at the moment.   PRECAUTIONS: None   PAIN: PAIN:  Are you having pain? Yes: NPRS scale: 3/10 Pain location: right wrist and thumb Pain description: aching Aggravating factors: splinting wod without brace Relieving factors: brace      FALLS: Has patient fallen in last 6 months? No  LIVING ENVIRONMENT: Lives with: lives with their family and lives with their spouse Lives in: House/apartment   PLOF: Independent  PATIENT GOALS: new wrist brace  NEXT MD VISIT: prn  OBJECTIVE:  Note: Objective measures were completed at Evaluation unless otherwise noted.  HAND DOMINANCE: Right  ADLs:I with all basic ADLs, pt reports he wears his brace for  activities requiring repetition and lifting for pain management   FUNCTIONAL OUTCOME MEASURES: Quick Dash: 1.09% disability  UPPER EXTREMITY ROM:   NT     SENSATION: Not tested  EDEMA: no edema noted  COGNITION: Overall cognitive status: Within functional limits for tasks assessed   OBSERVATIONS: Pleasant male, familaiar to this therapist from previous OT in 2020   TODAY'S TREATMENT:                                                                                                                               DATE: 06/20/23- Pt arrived with previous splints. Therapist fabricated a new splint that is more radial like his orginal splint. Therapist fabricated a new radial thumb spica splint. Splint appears to fit well and protect thumb and wrist. Pt verbalizes understanding of splint wear and will call therapist if he needs adjustments. 05/31/23- Pt arrived with the splint fabricated last visit. He reports that it is tight at the thumb and slipping/ rubbling dorsal wrist. Therapsit performed adjustments to splint to enlarge thumb opening and to allow for opening and closing tat thumb portion when donning. Therapist re-fitted splint dorsal portion, for improved fit and enlarged area around unlar styloid.  Gel padding was added at wrist to protect ulnar styloid. Pt reports improved comfort  and fit with modifications.  05/24/23-Pt arrived wearing old splint. Pt was fitted with a new thumb spica splint and educated in wear, care and precautions.    PATIENT EDUCATION: Education details: splint wear,care and precautions, pt to call if splint needs adjustments Person educated: Patient Education method: Explanation, Demonstration, Verbal cues,  Education comprehension: verbalized understanding and returned demonstration  HOME EXERCISE PROGRAM: n/a  GOALS: Goals reviewed with patient? Yes    LONG TERM GOALS: Target date: 08/19/23  I with splint wear care and precautions following 1-2 weeks to ensure proper fit  Goal status:  ongoing- 05/31/23   ASSESSMENT:  CLINICAL IMPRESSION: Patient is progressing towards goals. Pt demonstrates understanding of splint wear and care. Pt's updated splint appears to provide good positioning. Pt has not been wearing his previously issued splint as he has been out of town. Pt will call if he needs splint adjustments. PERFORMANCE DEFICITS: in functional skills including ADLs, IADLs, coordination, dexterity, ROM, strength, pain, and flexibility,  and psychosocial skills  including coping strategies, environmental adaptation, habits, interpersonal interactions, and routines and behaviors.   IMPAIRMENTS: are limiting patient from ADLs, IADLs, rest and sleep, work, play, leisure, and social participation.   COMORBIDITIES: may have co-morbidities  that affects occupational performance. Patient will benefit from skilled OT to address above impairments and improve overall function.  MODIFICATION OR ASSISTANCE TO COMPLETE EVALUATION: No modification of tasks or assist necessary to complete an evaluation.  OT OCCUPATIONAL PROFILE AND HISTORY: Detailed assessment: Review of records and additional review of physical, cognitive, psychosocial history related to current functional performance.  CLINICAL DECISION MAKING: LOW - limited treatment options, no task modification  necessary  REHAB POTENTIAL: Good  EVALUATION COMPLEXITY: Low      PLAN:  OT FREQUENCY:  4 visits plus eval  OT DURATION: 8 weeks  PLANNED INTERVENTIONS: 97168 OT Re-evaluation, 97535 self care/ADL training, 16109 therapeutic exercise, 97760 Orthotics management and training, 60454 Splinting (initial encounter), [phone] Subsequent splinting/medication, passive range of motion, coping strategies training, patient/family education, and DME and/or AE instructions  RECOMMENDED OTHER SERVICES: n/a  CONSULTED AND AGREED WITH PLAN OF CARE: Patient  PLAN FOR NEXT SESSION: splint check and modification prn,  pt to call for adjustments   Pamelia Botto, OT 06/20/2023, 1:42 PM

## 2023-07-17 ENCOUNTER — Encounter: Payer: Self-pay | Admitting: Cardiovascular Disease

## 2023-07-24 LAB — HM DIABETES EYE EXAM

## 2023-08-08 ENCOUNTER — Other Ambulatory Visit: Payer: Self-pay | Admitting: Medical Genetics

## 2023-08-09 ENCOUNTER — Other Ambulatory Visit (HOSPITAL_COMMUNITY)
Admission: RE | Admit: 2023-08-09 | Discharge: 2023-08-09 | Disposition: A | Discharge status: Home / Self Care | Payer: Self-pay | Source: Ambulatory Visit | Attending: Oncology | Admitting: Oncology

## 2023-08-13 DIAGNOSIS — H524 Presbyopia: Secondary | ICD-10-CM | POA: Diagnosis not present

## 2023-08-13 DIAGNOSIS — Z961 Presence of intraocular lens: Secondary | ICD-10-CM | POA: Diagnosis not present

## 2023-08-13 DIAGNOSIS — H35373 Puckering of macula, bilateral: Secondary | ICD-10-CM | POA: Diagnosis not present

## 2023-08-13 DIAGNOSIS — H52203 Unspecified astigmatism, bilateral: Secondary | ICD-10-CM | POA: Diagnosis not present

## 2023-08-20 LAB — GENECONNECT MOLECULAR SCREEN: Genetic Analysis Overall Interpretation: NEGATIVE

## 2023-09-11 NOTE — Progress Notes (Unsigned)
   Rubin Payor, PhD, LAT, ATC acting as a scribe for Clementeen Graham, MD.  Clarence Ware is a 73 y.o. male who presents to [facility] today for right knee pain.  Patient was previously seen by Dr. Katrinka Blazing on 05/17/2023 for bilateral wrist pain.  Today, patient c/o right knee pain x ***.  Patient locates pain to***  R Knee swelling: Mechanical symptoms: Aggravates: Treatments tried:  Pertinent review of systems: ***  Relevant historical information: ***   Exam:  There were no vitals taken for this visit. General: Well Developed, well nourished, and in no acute distress.   MSK: ***    Lab and Radiology Results No results found for this or any previous visit (from the past 72 hours). No results found.     Assessment and Plan: 73 y.o. male with ***   PDMP not reviewed this encounter. No orders of the defined types were placed in this encounter.  No orders of the defined types were placed in this encounter.    Discussed warning signs or symptoms. Please see discharge instructions. Patient expresses understanding.   ***

## 2023-09-12 ENCOUNTER — Other Ambulatory Visit: Payer: Self-pay

## 2023-09-12 ENCOUNTER — Ambulatory Visit (INDEPENDENT_AMBULATORY_CARE_PROVIDER_SITE_OTHER)

## 2023-09-12 ENCOUNTER — Encounter: Payer: Self-pay | Admitting: Family Medicine

## 2023-09-12 ENCOUNTER — Other Ambulatory Visit: Payer: Self-pay | Admitting: Internal Medicine

## 2023-09-12 ENCOUNTER — Ambulatory Visit: Payer: PPO | Admitting: Family Medicine

## 2023-09-12 VITALS — BP 164/92 | HR 53 | Ht 74.0 in | Wt 193.0 lb

## 2023-09-12 DIAGNOSIS — M25561 Pain in right knee: Secondary | ICD-10-CM | POA: Diagnosis not present

## 2023-09-12 DIAGNOSIS — M170 Bilateral primary osteoarthritis of knee: Secondary | ICD-10-CM | POA: Diagnosis not present

## 2023-09-12 DIAGNOSIS — G8929 Other chronic pain: Secondary | ICD-10-CM

## 2023-09-12 DIAGNOSIS — R7303 Prediabetes: Secondary | ICD-10-CM

## 2023-09-12 DIAGNOSIS — M7989 Other specified soft tissue disorders: Secondary | ICD-10-CM | POA: Diagnosis not present

## 2023-09-12 DIAGNOSIS — I5032 Chronic diastolic (congestive) heart failure: Secondary | ICD-10-CM

## 2023-09-12 MED ORDER — CELECOXIB 100 MG PO CAPS: 100.0000 mg | ORAL_CAPSULE | Freq: Two times a day (BID) | ORAL | 1 refills | Status: DC | PRN

## 2023-09-12 NOTE — Patient Instructions (Addendum)
 Thank you for coming in today.   Please get an Xray today before you leave   Please use Voltaren gel (Generic Diclofenac Gel) up to 4x daily for pain as needed.  This is available over-the-counter as both the name brand Voltaren gel and the generic diclofenac gel.   I recommend you obtained a compression sleeve to help with your joint problems. There are many options on the market however I recommend obtaining a Full Knee Body Helix compression sleeve.  You can find information (including how to appropriate measure yourself for sizing) can be found at www.[email or web address].ch.  Many of these products are health savings account (HSA) eligible.   You can use the compression sleeve at any time throughout the day but is most important to use while being active as well as for 2 hours post-activity.   It is appropriate to ice following activity with the compression sleeve in place.   You received an injection today. Seek immediate medical attention if the joint becomes red, extremely painful, or is oozing fluid.

## 2023-09-24 ENCOUNTER — Other Ambulatory Visit: Payer: Self-pay | Admitting: Internal Medicine

## 2023-09-24 DIAGNOSIS — I5032 Chronic diastolic (congestive) heart failure: Secondary | ICD-10-CM

## 2023-09-24 DIAGNOSIS — R7303 Prediabetes: Secondary | ICD-10-CM

## 2023-09-25 MED ORDER — EMPAGLIFLOZIN 10 MG PO TABS
10.0000 mg | ORAL_TABLET | Freq: Every day | ORAL | 0 refills | Status: DC
Start: 2023-09-25 — End: 2024-01-02

## 2023-10-01 ENCOUNTER — Encounter: Payer: Self-pay | Admitting: Family Medicine

## 2023-10-01 DIAGNOSIS — E785 Hyperlipidemia, unspecified: Secondary | ICD-10-CM | POA: Diagnosis not present

## 2023-10-01 DIAGNOSIS — I10 Essential (primary) hypertension: Secondary | ICD-10-CM | POA: Diagnosis not present

## 2023-10-01 NOTE — Progress Notes (Signed)
 Right knee x-ray looks normal to radiology.  No significant arthritis is visible.  You do have some swelling at the front of the knee visible on the x-ray.

## 2023-10-02 ENCOUNTER — Encounter: Payer: Self-pay | Admitting: Internal Medicine

## 2023-10-02 ENCOUNTER — Ambulatory Visit (INDEPENDENT_AMBULATORY_CARE_PROVIDER_SITE_OTHER): Payer: PPO | Admitting: Internal Medicine

## 2023-10-02 VITALS — BP 122/72 | HR 60 | Temp 97.9°F | Ht 74.0 in | Wt 191.4 lb

## 2023-10-02 DIAGNOSIS — K219 Gastro-esophageal reflux disease without esophagitis: Secondary | ICD-10-CM

## 2023-10-02 DIAGNOSIS — R7303 Prediabetes: Secondary | ICD-10-CM | POA: Diagnosis not present

## 2023-10-02 DIAGNOSIS — E785 Hyperlipidemia, unspecified: Secondary | ICD-10-CM

## 2023-10-02 DIAGNOSIS — I10 Essential (primary) hypertension: Secondary | ICD-10-CM | POA: Diagnosis not present

## 2023-10-02 LAB — TSH: TSH: 2.31 u[IU]/mL (ref 0.35–5.50)

## 2023-10-02 LAB — URINALYSIS, ROUTINE W REFLEX MICROSCOPIC
Bilirubin Urine: NEGATIVE
Hgb urine dipstick: NEGATIVE
Ketones, ur: NEGATIVE
Leukocytes,Ua: NEGATIVE
Nitrite: NEGATIVE
Specific Gravity, Urine: 1.01 (ref 1.000–1.030)
Total Protein, Urine: NEGATIVE
Urine Glucose: >=1000 — AB
Urobilinogen, UA: 0.2 (ref 0.0–1.0)
pH: 6 (ref 5.0–8.0)

## 2023-10-02 LAB — CBC WITH DIFFERENTIAL/PLATELET
Basophils Absolute: 0 10*3/uL (ref 0.0–0.1)
Basophils Relative: 0.4 % (ref 0.0–3.0)
Eosinophils Absolute: 0.5 10*3/uL (ref 0.0–0.7)
Eosinophils Relative: 7.9 % — ABNORMAL HIGH (ref 0.0–5.0)
HCT: 45.3 % (ref 39.0–52.0)
Hemoglobin: 15 g/dL (ref 13.0–17.0)
Lymphocytes Relative: 20.2 % (ref 12.0–46.0)
Lymphs Abs: 1.4 10*3/uL (ref 0.7–4.0)
MCHC: 33.2 g/dL (ref 30.0–36.0)
MCV: 93.3 fl (ref 78.0–100.0)
Monocytes Absolute: 1 10*3/uL (ref 0.1–1.0)
Monocytes Relative: 14 % — ABNORMAL HIGH (ref 3.0–12.0)
Neutro Abs: 3.9 10*3/uL (ref 1.4–7.7)
Neutrophils Relative %: 57.5 % (ref 43.0–77.0)
Platelets: 199 10*3/uL (ref 150.0–400.0)
RBC: 4.85 Mil/uL (ref 4.22–5.81)
RDW: 13.3 % (ref 11.5–15.5)
WBC: 6.9 10*3/uL (ref 4.0–10.5)

## 2023-10-02 LAB — COMPREHENSIVE METABOLIC PANEL
ALT: 27 IU/L (ref 0–44)
AST: 24 IU/L (ref 0–40)
Albumin: 4.4 g/dL (ref 3.8–4.8)
Alkaline Phosphatase: 95 IU/L (ref 44–121)
BUN/Creatinine Ratio: 26 — ABNORMAL HIGH (ref 10–24)
BUN: 27 mg/dL (ref 8–27)
Bilirubin Total: 0.5 mg/dL (ref 0.0–1.2)
CO2: 22 mmol/L (ref 20–29)
Calcium: 9.5 mg/dL (ref 8.6–10.2)
Chloride: 103 mmol/L (ref 96–106)
Creatinine, Ser: 1.02 mg/dL (ref 0.76–1.27)
Globulin, Total: 2.3 g/dL (ref 1.5–4.5)
Glucose: 101 mg/dL — ABNORMAL HIGH (ref 70–99)
Potassium: 4.9 mmol/L (ref 3.5–5.2)
Sodium: 141 mmol/L (ref 134–144)
Total Protein: 6.7 g/dL (ref 6.0–8.5)
eGFR: 78 mL/min/{1.73_m2} (ref >59)

## 2023-10-02 LAB — LIPID PANEL
Chol/HDL Ratio: 2.4 ratio (ref 0.0–5.0)
Cholesterol, Total: 126 mg/dL (ref 100–199)
HDL: 52 mg/dL (ref >39)
LDL Chol Calc (NIH): 62 mg/dL (ref 0–99)
Triglycerides: 55 mg/dL (ref 0–149)
VLDL Cholesterol Cal: 12 mg/dL (ref 5–40)

## 2023-10-02 LAB — HEMOGLOBIN A1C: Hgb A1c MFr Bld: 6.4 % (ref 4.6–6.5)

## 2023-10-02 NOTE — Progress Notes (Unsigned)
 Subjective:  Patient ID: Clarence Ware, male    DOB: 06-27-1951  Age: 73 y.o. MRN: [id number]  CC: Hyperlipidemia, Hypertension, and Gastroesophageal Reflux   HPI Clarence Ware presents for f/up ----  Discussed the use of AI scribe software for clinical note transcription with the patient, who gave verbal consent to proceed.  History of Present Illness   Clarence Culley "Raiford Noble" is a 73 year old male who presents with dizziness and lightheadedness.  He experiences frequent dizziness, described as lightheadedness, which he believes has worsened since starting Jardiance. He attempts to stay hydrated, suspecting dehydration may contribute to his symptoms. Two weeks ago, his blood pressure was recorded at 164/92, but today it is 118/76. No chest pain or shortness of breath during physical activity. No symptoms of high blood pressure or high blood sugar, such as excessive thirst, excessive urination, or drastic changes in weight or appetite.  He was diagnosed with prediabetes last fall with an A1c of 6.2% and is concerned about potential changes in his A1c since starting Jardiance.  He is currently taking rosuvastatin, which has not significantly changed his LDL levels despite a dose adjustment. He recalls a similar experience with Lipitor in the past.       Outpatient Medications Prior to Visit  Medication Sig Dispense Refill   alfuzosin (UROXATRAL) 10 MG 24 hr tablet Take 1 tablet (10 mg total) by mouth every morning. 90 tablet 1   aspirin EC 81 MG tablet Take 81 mg by mouth daily.     celecoxib (CELEBREX) 100 MG capsule Take 1 capsule (100 mg total) by mouth 2 (two) times daily as needed. 90 capsule 1   cetirizine (ZYRTEC) 10 MG tablet Take 10 mg by mouth as directed. PRN     Cholecalciferol (VITAMIN D) 2000 units CAPS Take 1 capsule by mouth daily.     clonazePAM (KLONOPIN) 0.5 MG tablet Take 0.5 tablets (0.25 mg total) by mouth at bedtime as needed for anxiety. 45 tablet 2    docusate sodium (COLACE) 50 MG capsule Take 100 mg by mouth daily as needed for mild constipation or moderate constipation.     empagliflozin (JARDIANCE) 10 MG TABS tablet Take 1 tablet (10 mg total) by mouth daily before breakfast. 60 tablet 0   ezetimibe (ZETIA) 10 MG tablet Take 1 tablet (10 mg total) by mouth daily. 90 tablet 1   melatonin 3 MG TABS tablet Take 2 tablets (6 mg total) by mouth at bedtime. (Patient taking differently: Take 3 mg by mouth at bedtime.) 180 tablet 1   MYRBETRIQ 50 MG TB24 tablet Take 50 mg by mouth daily.     rosuvastatin (CRESTOR) 40 MG tablet Take 1 tablet (40 mg total) by mouth daily. 90 tablet 3   GEMTESA 75 MG TABS Take 1 tablet by mouth daily.     No facility-administered medications prior to visit.    ROS Review of Systems  Constitutional: Negative.  Negative for appetite change, chills, diaphoresis and fatigue.  HENT: Negative.    Eyes: Negative.   Respiratory:  Negative for cough, chest tightness, shortness of breath and wheezing.   Cardiovascular:  Negative for chest pain, palpitations and leg swelling.  Gastrointestinal: Negative.  Negative for abdominal pain, constipation, diarrhea, nausea and vomiting.  Endocrine: Negative.   Genitourinary:  Negative for difficulty urinating.  Musculoskeletal: Negative.  Negative for myalgias.  Skin: Negative.   Neurological:  Negative for dizziness, weakness and light-headedness.  Hematological:  Negative for adenopathy. Does not bruise/bleed easily.  Objective:  BP 122/72 (BP Location: Right Arm, Patient Position: Sitting, Cuff Size: Normal)   Pulse 60   Temp 97.9 F (36.6 C) (Oral)   Ht 6\' 2"  (1.88 m)   Wt 191 lb 7.2 oz (86.8 kg)   SpO2 97%   BMI 24.58 kg/m   BP Readings from Last 3 Encounters:  10/02/23 122/72  09/12/23 (!) 164/92  05/21/23 122/70    Wt Readings from Last 3 Encounters:  10/02/23 191 lb 7.2 oz (86.8 kg)  09/12/23 193 lb (87.5 kg)  06/18/23 190 lb (86.2 kg)    Physical  Exam Vitals reviewed.  Constitutional:      Appearance: Normal appearance.  HENT:     Nose: Nose normal.     Mouth/Throat:     Mouth: Mucous membranes are moist.  Eyes:     General: No scleral icterus.    Conjunctiva/sclera: Conjunctivae normal.  Cardiovascular:     Rate and Rhythm: Normal rate and regular rhythm.     Heart sounds: No murmur heard.    No friction rub. No gallop.  Pulmonary:     Effort: Pulmonary effort is normal.     Breath sounds: No stridor. No wheezing, rhonchi or rales.  Abdominal:     General: Abdomen is flat.     Palpations: There is no mass.     Tenderness: There is no abdominal tenderness. There is no guarding.     Hernia: No hernia is present.  Musculoskeletal:        General: Normal range of motion.     Cervical back: Neck supple.     Right lower leg: No edema.     Left lower leg: No edema.  Lymphadenopathy:     Cervical: No cervical adenopathy.  Skin:    General: Skin is warm and dry.  Neurological:     General: No focal deficit present.     Mental Status: He is alert. Mental status is at baseline.  Psychiatric:        Mood and Affect: Mood normal.        Behavior: Behavior normal.     Lab Results  Component Value Date   WBC 6.9 10/02/2023   HGB 15.0 10/02/2023   HCT 45.3 10/02/2023   PLT 199.0 10/02/2023   GLUCOSE 101 (H) 10/01/2023   CHOL 126 10/01/2023   TRIG 55 10/01/2023   HDL 52 10/01/2023   LDLCALC 62 10/01/2023   ALT 27 10/01/2023   AST 24 10/01/2023   NA 141 10/01/2023   K 4.9 10/01/2023   CL 103 10/01/2023   CREATININE 1.02 10/01/2023   BUN 27 10/01/2023   CO2 22 10/01/2023   TSH 2.31 10/02/2023   PSA 2.79 04/04/2023   HGBA1C 6.4 10/02/2023    No results found.  Assessment & Plan:   Hyperlipidemia with target LDL less than 100- LDL goal achieved. Doing well on the statin  -     TSH; Future  Essential hypertension- His BP is well controlled. -     TSH; Future -     CBC with Differential/Platelet; Future -      Urinalysis, Routine w reflex microscopic; Future  Prediabetes- A1C is up to 6.4%. -     Hemoglobin A1c; Future  Gastroesophageal reflux disease without esophagitis -     CBC with Differential/Platelet; Future     Follow-up: Return in about 6 months (around 04/03/2024).  Sanda Linger, MD

## 2023-10-02 NOTE — Patient Instructions (Signed)

## 2023-10-11 ENCOUNTER — Ambulatory Visit: Payer: PPO | Attending: Cardiovascular Disease | Admitting: Cardiovascular Disease

## 2023-10-11 ENCOUNTER — Other Ambulatory Visit: Payer: Self-pay | Admitting: Internal Medicine

## 2023-10-11 ENCOUNTER — Other Ambulatory Visit (HOSPITAL_COMMUNITY): Payer: Self-pay

## 2023-10-11 ENCOUNTER — Encounter: Payer: Self-pay | Admitting: Cardiovascular Disease

## 2023-10-11 ENCOUNTER — Telehealth: Payer: Self-pay

## 2023-10-11 ENCOUNTER — Telehealth: Payer: Self-pay | Admitting: Pharmacist

## 2023-10-11 VITALS — BP 120/78 | HR 67 | Ht 74.5 in | Wt 195.4 lb

## 2023-10-11 DIAGNOSIS — E7841 Elevated Lipoprotein(a): Secondary | ICD-10-CM | POA: Diagnosis not present

## 2023-10-11 DIAGNOSIS — N401 Enlarged prostate with lower urinary tract symptoms: Secondary | ICD-10-CM

## 2023-10-11 DIAGNOSIS — G4752 REM sleep behavior disorder: Secondary | ICD-10-CM | POA: Diagnosis not present

## 2023-10-11 DIAGNOSIS — I10 Essential (primary) hypertension: Secondary | ICD-10-CM | POA: Diagnosis not present

## 2023-10-11 DIAGNOSIS — E785 Hyperlipidemia, unspecified: Secondary | ICD-10-CM

## 2023-10-11 DIAGNOSIS — I340 Nonrheumatic mitral (valve) insufficiency: Secondary | ICD-10-CM

## 2023-10-11 DIAGNOSIS — I440 Atrioventricular block, first degree: Secondary | ICD-10-CM

## 2023-10-11 MED ORDER — REPATHA SURECLICK 140 MG/ML ~~LOC~~ SOAJ: 1.0000 mL | SUBCUTANEOUS | 1 refills | Status: DC

## 2023-10-11 NOTE — Telephone Encounter (Signed)
 Please complete PA for Repatha

## 2023-10-11 NOTE — Telephone Encounter (Signed)
 PA request has been Approved. New Encounter has been or will be created for follow up. For additional info see Pharmacy Prior Auth telephone encounter from 10/11/23.

## 2023-10-11 NOTE — Telephone Encounter (Signed)
 Pharmacy Patient Advocate Encounter  Received notification from [facility] [employer] that Prior Authorization for REPATHA has been APPROVED from 10/11/23 to 04/08/24. Ran test claim, Copay is $47. This test claim was processed through [facility]- copay amounts may vary at other pharmacies due to pharmacy/plan contracts, or as the patient moves through the different stages of their insurance plan.

## 2023-10-11 NOTE — Telephone Encounter (Signed)
 Pharmacy Patient Advocate Encounter   Received notification from Physician's Office that prior authorization for REPATHA is required/requested.   Insurance verification completed.   The patient is insured through [facility] ADVANTAGE/RX ADVANCE .   Per test claim: PA required; PA submitted to above mentioned insurance via CoverMyMeds Key/confirmation #/EOC [id number] Status is pending

## 2023-10-11 NOTE — Patient Instructions (Signed)
 Medication Instructions:  No medication changes were made during today's visit.  *If you need a refill on your cardiac medications before your next appointment, please call your pharmacy*   Lab Work: No labs were ordered during today's visit.  If you have labs (blood work) drawn today and your tests are completely normal, you will receive your results only by: MyChart Message (if you have MyChart) OR A paper copy in the mail If you have any lab test that is abnormal or we need to change your treatment, we will call you to review the results.   Testing/Procedures: No procedures were ordered during today's visit.    Follow-Up: At [facility], you and your health needs are our priority.  As part of our continuing mission to provide you with exceptional heart care, we have created designated Provider Care Teams.  These Care Teams include your primary Cardiologist (physician) and Advanced Practice Providers (APPs -  Physician Assistants and Nurse Practitioners) who all work together to provide you with the care you need, when you need it.  We recommend signing up for the patient portal called "MyChart".  Sign up information is provided on this After Visit Summary.  MyChart is used to connect with patients for Virtual Visits (Telemedicine).  Patients are able to view lab/test results, encounter notes, upcoming appointments, etc.  Non-urgent messages can be sent to your provider as well.   To learn more about what you can do with MyChart, go to [email or web address].    Your next appointment:   6 month(s)  Provider:   Dr. Weston Brass     Other Instructions [facility]  [address] [place], [place] [address] [address] [phone]       1st Floor: - Lobby - Registration  - Pharmacy  - Lab - Cafe   2nd Floor: - PV Lab - Diagnostic Testing (echo, CT, nuclear med)   3rd Floor: - Vacant   4th Floor: - TCTS (cardiothoracic surgery) -  AFib Clinic - Structural Heart Clinic - Vascular Surgery  - Vascular Ultrasound   5th Floor: - HeartCare Cardiology (general and EP) - Clinical Pharmacy for coumadin, hypertension, lipid, weight-loss medications, and med management appointments      Valet parking services will be available as well.       Thank you for choosing [facility]!

## 2023-10-11 NOTE — Progress Notes (Signed)
 Patient ID: Clarence Ware, male   DOB: 30-Oct-1950, 73 y.o.   MRN: [id number]     Primary M.D.: Dr. Elgie Congo  HPI: Clarence Ware is a 73 y.o. male presents to the office today for a 5 month followup cardiology evaluation.  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  Recently, another brother has a chronic aortic dissection for which she's been treated medically.  Clarence Ware has a history of hyperlipidemia and hypertension.  Over 10 years ago he had a normal routine treadmill test. The patient has continued to exercise regularly. There is a prior history of substance abuse for which he did undergo treatment.  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  He  has a history of hyperlipidemia, erectile dysfunction, a history of intermittent headaches.  When I saw him on 11/07/2012, we discussed the sensitivity and specificity of routine treadmill testing with a strong family history I elected to schedule him for a cardiopulmonary met test for further evaluation would also be helpful to assess endothelial dysfunction/diastolic dysfunction evaluate potential for microvascular angina. He also had a soft cardiac murmur at his apex and recommended a 2-D echo Doppler study.  His cardiopulmonary met test revealed that he had good functional capacity with an excellent maximum oxygen consumption peak of 92% of predicted. However, he had a abnormal response during the last several minutes of exercise suggesting ischemic myocardial dysfunction with decreasing stroke volume with increasing work rate which corresponded to an abrupt decrease in cardiac output suggestive of exercise-induced myocardial dysfunction. He did have very mild ventilation/perfusion mismatch  with reference to his pulmonary status.  A 2-D echo Doppler study showed normal systolic function with an ejection fraction of 55-60%. He had normal diastolic parameters.  When I saw him in followup of his noninvasive studies in June 2014 due to the mild abnormality noted during the last 7 minutes of exercise I recommended the addition of ARB therapy and further titrated his atorvastatin for aggressive lipid management. He has tolerated initiation of losartan.  I last saw him on most 2 years ago.  He denied any  chest pain or palpitations. He He did have a followup NMR lipoprotein of which showed an LDL particle number of 1216 with a calculated LDL of 86, HDL 46, triglycerides 58, total cholesterol 144. He had 552 small LDL some particles. HDL particle number was reduced at 29.1. Insulin resistance score was normal at 33.  He is working as a Teacher, adult education on [employer] patients and is not exercising as he had in the past.  However, he is still walking at least 3 days per week for a minimum of 30 minutes.  He underwent a sleep study at [facility] on 05/03/201 and was not found to have significant obstructive sleep apnea and his AHI and RDI were both 2.9.  He has significant periodic limb movements of sleep and had snoring.  He tells me he had a mouth piece customized by Dr. Althea Grimmer for treatment of his snoring.   I saw him in January 2019.  He denies any episodes of chest pain or shortness of breath.  He is unaware of palpitations.  He uses his mouthguard to  sleep and denies any awareness of breakthrough snoring.  He continues to be on losartan 25 mg daily, and metoprolol succinate 25 mg daily for hypertension.  He has continued to be on atorvastatin 20 mg.  Of note, laboratory 6 months ago showed an LDL cholesterol, which had risen up to 90.  Diet evaluation I further titrated atorvastatin to 40 mg.  Over the last 6 months he has continued to remain stable.  He  underwent repeat laboratory on his increased atorvastatin which had not significantly changed.  Total cholesterol was 150, triglycerides 63, HDL 48, LDL 89.  He denies chest pain PND orthopnea.  He denies shortness of breath.  He has noticed some rare dizziness particularly when he bends over.    He was  evaluated in a telemedicine visit on October 11, 2018.  Prior to his evaluation in June 2019 he had self reduced his metoprolol succinate from 25 mg down to 12.5 mg due to noticing that his heart rate was morning somewhat slow.  He started to notice that particularly following exertion his blood pressure at times have gotten low and he has recorded blood pressure levels of 114/71 with a pulse of 67, 106/68 with a pulse of 65, 95/60 with a pulse of 63 and on 1 occasion systolic blood pressure had dropped to as low as 88.  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  He denied any episodes of chest pain.  He was taking losartan 25 mg daily in addition to Toprol-XL 12.5 mg daily and was on atorvastatin 40 mg and Zetia 10 mg for hyperlipidemia.  In January 2020 LDL cholesterol was 70.  He continues to take aspirin.  He admits to some fatigue associated with bradycardia.  He was using a customized oral appliance for his mild sleep apnea and followed by Dr. Althea Grimmer for his snoring/sleep apnea.  I saw him in June 2021 and since his previous evaluation he was having significant dreaming and falling out of bed diving due to potential REM related sleep disorder.  He ultimately was referred to Dr. Porfirio Mylar Dohmeier and apparently underwent an MRI  for assessment.  He is aware of the association of potential future Parkinson disease or Lewy body disorder associated with REM behavior parasomnias.  He does not know the results of his MRI imaging.   He denies chest pain or palpitations.  Sometimes if he stands abruptly he may note mild dizzy spells but this has improved with discontinuance of atenolol and switching to carvedilol.  I saw him on August 04, 2020 at which time he remained stable and was trying to slow down and has been working only approximately 7 days/month.    He has not had any further significant night terrors he is on clonazepam in addition to melatonin 6 mg at bedtime.  20 pounds over the past year.  He has continued to be on losartan 25 mg.  He is on atorvastatin 40 mg and Zetia 10 mg for hyperlipidemia.  He sees Dr. Sanda Linger who checks his laboratory.    I saw him on March 09, 2021.  At that time, with his history of REM sleep night terrors he is now involved in a Parkinson's disease study sponsored by the [employer].  He  is now fully retired.  He is using an oral appliance for sleep apnea followed by Dr. Myrtis Ser.  He denies chest pain or shortness of breath.  He is now on melatonin for his night terror and has a prescription for clonazepam but has never taken this.  He continues to be on atorvastatin 40 mg and Zetia 10 mg for hyperlipidemia.    When I saw him on September 19, 2021 he felt well.  Since his retirement at the end of August 2022 he has more time and exercises regularly and walking, swimming, water aerobics.  He denies chest pain or palpitations.  He continues to be on atorvastatin 40 mg  and Zetia 10 mg for hyperlipidemia, olmesartan 40 mg for hypertension, and Zyrtec on an as-needed basis.  His blood pressure has been stable.  His LDL cholesterol in February 2023 was 66.  He was taking melatonin and is involved in a future potential Parkinson's disease study in light of his REM sleep night terrors.  When I saw him on Nov 14, 2022 he felt well and is without chest pain or shortness of breath.  He is exercising daily for at least 30 minutes.  He swims approximately 3 to 4 days/week.  He underwent coronary  calcium score on October 10, 2022.  Calcium score was 213 placing him at 53rd percentile.  Calcium score in the left main was 35, LAD 175, circumflex 0, and RCA 2. He also was noted to have calcification of his aortic valve. He underwent an echo Doppler study on October 13, 2022 which showed normal LV function with EF 60 to 65%.  There was mild LVH.  There was grade 1 diastolic dysfunction.  He had mild mitral regurgitation.  There was moderate calcification of the trileaflet aortic valve without evidence for stenosis.  He continues to be on atorvastatin 40 mg and Zetia 10 mg.  He is on Jardiance 10 mg.  He continues to take melatonin 3 mg at bedtime.  He is on aspirin 81 mg.    I last saw him on May 17, 2023.  At times he notes occasional dizziness particularly when climbing steps.  He remains active and swims and walks regularly.  He continues to undergo periodic evaluation with his REM sleep behavior disorder and will be having a yearly assessment.  He denies recent palpitations.  There is no chest pain.  He is on Zetia at 10 mg and rosuvastatin 20 mg for hyperlipidemia.  He takes Klonopin 0.5 mg on an as needed basis.  He takes a baby aspirin.  He had undergone laboratory on April 04, 2023.  CBC and chemistry was stable.  Glucose was minimally increased at 104.  Lipid studies were excellent with total cholesterol 132, triglycerides 59, HDL 56 and LDL at 64.  He was found to have elevated LP(a) on assessment on Nov 14, 2022 at 167.7.  During that evaluation, with his increased LP(a) I recommended more aggressive titration of rosuvastatin to 40 mg with plan for target LDL less than 50 if at all possible.  Since I saw him, he has done well.  There was an instance where he had developed some dizziness but there was concerned that he may have been overheated and dehydrated.  He has now been on the rosuvastatin 40 mg instead of 20 mg and continues to be on Zetia 10 mg daily.  He had repeat laboratory on October 01, 2023.  This revealed only minimal further reduction of LDL  cholesterol down from 64 to 62.  He was concerned that he may not be getting optimal treatment despite the maximization of rosuvastatin dose.  He remains asymptomatic without chest pain or shortness of breath.  He presents for evaluation.  Past Medical History:  Diagnosis Date   Allergy    First degree heart block    Hyperlipidemia    Hypertension    REM sleep behavior disorder    Substance abuse [facility])     Past Surgical History:  Procedure Laterality Date   INGUINAL HERNIA REPAIR Right 03/10/2015   Procedure: OPEN RIGHT INGUINAL HERNIA REPAIR;  Surgeon: Luretha Murphy, MD;  Location: [facility];  Service: General;  Laterality: Right;  With MESH   VASECTOMY      Allergies  Allergen Reactions   Nexium [Esomeprazole Magnesium]     Awful regurgitation    Current Outpatient Medications  Medication Sig Dispense Refill   aspirin EC 81 MG tablet Take 81 mg by mouth daily.     celecoxib (CELEBREX) 100 MG capsule Take 1 capsule (100 mg total) by mouth 2 (two) times daily as needed. 90 capsule 1   cetirizine (ZYRTEC) 10 MG tablet Take 10 mg by mouth as directed. PRN     Cholecalciferol (VITAMIN D) 2000 units CAPS Take 1 capsule by mouth daily.     clonazePAM (KLONOPIN) 0.5 MG tablet Take 0.5 tablets (0.25 mg total) by mouth at bedtime as needed for anxiety. 45 tablet 2   docusate sodium (COLACE) 50 MG capsule Take 100 mg by mouth daily as needed for mild constipation or moderate constipation.     empagliflozin (JARDIANCE) 10 MG TABS tablet Take 1 tablet (10 mg total) by mouth daily before breakfast. 60 tablet 0   melatonin 3 MG TABS tablet Take 2 tablets (6 mg total) by mouth at bedtime. (Patient taking differently: Take 3 mg by mouth at bedtime.) 180 tablet 1   MYRBETRIQ 50 MG TB24 tablet Take 50 mg by mouth daily.     alfuzosin (UROXATRAL) 10 MG 24 hr tablet TAKE 1 TABLET BY MOUTH EVERY MORNING 90 tablet 1   Evolocumab (REPATHA  SURECLICK) 140 MG/ML SOAJ Inject 140 mg into the skin every 14 (fourteen) days. 6 mL 1   ezetimibe (ZETIA) 10 MG tablet TAKE 1 TABLET BY MOUTH DAILY 90 tablet 1   rosuvastatin (CRESTOR) 40 MG tablet Take 1 tablet (40 mg total) by mouth daily. 90 tablet 3   No current facility-administered medications for this visit.    Socially he is married has 2 children no grandchildren. He does exercise at least 30 minutes at a time. There is a prior history of tobacco use but he quit over 3 years ago and a prior use of opiates. 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  ROS General: Negative; No fevers, chills, or night sweats;  HEENT: Negative; No changes in vision or hearing, sinus congestion, difficulty swallowing Pulmonary: Negative; No cough, wheezing, shortness of breath, hemoptysis Cardiovascular: See history of present illness GI: Negative; No nausea, vomiting, diarrhea, or abdominal pain GU: Negative; No dysuria, hematuria, or difficulty voiding Musculoskeletal: Negative; no myalgias, joint pain, or weakness Hematologic/Oncology: Negative; no easy bruising, bleeding Endocrine: Negative; no heat/cold intolerance; no diabetes Neuro: Negative; no changes in balance, headaches Skin: Negative; No rashes or skin lesions Psychiatric: Negative; No behavioral problems, depression Sleep: Sleep study negative for OSA but suspicious for increased upper airway resistance.  He did have periodic limb movements that were frequent.  No hypnogognic hallucinations, no cataplexy.  REM related sleep disorder. Other comprehensive 14 point system review is negative.  PE BP 120/78   Pulse 67   Ht 6' 2.5" (1.892 m)   Wt 195 lb 6.4 oz (88.6 kg)   SpO2 99%   BMI 24.75 kg/m    Repeat blood pressure by me was 132/78.  Wt Readings from Last 3 Encounters:  10/11/23 195 lb 6.4 oz (88.6 kg)  10/02/23 191 lb 7.2 oz (86.8 kg)  09/12/23  193 lb (87.5 kg)    General: Alert, oriented, no distress.  Skin: normal turgor, no rashes, warm and dry HEENT: Normocephalic, atraumatic. Pupils equal round and reactive to light; sclera anicteric; extraocular muscles intact;  Nose without nasal septal hypertrophy Mouth/Parynx benign; Mallinpatti scale 3 Neck: No JVD, no carotid bruits; normal carotid upstroke Lungs: clear to ausculatation and percussion; no wheezing or rales Chest wall: without tenderness to palpitation Heart: PMI not displaced, RRR, s1 s2 normal, 1/6 systolic murmur at apex, no diastolic murmur, no rubs, gallops, thrills, or heaves Abdomen: soft, nontender; no hepatosplenomehaly, BS+; abdominal aorta nontender and not dilated by palpation. Back: no CVA tenderness Pulses 2+ Musculoskeletal: full range of motion, normal strength, no joint deformities Extremities: no clubbing cyanosis or edema, Homan's sign negative  Neurologic: grossly nonfocal; Cranial nerves grossly wnl Psychologic: Normal mood and affect     EKG Interpretation Date/Time:  Thursday October 11 2023 09:48:13 EDT Ventricular Rate:  67 PR Interval:  242 QRS Duration:  82 QT Interval:  370 QTC Calculation: 390 R Axis:   18  Text Interpretation: Sinus rhythm with 1st degree A-V block Septal infarct , age undetermined When compared with ECG of 17-May-2023 08:19, No significant change was found Confirmed by Nicki Guadalajara ([id number]) on 10/11/2023 11:14:02 AM    Nov 14, 2022 ECG (independently read by me): Low atrial rhythm at 59, no ectopy  September 19, 2021 ECG (independently read by me): NSR at 64, 1st degree AV block; PR 240 msec, no ectopy  August 31,2022 ECG (independently read by me): Sinus rhythm at 64; 1st degree AV block    August 05, 2019 ECG (independently read by me): Sinus rhythm at 63, 1st degree AV bloick: PR 236  June 2021 ECG (independently read by me): Sinus rhythm at 64; 1st degree AV block, PR 232    msec; no ectopy  November 2020 ECG  (independently read by me): Sinus bradycardia 55 bpm, first-degree AV block with a PR interval 238 ms.  QTc interval 380 ms.  No ectopy.  June 2019 ECG (independently read by me): Normal sinus rhythm at 62 bpm.  First-degree AV block with a PR interval at 240 ms.  No ectopy.  Normal intervals.   July 18, 2017 ECG (independently read by me): Normal sinus rhythm with an isolated PVC.  PR interval 202 ms.  Heart rate 63 bpm.  October 2017 ECG (independently read by me): Sinus bradycardia 57 bpm.  First-degree AV block with a PR interval at 228 ms.  No ST segment changes.  June 2016 ECG (independently read by me): Sinus rhythm with first-degree AV block.  PR interval 264 ms.  QTc interval normal 381 ms.  September 2014 ECG: Sinus bradycardia for the first degree AV block. PR interval  220 ms. QT interval 393 ms.  LABS:    Latest Ref Rng & Units 10/01/2023    8:13 AM 04/04/2023    8:35  AM 09/11/2022    8:06 AM  BMP  Glucose 70 - 99 mg/dL 956  387  96   BUN 8 - 27 mg/dL 27  21  20    Creatinine 0.76 - 1.27 mg/dL 5.64  3.32  9.51   BUN/Creat Ratio 10 - 24 26     Sodium 134 - 144 mmol/L 141  139  140   Potassium 3.5 - 5.2 mmol/L 4.9  4.8  4.1   Chloride 96 - 106 mmol/L 103  103  104   CO2 20 - 29 mmol/L 22  29  28    Calcium 8.6 - 10.2 mg/dL 9.5  9.8  9.7       Latest Ref Rng & Units 10/01/2023    8:13 AM 04/04/2023    8:35 AM 09/11/2022    8:06 AM  Hepatic Function  Total Protein 6.0 - 8.5 g/dL 6.7  6.7  6.6   Albumin 3.8 - 4.8 g/dL 4.4  4.4  4.0   AST 0 - 40 IU/L 24  22  25    ALT 0 - 44 IU/L 27  22  29    Alk Phosphatase 44 - 121 IU/L 95  74  80   Total Bilirubin 0.0 - 1.2 mg/dL 0.5  0.7  0.5   Bilirubin, Direct 0.0 - 0.3 mg/dL  0.1  0.1       Latest Ref Rng & Units 10/02/2023    9:47 AM 04/04/2023    8:35 AM 09/11/2022    8:06 AM  CBC  WBC 4.0 - 10.5 K/uL 6.9  5.8  6.2   Hemoglobin 13.0 - 17.0 g/dL 88.4  16.6  06.3   Hematocrit 39.0 - 52.0 % 45.3  44.3  42.6   Platelets 150.0 -  400.0 K/uL 199.0  187.0  184.0    Lab Results  Component Value Date   MCV 93.3 10/02/2023   MCV 91.2 04/04/2023   MCV 91.1 09/11/2022   Lab Results  Component Value Date   TSH 2.31 10/02/2023   Lab Results  Component Value Date   HGBA1C 6.4 10/02/2023     Lipid Panel     Component Value Date/Time   CHOL 126 10/01/2023 0813   CHOL 144 03/03/2013 0855   TRIG 55 10/01/2023 0813   TRIG 58 03/03/2013 0855   HDL 52 10/01/2023 0813   HDL 46 03/03/2013 0855   CHOLHDL 2.4 10/01/2023 0813   CHOLHDL 2 04/04/2023 0835   VLDL 11.8 04/04/2023 0835   LDLCALC 62 10/01/2023 0813   LDLCALC 86 03/03/2013 0855     RADIOLOGY:   ECHO: 10/13/2022  1. Left ventricular ejection fraction, by estimation, is 60 to 65%. The  left ventricle has normal function. The left ventricle has no regional  wall motion abnormalities. There is mild left ventricular hypertrophy.  Left ventricular diastolic parameters  are consistent with Grade I diastolic dysfunction (impaired relaxation).   2. Right ventricular systolic function is normal. The right ventricular  size is normal.   3. The mitral valve is normal in structure. Mild mitral valve  regurgitation. No evidence of mitral stenosis.   4. The aortic valve is calcified. There is moderate calcification of the  aortic valve. Aortic valve regurgitation is not visualized. Aortic valve  sclerosis is present, with no evidence of aortic valve stenosis. Aortic  valve mean gradient measures 5.0  mmHg. Aortic valve Vmax measures 1.58 m/s.   5. The inferior vena cava is normal in size with greater than  50%  respiratory variability, suggesting right atrial pressure of 3 mmHg.   IMPRESSION:  1. Essential hypertension   2. REM sleep behavior disorder   3. Mild mitral regurgitation   4. First degree heart block   5. Hyperlipidemia with target LDL less than 50   6. 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  He has had first-degree block.  In the past, he has had some episodes of mild dizziness particularly if he bends over which has resulted in reduction in his beta-blocker regimen.  Remotely, his atenolol dose was switched to carvedilol low-dose which had been beneficial.  On subsequent evaluations, he continued to experience the dizziness and it was recommended that he ultimately wean and discontinue carvedilol.  He also had been started on olmesartan in place of losartan.  However, at a subsequent evaluation his blood pressure was excellent and he was no longer on medication and did not have any significant orthostasis.  A CT cardiac score which showed a score of 213 Agatston units with left main calcification at 35, LAD 175, and RCA at 2. His most recent echo Doppler study which shows normal LV function with mild LVH and grade 1 diastolic dysfunction.  There is evidence for aortic sclerosis without stenosis.  When seen in May 2024 ECG showed a low atrial rhythm with negative P wave inferiorly.  Subsequent ECG today at follow-up visit showed sinus bradycardia 57 bpm with first-degree AV block.  He was found to have significant elevation of LP(a) at 167.7.  As result at last evaluation I recommended target LDL less than 50 and increased rosuvastatin from 20 to 40 mg and he has continued Zetia 10 mg daily.  His most recent laboratory on the increased maximum rosuvastatin had only decreased LDL from 64 down to 62.  I discussed with him potential benefit of Repatha which may induce a 26 to 30% LP(a) reduction and that clinical trials are underway for significant near obliteration of LP(a) levels.  Presently, we will see if we can get prior approval for Repatha and if started he may  be able to discontinue Zetia and continue with the rosuvastatin 40 mg daily.  He is on [place].  Most recent hemoglobin A1c was 6.4.  He is followed by Dr. Marcello Moores for primary care.  He is aware of my upcoming retirement.  We discussed transition of care and I will transition him to the care of Dr. Jacques Navy who he was aware of and suggested her name to me.  It has been a pleasure taking care of him over these many years.  Clarence Bihari, MD, [facility]  10/14/2023 1:57 PM

## 2023-11-19 ENCOUNTER — Other Ambulatory Visit: Payer: Self-pay | Admitting: Neurology

## 2023-12-04 ENCOUNTER — Encounter: Payer: Self-pay | Admitting: Cardiovascular Disease

## 2023-12-04 DIAGNOSIS — E785 Hyperlipidemia, unspecified: Secondary | ICD-10-CM

## 2023-12-04 DIAGNOSIS — E7841 Elevated Lipoprotein(a): Secondary | ICD-10-CM

## 2023-12-07 ENCOUNTER — Encounter: Payer: Self-pay | Admitting: Internal Medicine

## 2023-12-07 ENCOUNTER — Ambulatory Visit (INDEPENDENT_AMBULATORY_CARE_PROVIDER_SITE_OTHER): Admitting: Internal Medicine

## 2023-12-07 ENCOUNTER — Ambulatory Visit: Payer: Self-pay | Admitting: Internal Medicine

## 2023-12-07 ENCOUNTER — Ambulatory Visit (INDEPENDENT_AMBULATORY_CARE_PROVIDER_SITE_OTHER)

## 2023-12-07 VITALS — BP 116/74 | HR 59 | Temp 97.8°F | Ht 74.5 in | Wt 194.8 lb

## 2023-12-07 DIAGNOSIS — I700 Atherosclerosis of aorta: Secondary | ICD-10-CM | POA: Diagnosis not present

## 2023-12-07 DIAGNOSIS — M47816 Spondylosis without myelopathy or radiculopathy, lumbar region: Secondary | ICD-10-CM | POA: Diagnosis not present

## 2023-12-07 DIAGNOSIS — M545 Low back pain, unspecified: Secondary | ICD-10-CM | POA: Diagnosis not present

## 2023-12-07 DIAGNOSIS — M170 Bilateral primary osteoarthritis of knee: Secondary | ICD-10-CM | POA: Diagnosis not present

## 2023-12-07 DIAGNOSIS — M5126 Other intervertebral disc displacement, lumbar region: Secondary | ICD-10-CM | POA: Diagnosis not present

## 2023-12-07 MED ORDER — CELECOXIB 100 MG PO CAPS: 100.0000 mg | ORAL_CAPSULE | Freq: Two times a day (BID) | ORAL | 1 refills | Status: AC | PRN

## 2023-12-07 MED ORDER — METHYLPREDNISOLONE ACETATE 40 MG/ML IJ SUSP: 40.0000 mg | Freq: Once | INTRAMUSCULAR | Status: DC

## 2023-12-07 MED ORDER — METHYLPREDNISOLONE ACETATE 40 MG/ML IJ SUSP
40.0000 mg | Freq: Once | INTRAMUSCULAR | Status: AC
  Administered 2023-12-07: 120 mg via INTRAMUSCULAR

## 2023-12-07 NOTE — Progress Notes (Signed)
 Subjective:  Patient ID: Clarence Ware, male    DOB: September 29, 1950  Age: 73 y.o. MRN: [id number]  CC: Back Pain (A full week. The pain starts off at about a 8 but after a shower and a heating pad its about a 4 per patient. )   HPI Clarence Ware presents for f/up ----    Discussed the use of AI scribe software for clinical note transcription with the patient, who gave verbal consent to proceed.  History of Present Illness   [name] "Clarence Ware" is a 73 year old male who presents with low back pain.  He has been experiencing low back pain for the past week, which he attributes to splitting wood. The pain is described as a stinging pressure, primarily located in the upper lumbar region. It is most severe in the morning, making it difficult to stand. The pain does not radiate into his legs or feet.  No numbness, weakness, or tingling in his legs or feet. No associated symptoms such as fevers, chills, night sweats, sudden weight loss, abdominal pain, nausea, vomiting, or blood in the urine.  He has a history of back trouble, having been treated with prednisone approximately three to four years ago, but has not had any imaging studies like an MRI to confirm a herniated disc or arthritis.  For pain management, he has been taking Tylenol  and sometimes ibuprofen , depending on the situation. He also has Celebrex  available and mentions having 100 mg tablets.       Outpatient Medications Prior to Visit  Medication Sig Dispense Refill   alfuzosin  (UROXATRAL ) 10 MG 24 hr tablet TAKE 1 TABLET BY MOUTH EVERY MORNING 90 tablet 1   aspirin  EC 81 MG tablet Take 81 mg by mouth daily.     cetirizine (ZYRTEC) 10 MG tablet Take 10 mg by mouth as directed. PRN     Cholecalciferol (VITAMIN D ) 2000 units CAPS Take 1 capsule by mouth daily.     clonazePAM  (KLONOPIN ) 0.5 MG tablet TAKE 1/2 TABLET BY MOUTH AT BEDTIME AS NEEDED FOR ANXIETY 45 tablet 0   docusate sodium (COLACE) 50 MG capsule Take 100 mg by  mouth daily as needed for mild constipation or moderate constipation.     empagliflozin  (JARDIANCE ) 10 MG TABS tablet Take 1 tablet (10 mg total) by mouth daily before breakfast. 60 tablet 0   Evolocumab  (REPATHA  SURECLICK) 140 MG/ML SOAJ Inject 140 mg into the skin every 14 (fourteen) days. 6 mL 1   ezetimibe  (ZETIA ) 10 MG tablet TAKE 1 TABLET BY MOUTH DAILY 90 tablet 1   melatonin 3 MG TABS tablet Take 2 tablets (6 mg total) by mouth at bedtime. (Patient taking differently: Take 3 mg by mouth at bedtime.) 180 tablet 1   MYRBETRIQ 50 MG TB24 tablet Take 50 mg by mouth daily.     rosuvastatin  (CRESTOR ) 40 MG tablet Take 1 tablet (40 mg total) by mouth daily. 90 tablet 3   celecoxib  (CELEBREX ) 100 MG capsule Take 1 capsule (100 mg total) by mouth 2 (two) times daily as needed. 90 capsule 1   No facility-administered medications prior to visit.    ROS Review of Systems  Constitutional:  Negative for appetite change, chills, diaphoresis, fatigue and fever.  HENT: Negative.    Respiratory: Negative.  Negative for cough, chest tightness, shortness of breath and wheezing.   Cardiovascular:  Negative for chest pain, palpitations and leg swelling.  Gastrointestinal: Negative.  Negative for abdominal pain, constipation, diarrhea, nausea and vomiting.  Genitourinary:  Negative for difficulty urinating, dysuria and hematuria.  Musculoskeletal:  Positive for arthralgias and back pain. Negative for joint swelling and myalgias.  Skin: Negative.   Neurological: Negative.  Negative for dizziness, weakness and numbness.  Hematological:  Negative for adenopathy. Does not bruise/bleed easily.  Psychiatric/Behavioral: Negative.      Objective:  BP 116/74 (BP Location: Left Arm, Patient Position: Sitting)   Pulse (!) 59   Temp 97.8 F (36.6 C) (Oral)   Ht 6' 2.5" (1.892 m)   Wt 194 lb 12.8 oz (88.4 kg)   SpO2 99%   BMI 24.68 kg/m   BP Readings from Last 3 Encounters:  12/07/23 116/74  10/11/23  120/78  10/02/23 122/72    Wt Readings from Last 3 Encounters:  12/07/23 194 lb 12.8 oz (88.4 kg)  10/11/23 195 lb 6.4 oz (88.6 kg)  10/02/23 191 lb 7.2 oz (86.8 kg)    Physical Exam Vitals reviewed.  Constitutional:      Appearance: Normal appearance.  HENT:     Nose: Nose normal.     Mouth/Throat:     Mouth: Mucous membranes are moist.  Eyes:     General: No scleral icterus.    Conjunctiva/sclera: Conjunctivae normal.  Cardiovascular:     Rate and Rhythm: Regular rhythm. Bradycardia present.     Heart sounds: No murmur heard.    No friction rub. No gallop.  Pulmonary:     Effort: Pulmonary effort is normal. No respiratory distress.     Breath sounds: No stridor. No wheezing, rhonchi or rales.  Chest:     Chest wall: No tenderness.  Abdominal:     Palpations: There is no mass.     Tenderness: There is no abdominal tenderness. There is no guarding.     Hernia: No hernia is present.  Musculoskeletal:        General: Normal range of motion.     Cervical back: Normal and neck supple.     Thoracic back: Normal.     Lumbar back: Tenderness and bony tenderness present. No swelling, edema, deformity or spasms. Normal range of motion. Negative right straight leg raise test and negative left straight leg raise test. No scoliosis.     Right lower leg: No edema.     Left lower leg: No edema.  Lymphadenopathy:     Cervical: No cervical adenopathy.  Skin:    General: Skin is warm.  Neurological:     General: No focal deficit present.     Mental Status: He is alert.     Sensory: Sensation is intact.     Motor: Motor function is intact.     Coordination: Coordination is intact.     Gait: Gait is intact. Gait and tandem walk normal.     Deep Tendon Reflexes: Reflexes normal.     Reflex Scores:      Tricep reflexes are 1+ on the right side and 1+ on the left side.      Bicep reflexes are 1+ on the right side and 1+ on the left side.      Brachioradialis reflexes are 1+ on the  right side and 1+ on the left side.      Patellar reflexes are 2+ on the right side and 2+ on the left side.      Achilles reflexes are 1+ on the right side and 1+ on the left side.    Lab Results  Component Value Date   WBC 6.9 10/02/2023  HGB 15.0 10/02/2023   HCT 45.3 10/02/2023   PLT 199.0 10/02/2023   GLUCOSE 101 (H) 10/01/2023   CHOL 126 10/01/2023   TRIG 55 10/01/2023   HDL 52 10/01/2023   LDLCALC 62 10/01/2023   ALT 27 10/01/2023   AST 24 10/01/2023   NA 141 10/01/2023   K 4.9 10/01/2023   CL 103 10/01/2023   CREATININE 1.02 10/01/2023   BUN 27 10/01/2023   CO2 22 10/01/2023   TSH 2.31 10/02/2023   PSA 2.79 04/04/2023   HGBA1C 6.4 10/02/2023   DG Lumbar Spine Complete Result Date: 12/07/2023 CLINICAL DATA:  LBP, injury EXAM: LUMBAR SPINE - COMPLETE 4+ VIEW COMPARISON:  None Available. FINDINGS: No fracture, dislocation, or spondylolisthesis. Vertebral body and intervertebral disc height maintained throughout. Small anterior endplate spurs at all lumbar levels. Mild facet DJD L4-S1 left greater than right. Aortic Atherosclerosis (ICD10-170.0). IMPRESSION: 1. No acute findings. 2. Mild lower lumbar facet DJD. Electronically Signed   By: Nicoletta Barrier M.D.   On: 12/07/2023 11:04     Assessment & Plan:   Herniated lumbar disc without myelopathy -     Celecoxib ; Take 1 capsule (100 mg total) by mouth 2 (two) times daily as needed.  Dispense: 90 capsule; Refill: 1 -     methylPREDNISolone  Acetate  Acute bilateral low back pain without sciatica- He has no alarming symptoms. Exam is normal. Plain films reveal DDD. -     DG Lumbar Spine Complete; Future -     Celecoxib ; Take 1 capsule (100 mg total) by mouth 2 (two) times daily as needed.  Dispense: 90 capsule; Refill: 1 -     methylPREDNISolone  Acetate  Primary osteoarthritis of both knees -     Celecoxib ; Take 1 capsule (100 mg total) by mouth 2 (two) times daily as needed.  Dispense: 90 capsule; Refill: 1      Follow-up: Return in about 3 months (around 03/08/2024).  Sandra Crouch, MD

## 2023-12-07 NOTE — Patient Instructions (Signed)
 Lumbosacral Strain A lumbosacral strain is an injury that causes pain in the lower back (lumbosacral spine). This injury usually happens from overstretching the muscles or ligaments along the spine. Ligaments are cord-like tissues that connect bones to each other. A strain can affect one or more muscles or ligaments. What are the causes? This condition may be caused by: A hard, direct hit to the back. Overstretching the lower back muscles. This may result from: A fall. Lifting something heavy. Repeated movements such as bending or crouching. What increases the risk? The following factors make you more likely to have a lumbosacral strain: Taking part in sports or activities that involve: A sudden twist of the back. Pushing or pulling motions. Being overweight or obese. Having poor strength and flexibility, especially tight hamstrings or weak muscles in the back or abdomen. Having too much of a curve in the lower back. Having a pelvis that is tilted forward. What are the signs or symptoms? The main symptom of this condition is pain in the lower back, at the site of the strain. Pain may also be felt down one or both legs. How is this diagnosed? This condition is diagnosed based on: Your symptoms and medical history. A physical exam. During the exam: Your health care provider may push on certain areas of your back to find the source of your pain. You may be asked to bend forward, backward, and side to side to check your pain and range of motion. You may also have imaging tests, such as X-rays and an MRI. How is this treated? This condition may be treated by: Applying heat and cold to the affected area. Taking medicines for pain and to relax your muscles. Taking NSAIDs, such as ibuprofen, to help reduce swelling and discomfort. Doing stretching and strengthening exercises for your lower back. Symptoms usually improve within several weeks of treatment. But recovery time varies. When your  symptoms improve, gradually return to your normal routine as soon as possible. This will help reduce pain, avoid stiffness, and keep muscle strength. Follow these instructions at home: Medicines Take over-the-counter and prescription medicines only as told by your health care provider. Ask your health care provider if the medicine prescribed to you: Requires you to avoid driving or using machinery. Can cause constipation. You may need to take these actions to prevent or treat constipation: Drink enough fluid to keep your urine pale yellow. Take over-the-counter or prescription medicines. Eat foods that are high in fiber, such as beans, whole grains, and fresh fruits and vegetables. Limit foods that are high in fat and processed sugars, such as fried or sweet foods. Managing pain, stiffness, and swelling     If told, put ice on the injured area. Put ice in a plastic bag. Place a towel between your skin and the bag. Leave the ice on for 20 minutes, 2-3 times a day. If told, apply heat to the affected area as often as told by your health care provider. Use the heat source that your health care provider recommends, such as a moist heat pack or a heating pad. Place a towel between your skin and the heat source. Leave the heat on for 20-30 minutes. If your skin turns bright red, remove the heat or ice right away to prevent skin damage. The risk of damage is higher if you cannot feel pain, heat, or cold. Activity Rest as told by your health care provider. Do not stay in bed. Staying in bed for more than 1-2 days  can delay your recovery. Return to your normal activities as told by your health care provider. Ask your health care provider what activities are safe for you. Avoid activities that take a lot of energy for as long as told by your health care provider. Do exercises as told by your health care provider. This includes stretching and strengthening exercises. General instructions      Use good posture when sitting and standing. Avoid leaning forward when you sit, and avoid hunching over when you stand. Do not use any products that contain nicotine or tobacco. These products include cigarettes, chewing tobacco, and vaping devices, such as e-cigarettes. If you need help quitting, ask your health care provider. How is this prevented?  Warm up properly before physical activity, and cool down and stretch after being active. Use correct form when playing sports. Bend your knees and use correct posture when lifting heavy objects. Maintain a healthy weight. Sleep on a mattress with medium firmness to support your back. Do at least 150 minutes of moderate-intensity exercise each week, such as brisk walking or water aerobics. Try a form of exercise that takes stress off your back, such as swimming or stationary cycling. Maintain physical fitness, including: Strength. Flexibility. Contact a health care provider if: Your back pain does not improve after several weeks of treatment. Your symptoms get worse. You have a fever. Get help right away if: Your back pain is severe. You cannot stand or walk. You feel nauseous or you vomit. You develop any of the following: Trouble controlling when you urinate or when you have a bowel movement. Pain in your legs. Your feet or legs get very cold, turn pale, or look blue. Weakness in your buttocks or legs. This information is not intended to replace advice given to you by your health care provider. Make sure you discuss any questions you have with your health care provider. Document Revised: 10/30/2022 Document Reviewed: 01/17/2022 Elsevier Patient Education  2024 [employer].

## 2023-12-11 DIAGNOSIS — E785 Hyperlipidemia, unspecified: Secondary | ICD-10-CM | POA: Diagnosis not present

## 2023-12-11 DIAGNOSIS — E7841 Elevated Lipoprotein(a): Secondary | ICD-10-CM | POA: Diagnosis not present

## 2023-12-12 ENCOUNTER — Ambulatory Visit: Payer: Self-pay | Admitting: *Deleted

## 2023-12-12 LAB — LIPID PANEL
Chol/HDL Ratio: 1.8 ratio (ref 0.0–5.0)
Cholesterol, Total: 101 mg/dL (ref 100–199)
HDL: 55 mg/dL (ref >39)
LDL Chol Calc (NIH): 34 mg/dL (ref 0–99)
Triglycerides: 46 mg/dL (ref 0–149)
VLDL Cholesterol Cal: 12 mg/dL (ref 5–40)

## 2023-12-25 ENCOUNTER — Telehealth: Payer: Self-pay | Admitting: Pharmacist

## 2023-12-25 MED ORDER — REPATHA SURECLICK 140 MG/ML ~~LOC~~ SOAJ: 140.0000 mg | SUBCUTANEOUS | 0 refills | Status: DC

## 2023-12-25 NOTE — Telephone Encounter (Signed)
 Repatha  product replacement request received. prescription sent.

## 2023-12-31 ENCOUNTER — Other Ambulatory Visit: Payer: Self-pay | Admitting: Internal Medicine

## 2023-12-31 DIAGNOSIS — I5032 Chronic diastolic (congestive) heart failure: Secondary | ICD-10-CM

## 2023-12-31 DIAGNOSIS — R7303 Prediabetes: Secondary | ICD-10-CM

## 2024-01-03 NOTE — Telephone Encounter (Signed)
 Patient states he has received an appointment with Dr Acharya for Oct, 2025 already - scheduler call him earlier today .

## 2024-01-21 ENCOUNTER — Encounter (INDEPENDENT_AMBULATORY_CARE_PROVIDER_SITE_OTHER): Payer: Self-pay

## 2024-01-24 ENCOUNTER — Encounter: Payer: Self-pay | Admitting: Internal Medicine

## 2024-01-24 ENCOUNTER — Other Ambulatory Visit: Payer: Self-pay | Admitting: Internal Medicine

## 2024-01-24 DIAGNOSIS — I5032 Chronic diastolic (congestive) heart failure: Secondary | ICD-10-CM

## 2024-01-24 DIAGNOSIS — R7303 Prediabetes: Secondary | ICD-10-CM

## 2024-01-25 ENCOUNTER — Other Ambulatory Visit: Payer: Self-pay

## 2024-01-25 DIAGNOSIS — I5032 Chronic diastolic (congestive) heart failure: Secondary | ICD-10-CM

## 2024-01-25 DIAGNOSIS — R7303 Prediabetes: Secondary | ICD-10-CM

## 2024-01-25 MED ORDER — EMPAGLIFLOZIN 10 MG PO TABS: 10.0000 mg | ORAL_TABLET | Freq: Every day | ORAL | 0 refills | Status: DC

## 2024-02-17 ENCOUNTER — Other Ambulatory Visit: Payer: Self-pay | Admitting: Neurology

## 2024-02-19 ENCOUNTER — Other Ambulatory Visit: Payer: Self-pay | Admitting: Internal Medicine

## 2024-02-19 DIAGNOSIS — K5904 Chronic idiopathic constipation: Secondary | ICD-10-CM

## 2024-02-19 MED ORDER — LUBIPROSTONE 24 MCG PO CAPS: 24.0000 ug | ORAL_CAPSULE | Freq: Two times a day (BID) | ORAL | 0 refills | Status: DC

## 2024-03-26 ENCOUNTER — Ambulatory Visit: Payer: PPO | Admitting: Neurology

## 2024-03-27 ENCOUNTER — Encounter: Payer: Self-pay | Admitting: Internal Medicine

## 2024-03-28 ENCOUNTER — Other Ambulatory Visit: Payer: Self-pay | Admitting: Internal Medicine

## 2024-03-28 DIAGNOSIS — U071 COVID-19: Secondary | ICD-10-CM | POA: Insufficient documentation

## 2024-03-28 MED ORDER — NIRMATRELVIR/RITONAVIR (PAXLOVID)TABLET: 3.0000 | ORAL_TABLET | Freq: Two times a day (BID) | ORAL | 0 refills | Status: AC

## 2024-03-31 ENCOUNTER — Other Ambulatory Visit: Payer: Self-pay | Admitting: Pharmacist

## 2024-03-31 DIAGNOSIS — E785 Hyperlipidemia, unspecified: Secondary | ICD-10-CM

## 2024-03-31 DIAGNOSIS — E7841 Elevated Lipoprotein(a): Secondary | ICD-10-CM

## 2024-03-31 MED ORDER — REPATHA SURECLICK 140 MG/ML ~~LOC~~ SOAJ: 1.0000 mL | SUBCUTANEOUS | 3 refills | Status: AC

## 2024-04-02 ENCOUNTER — Encounter: Payer: Self-pay | Admitting: Internal Medicine

## 2024-04-02 ENCOUNTER — Ambulatory Visit (INDEPENDENT_AMBULATORY_CARE_PROVIDER_SITE_OTHER): Admitting: Internal Medicine

## 2024-04-02 VITALS — BP 124/78 | HR 69 | Temp 98.2°F | Resp 16 | Ht 74.5 in | Wt 193.8 lb

## 2024-04-02 DIAGNOSIS — R7303 Prediabetes: Secondary | ICD-10-CM

## 2024-04-02 DIAGNOSIS — I10 Essential (primary) hypertension: Secondary | ICD-10-CM | POA: Diagnosis not present

## 2024-04-02 DIAGNOSIS — K5904 Chronic idiopathic constipation: Secondary | ICD-10-CM | POA: Diagnosis not present

## 2024-04-02 DIAGNOSIS — Z0000 Encounter for general adult medical examination without abnormal findings: Secondary | ICD-10-CM

## 2024-04-02 DIAGNOSIS — E0840 Diabetes mellitus due to underlying condition with diabetic neuropathy, unspecified: Secondary | ICD-10-CM | POA: Diagnosis not present

## 2024-04-02 DIAGNOSIS — Z23 Encounter for immunization: Secondary | ICD-10-CM

## 2024-04-02 DIAGNOSIS — I5032 Chronic diastolic (congestive) heart failure: Secondary | ICD-10-CM

## 2024-04-02 DIAGNOSIS — Z0001 Encounter for general adult medical examination with abnormal findings: Secondary | ICD-10-CM | POA: Insufficient documentation

## 2024-04-02 LAB — CBC WITH DIFFERENTIAL/PLATELET
Basophils Absolute: 0 K/uL (ref 0.0–0.1)
Basophils Relative: 0.2 % (ref 0.0–3.0)
Eosinophils Absolute: 0.6 K/uL (ref 0.0–0.7)
Eosinophils Relative: 8.7 % — ABNORMAL HIGH (ref 0.0–5.0)
HCT: 46.4 % (ref 39.0–52.0)
Hemoglobin: 15.3 g/dL (ref 13.0–17.0)
Lymphocytes Relative: 22 % (ref 12.0–46.0)
Lymphs Abs: 1.5 K/uL (ref 0.7–4.0)
MCHC: 33 g/dL (ref 30.0–36.0)
MCV: 92.6 fl (ref 78.0–100.0)
Monocytes Absolute: 0.7 K/uL (ref 0.1–1.0)
Monocytes Relative: 10.8 % (ref 3.0–12.0)
Neutro Abs: 3.9 K/uL (ref 1.4–7.7)
Neutrophils Relative %: 58.3 % (ref 43.0–77.0)
Platelets: 197 K/uL (ref 150.0–400.0)
RBC: 5.02 Mil/uL (ref 4.22–5.81)
RDW: 13.5 % (ref 11.5–15.5)
WBC: 6.8 K/uL (ref 4.0–10.5)

## 2024-04-02 LAB — BASIC METABOLIC PANEL WITH GFR
BUN: 27 mg/dL — ABNORMAL HIGH (ref 6–23)
CO2: 29 meq/L (ref 19–32)
Calcium: 9.7 mg/dL (ref 8.4–10.5)
Chloride: 102 meq/L (ref 96–112)
Creatinine, Ser: 1.1 mg/dL (ref 0.40–1.50)
GFR: 66.48 mL/min (ref >60.00)
Glucose, Bld: 100 mg/dL — ABNORMAL HIGH (ref 70–99)
Potassium: 4.1 meq/L (ref 3.5–5.1)
Sodium: 139 meq/L (ref 135–145)

## 2024-04-02 LAB — HEMOGLOBIN A1C: Hgb A1c MFr Bld: 6.8 % — ABNORMAL HIGH (ref 4.6–6.5)

## 2024-04-02 LAB — TSH: TSH: 1.07 u[IU]/mL (ref 0.35–5.50)

## 2024-04-02 LAB — MAGNESIUM: Magnesium: 2.3 mg/dL (ref 1.5–2.5)

## 2024-04-02 NOTE — Progress Notes (Signed)
 "  Subjective:  Patient ID: Clarence Ware, male    DOB: 10/28/1950  Age: 73 y.o. MRN: [id number]  CC: Annual Exam, Diabetes, and Hyperlipidemia   HPI Clarence Ware presents for a CPX and f/up ---  Discussed the use of AI scribe software for clinical note transcription with the patient, who gave verbal consent to proceed.  History of Present Illness Clarence Ware is a 73 year old male who presents for a follow-up visit and flu vaccination.  He experiences persistent fatigue but has no cough, fevers, chills, night sweats, or sore throat. A previous cough resolved after the first day. He has had COVID-19 four times, with a cough being the defining symptom each time.  He occasionally experiences dizziness or lightheadedness, which has improved compared to the past. Dizziness sometimes occurs during exercise, particularly when working with a trainer, but not while swimming. No recent palpitations and no history of being told he has a heart murmur. He is scheduled to see a new cardiologist next week.  He has a history of constipation, which has improved since discontinuing opiates. He currently manages it with daily Colace. He previously used Senokot while on opiates.  He occasionally takes Tylenol  or ibuprofen  for pain relief, typically one dose of each per day, depending on his activities. He plans to start splitting wood soon and anticipates potential back pain.     Outpatient Medications Prior to Visit  Medication Sig Dispense Refill   alfuzosin  (UROXATRAL ) 10 MG 24 hr tablet TAKE 1 TABLET BY MOUTH EVERY MORNING 90 tablet 1   aspirin  EC 81 MG tablet Take 81 mg by mouth daily.     celecoxib  (CELEBREX ) 100 MG capsule Take 1 capsule (100 mg total) by mouth 2 (two) times daily as needed. 90 capsule 1   cetirizine (ZYRTEC) 10 MG tablet Take 10 mg by mouth as directed. PRN     Cholecalciferol (VITAMIN D ) 2000 units CAPS Take 1 capsule by mouth daily.     clonazePAM  (KLONOPIN )  0.5 MG tablet TAKE A HALF TABLET BY MOUTH EVERY NIGHT AT BEDTIME AS NEEDED FOR ANXIETY 45 tablet 0   docusate sodium (COLACE) 50 MG capsule Take 100 mg by mouth daily as needed for mild constipation or moderate constipation.     empagliflozin  (JARDIANCE ) 10 MG TABS tablet Take 1 tablet (10 mg total) by mouth daily before breakfast. 90 tablet 0   Evolocumab  (REPATHA  SURECLICK) 140 MG/ML SOAJ Inject 140 mg into the skin every 14 (fourteen) days. 6 mL 3   lubiprostone  (AMITIZA ) 24 MCG capsule Take 1 capsule (24 mcg total) by mouth 2 (two) times daily with a meal. 60 capsule 0   melatonin 3 MG TABS tablet Take 2 tablets (6 mg total) by mouth at bedtime. (Patient taking differently: Take 3 mg by mouth at bedtime.) 180 tablet 1   MYRBETRIQ 50 MG TB24 tablet Take 50 mg by mouth daily.     nirmatrelvir /ritonavir  (PAXLOVID ) 20 x 150 MG & 10 x 100MG  TABS Take 3 tablets by mouth 2 (two) times daily for 5 days. (Take nirmatrelvir  150 mg two tablets twice daily for 5 days and ritonavir  100 mg one tablet twice daily for 5 days) Patient GFR is 78 30 tablet 0   rosuvastatin  (CRESTOR ) 40 MG tablet Take 1 tablet (40 mg total) by mouth daily. 90 tablet 3   ezetimibe  (ZETIA ) 10 MG tablet TAKE 1 TABLET BY MOUTH DAILY 90 tablet 1   No facility-administered medications prior to visit.  ROS Review of Systems  Constitutional:  Positive for fatigue. Negative for appetite change, chills and diaphoresis.  HENT: Negative.  Negative for sinus pressure, sore throat and trouble swallowing.   Eyes: Negative.  Negative for visual disturbance.  Respiratory:  Negative for cough, chest tightness, shortness of breath and wheezing.   Cardiovascular:  Negative for chest pain, palpitations and leg swelling.  Gastrointestinal:  Positive for constipation. Negative for abdominal pain, blood in stool, diarrhea, nausea and vomiting.  Genitourinary: Negative.  Negative for difficulty urinating and dysuria.  Musculoskeletal:  Positive for  back pain and gait problem. Negative for arthralgias and myalgias.  Skin: Negative.   Neurological:  Negative for dizziness, weakness and headaches.  Hematological:  Negative for adenopathy. Does not bruise/bleed easily.  Psychiatric/Behavioral: Negative.      Objective:  BP 124/78 (BP Location: Left Arm, Patient Position: Sitting, Cuff Size: Normal)   Pulse 69   Temp 98.2 F (36.8 C) (Oral)   Resp 16   Ht 6' 2.5 (1.892 m)   Wt 193 lb 12.8 oz (87.9 kg)   SpO2 97%   BMI 24.55 kg/m   BP Readings from Last 3 Encounters:  04/02/24 124/78  12/07/23 116/74  10/11/23 120/78    Wt Readings from Last 3 Encounters:  04/02/24 193 lb 12.8 oz (87.9 kg)  12/07/23 194 lb 12.8 oz (88.4 kg)  10/11/23 195 lb 6.4 oz (88.6 kg)    Physical Exam Vitals reviewed.  Constitutional:      Appearance: Normal appearance.  HENT:     Nose: Nose normal.     Mouth/Throat:     Mouth: Mucous membranes are moist.  Eyes:     General: No scleral icterus.    Conjunctiva/sclera: Conjunctivae normal.  Cardiovascular:     Rate and Rhythm: Normal rate and regular rhythm.     Heart sounds: No murmur heard.    No friction rub. No gallop.  Pulmonary:     Effort: Pulmonary effort is normal.     Breath sounds: No stridor. No wheezing, rhonchi or rales.  Abdominal:     General: Abdomen is flat.     Palpations: There is no mass.     Tenderness: There is no abdominal tenderness. There is no guarding.     Hernia: No hernia is present.  Musculoskeletal:        General: Normal range of motion.     Cervical back: Neck supple.     Right lower leg: No edema.     Left lower leg: No edema.  Lymphadenopathy:     Cervical: No cervical adenopathy.  Skin:    General: Skin is warm and dry.     Findings: No rash.  Neurological:     General: No focal deficit present.     Mental Status: He is alert.  Psychiatric:        Mood and Affect: Mood normal.        Behavior: Behavior normal.     Lab Results   Component Value Date   WBC 6.8 04/02/2024   HGB 15.3 04/02/2024   HCT 46.4 04/02/2024   PLT 197.0 04/02/2024   GLUCOSE 100 (H) 04/02/2024   CHOL 101 12/11/2023   TRIG 46 12/11/2023   HDL 55 12/11/2023   LDLCALC 34 12/11/2023   ALT 27 10/01/2023   AST 24 10/01/2023   NA 139 04/02/2024   K 4.1 04/02/2024   CL 102 04/02/2024   CREATININE 1.10 04/02/2024   BUN 27 (H)  04/02/2024   CO2 29 04/02/2024   TSH 1.07 04/02/2024   PSA 2.79 04/04/2023   HGBA1C 6.8 (H) 04/02/2024    No results found.  Assessment & Plan:  Essential hypertension- BP is well controlled. -     Basic metabolic panel with GFR; Future -     CBC with Differential/Platelet; Future  Need for immunization against influenza -     Flu vaccine HIGH DOSE PF(Fluzone Trivalent)  Chronic heart failure with preserved ejection fraction (HFpEF) (HCC)- No signs of fluid excess.  Chronic idiopathic constipation -     Magnesium; Future -     TSH; Future  Encounter for general adult medical examination with abnormal findings- Exam completed, labs reviewed, vaccines reviewed and updated, cancer screenings are UTD, pt ed material was given.   Diabetes mellitus due to underlying condition with diabetic neuropathy, without long-term current use of insulin (HCC)- A1C is 6.8%. Well controlled.     Follow-up: Return in about 6 months (around 09/30/2024).  Debby Molt, MD "

## 2024-04-02 NOTE — Patient Instructions (Signed)
 Health Maintenance, Male  Adopting a healthy lifestyle and getting preventive care are important in promoting health and wellness. Ask your health care provider about:  The right schedule for you to have regular tests and exams.  Things you can do on your own to prevent diseases and keep yourself healthy.  What should I know about diet, weight, and exercise?  Eat a healthy diet    Eat a diet that includes plenty of vegetables, fruits, low-fat dairy products, and lean protein.  Do not eat a lot of foods that are high in solid fats, added sugars, or sodium.  Maintain a healthy weight  Body mass index (BMI) is a measurement that can be used to identify possible weight problems. It estimates body fat based on height and weight. Your health care provider can help determine your BMI and help you achieve or maintain a healthy weight.  Get regular exercise  Get regular exercise. This is one of the most important things you can do for your health. Most adults should:  Exercise for at least 150 minutes each week. The exercise should increase your heart rate and make you sweat (moderate-intensity exercise).  Do strengthening exercises at least twice a week. This is in addition to the moderate-intensity exercise.  Spend less time sitting. Even light physical activity can be beneficial.  Watch cholesterol and blood lipids  Have your blood tested for lipids and cholesterol at 74 years of age, then have this test every 5 years.  You may need to have your cholesterol levels checked more often if:  Your lipid or cholesterol levels are high.  You are older than 74 years of age.  You are at high risk for heart disease.  What should I know about cancer screening?  Many types of cancers can be detected early and may often be prevented. Depending on your health history and family history, you may need to have cancer screening at various ages. This may include screening for:  Colorectal cancer.  Prostate cancer.  Skin cancer.  Lung  cancer.  What should I know about heart disease, diabetes, and high blood pressure?  Blood pressure and heart disease  High blood pressure causes heart disease and increases the risk of stroke. This is more likely to develop in people who have high blood pressure readings or are overweight.  Talk with your health care provider about your target blood pressure readings.  Have your blood pressure checked:  Every 3-5 years if you are 24-52 years of age.  Every year if you are 3 years old or older.  If you are between the ages of 60 and 72 and are a current or former smoker, ask your health care provider if you should have a one-time screening for abdominal aortic aneurysm (AAA).  Diabetes  Have regular diabetes screenings. This checks your fasting blood sugar level. Have the screening done:  Once every three years after age 66 if you are at a normal weight and have a low risk for diabetes.  More often and at a younger age if you are overweight or have a high risk for diabetes.  What should I know about preventing infection?  Hepatitis B  If you have a higher risk for hepatitis B, you should be screened for this virus. Talk with your health care provider to find out if you are at risk for hepatitis B infection.  Hepatitis C  Blood testing is recommended for:  Everyone born from [birth year] through 1966.  Anyone  with known risk factors for hepatitis C.  Sexually transmitted infections (STIs)  You should be screened each year for STIs, including gonorrhea and chlamydia, if:  You are sexually active and are younger than 74 years of age.  You are older than 74 years of age and your health care provider tells you that you are at risk for this type of infection.  Your sexual activity has changed since you were last screened, and you are at increased risk for chlamydia or gonorrhea. Ask your health care provider if you are at risk.  Ask your health care provider about whether you are at high risk for HIV. Your health care provider  may recommend a prescription medicine to help prevent HIV infection. If you choose to take medicine to prevent HIV, you should first get tested for HIV. You should then be tested every 3 months for as long as you are taking the medicine.  Follow these instructions at home:  Alcohol use  Do not drink alcohol if your health care provider tells you not to drink.  If you drink alcohol:  Limit how much you have to 0-2 drinks a day.  Know how much alcohol is in your drink. In the [place] one drink equals one 12 oz bottle of beer (355 mL), one 5 oz glass of wine (148 mL), or one 1 oz glass of hard liquor (44 mL).  Lifestyle  Do not use any products that contain nicotine or tobacco. These products include cigarettes, chewing tobacco, and vaping devices, such as e-cigarettes. If you need help quitting, ask your health care provider.  Do not use street drugs.  Do not share needles.  Ask your health care provider for help if you need support or information about quitting drugs.  General instructions  Schedule regular health, dental, and eye exams.  Stay current with your vaccines.  Tell your health care provider if:  You often feel depressed.  You have ever been abused or do not feel safe at home.  Summary  Adopting a healthy lifestyle and getting preventive care are important in promoting health and wellness.  Follow your health care provider's instructions about healthy diet, exercising, and getting tested or screened for diseases.  Follow your health care provider's instructions on monitoring your cholesterol and blood pressure.  This information is not intended to replace advice given to you by your health care provider. Make sure you discuss any questions you have with your health care provider.  Document Revised: 11/15/2020 Document Reviewed: 11/15/2020  Elsevier Patient Education  2024 [employer].

## 2024-04-03 ENCOUNTER — Ambulatory Visit: Payer: Self-pay | Admitting: Internal Medicine

## 2024-04-03 DIAGNOSIS — E0840 Diabetes mellitus due to underlying condition with diabetic neuropathy, unspecified: Secondary | ICD-10-CM | POA: Insufficient documentation

## 2024-04-09 ENCOUNTER — Ambulatory Visit: Admitting: Neurology

## 2024-04-09 ENCOUNTER — Encounter: Payer: Self-pay | Admitting: Neurology

## 2024-04-09 ENCOUNTER — Telehealth: Payer: Self-pay | Admitting: Neurology

## 2024-04-09 VITALS — BP 120/78 | HR 60 | Ht 74.0 in | Wt 194.8 lb

## 2024-04-09 DIAGNOSIS — R430 Anosmia: Secondary | ICD-10-CM | POA: Diagnosis not present

## 2024-04-09 DIAGNOSIS — G4752 REM sleep behavior disorder: Secondary | ICD-10-CM

## 2024-04-09 DIAGNOSIS — G20A1 Parkinson's disease without dyskinesia, without mention of fluctuations: Secondary | ICD-10-CM

## 2024-04-09 DIAGNOSIS — R490 Dysphonia: Secondary | ICD-10-CM

## 2024-04-09 NOTE — Progress Notes (Unsigned)
 [facility] [address] [place] [address] Phone: [phone] Subjective:    I'm seeing this patient by the request  of:  Joshua Debby CROME, MD  CC: Right knee pain  YEP:Dlagzrupcz  [facility] is a 73 y.o. male coming in with complaint of R knee pain. Last seen for wrist pain in Nov 2024. Injection for R knee in March 2025 by Dr. Joane. Patient states that his pain is anterio medial. Pain with weight bearing.       Past Medical History:  Diagnosis Date   Allergy    First degree heart block    Hyperlipidemia    Hypertension    REM sleep behavior disorder    Substance abuse [facility])    Past Surgical History:  Procedure Laterality Date   INGUINAL HERNIA REPAIR Right 03/10/2015   Procedure: OPEN RIGHT INGUINAL HERNIA REPAIR;  Surgeon: Donnice Lunger, MD;  Location: [facility];  Service: General;  Laterality: Right;  With MESH   VASECTOMY     Social History   Socioeconomic History   Marital status: Married    Spouse name: Dorothy   Number of children: 2   Years of education: Not on file   Highest education level: Professional school degree (e.g., MD, DDS, DVM, JD)  Occupational History   Occupation: Pharmacist part-time  Tobacco Use   Smoking status: Former    Current packs/day: 0.00    Average packs/day: 0.3 packs/day for 1 year (0.3 ttl pk-yrs)    Types: Cigarettes    Start date: 07/11/1991    Quit date: 07/10/1992    Years since quitting: 31.7   Smokeless tobacco: Never  Vaping Use   Vaping status: Never Used  Substance and Sexual Activity   Alcohol use: No    Alcohol/week: 0.0 standard drinks of alcohol   Drug use: Not Currently    Comment: sobrity for 10 years   Sexual activity: Yes  Other Topics Concern   Not on file  Social History Narrative   Married. Education: [employer].   Exercise: some   Caffeine- coffee 2 c daily      Lives with wife.   Social Drivers of Corporate investment banker Strain: Low Risk  (03/29/2024)    Overall Financial Resource Strain (CARDIA)    Difficulty of Paying Living Expenses: Not hard at all  Food Insecurity: No Food Insecurity (03/29/2024)   Hunger Vital Sign    Worried About Running Out of Food in the Last Year: Never true    Ran Out of Food in the Last Year: Never true  Transportation Needs: No Transportation Needs (03/29/2024)   PRAPARE - Administrator, Civil Service (Medical): No    Lack of Transportation (Non-Medical): No  Physical Activity: Sufficiently Active (03/29/2024)   Exercise Vital Sign    Days of Exercise per Week: 5 days    Minutes of Exercise per Session: 40 min  Stress: No Stress Concern Present (03/29/2024)   [employer] of Occupational Health - Occupational Stress Questionnaire    Feeling of Stress: Not at all  Social Connections: Socially Integrated (03/29/2024)   Social Connection and Isolation Panel    Frequency of Communication with Friends and Family: More than three times a week    Frequency of Social Gatherings with Friends and Family: More than three times a week    Attends Religious Services: 1 to 4 times per year    Active Member of [employer] or Organizations: Yes  Attends Engineer, structural: More than 4 times per year    Marital Status: Married   Allergies  Allergen Reactions   Nexium [Esomeprazole Magnesium]     Awful regurgitation   Family History  Problem Relation Age of Onset   Heart disease Mother    Stroke Mother    Heart disease Father 85       MI   Hypertension Father    Hypertension Brother    Cancer Brother    Diabetes Brother    Heart disease Brother    Hyperlipidemia Brother    Cancer Brother    Hyperlipidemia Sister    Coronary artery disease Brother 41       CABG   Colon cancer Paternal Grandmother     Current Outpatient Medications (Endocrine & Metabolic):    empagliflozin  (JARDIANCE ) 10 MG TABS tablet, Take 1 tablet (10 mg total) by mouth daily before breakfast.  Current Outpatient  Medications (Cardiovascular):    Evolocumab  (REPATHA  SURECLICK) 140 MG/ML SOAJ, Inject 140 mg into the skin every 14 (fourteen) days.   rosuvastatin  (CRESTOR ) 20 MG tablet, Take 1 tablet (20 mg total) by mouth daily.  Current Outpatient Medications (Respiratory):    cetirizine (ZYRTEC) 10 MG tablet, Take 10 mg by mouth as directed. PRN (Patient taking differently: Take 10 mg by mouth as needed for allergies. PRN)  Current Outpatient Medications (Analgesics):    aspirin  EC 81 MG tablet, Take 81 mg by mouth daily.   celecoxib  (CELEBREX ) 100 MG capsule, Take 1 capsule (100 mg total) by mouth 2 (two) times daily as needed.   Current Outpatient Medications (Other):    alfuzosin  (UROXATRAL ) 10 MG 24 hr tablet, TAKE 1 TABLET BY MOUTH EVERY MORNING   Cholecalciferol (VITAMIN D ) 2000 units CAPS, Take 1 capsule by mouth daily.   clonazePAM  (KLONOPIN ) 0.5 MG tablet, TAKE A HALF TABLET BY MOUTH EVERY NIGHT AT BEDTIME AS NEEDED FOR ANXIETY   docusate sodium (COLACE) 50 MG capsule, Take 100 mg by mouth daily as needed for mild constipation or moderate constipation.   melatonin 3 MG TABS tablet, Take 2 tablets (6 mg total) by mouth at bedtime.   MYRBETRIQ 50 MG TB24 tablet, Take 50 mg by mouth daily.   Reviewed prior external information including notes and imaging from  primary care provider As well as notes that were available from care everywhere and other healthcare systems.  Past medical history, social, surgical and family history all reviewed in electronic medical record.  No pertanent information unless stated regarding to the chief complaint.   Review of Systems:  No headache, visual changes, nausea, vomiting, diarrhea, constipation, dizziness, abdominal pain, skin rash, fevers, chills, night sweats, weight loss, swollen lymph nodes, body aches, joint swelling, chest pain, shortness of breath, mood changes. POSITIVE muscle aches  Objective  Blood pressure 122/88, pulse (!) 56, height 6' 2  (1.88 m), weight 196 lb (88.9 kg), SpO2 96%.   General: No apparent distress alert and oriented x3 mood and affect normal, dressed appropriately.  HEENT: Pupils equal, extraocular movements intact  Respiratory: Patient's speak in full sentences and does not appear short of breath  Cardiovascular: No lower extremity edema, non tender, no erythema  Mild antalgic gait noted.  Mild effusion noted of the right knee.  Mild crepitus of the patellofemoral joint.  Tender to palpation more on the anterior medial aspect of the knee.  Mild positive McMurray's test noted.  Limited muscular skeletal ultrasound was performed and interpreted by CLAUDENE,  Tailyn Hantz, M  Limited ultrasound shows that patient does have hypoechoic changes noted.  This seems to have some narrowing of the medial joint space.  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  Patient does have moderate narrowing of the patellofemoral joint as well. 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 Right knee was prepped with alcohol swab and utilizing anterolateral approach, patient's right knee space was injected with 4:1  marcaine  0.5%: Kenalog 40mg /dL. Patient tolerated the procedure well without immediate complications.    Impression and Recommendations:    The above documentation has been reviewed and is accurate and complete Ethanael Veith M Seon Gaertner, DO

## 2024-04-09 NOTE — Progress Notes (Signed)
 Provider:  Dedra Gores, MD   Primary Care Physician:  Joshua Debby CROME, MD [address] [place] [address]     Referring Provider: Joshua Debby CROME, Md [address] [place],  [place] [address]          Chief Complaint according to patient   Patient presents with:                HISTORY OF PRESENT ILLNESS:  Clarence Ware is a 73 y.o. male patient who is  followed here for PD/ MCI revisit 04/09/2024 . He has been seen at the [facility] [place] [facility] in April and had skin biopsies( synuclein) CSF for synuclein ) and cognitive testing that is very detailed. 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  He has not used the dental device in years now, due to teeth shifting.   Dreams are less intense, he doesn't remember them in detail.  Wife sleeps in another room.   DAT scan [facility] . : 72% normal range.   MRI brain - normal. 10-24-2023  research result -   PDR scale - 6/ 132, very little impairment   Smell test 14% percentile 9 low)  CSF positive for alpha synuclein, Type 1     Chief concern according to patient :  I am stabile.     Fam Hx : see previous note  Social HX; see previous note  - vigorous exercise.   Swimming  @ [employer] , walking.     Clarence Ware is a 73 y.o. male , he works as a Teacher, early years/pre . He was seen here as a referral from Dr. Joshua for for a sleep consultation for the first time in 10-2015. Now he is here for a different concern , he lost weight, he was considered pre-diabetic, and his snoring is less. He is using a mouth piece made to measure by Dr. Micky. No longer wakes up with headaches when wearing his mouth piece.      Chief complaint according to patient : Recently the enactment of dreams has become a more prominent feature of his sleep, and of this year he had fallen out of bed dove out of bed as reported also  broke nightstand lamp in the process.  REM sleep behavior was not a feature rehab explored when we first met over 4 years ago so it is a fairly recent event.   [name] has respiratory allergies and of hyperlipidemia and hypertension, pre diabetes- all improved with weight loss.  he has no history of morbid obesity, and no history of shift work.     Review of Systems: Out of a complete 14 system review, the patient complains of only the following symptoms, and all other reviewed systems are negative.:   SLEEPINESS ?  How likely are you to doze in the following situations: 0 = not likely, 1 = slight chance, 2 = moderate chance, 3 = high chance  Sitting and Reading? Watching Television? Sitting inactive in a public place (theater or meeting)? Lying down in the afternoon when circumstances permit? Sitting and talking to someone? Sitting quietly after lunch without alcohol? In a car, while stopped for a few minutes in traffic? As a passenger in a car for an hour without a break?  Total = 9/ 24      Social History  Socioeconomic History   Marital status: Married    Spouse name: [name]   Number of children: 2   Years of education: Not on file   Highest education level: Professional school degree (e.g., MD, DDS, DVM, JD)  Occupational History   Occupation: Pharmacist part-time  Tobacco Use   Smoking status: Former    Current packs/day: 0.00    Average packs/day: 0.3 packs/day for 1 year (0.3 ttl pk-yrs)    Types: Cigarettes    Start date: 07/11/1991    Quit date: 07/10/1992    Years since quitting: 31.7   Smokeless tobacco: Never  Vaping Use   Vaping status: Never Used  Substance and Sexual Activity   Alcohol use: No    Alcohol/week: 0.0 standard drinks of alcohol   Drug use: Not Currently    Comment: sobrity for 10 years   Sexual activity: Yes  Other Topics Concern   Not on file  Social History Narrative   Married. Education: [employer].   Exercise: some    Caffeine- coffee 2 c daily      Lives with wife.   Social Drivers of Corporate investment banker Strain: Low Risk  (03/29/2024)   Overall Financial Resource Strain (CARDIA)    Difficulty of Paying Living Expenses: Not hard at all  Food Insecurity: No Food Insecurity (03/29/2024)   Hunger Vital Sign    Worried About Running Out of Food in the Last Year: Never true    Ran Out of Food in the Last Year: Never true  Transportation Needs: No Transportation Needs (03/29/2024)   PRAPARE - Administrator, Civil Service (Medical): No    Lack of Transportation (Non-Medical): No  Physical Activity: Sufficiently Active (03/29/2024)   Exercise Vital Sign    Days of Exercise per Week: 5 days    Minutes of Exercise per Session: 40 min  Stress: No Stress Concern Present (03/29/2024)   [employer] of Occupational Health - Occupational Stress Questionnaire    Feeling of Stress: Not at all  Social Connections: Socially Integrated (03/29/2024)   Social Connection and Isolation Panel    Frequency of Communication with Friends and Family: More than three times a week    Frequency of Social Gatherings with Friends and Family: More than three times a week    Attends Religious Services: 1 to 4 times per year    Active Member of [employer] or Organizations: Yes    Attends Engineer, structural: More than 4 times per year    Marital Status: Married    Family History  Problem Relation Age of Onset   Heart disease Mother    Stroke Mother    Heart disease Father 24       MI   Hypertension Father    Hypertension Brother    Cancer Brother    Diabetes Brother    Heart disease Brother    Hyperlipidemia Brother    Cancer Brother    Hyperlipidemia Sister    Coronary artery disease Brother 46       CABG   Colon cancer Paternal Grandmother     Past Medical History:  Diagnosis Date   Allergy    First degree heart block    Hyperlipidemia    Hypertension    REM sleep behavior disorder     Substance abuse (HCC)     Past Surgical History:  Procedure Laterality Date   INGUINAL HERNIA REPAIR Right 03/10/2015   Procedure: OPEN RIGHT INGUINAL HERNIA  REPAIR;  Surgeon: Donnice Lunger, MD;  Location: [facility];  Service: General;  Laterality: Right;  With MESH   VASECTOMY       Current Outpatient Medications on File Prior to Visit  Medication Sig Dispense Refill   alfuzosin  (UROXATRAL ) 10 MG 24 hr tablet TAKE 1 TABLET BY MOUTH EVERY MORNING 90 tablet 1   aspirin  EC 81 MG tablet Take 81 mg by mouth daily.     celecoxib  (CELEBREX ) 100 MG capsule Take 1 capsule (100 mg total) by mouth 2 (two) times daily as needed. 90 capsule 1   cetirizine (ZYRTEC) 10 MG tablet Take 10 mg by mouth as directed. PRN (Patient taking differently: Take 10 mg by mouth as needed for allergies. PRN)     Cholecalciferol (VITAMIN D ) 2000 units CAPS Take 1 capsule by mouth daily.     clonazePAM  (KLONOPIN ) 0.5 MG tablet TAKE A HALF TABLET BY MOUTH EVERY NIGHT AT BEDTIME AS NEEDED FOR ANXIETY 45 tablet 0   docusate sodium (COLACE) 50 MG capsule Take 100 mg by mouth daily as needed for mild constipation or moderate constipation.     empagliflozin  (JARDIANCE ) 10 MG TABS tablet Take 1 tablet (10 mg total) by mouth daily before breakfast. 90 tablet 0   Evolocumab  (REPATHA  SURECLICK) 140 MG/ML SOAJ Inject 140 mg into the skin every 14 (fourteen) days. 6 mL 3   melatonin 3 MG TABS tablet Take 2 tablets (6 mg total) by mouth at bedtime. 180 tablet 1   MYRBETRIQ 50 MG TB24 tablet Take 50 mg by mouth daily.     rosuvastatin  (CRESTOR ) 40 MG tablet Take 1 tablet (40 mg total) by mouth daily. 90 tablet 3   No current facility-administered medications on file prior to visit.    Allergies  Allergen Reactions   Nexium [Esomeprazole Magnesium]     Awful regurgitation     DIAGNOSTIC DATA (LABS, IMAGING, TESTING) - I reviewed patient records, labs, notes, testing and imaging myself where available.  Lab Results  Component  Value Date   WBC 6.8 04/02/2024   HGB 15.3 04/02/2024   HCT 46.4 04/02/2024   MCV 92.6 04/02/2024   PLT 197.0 04/02/2024      Component Value Date/Time   NA 139 04/02/2024 1444   NA 141 10/01/2023 0813   K 4.1 04/02/2024 1444   CL 102 04/02/2024 1444   CO2 29 04/02/2024 1444   GLUCOSE 100 (H) 04/02/2024 1444   BUN 27 (H) 04/02/2024 1444   BUN 27 10/01/2023 0813   CREATININE 1.10 04/02/2024 1444   CREATININE 0.91 03/02/2020 0856   CALCIUM  9.7 04/02/2024 1444   PROT 6.7 10/01/2023 0813   ALBUMIN 4.4 10/01/2023 0813   AST 24 10/01/2023 0813   ALT 27 10/01/2023 0813   ALKPHOS 95 10/01/2023 0813   BILITOT 0.5 10/01/2023 0813   GFRNONAA 86 03/02/2020 0856   GFRAA 99 03/02/2020 0856   Lab Results  Component Value Date   CHOL 101 12/11/2023   HDL 55 12/11/2023   LDLCALC 34 12/11/2023   TRIG 46 12/11/2023   CHOLHDL 1.8 12/11/2023   Lab Results  Component Value Date   HGBA1C 6.8 (H) 04/02/2024   Lab Results  Component Value Date   VITAMINB12 683 08/05/2020   Lab Results  Component Value Date   TSH 1.07 04/02/2024    PHYSICAL EXAM:  Vitals:   04/09/24 0925  BP: 120/78  Pulse: 60   No data found. Body mass index is 25.01 kg/m.  Wt Readings from Last 3 Encounters:  04/09/24 194 lb 12.8 oz (88.4 kg)  04/02/24 193 lb 12.8 oz (87.9 kg)  12/07/23 194 lb 12.8 oz (88.4 kg)     Ht Readings from Last 3 Encounters:  04/09/24 6' 2 (1.88 m)  04/02/24 6' 2.5 (1.892 m)  12/07/23 6' 2.5 (1.892 m)      General: The patient is awake, alert and appears not in acute distress and groomed. Head: Normocephalic, atraumatic.  The patient is awake, alert and appears not in acute distress. The patient is well groomed. Head: Normocephalic, atraumatic. Neck is supple.  Mallampati 3,  neck circumference:16 inches . Nasal airflow  patent.  Retrognathia is not seen. Facial hair.  Dental status: biological  Cardiovascular:  Regular rate and cardiac rhythm by pulse,  without  distended neck veins. Respiratory: Lungs are clear to auscultation.  Skin:  Without evidence of ankle edema, or rash. Trunk: The patient's posture is erect.   NEUROLOGIC EXAM: The patient is awake and alert, oriented to place and time.   Memory subjective described as intact.         04/09/2024    9:27 AM 03/21/2023    9:27 AM 03/20/2022    8:26 AM  Montreal Cognitive Assessment    Visuospatial/ Executive (0/5) FARM ANIMAL VERSION 2   5 4 5   Naming (0/3) 3 3 3   Attention: Read list of digits (0/2) 2 2 2   Attention: Read list of letters (0/1) 1 1 1   Attention: Serial 7 subtraction starting at 100 (0/3) 3 3 3   Language: Repeat phrase (0/2) 2 2 2   Language : Fluency (0/1) 1 1 1   Abstraction (0/2) 2 2 2   Delayed Recall (0/5) 3 5 3   Orientation (0/6) 6 6 6   Total 28 29 28       Attention span & concentration ability appears normal.  Speech is fluent,  without  dysarthria, but with new onset dysphonia, ( 6 months ) . No dysphagia.  Mood and affect are appropriate.   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  Pupils are equal and briskly reactive to light.  Extraocular movements in vertical and horizontal planes were intact and without nystagmus. No Diplopia. Visual fields by finger perimetry are intact. Hearing aids in place   Facial sensation intact to fine touch. Facial motor strength is symmetric and tongue and uvula move midline.  Neck ROM :  rotation, tilt and flexion extension were normal for age and shoulder shrug was symmetrical.    Motor exam:  Symmetric bulk, tone and ROM.   Normal tone without cog- wheeling, symmetric grip strength - strong.   Sensory:  Fine touch, pinprick and vibration were normal.  Proprioception tested in the upper extremities was normal.   Coordination: Rapid alternating movements in the fingers/hands were of normal speed.    The Finger-to-nose maneuver was intact without  evidence of ataxia, dysmetria or tremor.   Gait and station: Patient could rise unassisted from a seated position,  walked without assistive device.  Stance is of normal width/ base and the patient turned with 3-4  steps.  Toe and heel walk were deferred.   Deep tendon reflexes: in the  upper and lower extremities are symmetric and almost brisk.   ASSESSMENT AND PLAN :   73 y.o. 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    3) MOCA normal .   Yearly REVISIT , alternative MOCA.     I would like to thank Joshua Debby CROME, Md [address] [place],  [place] [address] for allowing me to meet with this pleasant patient.   Sleep Clinic Patients are generally offered input on sleep hygiene, life style changes and how to improve compliance with medical treatment where applicable. Review and reiteration of good sleep hygiene measures is offered to any sleep clinic patient, be it in the first consultation or with any follow up visits.    Any patient with sleepiness should be cautioned not to drive, work at heights, or operate dangerous or heavy equipment when feeling tired or sleepy.      The patient will be seen in follow-up in the sleep clinic at [facility] for discussion of test results, sleep related symptoms and treatment compliance review, further management strategies, etc.   The referring provider will be notified of the test results.   The patient's condition requires frequent monitoring and adjustments in the treatment plan, reflecting the ongoing complexity of care.  This provider is the continuing focal point for all needed services for this condition.  After spending a total time of  32  minutes face to face and time for  history taking, physical and neurologic examination, review of laboratory studies,   personal review of imaging studies, reports and results of other testing and review of referral information / records as far as provided in visit,   Electronically signed by: Dedra Gores, MD 04/09/2024 9:28 AM  [facility] Board certified by The [employer] of Sleep Medicine and Diplomate of the [employer] of Sleep Medicine. Board certified In Neurology through the ABPN, Fellow of the [employer] of Neurology.

## 2024-04-09 NOTE — Telephone Encounter (Signed)
 Referral for Speech Therapy sent through Epic to [facility]. [phone] Fax: [phone]

## 2024-04-09 NOTE — Patient Instructions (Signed)
 RV in 12 months, ST was ordered.  Dysphonia in PD

## 2024-04-10 ENCOUNTER — Ambulatory Visit: Attending: Internal Medicine | Admitting: Internal Medicine

## 2024-04-10 ENCOUNTER — Ambulatory Visit: Admitting: Internal Medicine

## 2024-04-10 VITALS — BP 114/66 | HR 68 | Ht 74.0 in | Wt 191.6 lb

## 2024-04-10 DIAGNOSIS — Z79899 Other long term (current) drug therapy: Secondary | ICD-10-CM | POA: Diagnosis not present

## 2024-04-10 DIAGNOSIS — E7841 Elevated Lipoprotein(a): Secondary | ICD-10-CM | POA: Diagnosis not present

## 2024-04-10 DIAGNOSIS — E785 Hyperlipidemia, unspecified: Secondary | ICD-10-CM

## 2024-04-10 DIAGNOSIS — I440 Atrioventricular block, first degree: Secondary | ICD-10-CM

## 2024-04-10 DIAGNOSIS — R42 Dizziness and giddiness: Secondary | ICD-10-CM

## 2024-04-10 DIAGNOSIS — E0840 Diabetes mellitus due to underlying condition with diabetic neuropathy, unspecified: Secondary | ICD-10-CM

## 2024-04-10 MED ORDER — ROSUVASTATIN CALCIUM 20 MG PO TABS: 20.0000 mg | ORAL_TABLET | Freq: Every day | ORAL | 3 refills | Status: AC

## 2024-04-10 NOTE — Patient Instructions (Addendum)
 Medication Instructions:    START TAKING:   CRESTOR   20 MG  ONCE A DAY    *If you need a refill on your cardiac medications before your next appointment, please call your pharmacy*   Lab Work:   PLEASE GO DOWN STAIRS  [facility]  FIRST FLOOR   ( GET OFF ELEVATORS WALK TOWARDS WAITING AREA LAB LOCATED BY PHARMACY):  RETURN IN 3 MONTHS  FOR FASTING  LIPIDS        If you have labs (blood work) drawn today and your tests are completely normal, you will receive your results only by: MyChart Message (if you have MyChart) OR A paper copy in the mail If you have any lab test that is abnormal or we need to change your treatment, we will call you to review the results.   Testing/Procedures: NONE ORDERED  TODAY     Follow-Up: At [facility], you and your health needs are our priority.  As part of our continuing mission to provide you with exceptional heart care, our providers are all part of one team.  This team includes your primary Cardiologist (physician) and Advanced Practice Providers or APPs (Physician Assistants and Nurse Practitioners) who all work together to provide you with the care you need, when you need it.  Your next appointment:   6 month(s)  Provider:  Dr. Matt    We recommend signing up for the patient portal called MyChart.  Sign up information is provided on this After Visit Summary.  MyChart is used to connect with patients for Virtual Visits (Telemedicine).  Patients are able to view lab/test results, encounter notes, upcoming appointments, etc.  Non-urgent messages can be sent to your provider as well.   To learn more about what you can do with MyChart, go to [email or web address].   Other Instructions

## 2024-04-10 NOTE — Progress Notes (Signed)
 Cardiology Office Note:  .   Date:  04/10/2024  ID:  Clarence Ware, DOB 07-Dec-1950, MRN [id number] PCP: Joshua Debby CROME, MD  [facility] Providers Cardiologist:  Debby Sor, MD (Inactive)    History of Present Illness: .   Clarence Ware is a 73 y.o. male.  Discussed the use of AI scribe software for clinical note transcription with the patient, who gave verbal consent to proceed.  History of Present Illness Clarence Ware is a 73 year old male who presents for follow-up after his previous doctor's retirement.  He has a first degree AV block and experienced dizziness on a beta blocker, leading to a reduced dose of carvedilol  and eventual discontinuation. He is not on blood pressure medication currently. His blood pressure is typically 120/70-75 but was low post-exercise. Occasional dizziness is managed with electrolyte solutions, with no significant blood pressure changes from salt intake.  His coronary calcium  score is 213, and lipoprotein A level is 167. His LDL is 34, managed with Crestor  40 mg and Repatha  140 mg every 14 days. He switched from atorvastatin  to Crestor  without significant LDL change. Repatha  started six months ago has reduced LDL levels.  He reports no chest pain or consistent shortness of breath during activities like hiking. Occasionally, he experiences shortness of breath when climbing stairs, resolving within 20 seconds. Lightheadedness occurs sometimes, but it is transient. Current medications include Crestor , Repatha , Jardiance  10 mg daily, and a daily baby aspirin .    ROS: negative except per HPI above.  Studies Reviewed: .        Results LABS Lipoprotein A: 167 LDL: 34 A1c: 6.8 (04/03/2024) Glucose: 101 Hemoglobin: Within normal limits Potassium: Within normal limits Liver function tests: Within normal limits Platelets: Within normal limits  RADIOLOGY Coronary calcium  score: 213 Risk Assessment/Calculations:        Physical Exam:   VS:  BP 114/66   Pulse 68   Ht 6' 2 (1.88 m)   Wt 191 lb 9.6 oz (86.9 kg)   SpO2 96%   BMI 24.60 kg/m    Wt Readings from Last 3 Encounters:  04/11/24 196 lb (88.9 kg)  04/10/24 191 lb 9.6 oz (86.9 kg)  04/09/24 194 lb 12.8 oz (88.4 kg)     Physical Exam GENERAL: Alert, cooperative, well developed, no acute distress. HEENT: Normocephalic, normal oropharynx, moist mucous membranes. CHEST: Clear to auscultation bilaterally, no wheezes, rhonchi, or crackles. CARDIOVASCULAR: Normal heart rate and rhythm, S1 and S2 normal without murmurs. ABDOMEN: Soft, non-tender, non-distended, without organomegaly, normal bowel sounds. EXTREMITIES: No cyanosis or edema. NEUROLOGICAL: Cranial nerves grossly intact, moves all extremities without gross motor or sensory deficit.   ASSESSMENT AND PLAN: .    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 Coronary calcium  score of 213. LDL goal is less than 50 mg/dL. Current LDL is 34 mg/dL, likely due to Repatha .  Plan to reduce Crestor  to assess impact on A1c and LDL levels. - Decrease Crestor  to 20 mg daily. - Continue Repatha  140 mg every 14 days. - Order fasting lipid panel in 3 months to assess LDL levels. - Consider adding Zetia  if LDL increases significantly after Crestor  dose reduction.  Type 2 diabetes mellitus Type 2 diabetes mellitus with recent A1c of 6.8%. A1c increased after Crestor  dose increase. Currently taking Jardiance  for glycemic control. Plan to monitor A1c after Crestor  dose reduction to assess impact. - Continue Jardiance  10  mg daily as prescribed by PCP. - Monitor A1c after Crestor  dose reduction to assess impact on glycemic control.  First degree atrioventricular (AV) block First degree AV block with no current symptoms or recent EKG performed. Previous EKG was satisfactory. No current need  for intervention.  Orthostatic symptoms Intermittent dizziness and lightheadedness, previously on beta blocker which was discontinued. Blood pressure is generally 120/70-75 mmHg. Occasional low blood pressure. No significant impact from dietary salt intake, ok to liberalize. Uses electrolyte solutions for dizziness, which is effective. - Continue using electrolyte solutions as needed for dizziness. - Consider using compression socks if orthostatic symptoms persist.        [name], MD, FACC

## 2024-04-11 ENCOUNTER — Other Ambulatory Visit: Payer: Self-pay

## 2024-04-11 ENCOUNTER — Encounter: Payer: Self-pay | Admitting: Family Medicine

## 2024-04-11 ENCOUNTER — Ambulatory Visit: Admitting: Family Medicine

## 2024-04-11 VITALS — BP 122/88 | HR 56 | Ht 74.0 in | Wt 196.0 lb

## 2024-04-11 DIAGNOSIS — G8929 Other chronic pain: Secondary | ICD-10-CM

## 2024-04-11 DIAGNOSIS — M23203 Derangement of unspecified medial meniscus due to old tear or injury, right knee: Secondary | ICD-10-CM | POA: Diagnosis not present

## 2024-04-11 DIAGNOSIS — M25561 Pain in right knee: Secondary | ICD-10-CM

## 2024-04-11 NOTE — Patient Instructions (Addendum)
 Injected knee today Meniscus exercises Avoid twisting motions See me in 6-8 weeks

## 2024-04-11 NOTE — Assessment & Plan Note (Signed)
 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  Ultrasound does show some more progression of the arthritis and the x-rays had shown.  Seems to be the patellofemoral and the medial joint space.  Injection given today, tolerated the procedure well discussed postinjection protocol.  Discussed home exercises and VMO strengthening and follow-up again 6 to 12 weeks

## 2024-04-12 ENCOUNTER — Other Ambulatory Visit: Payer: Self-pay | Admitting: Internal Medicine

## 2024-04-12 DIAGNOSIS — I5032 Chronic diastolic (congestive) heart failure: Secondary | ICD-10-CM

## 2024-04-12 DIAGNOSIS — R7303 Prediabetes: Secondary | ICD-10-CM

## 2024-04-18 ENCOUNTER — Encounter: Payer: Self-pay | Admitting: Family Medicine

## 2024-04-18 ENCOUNTER — Ambulatory Visit (INDEPENDENT_AMBULATORY_CARE_PROVIDER_SITE_OTHER): Admitting: Family Medicine

## 2024-04-18 VITALS — BP 122/70 | Temp 98.0°F | Ht 74.0 in | Wt 193.0 lb

## 2024-04-18 DIAGNOSIS — H6121 Impacted cerumen, right ear: Secondary | ICD-10-CM

## 2024-04-18 NOTE — Progress Notes (Addendum)
   Acute Office Visit  Subjective:     Patient ID: Clarence Ware, male    DOB: 13-May-1951, 73 y.o.   MRN: [id number]  Chief Complaint  Patient presents with   Acute Visit    Went to be fitted for hearing aids and was told right ear needed cleaning-no pain, no sx    HPI  73 year old male presents for ear evaluation after seeing audiology yesterday.  States that he was told that his left ear has built-up wax and would likely need to be cleaned out. Denies any decreased hearing, discharge or pain from the ears. Does wear bilateral hearing aids.    ROS Per HPI      Objective:    BP 122/70 (BP Location: Left Arm, Patient Position: Sitting)   Temp 98 F (36.7 C) (Oral)   Ht 6' 2 (1.88 m)   Wt 193 lb (87.5 kg)   SpO2 97%   BMI 24.78 kg/m    Physical Exam Vitals and nursing note reviewed.  Constitutional:      General: He is not in acute distress.    Appearance: Normal appearance.  HENT:     Head: Normocephalic and atraumatic.     Right Ear: External ear normal. There is impacted cerumen.     Left Ear: External ear normal. There is no impacted cerumen.     Nose: Nose normal.     Mouth/Throat:     Mouth: Mucous membranes are moist.  Eyes:     Extraocular Movements: Extraocular movements intact.  Cardiovascular:     Rate and Rhythm: Normal rate.  Pulmonary:     Effort: Pulmonary effort is normal.  Musculoskeletal:        General: Normal range of motion.     Cervical back: Normal range of motion.     Right lower leg: No edema.     Left lower leg: No edema.  Lymphadenopathy:     Cervical: No cervical adenopathy.  Skin:    General: Skin is warm and dry.  Neurological:     General: No focal deficit present.     Mental Status: He is alert and oriented to person, place, and time.  Psychiatric:        Mood and Affect: Mood normal.        Behavior: Behavior normal.   Ear Cerumen Removal  Date/Time: 04/29/2024 8:01 AM  Performed by: Alvia Corean CROME,  FNP Authorized by: Alvia Corean CROME, FNP   Anesthesia: Local Anesthetic: none Location details: right ear Patient tolerance: patient tolerated the procedure well with no immediate complications Procedure type: irrigation  Sedation: Patient sedated: no       No results found for any visits on 04/18/24.      Assessment & Plan:    Impacted cerumen of right ear -     Ear Lavage  Other orders -     Ear Cerumen Removal      No orders of the defined types were placed in this encounter.    No orders of the defined types were placed in this encounter.   Return if symptoms worsen or fail to improve.  Corean CROME Alvia, FNP

## 2024-04-18 NOTE — Patient Instructions (Signed)
 We have cleaned out your ears in the office today.  You may use Debrox at home to try to help keep the wax from building up.  Follow-up with [place] as needed if you are having any persistent symptoms.

## 2024-04-20 ENCOUNTER — Ambulatory Visit (HOSPITAL_COMMUNITY)

## 2024-04-20 ENCOUNTER — Other Ambulatory Visit: Payer: Self-pay | Admitting: Internal Medicine

## 2024-04-20 DIAGNOSIS — N401 Enlarged prostate with lower urinary tract symptoms: Secondary | ICD-10-CM

## 2024-04-28 ENCOUNTER — Other Ambulatory Visit: Payer: Self-pay

## 2024-04-28 ENCOUNTER — Ambulatory Visit: Attending: Neurology

## 2024-04-28 DIAGNOSIS — R430 Anosmia: Secondary | ICD-10-CM | POA: Diagnosis not present

## 2024-04-28 DIAGNOSIS — R471 Dysarthria and anarthria: Secondary | ICD-10-CM | POA: Diagnosis not present

## 2024-04-28 DIAGNOSIS — R490 Dysphonia: Secondary | ICD-10-CM | POA: Insufficient documentation

## 2024-04-28 DIAGNOSIS — G4752 REM sleep behavior disorder: Secondary | ICD-10-CM | POA: Insufficient documentation

## 2024-04-28 DIAGNOSIS — G20A1 Parkinson's disease without dyskinesia, without mention of fluctuations: Secondary | ICD-10-CM | POA: Insufficient documentation

## 2024-04-28 NOTE — Patient Instructions (Signed)
   [facility] [address] [place],  [place]  [address]  Main: [phone]  Fax: [phone]

## 2024-04-28 NOTE — Therapy (Signed)
 OUTPATIENT SPEECH LANGUAGE PATHOLOGY VOICE EVALUATION   Patient Name: Clarence Ware MRN: [id number] DOB:1950-11-19, 73 y.o., male Today's Date: 04/28/2024  PCP: Joshua Ned, MD REFERRING PROVIDER: Dohmeier, Dedra, MD  END OF SESSION:  End of Session - 04/28/24 1016     Visit Number 1    Number of Visits 9    Date for Recertification  07/04/24    SLP Start Time 0850    SLP Stop Time  0937    SLP Time Calculation (min) 47 min    Activity Tolerance Patient tolerated treatment well          Past Medical History:  Diagnosis Date   Allergy    First degree heart block    Hyperlipidemia    Hypertension    REM sleep behavior disorder    Substance abuse [facility])    Past Surgical History:  Procedure Laterality Date   INGUINAL HERNIA REPAIR Right 03/10/2015   Procedure: OPEN RIGHT INGUINAL HERNIA REPAIR;  Surgeon: Donnice Lunger, MD;  Location: [facility];  Service: General;  Laterality: Right;  With MESH   VASECTOMY     Patient Active Problem List   Diagnosis Date Noted   Impacted cerumen of right ear 04/18/2024   Degenerative tear of medial meniscus of right knee 04/11/2024   Diabetes mellitus due to underlying condition with diabetic neuropathy, without long-term current use of insulin (HCC) 04/03/2024   Chronic idiopathic constipation 04/02/2024   Need for immunization against influenza 04/02/2024   Encounter for general adult medical examination with abnormal findings 04/02/2024   Herniated lumbar disc without myelopathy 12/07/2023   High serum lipoprotein(a) 10/11/2023   BPH associated with nocturia 04/04/2023   Chronic heart failure with preserved ejection fraction (HFpEF) (HCC) 10/14/2022   Need for vaccination 09/12/2022   Sleep related bruxism 08/24/2022   UARS (upper airway resistance syndrome) 08/24/2022   Primary osteoarthritis of both knees 12/13/2021   Cerebellar ataxia in diseases classified elsewhere (HCC) 09/01/2021   Seborrheic keratosis 09/01/2021    BPH with urinary obstruction 09/01/2021   Age-related cognitive decline 08/05/2020   Uncontrolled REM sleep behavior disorder 03/16/2020   Essential hypertension 12/18/2014   Hyperlipidemia with target LDL less than 70 12/18/2014   First degree heart block 03/30/2013   GERD 06/26/2008   ERECTILE DYSFUNCTION, MILD 01/30/2008   COLON POLYP 09/06/2006    Onset date: 6 months   REFERRING DIAG: R49.0 (ICD-10-CM) - Dysphonia R43.0 (ICD-10-CM) - Anosmia G47.52 (ICD-10-CM) - Sleep behavior disorder, REM G20.A1 (ICD-10-CM) - Parkinson's disease, unspecified whether dyskinesia present, unspecified whether manifestations fluctuate (HCC)  THERAPY DIAG:  Dysarthria and anarthria  Rationale for Evaluation and Treatment: Rehabilitation  SUBJECTIVE:   SUBJECTIVE STATEMENT: We visited friends in [place] and (my wife) said 'I couldn't hear you.'   Pt accompanied by: self  PERTINENT HISTORY: Recently pt has had more difficutly with softer speech. Has new hearing aids as of last week.   PAIN:  Are you having pain? No  FALLS: Has patient fallen in last 6 months? Yes, Number of falls: 1  LIVING ENVIRONMENT: Lives with: lives with their spouse Lives in: House/apartment  PLOF:Level of assistance: Independent with ADLs, Independent with IADLs Employment: Retired  PATIENT GOALS: Speak so I can be heard.  OBJECTIVE:  Note: Objective measures were completed at Evaluation unless otherwise noted.  DIAGNOSTIC FINDINGS: Pt is part of a REM sleep study in [place]  and goes there regularly for checkups.  COGNITION: Overall cognitive status: Within functional limits for tasks assessed  SOCIAL HISTORY: Occupation: retired Counsellor intake: suboptimal Caffeine/alcohol intake: moderate Daily voice use: moderate  PERCEPTUAL VOICE ASSESSMENT: Voice quality: normal Vocal abuse: pt states he clears throat in the mornings - SLP stated it mihgt be due to caffeinated drinks Resonance:  normal Respiratory function: diaphragmatic/abdominal breathing  MOTOR SPEECH: Overall motor speech: impaired Level of impairment: Phrase, Sentence, and Conversation Phonation: low vocal intensity Resonance: WFL Articulation: Appears intact Intelligibility: Intelligible Effective technique: increased vocal intensity  ORAL MOTOR EXAMINATION: Overall status: Impaired: Labial: Bilateral (Coordination) Lingual: Right (Strength) Bilateral (Coordination) Comments: Difficulty with coordination of alternate motion tasks with lingual and labial musculature, SLIGHT lingual strength deficit on lt and rt, with rt more pronounced than lt.  OBJECTIVE VOICE ASSESSMENT: Maximum phonation time for sustained ah: 26.6 seonds Conversational loudness average: 68 Hz Conversational loudness range: 54-82 Hz Conversational pitch average: 117 dB Conversational pitch range: 94-181 dB S/z ratio: .72 (Suggestive of dysfunction >1.0)  PATIENT REPORTED OUTCOME MEASURES (PROM): Communication Effectiveness Survey: to be provided first 1-2 sessions.                                                                                                                             TREATMENT DATE:   04/28/24: SLP guided pt through diagnostic therapy session where [name] spoke in  monologues with average 72dB, within normal limits volume. SLP talked with pt about the therapy course. Pt preferred x1/week and SLP preferred x1-2/week; agreed on x1/week. SLP Explained to pt rationale for ENT evaluation.    PATIENT EDUCATION: Education details: see Treatment date above Person educated: Patient Education method: Explanation Education comprehension: verbalized understanding and needs further education  HOME EXERCISE PROGRAM: Nothing currently;Will eventually begin to practice louder speech  GOALS: Goals reviewed with patient? No  SHORT TERM GOALS: Target date: 06/06/24  Pt will demo average WNL volume with 18/20  sentences Baseline: Goal status: INITIAL  2.  Pt will practice home tasks 4/7 days/week for 2 weeks Baseline:  Goal status: INITIAL   LONG TERM GOALS: Target date: 1226/25  Pt will improve PROM Baseline:  Goal status: INITIAL  2.  Pt will report wife does not ask him to repeat at the frequency she was prior to ST Baseline:  Goal status: INITIAL  3.  [name] will produce average WNL speech volume in 10 minutes conversation in 2 sessions Baseline:  Goal status: INITIAL  ASSESSMENT:  CLINICAL IMPRESSION: Patient is a 73 y.o. M who was seen today for assessment of voice volume due to wife and others telling him he has a softer voice than he used to have. Uncertain at this time if pt has structural basis for his reduced volume but ENT eval is recommended. Pt states he will work on getting this scheduled. Pt is in a yearly REM behavior sleep disorder study in [place] each year, and PD is a diagnosis that is possible for him at this time, but no formal diagnosis has been made.  Based upon  diagnostic therapy today pt would be a good candidate for skilled ST. Pt's plan of care may be modified based on results of ENT eval.  OBJECTIVE IMPAIRMENTS: include voice disorder. These impairments are limiting patient from household responsibilities, ADLs/IADLs, and effectively communicating at home and in community. Factors affecting potential to achieve goals and functional outcome are none noted.. Patient will benefit from skilled SLP services to address above impairments and improve overall function.  REHAB POTENTIAL: Good  PLAN:  SLP FREQUENCY: 1-2x/week however pt prefers x1/week  SLP DURATION: 8 weeks  PLANNED INTERVENTIONS: Internal/external aids, Oral motor exercises, Functional tasks, SLP instruction and feedback, Compensatory strategies, Patient/family education, and [id number] Treatment of speech (30 or 45 min)     Maryum Batterson, CCC-SLP 04/28/2024, 5:48 PM

## 2024-04-29 NOTE — Addendum Note (Signed)
 Addended by: Shadrick Senne L on: 04/29/2024 08:02 AM   Modules accepted: Orders

## 2024-05-04 ENCOUNTER — Encounter: Payer: Self-pay | Admitting: Internal Medicine

## 2024-05-07 ENCOUNTER — Other Ambulatory Visit: Payer: Self-pay | Admitting: Internal Medicine

## 2024-05-07 DIAGNOSIS — R490 Dysphonia: Secondary | ICD-10-CM

## 2024-05-15 ENCOUNTER — Ambulatory Visit: Payer: Self-pay

## 2024-05-15 NOTE — Telephone Encounter (Signed)
 FYI Only or Action Required?: FYI only for provider: appointment scheduled on 11/25.  Patient was last seen in primary care on 04/18/2024 by Alvia Corean CROME, FNP.  Called Nurse Triage reporting Dizziness.  Symptoms began a couple of months ago.  Interventions attempted: Nothing.  Symptoms are: gradually worsening.  Triage Disposition: See PCP Within 2 Weeks  Patient/caregiver understands and will follow disposition?: Yes with modifications, appointment already scheduled on 11/25 added to the wait list    Copied from CRM #[id number]. Topic: Clinical - Red Word Triage >> May 15, 2024 10:36 AM Clarence Ware wrote: Red Word that prompted transfer to Nurse Triage: dizziness with exercise      Reason for Disposition  [1] MILD dizziness (e.g., walking normally) AND [2] has been evaluated by doctor (or NP/PA) for this  Answer Assessment - Initial Assessment Questions Patient has an appointment with Clarence Ware on 11/25. Patient states he does not want to see another provider and will wait until that appointment. I have added the appointment to the wait list. Patient instructed to call back for new or worsening symptoms. Patient verbalized understanding and agreement with this plan.     1. DESCRIPTION: Describe your dizziness.     Lightheaded when exercising  2. LIGHTHEADED: Do you feel lightheaded? (e.g., somewhat faint, woozy, weak upon standing)     Yes 3. VERTIGO: Do you feel like either you or the room is spinning or tilting? (i.e., vertigo)     No 4. SEVERITY: How bad is it?  Do you feel like you are going to faint? Can you stand and walk?     Mild to moderate  5. ONSET:  When did the dizziness begin?     Worse over the last 2-3 weeks  6. AGGRAVATING FACTORS: Does anything make it worse? (e.g., standing, change in head position)     Exercising  7. HEART RATE: Can you tell me your heart rate? How many beats in 15 seconds?  (Note: Not all patients can do this.)        No 8. CAUSE: What do you think is causing the dizziness? (e.g., decreased fluids or food, diarrhea, emotional distress, heat exposure, new medicine, sudden standing, vomiting; unknown)     Exercise  9. RECURRENT SYMPTOM: Have you had dizziness before? If Yes, ask: When was the last time? What happened that time?     Yes, but states it's getting worse  10. OTHER SYMPTOMS: Do you have any other symptoms? (e.g., fever, chest pain, vomiting, diarrhea, bleeding)       No  Protocols used: Dizziness - Lightheadedness-A-AH

## 2024-05-16 ENCOUNTER — Ambulatory Visit: Attending: Internal Medicine

## 2024-05-16 ENCOUNTER — Ambulatory Visit (INDEPENDENT_AMBULATORY_CARE_PROVIDER_SITE_OTHER): Admitting: Internal Medicine

## 2024-05-16 ENCOUNTER — Encounter: Payer: Self-pay | Admitting: Internal Medicine

## 2024-05-16 VITALS — BP 138/82 | HR 55 | Temp 98.0°F | Resp 16 | Ht 74.0 in | Wt 198.6 lb

## 2024-05-16 DIAGNOSIS — R011 Cardiac murmur, unspecified: Secondary | ICD-10-CM

## 2024-05-16 DIAGNOSIS — R7989 Other specified abnormal findings of blood chemistry: Secondary | ICD-10-CM | POA: Diagnosis not present

## 2024-05-16 DIAGNOSIS — I2089 Other forms of angina pectoris: Secondary | ICD-10-CM

## 2024-05-16 DIAGNOSIS — R001 Bradycardia, unspecified: Secondary | ICD-10-CM | POA: Diagnosis not present

## 2024-05-16 DIAGNOSIS — I10 Essential (primary) hypertension: Secondary | ICD-10-CM | POA: Diagnosis not present

## 2024-05-16 DIAGNOSIS — R0789 Other chest pain: Secondary | ICD-10-CM | POA: Diagnosis not present

## 2024-05-16 DIAGNOSIS — E0840 Diabetes mellitus due to underlying condition with diabetic neuropathy, unspecified: Secondary | ICD-10-CM

## 2024-05-16 LAB — URINALYSIS, ROUTINE W REFLEX MICROSCOPIC
Bilirubin Urine: NEGATIVE
Hgb urine dipstick: NEGATIVE
Ketones, ur: NEGATIVE
Leukocytes,Ua: NEGATIVE
Nitrite: NEGATIVE
Specific Gravity, Urine: 1.01 (ref 1.000–1.030)
Total Protein, Urine: NEGATIVE
Urine Glucose: >=1000 — AB
Urobilinogen, UA: 0.2 (ref 0.0–1.0)
pH: 6 (ref 5.0–8.0)

## 2024-05-16 LAB — BASIC METABOLIC PANEL WITH GFR
BUN: 24 mg/dL — ABNORMAL HIGH (ref 6–23)
CO2: 29 meq/L (ref 19–32)
Calcium: 9.3 mg/dL (ref 8.4–10.5)
Chloride: 104 meq/L (ref 96–112)
Creatinine, Ser: 0.94 mg/dL (ref 0.40–1.50)
GFR: 80.22 mL/min (ref >60.00)
Glucose, Bld: 80 mg/dL (ref 70–99)
Potassium: 4.1 meq/L (ref 3.5–5.1)
Sodium: 141 meq/L (ref 135–145)

## 2024-05-16 LAB — TROPONIN I (HIGH SENSITIVITY): High Sens Troponin I: 6 ng/L (ref 2–17)

## 2024-05-16 LAB — BRAIN NATRIURETIC PEPTIDE: Pro B Natriuretic peptide (BNP): 159 pg/mL — ABNORMAL HIGH (ref 0.0–100.0)

## 2024-05-16 LAB — MICROALBUMIN / CREATININE URINE RATIO
Creatinine,U: 45.9 mg/dL
Microalb Creat Ratio: UNDETERMINED mg/g (ref 0.0–30.0)
Microalb, Ur: <0.7 mg/dL

## 2024-05-16 LAB — D-DIMER, QUANTITATIVE: D-Dimer, Quant: 0.49 ug{FEU}/mL (ref <0.50)

## 2024-05-16 LAB — CORTISOL: Cortisol, Plasma: 10.8 ug/dL

## 2024-05-16 NOTE — Patient Instructions (Signed)
 Bradycardia, Adult Bradycardia is a slower-than-normal heartbeat. A normal resting heart rate for an adult ranges from 60 to 100 beats per minute. With bradycardia, the resting heart rate is less than 60 beats per minute. Bradycardia can prevent enough oxygen  from reaching certain areas of your body when you are active. It can be serious if it keeps enough oxygen  from reaching your brain and other parts of your body. Bradycardia is not a problem for everyone. For some healthy adults, a slow resting heart rate is normal. What are the causes? This condition may be caused by: A problem with the heart, including: A problem with the heart's electrical system, such as a heart block. With a heart block, electrical signals between the chambers of the heart are partially or completely blocked, so they are not able to work as they should. A problem with the heart's natural pacemaker (sinus node). Heart disease. A heart attack. Heart damage. Lyme disease. A heart infection. A heart condition that is present at birth (congenital heart defect). Certain medicines that treat heart conditions. Certain conditions, such as hypothyroidism and obstructive sleep apnea. Problems with the balance of chemicals and other substances, like potassium, in the blood. Trauma. Radiation therapy. What increases the risk? You are more likely to develop this condition if you: Are age 30 or older. Have high blood pressure (hypertension), high cholesterol (hyperlipidemia), or diabetes. Drink heavily, use tobacco or nicotine products, or use drugs. What are the signs or symptoms? Symptoms of this condition include: Light-headedness. Feeling faint or fainting. Fatigue and weakness. Trouble with activity or exercise. Shortness of breath. Chest pain (angina). Drowsiness. Confusion. Dizziness. How is this diagnosed? This condition may be diagnosed based on: Your symptoms. Your medical history. A physical exam. During  the exam, your health care provider will listen to your heartbeat and check your pulse. To confirm the diagnosis, your health care provider may order tests, such as: Blood tests. An electrocardiogram (ECG). This test records the heart's electrical activity. The test can show how fast your heart is beating and whether the heartbeat is steady. A test in which you wear a portable device (event recorder or Holter monitor) to record your heart's electrical activity while you go about your day. An exercise test. How is this treated? Treatment for this condition depends on the cause of the condition and how severe your symptoms are. Treatment may involve: Treatment of the underlying condition. Changing your medicines or how much medicine you take. Having a small, battery-operated device called a pacemaker implanted under the skin. When bradycardia occurs, this device can be used to increase your heart rate and help your heart beat in a regular rhythm. Follow these instructions at home: Lifestyle Manage any health conditions that contribute to bradycardia as told by your health care provider. Follow a heart-healthy diet. A nutrition specialist (dietitian) can help educate you about healthy food options and changes. Follow an exercise program that is approved by your health care provider. Maintain a healthy weight. Try to reduce or manage your stress, such as with yoga or meditation. If you need help reducing stress, ask your health care provider. Do not use any products that contain nicotine or tobacco. These products include cigarettes, chewing tobacco, and vaping devices, such as e-cigarettes. If you need help quitting, ask your health care provider. Do not use illegal drugs. Alcohol  use If you drink alcohol : Limit how much you have to: 0-1 drink a day for women who are not pregnant. 0-2 drinks a day  for men. Know how much alcohol  is in a drink. In the [place] one drink equals one 12 oz bottle of  beer (355 mL), one 5 oz glass of wine (148 mL), or one 1 oz glass of hard liquor (44 mL). General instructions Take over-the-counter and prescription medicines only as told by your health care provider. Keep all follow-up visits. This is important. How is this prevented? In some cases, bradycardia may be prevented by: Treating underlying medical problems. Stopping behaviors or medicines that can trigger the condition. Contact a health care provider if: You feel light-headed or dizzy. You almost faint. You feel weak or are easily fatigued during physical activity. You experience confusion or have memory problems. Get help right away if: You faint. You have chest pains or an irregular heartbeat (palpitations). You have trouble breathing. These symptoms may represent a serious problem that is an emergency. Do not wait to see if the symptoms will go away. Get medical help right away. Call your local emergency services (911 in the [place] Do not drive yourself to the hospital. Summary Bradycardia is a slower-than-normal heartbeat. With bradycardia, the resting heart rate is less than 60 beats per minute. Treatment for this condition depends on the cause. Manage any health conditions that contribute to bradycardia as told by your health care provider. Do not use any products that contain nicotine or tobacco. These products include cigarettes, chewing tobacco, and vaping devices, such as e-cigarettes. Keep all follow-up visits. This is important. This information is not intended to replace advice given to you by your health care provider. Make sure you discuss any questions you have with your health care provider. Document Revised: 10/17/2020 Document Reviewed: 10/17/2020 Elsevier Patient Education  2024 [employer].

## 2024-05-16 NOTE — Progress Notes (Signed)
 Subjective:  Patient ID: Clarence Ware, male    DOB: [birth year]  Age: 73 y.o. MRN: [id number]  CC: light headedness (Ongoing and has been really bad the last couple weeks. )   HPI Clarence Ware presents for f/up ----  Discussed the use of AI scribe software for clinical note transcription with the patient, who gave verbal consent to proceed.  History of Present Illness Clarence Ware is a 73 year old male who presents with lightheadedness and chest tightness.  He has been experiencing intermittent lightheadedness for some time. During gym sessions, he felt lightheaded after every exercise set, which was unusual compared to previous experiences where it occurred only once per session.  Yesterday, he experienced chest tightness along with lightheadedness after completing exercise sets. The chest tightness occurs at rest and is not associated with shortness of breath. This is a new symptom for him.  He has been monitoring his blood pressure and reports it being unusually low on several occasions. On Monday, his blood pressure was 73/55 sitting and 70/50 standing. Today, it was 107/71 sitting and 80/60 standing. He typically does not experience a change in blood pressure upon standing. His heart rate is usually in the low 60s to high 50s, but today it was noted to be 86, 71, and 75.  No new trouble with vision or hearing, nausea, vomiting, slurred speech, or ear pain. He reports numbness in his left index finger this morning but nowhere else. He reports a longstanding issue with balance but does not feel it has worsened recently.     Outpatient Medications Prior to Visit  Medication Sig Dispense Refill   alfuzosin  (UROXATRAL ) 10 MG 24 hr tablet TAKE 1 TABLET BY MOUTH EVERY MORNING 90 tablet 1   celecoxib  (CELEBREX ) 100 MG capsule Take 1 capsule (100 mg total) by mouth 2 (two) times daily as needed. 90 capsule 1   cetirizine (ZYRTEC) 10 MG tablet Take 10 mg by mouth as  directed. PRN (Patient taking differently: Take 10 mg by mouth as needed for allergies. PRN)     Cholecalciferol (VITAMIN D ) 2000 units CAPS Take 1 capsule by mouth daily.     clonazePAM  (KLONOPIN ) 0.5 MG tablet TAKE A HALF TABLET BY MOUTH EVERY NIGHT AT BEDTIME AS NEEDED FOR ANXIETY 45 tablet 0   docusate sodium (COLACE) 50 MG capsule Take 100 mg by mouth daily as needed for mild constipation or moderate constipation.     Evolocumab  (REPATHA  SURECLICK) 140 MG/ML SOAJ Inject 140 mg into the skin every 14 (fourteen) days. 6 mL 3   JARDIANCE  10 MG TABS tablet TAKE 1 TABLET BY MOUTH DAILY BEFORE BREAKFAST 90 tablet 0   MYRBETRIQ 50 MG TB24 tablet Take 50 mg by mouth daily.     rosuvastatin  (CRESTOR ) 20 MG tablet Take 1 tablet (20 mg total) by mouth daily. 90 tablet 3   aspirin  EC 81 MG tablet Take 81 mg by mouth daily.     melatonin 3 MG TABS tablet Take 2 tablets (6 mg total) by mouth at bedtime. 180 tablet 1   No facility-administered medications prior to visit.    ROS Review of Systems  Constitutional:  Negative for appetite change, chills, diaphoresis, fatigue and fever.  HENT:  Positive for hearing loss. Negative for facial swelling and tinnitus.   Eyes:  Negative for visual disturbance.  Respiratory:  Positive for chest tightness. Negative for shortness of breath and wheezing.   Cardiovascular:  Negative for chest pain, palpitations and leg  swelling.  Gastrointestinal:  Negative for abdominal pain, constipation, diarrhea, nausea and vomiting.  Endocrine: Negative.   Genitourinary: Negative.  Negative for difficulty urinating and dysuria.  Musculoskeletal:  Positive for arthralgias and gait problem. Negative for myalgias.  Skin: Negative.   Neurological:  Positive for dizziness and light-headedness. Negative for seizures, syncope, facial asymmetry, speech difficulty, weakness, numbness and headaches.  Hematological:  Negative for adenopathy. Does not bruise/bleed easily.   Psychiatric/Behavioral: Negative.      Objective:  BP 138/82 (BP Location: Left Arm, Patient Position: Sitting, Cuff Size: Normal) Comment: BP (R) 134/78  Pulse (!) 55   Temp 98 F (36.7 C) (Oral)   Resp 16   Ht 6' 2 (1.88 m)   Wt 198 lb 9.6 oz (90.1 kg)   SpO2 98%   BMI 25.50 kg/m   BP Readings from Last 3 Encounters:  05/16/24 138/82  04/18/24 122/70  04/11/24 122/88    Wt Readings from Last 3 Encounters:  05/16/24 198 lb 9.6 oz (90.1 kg)  04/18/24 193 lb (87.5 kg)  04/11/24 196 lb (88.9 kg)    Physical Exam Vitals reviewed.  Constitutional:      Appearance: Normal appearance.  HENT:     Right Ear: Decreased hearing noted. No middle ear effusion. There is no impacted cerumen. Tympanic membrane is not injected.     Left Ear: Decreased hearing noted.  No middle ear effusion. There is no impacted cerumen. Tympanic membrane is not injected.     Mouth/Throat:     Mouth: Mucous membranes are moist.  Eyes:     General: No scleral icterus.    Conjunctiva/sclera: Conjunctivae normal.  Cardiovascular:     Rate and Rhythm: Regular rhythm. Bradycardia present.     Heart sounds: Murmur heard.     Systolic murmur is present with a grade of 2/6.     No diastolic murmur is present.     No friction rub. No gallop.     Comments: EKG--- SB with 1st degree AV block (new), 53 bpm Anterior/septal infarct pattern is not new No LVH No acute ST/T wave changes Pulmonary:     Effort: Pulmonary effort is normal.     Breath sounds: No stridor. No wheezing, rhonchi or rales.  Abdominal:     General: Abdomen is flat.     Palpations: There is no mass.     Tenderness: There is no abdominal tenderness. There is no guarding.     Hernia: No hernia is present.  Musculoskeletal:        General: Normal range of motion.     Cervical back: Neck supple.     Right lower leg: No edema.     Left lower leg: No edema.  Lymphadenopathy:     Cervical: No cervical adenopathy.  Skin:    General:  Skin is warm and dry.  Neurological:     Mental Status: He is alert. Mental status is at baseline.     Cranial Nerves: Cranial nerves 2-12 are intact.     Sensory: Sensation is intact.     Motor: Motor function is intact. No weakness, atrophy or seizure activity.     Coordination: Coordination is intact. Romberg sign negative.     Gait: Gait is intact.     Deep Tendon Reflexes: Reflexes normal. Babinski sign absent on the right side. Babinski sign absent on the left side.     Reflex Scores:      Tricep reflexes are 1+ on the right  side and 1+ on the left side.      Bicep reflexes are 1+ on the right side and 1+ on the left side.      Brachioradialis reflexes are 1+ on the right side and 1+ on the left side.      Patellar reflexes are 2+ on the right side and 2+ on the left side.      Achilles reflexes are 1+ on the right side and 1+ on the left side. Psychiatric:        Mood and Affect: Mood normal.        Behavior: Behavior normal.     Lab Results  Component Value Date   WBC 6.8 04/02/2024   HGB 15.3 04/02/2024   HCT 46.4 04/02/2024   PLT 197.0 04/02/2024   GLUCOSE 80 05/16/2024   CHOL 101 12/11/2023   TRIG 46 12/11/2023   HDL 55 12/11/2023   LDLCALC 34 12/11/2023   ALT 27 10/01/2023   AST 24 10/01/2023   NA 141 05/16/2024   K 4.1 05/16/2024   CL 104 05/16/2024   CREATININE 0.94 05/16/2024   BUN 24 (H) 05/16/2024   CO2 29 05/16/2024   TSH 1.07 04/02/2024   PSA 2.79 04/04/2023   HGBA1C 6.8 (H) 04/02/2024   MICROALBUR <0.7 05/16/2024    No results found.  Assessment & Plan:  Diabetes mellitus due to underlying condition with diabetic neuropathy, without long-term current use of insulin (HCC) -     Basic metabolic panel with GFR; Future -     Microalbumin / creatinine urine ratio; Future -     Urinalysis, Routine w reflex microscopic; Future -     HM Diabetes Foot Exam -     Cortisol; Future  Essential hypertension- BP is well controlled. -     Basic metabolic  panel with GFR; Future -     Urinalysis, Routine w reflex microscopic; Future -     Cortisol; Future  Symptomatic bradycardia -     Cortisol; Future -     LONG TERM MONITOR (3-14 DAYS); Future  Chest tightness- Will treat for stable angina. -     Brain natriuretic peptide; Future -     Troponin I (High Sensitivity); Future -     D-dimer, quantitative; Future -     CT CORONARY MORPH W/CTA COR W/SCORE W/CA W/CM &/OR WO/CM; Future  Murmur, cardiac -     ECHOCARDIOGRAM COMPLETE; Future  Elevated brain natriuretic peptide (BNP) level -     ECHOCARDIOGRAM COMPLETE; Future  Stable angina -     Aspirin  EC; Take 1 tablet (81 mg total) by mouth daily.  Dispense: 90 tablet; Refill: 0 -     Nitroglycerin ; Place 1 tablet (0.4 mg total) under the tongue every 5 (five) minutes as needed for chest pain.  Dispense: 50 tablet; Refill: 3     Follow-up: Return in about 3 months (around 08/16/2024).  Debby Molt, MD

## 2024-05-16 NOTE — Progress Notes (Unsigned)
 EP to read.

## 2024-05-17 ENCOUNTER — Encounter: Payer: Self-pay | Admitting: Internal Medicine

## 2024-05-17 ENCOUNTER — Ambulatory Visit: Payer: Self-pay | Admitting: Internal Medicine

## 2024-05-17 DIAGNOSIS — I2089 Other forms of angina pectoris: Secondary | ICD-10-CM | POA: Insufficient documentation

## 2024-05-17 DIAGNOSIS — R011 Cardiac murmur, unspecified: Secondary | ICD-10-CM | POA: Insufficient documentation

## 2024-05-17 DIAGNOSIS — R7989 Other specified abnormal findings of blood chemistry: Secondary | ICD-10-CM | POA: Insufficient documentation

## 2024-05-17 MED ORDER — ASPIRIN EC 81 MG PO TBEC: 81.0000 mg | DELAYED_RELEASE_TABLET | Freq: Every day | ORAL | 0 refills | Status: AC

## 2024-05-17 MED ORDER — NITROGLYCERIN 0.4 MG SL SUBL: 0.4000 mg | SUBLINGUAL_TABLET | SUBLINGUAL | 3 refills | Status: AC | PRN

## 2024-05-19 ENCOUNTER — Encounter: Payer: Self-pay | Admitting: Internal Medicine

## 2024-05-19 ENCOUNTER — Ambulatory Visit: Attending: Neurology

## 2024-05-19 DIAGNOSIS — R471 Dysarthria and anarthria: Secondary | ICD-10-CM | POA: Diagnosis not present

## 2024-05-19 NOTE — Therapy (Signed)
 OUTPATIENT SPEECH LANGUAGE PATHOLOGY TREATMENT   Patient Name: Clarence Ware MRN: [id number] DOB:1951-06-24, 73 y.o., male Today's Date: 05/19/2024  PCP: Clarence Ned, MD REFERRING PROVIDER: Dohmeier, Dedra, MD  END OF SESSION:  End of Session - 05/19/24 0851     Visit Number 2    Number of Visits 9    Date for Recertification  07/04/24    SLP Start Time 0803    SLP Stop Time  0845    SLP Time Calculation (min) 42 min    Activity Tolerance Patient tolerated treatment well           Past Medical History:  Diagnosis Date   Allergy    First degree heart block    Hyperlipidemia    Hypertension    REM sleep behavior disorder    Substance abuse Clarence Ware ([place]))    Past Surgical History:  Procedure Laterality Date   INGUINAL HERNIA REPAIR Right 03/10/2015   Procedure: OPEN RIGHT INGUINAL HERNIA REPAIR;  Surgeon: Clarence Lunger, MD;  Location: [facility];  Service: General;  Laterality: Right;  With MESH   VASECTOMY     Patient Active Problem List   Diagnosis Date Noted   Murmur, cardiac 05/17/2024   Elevated brain natriuretic peptide (BNP) level 05/17/2024   Stable angina 05/17/2024   Bradycardia 05/16/2024   Symptomatic bradycardia 05/16/2024   Chest tightness 05/16/2024   Impacted cerumen of right ear 04/18/2024   Degenerative tear of medial meniscus of right knee 04/11/2024   Diabetes mellitus due to underlying condition with diabetic neuropathy, without long-term current use of insulin (HCC) 04/03/2024   Chronic idiopathic constipation 04/02/2024   Need for immunization against influenza 04/02/2024   Encounter for general adult medical examination with abnormal findings 04/02/2024   Herniated lumbar disc without myelopathy 12/07/2023   High serum lipoprotein(a) 10/11/2023   BPH associated with nocturia 04/04/2023   Chronic heart failure with preserved ejection fraction (HFpEF) (HCC) 10/14/2022   Need for vaccination 09/12/2022   Sleep related bruxism 08/24/2022   UARS  (upper airway resistance syndrome) 08/24/2022   Primary osteoarthritis of both knees 12/13/2021   Cerebellar ataxia in diseases classified elsewhere (HCC) 09/01/2021   Seborrheic keratosis 09/01/2021   BPH with urinary obstruction 09/01/2021   Age-related cognitive decline 08/05/2020   Uncontrolled REM sleep behavior disorder 03/16/2020   Essential hypertension 12/18/2014   Hyperlipidemia with target LDL less than 70 12/18/2014   First degree heart block 03/30/2013   GERD 06/26/2008   ERECTILE DYSFUNCTION, MILD 01/30/2008   COLON POLYP 09/06/2006    Onset date: 6 months   REFERRING DIAG: R49.0 (ICD-10-CM) - Dysphonia R43.0 (ICD-10-CM) - Anosmia G47.52 (ICD-10-CM) - Sleep behavior disorder, REM G20.A1 (ICD-10-CM) - Parkinson's disease, unspecified whether dyskinesia present, unspecified whether manifestations fluctuate (HCC)  THERAPY DIAG:  Dysarthria and anarthria  Rationale for Evaluation and Treatment: Rehabilitation  SUBJECTIVE:   SUBJECTIVE STATEMENT: I have an ENT appointment.'   Pt accompanied by: self  PERTINENT HISTORY: Recently pt has had more difficutly with softer speech. Has new hearing aids as of last week.   PAIN:  Are you having pain? No  FALLS: Has patient fallen in last 6 months? Yes, Number of falls: 1  LIVING ENVIRONMENT: Lives with: lives with their spouse Lives in: House/apartment  PLOF:Level of assistance: Independent with ADLs, Independent with IADLs Employment: Retired  PATIENT GOALS: Speak so I can be heard.  OBJECTIVE:  Note: Objective measures were completed at Evaluation unless otherwise noted.   PATIENT REPORTED OUTCOME MEASURES (  PROM): Communication Effectiveness Survey: to be provided first 1-2 sessions.                                                                                                                             TREATMENT DATE:   05/19/24: SLP guided pt through practice with more intent and purpose when speaking  in order to incr pt's habitual speech volume. PT had focused on more intent with speech for approx 80% in first 3 mintues conversation in the speech room, average 70dB. He read paragraphs with average 71dB, SLP used auditory cues to prove to pt he was not shouting. In sentence responses with some restrictions (sentences with phrases and two-word combinations) pt used average WNL volume. In multisentence responses pt had WNL volume approx 85% of the time; Self awareness of decr'd volume improved over the session. In monologues of approx one minute, pt maintained WNL volume average 75% of the time. SLP provided homework for pt and told pt to complete 5 days/week for 20 minutes, or 4 days/week for 2 15-minute practice periods. Some but minimal carryover notable to conversation following practice today.  04/28/24: SLP guided pt through diagnostic therapy session where Clarence Ware spoke in  monologues with average 72dB, within normal limits volume. SLP talked with pt about the therapy course. Pt preferred x1/week and SLP preferred x1-2/week; agreed on x1/week. SLP Explained to pt rationale for ENT evaluation.    PATIENT EDUCATION: Education details: see Treatment date above Person educated: Patient Education method: Explanation Education comprehension: verbalized understanding and needs further education  HOME EXERCISE PROGRAM: Nothing currently;Will eventually begin to practice louder speech  GOALS: Goals reviewed with patient? No  SHORT TERM GOALS: Target date: 06/06/24  Pt will demo average WNL volume with 18/20 sentences Baseline: Goal status: INITIAL  2.  Pt will practice home tasks 4/7 days/week for 2 weeks Baseline:  Goal status: INITIAL   LONG TERM GOALS: Target date: 1226/25  Pt will improve PROM Baseline:  Goal status: INITIAL  2.  Pt will report wife does not ask him to repeat at the frequency she was prior to ST Baseline:  Goal status: INITIAL  3.  Clarence Ware will produce average  WNL speech volume in 10 minutes conversation in 2 sessions Baseline:  Goal status: INITIAL  ASSESSMENT:  CLINICAL IMPRESSION: Patient is a 73 y.o. M who was seen today for treatment of voice volume due to wife and others telling him he has a softer voice than he used to have. ENT eval is scheduled for 06/09/24. See treatment date above for today's date for further details on today's session. Pt is in a yearly REM behavior sleep disorder study in [place] each year, and PD is a diagnosis that is possible for him at this time, but no formal diagnosis has been made.  Based upon diagnostic therapy today pt would be a good candidate for skilled ST. Pt's plan of care may be modified based on results of  ENT eval.  OBJECTIVE IMPAIRMENTS: include voice disorder. These impairments are limiting patient from household responsibilities, ADLs/IADLs, and effectively communicating at home and in community. Factors affecting potential to achieve goals and functional outcome are none noted.. Patient will benefit from skilled SLP services to address above impairments and improve overall function.  REHAB POTENTIAL: Good  PLAN:  SLP FREQUENCY: 1-2x/week however pt prefers x1/week  SLP DURATION: 8 weeks  PLANNED INTERVENTIONS: Internal/external aids, Oral motor exercises, Functional tasks, SLP instruction and feedback, Compensatory strategies, Patient/family education, and [id number] Treatment of speech (30 or 45 min)     Greer Wainright, CCC-SLP 05/19/2024, 8:51 AM

## 2024-05-20 ENCOUNTER — Encounter (HOSPITAL_COMMUNITY): Payer: Self-pay

## 2024-05-22 ENCOUNTER — Other Ambulatory Visit: Payer: Self-pay | Admitting: Cardiology

## 2024-05-22 ENCOUNTER — Ambulatory Visit (HOSPITAL_COMMUNITY)
Admission: RE | Admit: 2024-05-22 | Discharge: 2024-05-22 | Disposition: A | Discharge status: Home / Self Care | Source: Ambulatory Visit | Attending: Cardiology | Admitting: Cardiology

## 2024-05-22 ENCOUNTER — Ambulatory Visit (HOSPITAL_BASED_OUTPATIENT_CLINIC_OR_DEPARTMENT_OTHER)
Admission: RE | Admit: 2024-05-22 | Discharge: 2024-05-22 | Disposition: A | Discharge status: Home / Self Care | Source: Ambulatory Visit | Attending: Cardiology | Admitting: Cardiology

## 2024-05-22 DIAGNOSIS — R0789 Other chest pain: Secondary | ICD-10-CM | POA: Insufficient documentation

## 2024-05-22 DIAGNOSIS — N401 Enlarged prostate with lower urinary tract symptoms: Secondary | ICD-10-CM | POA: Diagnosis not present

## 2024-05-22 DIAGNOSIS — R350 Frequency of micturition: Secondary | ICD-10-CM | POA: Diagnosis not present

## 2024-05-22 DIAGNOSIS — I2510 Atherosclerotic heart disease of native coronary artery without angina pectoris: Secondary | ICD-10-CM | POA: Diagnosis not present

## 2024-05-22 DIAGNOSIS — R351 Nocturia: Secondary | ICD-10-CM | POA: Diagnosis not present

## 2024-05-22 DIAGNOSIS — R931 Abnormal findings on diagnostic imaging of heart and coronary circulation: Secondary | ICD-10-CM | POA: Insufficient documentation

## 2024-05-22 DIAGNOSIS — R3915 Urgency of urination: Secondary | ICD-10-CM | POA: Diagnosis not present

## 2024-05-22 DIAGNOSIS — Z125 Encounter for screening for malignant neoplasm of prostate: Secondary | ICD-10-CM | POA: Diagnosis not present

## 2024-05-22 DIAGNOSIS — R3912 Poor urinary stream: Secondary | ICD-10-CM | POA: Diagnosis not present

## 2024-05-22 MED ORDER — IOHEXOL 350 MG/ML SOLN
100.0000 mL | Freq: Once | INTRAVENOUS | Status: AC | PRN
  Administered 2024-05-22: 100 mL via INTRAVENOUS

## 2024-05-22 MED ORDER — NITROGLYCERIN 0.4 MG SL SUBL
0.8000 mg | SUBLINGUAL_TABLET | Freq: Once | SUBLINGUAL | Status: AC
  Administered 2024-05-22: 0.8 mg via SUBLINGUAL

## 2024-05-22 NOTE — Progress Notes (Unsigned)
 [facility] [address] [place] [address] Phone: [phone] Subjective:   Clarence Ware, am serving as a scribe for Dr. Arthea Claudene.  I'm seeing this patient by the request  of:  Clarence Debby CROME, MD  CC: Right knee pain  YEP:Dlagzrupcz  04/11/2024 Degenerative tearing noted, patient does have some displacement of approximately 50% of the medial compartment.  Ultrasound does show some more progression of the arthritis and the x-rays had shown.  Seems to be the patellofemoral and the medial joint space.  Injection given today, tolerated the procedure well discussed postinjection protocol.  Discussed home exercises and VMO strengthening and follow-up again 6 to 12 weeks      Update 05/23/2024 Clarence Ware is a 73 y.o. male coming in with complaint of R knee pain. Patient states dong better overall. Moving in the right direction. Climbing steps is the main problem and also getting off the floor is difficult. SOB but working with other doctors on that.       Past Medical History:  Diagnosis Date   Allergy    First degree heart block    Hyperlipidemia    Hypertension    REM sleep behavior disorder    Substance abuse [facility])    Past Surgical History:  Procedure Laterality Date   INGUINAL HERNIA REPAIR Right 03/10/2015   Procedure: OPEN RIGHT INGUINAL HERNIA REPAIR;  Surgeon: Clarence Lunger, MD;  Location: [facility];  Service: General;  Laterality: Right;  With MESH   VASECTOMY     Social History   Socioeconomic History   Marital status: Married    Spouse name: Clarence Ware   Number of children: 2   Years of education: Not on file   Highest education level: Professional school degree (e.g., MD, DDS, DVM, JD)  Occupational History   Occupation: Pharmacist part-time  Tobacco Use   Smoking status: Former    Current packs/day: 0.00    Average packs/day: 0.3 packs/day for 1 year (0.3 ttl pk-yrs)    Types: Cigarettes    Start date: 07/11/1991     Quit date: 07/10/1992    Years since quitting: 31.8   Smokeless tobacco: Never  Vaping Use   Vaping status: Never Used  Substance and Sexual Activity   Alcohol use: No    Alcohol/week: 0.0 standard drinks of alcohol   Drug use: Not Currently    Comment: sobrity for 10 years   Sexual activity: Yes  Other Topics Concern   Not on file  Social History Narrative   Married. Education: [employer].   Exercise: some   Caffeine- coffee 2 c daily      Lives with wife.   Social Drivers of Corporate Investment Banker Strain: Low Risk  (03/29/2024)   Overall Financial Resource Strain (CARDIA)    Difficulty of Paying Living Expenses: Not hard at all  Food Insecurity: No Food Insecurity (03/29/2024)   Hunger Vital Sign    Worried About Running Out of Food in the Last Year: Never true    Ran Out of Food in the Last Year: Never true  Transportation Needs: No Transportation Needs (03/29/2024)   PRAPARE - Administrator, Civil Service (Medical): No    Lack of Transportation (Non-Medical): No  Physical Activity: Sufficiently Active (03/29/2024)   Exercise Vital Sign    Days of Exercise per Week: 5 days    Minutes of Exercise per Session: 40 min  Stress: No Stress Concern Present (03/29/2024)  [employer] of Occupational Health - Occupational Stress Questionnaire    Feeling of Stress: Not at all  Social Connections: Socially Integrated (03/29/2024)   Social Connection and Isolation Panel    Frequency of Communication with Friends and Family: More than three times a week    Frequency of Social Gatherings with Friends and Family: More than three times a week    Attends Religious Services: 1 to 4 times per year    Active Member of [employer] or Organizations: Yes    Attends Engineer, Structural: More than 4 times per year    Marital Status: Married   Allergies  Allergen Reactions   Nexium [Esomeprazole Magnesium]     Awful regurgitation   Family History  Problem Relation  Age of Onset   Heart disease Mother    Stroke Mother    Heart disease Father 87       MI   Hypertension Father    Hypertension Brother    Cancer Brother    Diabetes Brother    Heart disease Brother    Hyperlipidemia Brother    Cancer Brother    Hyperlipidemia Sister    Coronary artery disease Brother 59       CABG   Colon cancer Paternal Grandmother     Current Outpatient Medications (Endocrine & Metabolic):    JARDIANCE  10 MG TABS tablet, TAKE 1 TABLET BY MOUTH DAILY BEFORE BREAKFAST  Current Outpatient Medications (Cardiovascular):    Evolocumab  (REPATHA  SURECLICK) 140 MG/ML SOAJ, Inject 140 mg into the skin every 14 (fourteen) days.   nitroGLYCERIN  (NITROSTAT ) 0.4 MG SL tablet, Place 1 tablet (0.4 mg total) under the tongue every 5 (five) minutes as needed for chest pain.   rosuvastatin  (CRESTOR ) 20 MG tablet, Take 1 tablet (20 mg total) by mouth daily.  Current Outpatient Medications (Respiratory):    cetirizine (ZYRTEC) 10 MG tablet, Take 10 mg by mouth as directed. PRN (Patient taking differently: Take 10 mg by mouth as needed for allergies. PRN)  Current Outpatient Medications (Analgesics):    aspirin  EC 81 MG tablet, Take 1 tablet (81 mg total) by mouth daily.   celecoxib  (CELEBREX ) 100 MG capsule, Take 1 capsule (100 mg total) by mouth 2 (two) times daily as needed.   Current Outpatient Medications (Other):    alfuzosin  (UROXATRAL ) 10 MG 24 hr tablet, TAKE 1 TABLET BY MOUTH EVERY MORNING   Cholecalciferol (VITAMIN D ) 2000 units CAPS, Take 1 capsule by mouth daily.   clonazePAM  (KLONOPIN ) 0.5 MG tablet, TAKE A HALF TABLET BY MOUTH EVERY NIGHT AT BEDTIME AS NEEDED FOR ANXIETY   docusate sodium (COLACE) 50 MG capsule, Take 100 mg by mouth daily as needed for mild constipation or moderate constipation.   MYRBETRIQ 50 MG TB24 tablet, Take 50 mg by mouth daily.   Reviewed prior external information including notes and imaging from  primary care provider As well as notes  that were available from care everywhere and other healthcare systems.  Past medical history, social, surgical and family history all reviewed in electronic medical record.  No pertanent information unless stated regarding to the chief complaint.   Review of Systems:  No headache, visual changes, nausea, vomiting, diarrhea, constipation, dizziness, abdominal pain, skin rash, fevers, chills, night sweats, weight loss, swollen lymph nodes, body aches, joint swelling, chest pain, shortness of breath, mood changes. POSITIVE muscle aches  Objective  Blood pressure 126/84, pulse 64, height 6' 2 (1.88 m), weight 195 lb (88.5 kg), SpO2 98%.  General: No apparent distress alert and oriented x3 mood and affect normal, dressed appropriately.  HEENT: Pupils equal, extraocular movements intact  Respiratory: Patient's speak in full sentences and does not appear short of breath  Cardiovascular: No lower extremity edema, non tender, no erythema  Right knee still has some tenderness to palpation over the medial joint line.  Still some mild positive McMurray's noted.  Limited muscular skeletal ultrasound was performed and interpreted by CLAUDENE HUSSAR, M  Limited ultrasound shows that there is improvement in the displacement of the meniscus but still 20% still noted.  Patient also has some narrowing of the medial joint line noted. Impression: Medial meniscus tear but improvement.    Impression and Recommendations:     The above documentation has been reviewed and is accurate and complete Kaylanie Capili M Sarely Stracener, DO

## 2024-05-23 ENCOUNTER — Other Ambulatory Visit: Payer: Self-pay

## 2024-05-23 ENCOUNTER — Ambulatory Visit: Payer: Self-pay | Admitting: Internal Medicine

## 2024-05-23 ENCOUNTER — Encounter: Payer: Self-pay | Admitting: Family Medicine

## 2024-05-23 ENCOUNTER — Ambulatory Visit: Admitting: Family Medicine

## 2024-05-23 VITALS — BP 126/84 | HR 64 | Ht 74.0 in | Wt 195.0 lb

## 2024-05-23 DIAGNOSIS — M23203 Derangement of unspecified medial meniscus due to old tear or injury, right knee: Secondary | ICD-10-CM | POA: Diagnosis not present

## 2024-05-23 DIAGNOSIS — G8929 Other chronic pain: Secondary | ICD-10-CM | POA: Diagnosis not present

## 2024-05-23 DIAGNOSIS — M25561 Pain in right knee: Secondary | ICD-10-CM

## 2024-05-23 NOTE — Assessment & Plan Note (Addendum)
 Improvement noted but still some displacement noted.  We discussed with patient about icing regimen and home exercises, increase activity slowly.  Follow-up again in 2 months.  If any worsening pain or instability we will need to consider the possibility advanced imaging.

## 2024-05-26 ENCOUNTER — Ambulatory Visit

## 2024-05-26 DIAGNOSIS — R471 Dysarthria and anarthria: Secondary | ICD-10-CM | POA: Diagnosis not present

## 2024-05-26 NOTE — Therapy (Addendum)
 OUTPATIENT SPEECH LANGUAGE PATHOLOGY TREATMENT   Patient Name: Clarence Ware MRN: [id number] DOB:August 04, 1950, 73 y.o., male Today's Date: 05/26/2024  PCP: Joshua Ned, MD REFERRING PROVIDER: Dohmeier, Dedra, MD  END OF SESSION:  End of Session - 05/26/24 0803     Visit Number 3    Number of Visits 9    Date for Recertification  07/04/24    SLP Start Time 0802    SLP Stop Time  0841    SLP Time Calculation (min) 39 min    Activity Tolerance Patient tolerated treatment well           Past Medical History:  Diagnosis Date   Allergy    First degree heart block    Hyperlipidemia    Hypertension    REM sleep behavior disorder    Substance abuse [facility])    Past Surgical History:  Procedure Laterality Date   INGUINAL HERNIA REPAIR Right 03/10/2015   Procedure: OPEN RIGHT INGUINAL HERNIA REPAIR;  Surgeon: Donnice Lunger, MD;  Location: [facility];  Service: General;  Laterality: Right;  With MESH   VASECTOMY     Patient Active Problem List   Diagnosis Date Noted   Murmur, cardiac 05/17/2024   Elevated brain natriuretic peptide (BNP) level 05/17/2024   Stable angina 05/17/2024   Bradycardia 05/16/2024   Symptomatic bradycardia 05/16/2024   Chest tightness 05/16/2024   Impacted cerumen of right ear 04/18/2024   Degenerative tear of medial meniscus of right knee 04/11/2024   Diabetes mellitus due to underlying condition with diabetic neuropathy, without long-term current use of insulin (HCC) 04/03/2024   Chronic idiopathic constipation 04/02/2024   Need for immunization against influenza 04/02/2024   Encounter for general adult medical examination with abnormal findings 04/02/2024   Herniated lumbar disc without myelopathy 12/07/2023   High serum lipoprotein(a) 10/11/2023   BPH associated with nocturia 04/04/2023   Chronic heart failure with preserved ejection fraction (HFpEF) (HCC) 10/14/2022   Need for vaccination 09/12/2022   Sleep related bruxism 08/24/2022   UARS  (upper airway resistance syndrome) 08/24/2022   Primary osteoarthritis of both knees 12/13/2021   Cerebellar ataxia in diseases classified elsewhere (HCC) 09/01/2021   Seborrheic keratosis 09/01/2021   BPH with urinary obstruction 09/01/2021   Age-related cognitive decline 08/05/2020   Uncontrolled REM sleep behavior disorder 03/16/2020   Essential hypertension 12/18/2014   Hyperlipidemia with target LDL less than 70 12/18/2014   First degree heart block 03/30/2013   GERD 06/26/2008   ERECTILE DYSFUNCTION, MILD 01/30/2008   COLON POLYP 09/06/2006    Onset date: 6 months   REFERRING DIAG: R49.0 (ICD-10-CM) - Dysphonia R43.0 (ICD-10-CM) - Anosmia G47.52 (ICD-10-CM) - Sleep behavior disorder, REM G20.A1 (ICD-10-CM) - Parkinson's disease, unspecified whether dyskinesia present, unspecified whether manifestations fluctuate (HCC)  THERAPY DIAG:  Dysarthria and anarthria  Rationale for Evaluation and Treatment: Rehabilitation  SUBJECTIVE:   SUBJECTIVE STATEMENT: It's hard to talk for 15 minutes straight.'   Pt accompanied by: self  PERTINENT HISTORY: Recently pt has had more difficutly with softer speech. Has new hearing aids as of last week.   PAIN:  Are you having pain? Yes: NPRS scale: 2/10 Pain location: lt middle finger Pain description: sore Aggravating factors: when I touch it to something Relieving factors: time  FALLS: Has patient fallen in last 6 months? Yes, Number of falls: 1  LIVING ENVIRONMENT: Lives with: lives with their spouse Lives in: House/apartment  PLOF:Level of assistance: Independent with ADLs, Independent with IADLs Employment: Retired  PATIENT GOALS:  Speak so I can be heard.  OBJECTIVE:  Note: Objective measures were completed at Evaluation unless otherwise noted.   PATIENT REPORTED OUTCOME MEASURES (PROM): Communication Effectiveness Survey: provided 05/1724; pt scored himself 25/32 with higher scores indicating less of an impact on pt's  QoL. Lowest score was 2 for being a part of a conversation in a noisy environment.                                                                                                                            TREATMENT DATE:   05/26/24: Pt practiced about 3 days since last session. I can tell when I'm not loud enough. Pt states when he knows he is not shouting then he is not speaking loudly enough. Pt's speech in first 9 minutes of conversation was WNL.Next 10 minutes was average low-WNL. In next 8 minutes pt told SLP he knew he was louder and he was just that - average 71dB. SLP and pt walked to the front of the building, outside, and SLP judged pt's loudness which remained WNL. Pt filled out CES, with results above. [name] reported today that after 8-10 minutes of conversation he is dyspneic. SLP told [name] to give that info to ENT.  05/19/24: SLP guided pt through practice with more intent and purpose when speaking in order to incr pt's habitual speech volume. PT had focused on more intent with speech for approx 80% in first 3 mintues conversation in the speech room, average 70dB. He read paragraphs with average 71dB, SLP used auditory cues to prove to pt he was not shouting. In sentence responses with some restrictions (sentences with phrases and two-word combinations) pt used average WNL volume. In multisentence responses pt had WNL volume approx 85% of the time; Self awareness of decr'd volume improved over the session. In monologues of approx one minute, pt maintained WNL volume average 75% of the time. SLP provided homework for pt and told pt to complete 5 days/week for 20 minutes, or 4 days/week for 2 15-minute practice periods. Some but minimal carryover notable to conversation following practice today.  04/28/24: SLP guided pt through diagnostic therapy session where [name] spoke in  monologues with average 72dB, within normal limits volume. SLP talked with pt about the therapy course. Pt preferred  x1/week and SLP preferred x1-2/week; agreed on x1/week. SLP Explained to pt rationale for ENT evaluation.    PATIENT EDUCATION: Education details: see Treatment date above Person educated: Patient Education method: Explanation Education comprehension: verbalized understanding and needs further education  HOME EXERCISE PROGRAM: Nothing currently;Will eventually begin to practice louder speech  GOALS: Goals reviewed with patient? No  SHORT TERM GOALS: Target date: 06/06/24  Pt will demo average WNL volume with 18/20 sentences Baseline: Goal status: INITIAL  2.  Pt will practice home tasks 4/7 days/week for 2 weeks Baseline:  Goal status: INITIAL   LONG TERM GOALS: Target date: 1226/25  Pt will improve PROM Baseline:  Goal  status: INITIAL  2.  Pt will report wife does not ask him to repeat at the frequency she was prior to ST Baseline:  Goal status: INITIAL  3.  [name] will produce average WNL speech volume in 10 minutes conversation in 2 sessions Baseline:  Goal status: INITIAL  ASSESSMENT:  CLINICAL IMPRESSION: Patient is a 73 y.o. M who was seen today for treatment of voice volume due to wife and others telling him he has a softer voice than he used to have. ENT eval is scheduled for 06/09/24. See treatment date above for today's date for further details on today's session. Pt is in a yearly REM behavior sleep disorder study in [place] each year, and PD is a diagnosis that is possible for him at this time, but no formal diagnosis has been made.  Based upon diagnostic therapy today pt would be a good candidate for skilled ST. Pt's plan of care may be modified based on results of ENT eval.  OBJECTIVE IMPAIRMENTS: include voice disorder. These impairments are limiting patient from household responsibilities, ADLs/IADLs, and effectively communicating at home and in community. Factors affecting potential to achieve goals and functional outcome are none noted.. Patient  will benefit from skilled SLP services to address above impairments and improve overall function.  REHAB POTENTIAL: Good  PLAN:  SLP FREQUENCY: 1-2x/week however pt prefers x1/week  SLP DURATION: 8 weeks  PLANNED INTERVENTIONS: Internal/external aids, Oral motor exercises, Functional tasks, SLP instruction and feedback, Compensatory strategies, Patient/family education, and [id number] Treatment of speech (30 or 45 min)     Romaine Maciolek, CCC-SLP 05/26/2024, 8:48 AM

## 2024-06-03 ENCOUNTER — Ambulatory Visit

## 2024-06-03 ENCOUNTER — Ambulatory Visit: Admitting: Internal Medicine

## 2024-06-03 ENCOUNTER — Telehealth: Payer: Self-pay | Admitting: Pharmacy Technician

## 2024-06-03 NOTE — Telephone Encounter (Signed)
   Pharmacy Patient Advocate Encounter   Received notification from CoverMyMeds that prior authorization for repatha  is required/requested.   Insurance verification completed.   The patient is insured through [employer] [facility] [employer] ADV[employer]AGE/RX [employer].   Per test claim: PA required; PA submitted to above mentioned insurance via Latent Key/confirmation #/EOC [id number] Status is pending

## 2024-06-04 NOTE — Telephone Encounter (Signed)
 Pharmacy Patient Advocate Encounter  Received notification from [employer] that Prior Authorization for repatha  has been APPROVED from 06/03/24 to 06/03/25   PA #/Case ID/Reference #: [id number]

## 2024-06-09 ENCOUNTER — Ambulatory Visit (INDEPENDENT_AMBULATORY_CARE_PROVIDER_SITE_OTHER)

## 2024-06-09 ENCOUNTER — Ambulatory Visit: Attending: Neurology

## 2024-06-09 ENCOUNTER — Encounter (INDEPENDENT_AMBULATORY_CARE_PROVIDER_SITE_OTHER): Payer: Self-pay

## 2024-06-09 VITALS — BP 159/91 | HR 51 | Temp 98.2°F | Ht 74.0 in | Wt 194.0 lb

## 2024-06-09 DIAGNOSIS — K219 Gastro-esophageal reflux disease without esophagitis: Secondary | ICD-10-CM | POA: Diagnosis not present

## 2024-06-09 DIAGNOSIS — J383 Other diseases of vocal cords: Secondary | ICD-10-CM | POA: Diagnosis not present

## 2024-06-09 DIAGNOSIS — R490 Dysphonia: Secondary | ICD-10-CM | POA: Diagnosis not present

## 2024-06-09 DIAGNOSIS — R471 Dysarthria and anarthria: Secondary | ICD-10-CM | POA: Diagnosis present

## 2024-06-09 DIAGNOSIS — G20C Parkinsonism, unspecified: Secondary | ICD-10-CM | POA: Diagnosis not present

## 2024-06-09 DIAGNOSIS — H903 Sensorineural hearing loss, bilateral: Secondary | ICD-10-CM | POA: Diagnosis not present

## 2024-06-09 NOTE — Therapy (Signed)
 OUTPATIENT SPEECH LANGUAGE PATHOLOGY TREATMENT   Patient Name: Clarence Ware MRN: [id number] DOB:12/20/1950, 73 y.o., male Today's Date: 06/09/2024  PCP: Joshua Ned, MD REFERRING PROVIDER: Dohmeier, Dedra, MD  END OF SESSION:  End of Session - 06/09/24 0931     Visit Number 4    Number of Visits 9    Date for Recertification  07/04/24    SLP Start Time 0848    SLP Stop Time  0927    SLP Time Calculation (min) 39 min    Activity Tolerance Patient tolerated treatment well            Past Medical History:  Diagnosis Date   Allergy    First degree heart block    Hyperlipidemia    Hypertension    REM sleep behavior disorder    Substance abuse [facility])    Past Surgical History:  Procedure Laterality Date   INGUINAL HERNIA REPAIR Right 03/10/2015   Procedure: OPEN RIGHT INGUINAL HERNIA REPAIR;  Surgeon: Donnice Lunger, MD;  Location: [facility];  Service: General;  Laterality: Right;  With MESH   VASECTOMY     Patient Active Problem List   Diagnosis Date Noted   Murmur, cardiac 05/17/2024   Elevated brain natriuretic peptide (BNP) level 05/17/2024   Stable angina 05/17/2024   Bradycardia 05/16/2024   Symptomatic bradycardia 05/16/2024   Chest tightness 05/16/2024   Impacted cerumen of right ear 04/18/2024   Degenerative tear of medial meniscus of right knee 04/11/2024   Diabetes mellitus due to underlying condition with diabetic neuropathy, without long-term current use of insulin (HCC) 04/03/2024   Chronic idiopathic constipation 04/02/2024   Need for immunization against influenza 04/02/2024   Encounter for general adult medical examination with abnormal findings 04/02/2024   Herniated lumbar disc without myelopathy 12/07/2023   High serum lipoprotein(a) 10/11/2023   BPH associated with nocturia 04/04/2023   Chronic heart failure with preserved ejection fraction (HFpEF) (HCC) 10/14/2022   Need for vaccination 09/12/2022   Sleep related bruxism 08/24/2022    UARS (upper airway resistance syndrome) 08/24/2022   Primary osteoarthritis of both knees 12/13/2021   Cerebellar ataxia in diseases classified elsewhere (HCC) 09/01/2021   Seborrheic keratosis 09/01/2021   BPH with urinary obstruction 09/01/2021   Age-related cognitive decline 08/05/2020   Uncontrolled REM sleep behavior disorder 03/16/2020   Essential hypertension 12/18/2014   Hyperlipidemia with target LDL less than 70 12/18/2014   First degree heart block 03/30/2013   GERD 06/26/2008   ERECTILE DYSFUNCTION, MILD 01/30/2008   COLON POLYP 09/06/2006    Onset date: 6 months   REFERRING DIAG: R49.0 (ICD-10-CM) - Dysphonia R43.0 (ICD-10-CM) - Anosmia G47.52 (ICD-10-CM) - Sleep behavior disorder, REM G20.A1 (ICD-10-CM) - Parkinson's disease, unspecified whether dyskinesia present, unspecified whether manifestations fluctuate (HCC)  THERAPY DIAG:  Dysarthria and anarthria  Rationale for Evaluation and Treatment: Rehabilitation  SUBJECTIVE:   SUBJECTIVE STATEMENT: If I heard anyone ask me to repeat that was a cue (to use louder voice).'   Pt accompanied by: self  PERTINENT HISTORY: Recently pt has had more difficutly with softer speech. Has new hearing aids as of last week.   PAIN:  Are you having pain? No  FALLS: Has patient fallen in last 6 months? Yes, Number of falls: 1  LIVING ENVIRONMENT: Lives with: lives with their spouse Lives in: House/apartment  PLOF:Level of assistance: Independent with ADLs, Independent with IADLs Employment: Retired  PATIENT GOALS: Speak so I can be heard.  OBJECTIVE:  Note: Objective measures were  completed at Evaluation unless otherwise noted.   PATIENT REPORTED OUTCOME MEASURES (PROM): Communication Effectiveness Survey: provided 05/1724; pt scored himself 25/32 with higher scores indicating less of an impact on pt's QoL. Lowest score was 2 for being a part of a conversation in a noisy environment.                                                                                                                             TREATMENT DATE:   06/09/24: PT's ENT eval is today. [name] states that he is definitely more aware of softer speech than he was prior to ST. He has not practiced with voice exercises since last session giving the holidays and company as the reason. He tells SLP he is getting more used to speaking louder and has some cues from family to speak louder. Pt spoke initially for 7 minutes without notable decrease in loudness, the next portion of conversation was 13 minutes and was also average WNL volume. SLP and pt went outdoors and pt maintained intelligible speech with distant playground noise, and vehicle noise. SLP explained rationale for PhoRTE exercises should pt require these after ENT eval.  05/26/24: Pt practiced about 3 days since last session. I can tell when I'm not loud enough. Pt states when he knows he is not shouting then he is not speaking loudly enough. Pt's speech in first 9 minutes of conversation was WNL.Next 10 minutes was average low-WNL. In next 8 minutes pt told SLP he knew he was louder and he was just that - average 71dB. SLP and pt walked to the front of the building, outside, and SLP judged pt's loudness which remained WNL. Pt filled out CES, with results above. [name] reported today that after 8-10 minutes of conversation he is dyspneic. SLP told [name] to give that info to ENT.  05/19/24: SLP guided pt through practice with more intent and purpose when speaking in order to incr pt's habitual speech volume. PT had focused on more intent with speech for approx 80% in first 3 mintues conversation in the speech room, average 70dB. He read paragraphs with average 71dB, SLP used auditory cues to prove to pt he was not shouting. In sentence responses with some restrictions (sentences with phrases and two-word combinations) pt used average WNL volume. In multisentence responses pt had WNL volume approx 85%  of the time; Self awareness of decr'd volume improved over the session. In monologues of approx one minute, pt maintained WNL volume average 75% of the time. SLP provided homework for pt and told pt to complete 5 days/week for 20 minutes, or 4 days/week for 2 15-minute practice periods. Some but minimal carryover notable to conversation following practice today.  04/28/24: SLP guided pt through diagnostic therapy session where [name] spoke in  monologues with average 72dB, within normal limits volume. SLP talked with pt about the therapy course. Pt preferred x1/week and SLP preferred x1-2/week; agreed on x1/week. SLP Explained to  pt rationale for ENT evaluation.    PATIENT EDUCATION: Education details: see Treatment date above Person educated: Patient Education method: Explanation Education comprehension: verbalized understanding and needs further education  HOME EXERCISE PROGRAM: Practice louder speech  GOALS: Goals reviewed with patient? No  SHORT TERM GOALS: Target date: 06/06/24  Pt will demo average WNL volume with 18/20 sentences Baseline: Goal status: met  2.  Pt will practice home tasks 4/7 days/week for 2 weeks Baseline:  Goal status: not met   LONG TERM GOALS: Target date: 1226/25  Pt will improve PROM Baseline:  Goal status: INITIAL  2.  Pt will report wife does not ask him to repeat at the frequency she was prior to ST Baseline:  Goal status: INITIAL  3.  [name] will produce average WNL speech volume in 10 minutes conversation in 2 sessions Baseline:  Goal status: INITIAL  ASSESSMENT:  CLINICAL IMPRESSION: Patient is a 73 y.o. M who was seen today for treatment of voice volume due to wife and others telling him he has a softer voice than he used to have. See treatment date above for today's date for further details on today's session. Pt is in a yearly REM behavior sleep disorder study in [place] each year, and PD is a diagnosis that is possible for him  at this time, but no formal diagnosis has been made.  Based upon diagnostic therapy today pt would be a good candidate for skilled ST. Pt's plan of care may be modified based on results of ENT eval.  OBJECTIVE IMPAIRMENTS: include voice disorder. These impairments are limiting patient from household responsibilities, ADLs/IADLs, and effectively communicating at home and in community. Factors affecting potential to achieve goals and functional outcome are none noted.. Patient will benefit from skilled SLP services to address above impairments and improve overall function.  REHAB POTENTIAL: Good  PLAN:  SLP FREQUENCY: 1-2x/week however pt prefers x1/week  SLP DURATION: 8 weeks  PLANNED INTERVENTIONS: Internal/external aids, Oral motor exercises, Functional tasks, SLP instruction and feedback, Compensatory strategies, Patient/family education, and 07492 Treatment of speech (30 or 45 min)     Carlyon Nolasco, CCC-SLP 06/09/2024, 9:32 AM

## 2024-06-09 NOTE — Progress Notes (Signed)
 Dear Dr. Joshua, Here is my assessment for our mutual patient, Clarence Ware. Thank you for allowing me the opportunity to care for your patient. Please do not hesitate to contact me should you have any other questions. Sincerely, Dr. Penne Croak  Otolaryngology Clinic Note Referring provider: Dr. Joshua HPI:  Discussed the use of AI scribe software for clinical note transcription with the patient, who gave verbal consent to proceed.  History of Present Illness Clarence Ware is a 73 year old male with Parkinson's disease who presents with a soft voice. He was referred by his neurologist for speech therapy due to a soft voice associated with Parkinson's disease.  Hypophonia and voice fatigue - Soft voice associated with Parkinson's disease, noted by his wife - Requires conscious effort to project his voice; does not naturally get louder - Occasional raspy quality to voice, infrequent - Voice fatigues and becomes softer after approximately ten minutes of talking - Currently undergoing speech therapy with a speech language pathologist for this issue - Wife frequently notices the soft voice, which can be frustrating for her  Dizziness with exertion - Episodes of dizziness, particularly triggered by strenuous exercise - Dizziness has worsened over time - Required use of a cardiac monitor for two weeks, which ended last Monday; results pending - Dizziness initially occurred during sessions with a trainer, necessitating sitting down between sets  Hearing loss - Wearing hearing aids for approximately fifteen years - Recently obtained a new pair of hearing aids from [facility] - Initial hearing evaluation performed by Dr. Sherill - Has had hearing aids fitted twice previously  Gastroesophageal reflux symptoms - History of reflux, currently asymptomatic - Not taking any medication for reflux - Allergic to Nexium - Manages reflux by dietary modifications   Independent Review of  Additional Tests or Records:  Reviewed external note from referring PCP, Clarence Ware,describing relevant history incorporated into today's evaluation. I personally reviewed Clarence Ware's note.   PMH/Meds/All/SocHx/FamHx/ROS:   Past Medical History:  Diagnosis Date   Allergy    First degree heart block    Hyperlipidemia    Hypertension    REM sleep behavior disorder    Substance abuse [facility])      Past Surgical History:  Procedure Laterality Date   INGUINAL HERNIA REPAIR Right 03/10/2015   Procedure: OPEN RIGHT INGUINAL HERNIA REPAIR;  Surgeon: Clarence Lunger, MD;  Location: [facility];  Service: General;  Laterality: Right;  With MESH   VASECTOMY      Family History  Problem Relation Age of Onset   Heart disease Mother    Stroke Mother    Heart disease Father 78       MI   Hypertension Father    Hypertension Brother    Cancer Brother    Diabetes Brother    Heart disease Brother    Hyperlipidemia Brother    Cancer Brother    Hyperlipidemia Sister    Coronary artery disease Brother 68       CABG   Colon cancer Paternal Grandmother      Social Connections: Moderately Integrated (06/08/2024)   Social Connection and Isolation Panel    Frequency of Communication with Friends and Family: More than three times a week    Frequency of Social Gatherings with Friends and Family: More than three times a week    Attends Religious Services: Patient declined    Active Member of Clubs or Organizations: Yes    Attends Banker Meetings: 1 to 4 times per  year    Marital Status: Married      Current Outpatient Medications:    alfuzosin  (UROXATRAL ) 10 MG 24 hr tablet, TAKE 1 TABLET BY MOUTH EVERY MORNING, Disp: 90 tablet, Rfl: 1   aspirin  EC 81 MG tablet, Take 1 tablet (81 mg total) by mouth daily., Disp: 90 tablet, Rfl: 0   celecoxib  (CELEBREX ) 100 MG capsule, Take 1 capsule (100 mg total) by mouth 2 (two) times daily as needed., Disp: 90 capsule, Rfl: 1   cetirizine (ZYRTEC) 10  MG tablet, Take 10 mg by mouth as directed. PRN (Patient taking differently: Take 10 mg by mouth as needed for allergies. PRN), Disp: , Rfl:    Cholecalciferol (VITAMIN D ) 2000 units CAPS, Take 1 capsule by mouth daily., Disp: , Rfl:    clonazePAM  (KLONOPIN ) 0.5 MG tablet, TAKE A HALF TABLET BY MOUTH EVERY NIGHT AT BEDTIME AS NEEDED FOR ANXIETY, Disp: 45 tablet, Rfl: 0   docusate sodium (COLACE) 50 MG capsule, Take 100 mg by mouth daily as needed for mild constipation or moderate constipation., Disp: , Rfl:    Evolocumab  (REPATHA  SURECLICK) 140 MG/ML SOAJ, Inject 140 mg into the skin every 14 (fourteen) days., Disp: 6 mL, Rfl: 3   JARDIANCE  10 MG TABS tablet, TAKE 1 TABLET BY MOUTH DAILY BEFORE BREAKFAST, Disp: 90 tablet, Rfl: 0   MYRBETRIQ 50 MG TB24 tablet, Take 50 mg by mouth daily., Disp: , Rfl:    nitroGLYCERIN  (NITROSTAT ) 0.4 MG SL tablet, Place 1 tablet (0.4 mg total) under the tongue every 5 (five) minutes as needed for chest pain., Disp: 50 tablet, Rfl: 3   rosuvastatin  (CRESTOR ) 20 MG tablet, Take 1 tablet (20 mg total) by mouth daily., Disp: 90 tablet, Rfl: 3   Physical Exam:   BP (!) 159/91 (BP Location: Left Arm, Patient Position: Sitting)   Pulse (!) 51   Temp 98.2 F (36.8 C)   Ht 6' 2 (1.88 m)   Wt 194 lb (88 kg)   SpO2 98%   BMI 24.91 kg/m   The patient was awake, alert, and appropriate. The external ears were inspected, and otoscopy was performed to evaluate the external auditory canals and tympanic membranes. The nasal cavity and septum were examined for mucosal changes, obstruction, or discharge. The oral cavity and oropharynx were inspected for mucosal lesions, infection, or tonsillar hypertrophy. The neck was palpated for lymphadenopathy, thyroid  abnormalities, or other masses. Cranial nerve function was grossly intact.  Pertinent Findings: Physical Exam HEENT: Atraumatic, normocephalic. Nose with deviated septum. Throat with redness and swelling. Vocal cords, both  open and close well. Voice is soft but correctable with patient intention. No breathy quality. Good vf excursion. No weakness or paralysis. No tonsillar ulceration.    Seprately Identifiable Procedures:  I personally ordered, reviewed and interpreted the following with the patient today  Procedure Note Pre-procedure diagnosis:  Dysphonia  Post-procedure diagnosis: Same Procedure: Video Laryngostroboscopy; CPT [phone] - Mod 25 Indication: See pre-procedure diagnosis:  Date: 06/09/24   The procedure was undertaken to further evaluate the patient's complaint of soft voice and markers for parkinsons; a transnasal video laryngostroboscopy (VLS) was performed to closely evaluate the patient's laryngeal biomechanics and vocal fold oscillation.  Patient was identified as correct patient. Verbal consent was obtained. The nose was sprayed bilaterally as below. The flexible scope was passed through the nose to view the nasal cavity, pharynx (oropharynx, hypopharynx) and larynx.  The larynx was examined at rest and during multiple phonatory tasks. Documentation was obtained  and reviewed with patient. The scope was removed. The patient tolerated the procedure well.  VLS Exam Detail:  Amplitude - Left: normal Amplitude - Right: normal  Mucosal Wave - Left: normal Mucosal Wave - Right: Irregular, sulcus  Vocal Fold Motion - Left: No motion abnormalities Vocal Fold Motion - Right: No motion abnormalities  Vertical Level of Approximation: equal Glottic Closure: complete  Phase Symmetry: asymmetric Periodicity (Regularity): regular  Supraglottic Hyperfunction: normal Nasopharynx: Bilateral tori wnl, no masses or lesions  Airway: Widely Patent Additional Observations: none   Exam Summary: Examiner: BK Diagnostician: Penne Croak, DO  Anesthesia: Yes, Topical (oxymetazoline and 4% lidocaine ) Sensitivity to Scope: Tolerated well   Summary of Findings: The videostroboscopic exam revealed sulcus vocalis and  irregular mucosal wave, glottic gap and vocal fold atrophy. There were no masses, lesions, or vocal fold motion abnormalities on exam.   Electronically signed by: Penne Croak, DO 06/09/2024 9:05 PM   Impression & Plans:  Clarence Ware is a 73 y.o. male  1. 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 Dysphonia   4. Soft hoarse voice    - Findings and diagnoses discussed in detail with the patient. - Risks, benefits, and alternatives were reviewed. Through shared decision making, the patient elects to proceed with below. Assessment & Plan Laryngeal/pharyngeal reflux Chronic laryngeal/pharyngeal reflux with throat clearing and pharyngeal swelling. Allergic to Nexium. - Start famotidine once daily at dinner. - Increase famotidine to twice daily if symptoms persist. - Consider Protonix if famotidine is ineffective, to be taken before breakfast.  Hypophonia associated with Parkinson's disease Hypophonia with soft voice. Voice therapy initiated with improvement. Vocal cords normal. Discussed potential future use of vocal cord filler. - Continue voice therapy. He is about half way through his treatment plan. I am okay with extending the treatment plan per SLP and patient request. - Monitor for changes in voice quality. - There is an unsynchronized mucosal wave and stroboscopy but no breathy quality to his voice. Consider vocal cord filler if voice therapy is insufficient.  Plan revisit this at next appt.   - I will place a standing order of in office injection if the patient wishes to proceed with vocal fold augmentation prior to next visit.  Bilateral sensorineural hearing loss Managed with hearing aids. Recent hearing test at [facility]. Discussed conducting tests at ENT office for better quality. - Obtain a copy of the recent hearing test from [facility]. - Consider conducting hearing tests at the ENT office for better quality and follow-up.  - Orders placed:  Orders  Placed This Encounter  Procedures   Ambulatory referral to Speech Therapy   Ambulatory Referral For Surgery Scheduling   - Medications prescribed/continued/adjusted: No orders of the defined types were placed in this encounter.  - Education materials provided to the patient. - Follow up: 6 months. Patient instructed to return sooner or go to the ED if new/worsening symptoms develop.   Thank you for allowing me the opportunity to care for your patient. Please do not hesitate to contact me should you have any other questions.  Sincerely, Penne Croak, DO Otolaryngologist (ENT) [facility] Phone: [phone] Fax: [phone]  06/09/2024, 9:05 PM

## 2024-06-10 DIAGNOSIS — R001 Bradycardia, unspecified: Secondary | ICD-10-CM | POA: Diagnosis not present

## 2024-06-11 ENCOUNTER — Telehealth (INDEPENDENT_AMBULATORY_CARE_PROVIDER_SITE_OTHER): Payer: Self-pay

## 2024-06-11 NOTE — Telephone Encounter (Signed)
 Called patient and scheduled a Restalyn injection per Dr. Willeen order.  Patient would like a call back to explain the restalyn injection procedure.  He can be reached at [phone].

## 2024-06-12 ENCOUNTER — Encounter: Payer: Self-pay | Admitting: Internal Medicine

## 2024-06-12 ENCOUNTER — Ambulatory Visit: Admitting: Internal Medicine

## 2024-06-12 VITALS — BP 126/82 | HR 58 | Temp 98.2°F | Ht 74.0 in | Wt 195.8 lb

## 2024-06-12 DIAGNOSIS — I10 Essential (primary) hypertension: Secondary | ICD-10-CM | POA: Diagnosis not present

## 2024-06-12 NOTE — Telephone Encounter (Signed)
 No answer. Left VM

## 2024-06-12 NOTE — Progress Notes (Signed)
 Subjective:  Patient ID: Clarence Ware, male    DOB: [birth year]  Age: 73 y.o. MRN: [id number]  CC: Results (Discuss test results. )   HPI Clarence Ware presents for f/up  Discussed the use of AI scribe software for clinical note transcription with the patient, who gave verbal consent to proceed.  History of Present Illness Clarence Ware is a 73 year old male with diastolic dysfunction who presents for follow-up on cardiac monitoring and symptoms.  He has not experienced any more episodes of presyncope since his last visit. He has been cautious with his activities, particularly swimming, reducing his sessions from 45 minutes to 30 minutes and not swimming as vigorously.  He has not received results from his heart monitor yet but notes that he had one episode of dizziness while wearing it. He is awaiting an echocardiogram scheduled for December 30th.  He mentions a past episode of chest tightness while swimming, which was brief, and he has not experienced any further chest tightness.  He recalls a previous echocardiogram that showed diastolic dysfunction and a thick heart muscle that does not relax well, consistent with findings from an echo ten years prior.  His blood pressure has been high during recent medical visits, including a reading of 160/95 at an ENT appointment last week.  No recent episodes of chest tightness or leg swelling.     Outpatient Medications Prior to Visit  Medication Sig Dispense Refill   alfuzosin  (UROXATRAL ) 10 MG 24 hr tablet TAKE 1 TABLET BY MOUTH EVERY MORNING 90 tablet 1   aspirin  EC 81 MG tablet Take 1 tablet (81 mg total) by mouth daily. 90 tablet 0   celecoxib  (CELEBREX ) 100 MG capsule Take 1 capsule (100 mg total) by mouth 2 (two) times daily as needed. 90 capsule 1   cetirizine (ZYRTEC) 10 MG tablet Take 10 mg by mouth as directed. PRN (Patient taking differently: Take 10 mg by mouth as needed for allergies. PRN)      Cholecalciferol (VITAMIN D ) 2000 units CAPS Take 1 capsule by mouth daily.     clonazePAM  (KLONOPIN ) 0.5 MG tablet TAKE A HALF TABLET BY MOUTH EVERY NIGHT AT BEDTIME AS NEEDED FOR ANXIETY 45 tablet 0   docusate sodium (COLACE) 50 MG capsule Take 100 mg by mouth daily as needed for mild constipation or moderate constipation.     Evolocumab  (REPATHA  SURECLICK) 140 MG/ML SOAJ Inject 140 mg into the skin every 14 (fourteen) days. 6 mL 3   famotidine (PEPCID) 20 MG tablet Take 20 mg by mouth daily.     JARDIANCE  10 MG TABS tablet TAKE 1 TABLET BY MOUTH DAILY BEFORE BREAKFAST 90 tablet 0   MYRBETRIQ 50 MG TB24 tablet Take 50 mg by mouth daily.     nitroGLYCERIN  (NITROSTAT ) 0.4 MG SL tablet Place 1 tablet (0.4 mg total) under the tongue every 5 (five) minutes as needed for chest pain. 50 tablet 3   rosuvastatin  (CRESTOR ) 20 MG tablet Take 1 tablet (20 mg total) by mouth daily. 90 tablet 3   No facility-administered medications prior to visit.    ROS Review of Systems  Constitutional:  Negative for diaphoresis, fatigue and fever.  HENT: Negative.    Respiratory: Negative.  Negative for chest tightness, shortness of breath and wheezing.   Cardiovascular:  Negative for chest pain, palpitations and leg swelling.  Gastrointestinal: Negative.  Negative for abdominal pain, diarrhea, nausea and vomiting.  Endocrine: Negative.   Genitourinary: Negative.  Negative for difficulty urinating.  Musculoskeletal:  Positive for arthralgias and gait problem. Negative for joint swelling and myalgias.  Neurological:  Negative for dizziness and light-headedness.  Hematological:  Negative for adenopathy. Does not bruise/bleed easily.  Psychiatric/Behavioral: Negative.      Objective:  BP 126/82 (BP Location: Left Arm, Patient Position: Sitting, Cuff Size: Normal)   Pulse (!) 58   Temp 98.2 F (36.8 C) (Oral)   Ht 6' 2 (1.88 m)   Wt 195 lb 12.8 oz (88.8 kg)   SpO2 97%   BMI 25.14 kg/m   BP Readings from  Last 3 Encounters:  06/12/24 126/82  06/09/24 (!) 159/91  05/23/24 126/84    Wt Readings from Last 3 Encounters:  06/12/24 195 lb 12.8 oz (88.8 kg)  06/09/24 194 lb (88 kg)  05/23/24 195 lb (88.5 kg)    Physical Exam Vitals reviewed.  Constitutional:      Appearance: Normal appearance.  Eyes:     General: No scleral icterus.    Conjunctiva/sclera: Conjunctivae normal.  Cardiovascular:     Rate and Rhythm: Normal rate and regular rhythm.     Heart sounds: Murmur heard.     No friction rub. No gallop.  Pulmonary:     Effort: Pulmonary effort is normal.     Breath sounds: No stridor. No wheezing, rhonchi or rales.  Abdominal:     General: Abdomen is flat.     Palpations: There is no mass.     Tenderness: There is no abdominal tenderness. There is no guarding.     Hernia: No hernia is present.  Musculoskeletal:        General: Normal range of motion.     Cervical back: Neck supple.     Right lower leg: No edema.     Left lower leg: No edema.  Lymphadenopathy:     Cervical: No cervical adenopathy.  Skin:    General: Skin is warm.  Neurological:     General: No focal deficit present.     Mental Status: He is alert.  Psychiatric:        Mood and Affect: Mood normal.        Behavior: Behavior normal.     Lab Results  Component Value Date   WBC 6.8 04/02/2024   HGB 15.3 04/02/2024   HCT 46.4 04/02/2024   PLT 197.0 04/02/2024   GLUCOSE 80 05/16/2024   CHOL 101 12/11/2023   TRIG 46 12/11/2023   HDL 55 12/11/2023   LDLCALC 34 12/11/2023   ALT 27 10/01/2023   AST 24 10/01/2023   NA 141 05/16/2024   K 4.1 05/16/2024   CL 104 05/16/2024   CREATININE 0.94 05/16/2024   BUN 24 (H) 05/16/2024   CO2 29 05/16/2024   TSH 1.07 04/02/2024   PSA 2.79 04/04/2023   HGBA1C 6.8 (H) 04/02/2024   MICROALBUR <0.7 05/16/2024    US  LIMITED JOINT SPACE STRUCTURES LOW RIGHT(NO LINKED CHARGES) Result Date: 06/01/2024 Limited muscular skeletal ultrasound was performed and  interpreted by CLAUDENE HUSSAR, M Limited ultrasound shows that there is improvement in the displacement of the meniscus but still 20% still noted.  Patient also has some narrowing of the medial joint line noted. Impression: Medial meniscus tear but improvement.   CT CORONARY MORPH W/CTA COR W/SCORE W/CA W/CM &/OR WO/CM Addendum Date: 05/22/2024 ADDENDUM REPORT: 05/22/2024 19:08 EXAM: OVER-READ INTERPRETATION  CT CHEST The following report is an over-read performed by radiologist Dr. Oneil Devonshire of [facility] on 05/22/2024. This over-read does  not include interpretation of cardiac or coronary anatomy or pathology. The coronary calcium  score/coronary CTA interpretation by the cardiologist is attached. COMPARISON:  10/10/2022 FINDINGS: Cardiovascular: There are no significant extracardiac vascular findings. Mediastinum/Nodes: There are no enlarged lymph nodes within the visualized mediastinum. Lungs/Pleura: There is no pleural effusion. The visualized lungs appear clear. Upper abdomen: No significant findings in the visualized upper abdomen. Musculoskeletal/Chest wall: No chest wall mass or suspicious osseous findings within the visualized chest. IMPRESSION: No significant extracardiac findings within the visualized chest. Electronically Signed   By: Oneil Devonshire M.D.   On: 05/22/2024 19:08   Result Date: 05/22/2024 CLINICAL DATA:  Chest pain EXAM: Cardiac/Coronary CTA TECHNIQUE: A non-contrast, gated CT scan was obtained with axial slices of 3 mm through the heart for calcium  scoring. Calcium  scoring was performed using the Agatston method. A 120 kV prospective, gated, contrast cardiac scan was obtained. Gantry rotation speed was 250 msecs and collimation was 0.6 mm. Two sublingual nitroglycerin  tablets (0.8 mg) were given. The 3D data set was reconstructed in 5% intervals of the 35-75% of the R-R cycle. Diastolic phases were analyzed on a dedicated workstation using MPR, MIP, and VRT modes. The  patient received 95 cc of contrast. FINDINGS: Image quality: Excellent. Noise artifact is: Limited. Coronary Arteries:  Normal coronary origin.  Right dominance. Left main: The left main is a large caliber vessel with a normal take off from the left coronary cusp that trifurcates into a LAD, LCX, and ramus intermedius. There is no plaque or stenosis. Left anterior descending artery: The LAD gives off 2 patent diagonal branches. There is mild calcified plaque in the ostial/prox LAD/D1 (25-49%). There is moderate calcified plaque in the mid LAD (50-69%). There is minimal Calcified plaque in the mid D2 (<25%). Ramus intermedius: There is minimal calcified plaque in the prox Ramus (<25%) and mild soft plaque in the mid vessel (25-49%). Left circumflex artery: The LCX is non-dominant and patent with no evidence of plaque or stenosis. The LCX gives off 2 patent obtuse marginal branches. Right coronary artery: The RCA is dominant with normal take off from the right coronary cusp. There is minimal calcified plaque in the prox RCA (<25%). The RCA terminates as a PDA and right posterolateral branch with minimal calcified plaque in the mid RPLA (<25%). Right Atrium: Right atrial size is within normal limits. Right Ventricle: The right ventricular cavity is within normal limits. Left Atrium: Left atrial size is normal in size with no left atrial appendage filling defect. Left Ventricle: The ventricular cavity size is within normal limits. Pulmonary arteries: Normal in size. Pulmonary veins: Normal pulmonary venous drainage. Pericardium: Normal thickness without significant effusion or calcium  present. Cardiac valves: The aortic valve is trileaflet with leaflet calcification. The mitral valve is normal without significant calcification. Aorta: Normal caliber with scattered atherosclerosis. Extra-cardiac findings: See attached radiology report for non-cardiac structures. IMPRESSION: 1. Coronary calcium  score of 245. This was 54th  percentile for age-, sex, and race-matched controls. 2. Normal coronary origin with right dominance. 3. Mild to moderate atherosclerosis: 50-69% mid LAD; 25-49% mid ramus and ostial/prox LAD/D1 4. AV calcifications (Ca score of AV 636). 5. Study has been submitted for FFR analysis. RECOMMENDATIONS: 1. CAD-RADS 0: No evidence of CAD (0%). Consider non-atherosclerotic causes of chest pain. 2. CAD-RADS 1: Minimal non-obstructive CAD (0-24%). Consider non-atherosclerotic causes of chest pain. Consider preventive therapy and risk factor modification. 3. CAD-RADS 2: Mild non-obstructive CAD (25-49%). Consider non-atherosclerotic causes of chest pain. Consider preventive therapy and  risk factor modification. 4. CAD-RADS 3: Moderate stenosis. Consider symptom-guided anti-ischemic pharmacotherapy as well as risk factor modification per guideline directed care. Additional analysis with CT FFR will be submitted. 5. CAD-RADS 4: Severe stenosis. (70-99% or > 50% left main). Cardiac catheterization or CT FFR is recommended. Consider symptom-guided anti-ischemic pharmacotherapy as well as risk factor modification per guideline directed care. Invasive coronary angiography recommended with revascularization per published guideline statements. 6. CAD-RADS 5: Total coronary occlusion (100%). Consider cardiac catheterization or viability assessment. Consider symptom-guided anti-ischemic pharmacotherapy as well as risk factor modification per guideline directed care. 7. CAD-RADS N: Non-diagnostic study. Obstructive CAD can't be excluded. Alternative evaluation is recommended. Wilbert Bihari, MD Electronically Signed: By: Wilbert Bihari M.D. On: 05/22/2024 19:00   CT CORONARY FRACTIONAL FLOW RESERVE FLUID ANALYSIS Result Date: 05/22/2024 EXAM: FFRCT ANALYSIS FINDINGS: FFRct analysis was performed on the original cardiac CT angiogram dataset. Diagrammatic representation of the FFRct analysis is provided in a separate PDF document in  PACS. This dictation was created using the PDF document and an interactive 3D model of the results. 3D model is not available in the EMR/PACS. Normal FFR range is >0.80. 1. Left Main: 0.98. 2. LAD: No significant stenosis: 0.94, 0.90, 0.86. 3. LCX: No significant stenosis: 0.94, 0.93, 0.91. 4. Ramus: No significant stenosis: 0.95, 0.93, 0.88. 5. RCA: No significant stenosis: 0.97, 0.96, 0.96. IMPRESSION: 1. Coronary CTA FFR analysis demonstrates no significant hemodynamically flow limiting lesions. Wilbert Bihari, MD Electronically Signed   By: Wilbert Bihari M.D.   On: 05/22/2024 19:06    Assessment & Plan:   Essential hypertension- His BP is well controlled.     Follow-up: No follow-ups on file.  Debby Molt, MD

## 2024-06-15 ENCOUNTER — Encounter: Payer: Self-pay | Admitting: Internal Medicine

## 2024-06-15 DIAGNOSIS — R001 Bradycardia, unspecified: Secondary | ICD-10-CM

## 2024-06-16 ENCOUNTER — Ambulatory Visit

## 2024-06-16 NOTE — Telephone Encounter (Signed)
 10:50 06/16/24. Outgoing call. Spoke to patient. Educated on vocal fold augmentation. He would like to proceed with SLP therapy solely at this time.

## 2024-06-18 ENCOUNTER — Ambulatory Visit

## 2024-06-19 ENCOUNTER — Ambulatory Visit: Payer: PPO

## 2024-06-19 VITALS — Ht 73.5 in | Wt 190.0 lb

## 2024-06-19 DIAGNOSIS — Z0000 Encounter for general adult medical examination without abnormal findings: Secondary | ICD-10-CM

## 2024-06-19 NOTE — Patient Instructions (Addendum)
 Mr. Mcbain,  Thank you for taking the time for your Medicare Wellness Visit. I appreciate your continued commitment to your health goals. Please review the care plan we discussed, and feel free to reach out if I can assist you further.  Please note that Annual Wellness Visits do not include a physical exam. Some assessments may be limited, especially if the visit was conducted virtually. If needed, we may recommend an in-person follow-up with your provider.  Ongoing Care Seeing your primary care provider every 3 to 6 months helps us  monitor your health and provide consistent, personalized care.   Referrals If a referral was made during today's visit and you haven't received any updates within two weeks, please contact the referred provider directly to check on the status.  Recommended Screenings:  Health Maintenance  Topic Date Due   COVID-19 Vaccine (5 - 2025-26 season) 03/10/2024   Medicare Annual Wellness Visit  06/17/2024   Eye exam for diabetics  07/23/2024   Hemoglobin A1C  09/30/2024   Colon Cancer Screening  03/18/2025   Yearly kidney function blood test for diabetes  05/16/2025   Yearly kidney health urinalysis for diabetes  05/16/2025   Complete foot exam   05/16/2025   DTaP/Tdap/Td vaccine (3 - Td or Tdap) 02/22/2030   Pneumococcal Vaccine for age over 14  Completed   Flu Shot  Completed   Hepatitis C Screening  Completed   Zoster (Shingles) Vaccine  Completed   Meningitis B Vaccine  Aged Out   Hepatitis B Vaccine  Discontinued       06/19/2024    1:18 PM  Advanced Directives  Does Patient Have a Medical Advance Directive? Yes  Type of Estate Agent of [place];Living will  Does patient want to make changes to medical advance directive? Yes (Inpatient - patient requests chaplain consult to change a medical advance directive)  Copy of Healthcare Power of Attorney in Chart? No - copy requested    Vision: Annual vision screenings are  recommended for early detection of glaucoma, cataracts, and diabetic retinopathy. These exams can also reveal signs of chronic conditions such as diabetes and high blood pressure.  Dental: Annual dental screenings help detect early signs of oral cancer, gum disease, and other conditions linked to overall health, including heart disease and diabetes.

## 2024-06-19 NOTE — Progress Notes (Signed)
 Chief Complaint  Patient presents with   Medicare Wellness     Subjective:   Clarence Ware is a 73 y.o. male who presents for a Medicare Annual Wellness Visit.  Visit info / Clinical Intake: Medicare Wellness Visit Type:: Subsequent Annual Wellness Visit Persons participating in visit and providing information:: patient Medicare Wellness Visit Mode:: Telephone If telephone:: video declined Since this visit was completed virtually, some vitals may be partially provided or unavailable. Missing vitals are due to the limitations of the virtual format.: Documented vitals are patient reported If Telephone or Video please confirm:: I connected with patient using audio/video enable telemedicine. I verified patient identity with two identifiers, discussed telehealth limitations, and patient agreed to proceed. Patient Location:: Home Provider Location:: Office Interpreter Needed?: No Pre-visit prep was completed: yes AWV questionnaire completed by patient prior to visit?: yes Date:: 06/15/24 Living arrangements:: lives with spouse/significant other Patient's Overall Health Status Rating: very good Typical amount of pain: none Does pain affect daily life?: no Are you currently prescribed opioids?: no  Dietary Habits and Nutritional Risks How many meals a day?: 2 Eats fruit and vegetables daily?: yes Most meals are obtained by: preparing own meals In the last 2 weeks, have you had any of the following?: none Diabetic:: (!) yes Any non-healing wounds?: no How often do you check your BS?: 0 Would you like to be referred to a Nutritionist or for Diabetic Management? : no  Functional Status Activities of Daily Living (to include ambulation/medication): Independent Ambulation: Independent with device- listed below Home Assistive Devices/Equipment: Eyeglasses Medication Administration: Independent Home Management (perform basic housework or laundry): Independent Manage your own  finances?: yes Primary transportation is: driving Concerns about vision?: no *vision screening is required for WTM* Concerns about hearing?: (!) yes Uses hearing aids?: (!) yes Hear whispered voice?: yes  Fall Screening Falls in the past year?: 1 Number of falls in past year: 0 (1) Was there an injury with Fall?: 0 Fall Risk Category Calculator: 1 Patient Fall Risk Level: Low Fall Risk  Fall Risk Patient at Risk for Falls Due to: No Fall Risks Fall risk Follow up: Falls evaluation completed; Falls prevention discussed  Home and Transportation Safety: All rugs have non-skid backing?: yes All stairs or steps have railings?: (!) no Grab bars in the bathtub or shower?: yes Have non-skid surface in bathtub or shower?: yes Good home lighting?: yes Regular seat belt use?: yes Hospital stays in the last year:: no  Cognitive Assessment Difficulty concentrating, remembering, or making decisions? : yes Will 6CIT or Mini Cog be Completed: yes What year is it?: 0 points What month is it?: 0 points Give patient an address phrase to remember (5 components): [address] [place], [place] About what time is it?: 0 points  Advance Directives (For Healthcare) Does Patient Have a Medical Advance Directive?: Yes Does patient want to make changes to medical advance directive?: Yes (Inpatient - patient requests chaplain consult to change a medical advance directive) Type of Advance Directive: Healthcare Power of [place]; Living will Copy of Healthcare Power of Attorney in Chart?: No - copy requested Copy of Living Will in Chart?: No - copy requested  Reviewed/Updated  Reviewed/Updated: Reviewed All (Medical, Surgical, Family, Medications, Allergies, Care Teams, Patient Goals)    Allergies (verified) Nexium [esomeprazole magnesium]   Current Medications (verified) Outpatient Encounter Medications as of 06/19/2024  Medication Sig   alfuzosin  (UROXATRAL ) 10 MG 24 hr tablet TAKE 1 TABLET BY  MOUTH EVERY MORNING   aspirin   EC 81 MG tablet Take 1 tablet (81 mg total) by mouth daily.   celecoxib  (CELEBREX ) 100 MG capsule Take 1 capsule (100 mg total) by mouth 2 (two) times daily as needed.   cetirizine (ZYRTEC) 10 MG tablet Take 10 mg by mouth as directed. PRN (Patient taking differently: Take 10 mg by mouth as needed for allergies. PRN)   Cholecalciferol (VITAMIN D ) 2000 units CAPS Take 1 capsule by mouth daily.   clonazePAM  (KLONOPIN ) 0.5 MG tablet TAKE A HALF TABLET BY MOUTH EVERY NIGHT AT BEDTIME AS NEEDED FOR ANXIETY   docusate sodium (COLACE) 50 MG capsule Take 100 mg by mouth daily as needed for mild constipation or moderate constipation.   Evolocumab  (REPATHA  SURECLICK) 140 MG/ML SOAJ Inject 140 mg into the skin every 14 (fourteen) days.   famotidine (PEPCID) 20 MG tablet Take 20 mg by mouth daily.   JARDIANCE  10 MG TABS tablet TAKE 1 TABLET BY MOUTH DAILY BEFORE BREAKFAST   MYRBETRIQ 50 MG TB24 tablet Take 50 mg by mouth daily.   nitroGLYCERIN  (NITROSTAT ) 0.4 MG SL tablet Place 1 tablet (0.4 mg total) under the tongue every 5 (five) minutes as needed for chest pain.   rosuvastatin  (CRESTOR ) 20 MG tablet Take 1 tablet (20 mg total) by mouth daily.   No facility-administered encounter medications on file as of 06/19/2024.    History: Past Medical History:  Diagnosis Date   Allergy    First degree heart block    Hyperlipidemia    Hypertension    REM sleep behavior disorder    Substance abuse [facility])    Past Surgical History:  Procedure Laterality Date   INGUINAL HERNIA REPAIR Right 03/10/2015   Procedure: OPEN RIGHT INGUINAL HERNIA REPAIR;  Surgeon: Donnice Lunger, MD;  Location: [facility];  Service: General;  Laterality: Right;  With MESH   VASECTOMY     Family History  Problem Relation Age of Onset   Heart disease Mother    Stroke Mother    Heart disease Father 1       MI   Hypertension Father    Hypertension Brother    Cancer Brother    Diabetes Brother     Heart disease Brother    Hyperlipidemia Brother    Cancer Brother    Hyperlipidemia Sister    Coronary artery disease Brother 34       CABG   Colon cancer Paternal Grandmother    Social History   Occupational History   Occupation: Pharmacist part-time  Tobacco Use   Smoking status: Former    Current packs/day: 0.00    Average packs/day: 0.3 packs/day for 1 year (0.3 ttl pk-yrs)    Types: Cigarettes    Start date: 07/11/1991    Quit date: 07/10/1992    Years since quitting: 31.9   Smokeless tobacco: Never  Vaping Use   Vaping status: Never Used  Substance and Sexual Activity   Alcohol use: No    Alcohol/week: 0.0 standard drinks of alcohol   Drug use: Not Currently    Comment: sobrity for 10 years   Sexual activity: Yes   Tobacco Counseling Counseling given: Yes  SDOH Screenings   Food Insecurity: No Food Insecurity (06/19/2024)  Housing: Unknown (06/19/2024)  Transportation Needs: No Transportation Needs (06/19/2024)  Utilities: Not At Risk (06/19/2024)  Depression (PHQ2-9): Low Risk (06/19/2024)  Financial Resource Strain: Low Risk (06/08/2024)  Physical Activity: Sufficiently Active (06/19/2024)  Social Connections: Moderately Integrated (06/19/2024)  Stress: No Stress Concern Present (06/19/2024)  Tobacco Use: Medium Risk (06/19/2024)  Health Literacy: Adequate Health Literacy (06/19/2024)   See flowsheets for full screening details  Depression Screen PHQ 2 & 9 Depression Scale- Over the past 2 weeks, how often have you been bothered by any of the following problems? Little interest or pleasure in doing things: 0 Feeling down, depressed, or hopeless (PHQ Adolescent also includes...irritable): 0 PHQ-2 Total Score: 0 Trouble falling or staying asleep, or sleeping too much: 0 Feeling tired or having little energy: 0 Poor appetite or overeating (PHQ Adolescent also includes...weight loss): 0 Feeling bad about yourself - or that you are a failure or have let  yourself or your family down: 0 Trouble concentrating on things, such as reading the newspaper or watching television (PHQ Adolescent also includes...like school work): 0 Moving or speaking so slowly that other people could have noticed. Or the opposite - being so fidgety or restless that you have been moving around a lot more than usual: 0 Thoughts that you would be better off dead, or of hurting yourself in some way: 0 PHQ-9 Total Score: 0 If you checked off any problems, how difficult have these problems made it for you to do your work, take care of things at home, or get along with other people?: Not difficult at all  Depression Treatment Depression Interventions/Treatment : EYV7-0 Score <4 Follow-up Not Indicated     Goals Addressed               This Visit's Progress     Patient Stated (pt-stated)        Patient stated he plans to continue exercising and monitor dizziness             Objective:    Today's Vitals   06/19/24 1316  Weight: 190 lb (86.2 kg)  Height: 6' 1.5 (1.867 m)   Body mass index is 24.73 kg/m.  Hearing/Vision screen Hearing Screening - Comments:: Wears hearing aids Vision Screening - Comments:: Wears eyelasses for reading - up to date with routine eye exams with Arlyss King Immunizations and Health Maintenance Health Maintenance  Topic Date Due   COVID-19 Vaccine (5 - 2025-26 season) 03/10/2024   OPHTHALMOLOGY EXAM  07/23/2024   HEMOGLOBIN A1C  09/30/2024   Colonoscopy  03/18/2025   Diabetic kidney evaluation - eGFR measurement  05/16/2025   Diabetic kidney evaluation - Urine ACR  05/16/2025   FOOT EXAM  05/16/2025   Medicare Annual Wellness (AWV)  06/19/2025   DTaP/Tdap/Td (3 - Td or Tdap) 02/22/2030   Pneumococcal Vaccine: 50+ Years  Completed   Influenza Vaccine  Completed   Hepatitis C Screening  Completed   Zoster Vaccines- Shingrix  Completed   Meningococcal B Vaccine  Aged Out   Hepatitis B Vaccines 19-59 Average Risk   Discontinued        Assessment/Plan:  This is a routine wellness examination for Clarence Ware.  Patient Care Team: Joshua Debby CROME, MD as PCP - General (Internal Medicine) Burnard Debby LABOR, MD (Inactive) as PCP - Cardiology (Cardiology) Matilda Senior, MD as Consulting Physician (Urology) King Arlyss, MD as Consulting Physician (Ophthalmology) Abran Norleen SAILOR, MD as Consulting Physician (Gastroenterology) Fate Morna SAILOR, [facility] (Inactive) as Pharmacist (Pharmacist)  I have personally reviewed and noted the following in the patients chart:   Medical and social history Use of alcohol, tobacco or illicit drugs  Current medications and supplements including opioid prescriptions. Functional ability and status Nutritional status Physical activity Advanced directives List of other physicians Hospitalizations, surgeries, and  ER visits in previous 12 months Vitals Screenings to include cognitive, depression, and falls Referrals and appointments  No orders of the defined types were placed in this encounter.  In addition, I have reviewed and discussed with patient certain preventive protocols, quality metrics, and best practice recommendations. A written personalized care plan for preventive services as well as general preventive health recommendations were provided to patient.   Verdie CHRISTELLA Saba, CMA   06/19/2024   Return in 1 year (on 06/19/2025).  After Visit Summary: (MyChart) Due to this being a telephonic visit, the after visit summary with patients personalized plan was offered to patient via MyChart   Nurse Notes: Appointment(s) made: (scheduled 2026 AWV/CPE appts)

## 2024-06-20 NOTE — Therapy (Signed)
 OUTPATIENT SPEECH LANGUAGE PATHOLOGY TREATMENT   Patient Name: Clarence Ware MRN: [id number] DOB:[birth year], 73 y.o., male Today's Date: 06/23/2024  PCP: Clarence Ned, MD REFERRING PROVIDER: Dohmeier, Dedra, MD  END OF SESSION:  End of Session - 06/23/24 0935     Visit Number 5    Number of Visits 9    Date for Recertification  07/04/24    SLP Start Time 0934    SLP Stop Time  1015    SLP Time Calculation (min) 41 min    Activity Tolerance Patient tolerated treatment well             Past Medical History:  Diagnosis Date   Allergy    First degree heart block    Hyperlipidemia    Hypertension    REM sleep behavior disorder    Substance abuse [facility])    Past Surgical History:  Procedure Laterality Date   INGUINAL HERNIA REPAIR Right 03/10/2015   Procedure: OPEN RIGHT INGUINAL HERNIA REPAIR;  Surgeon: Clarence Lunger, MD;  Location: [facility];  Service: General;  Laterality: Right;  With MESH   VASECTOMY     Patient Active Problem List   Diagnosis Date Noted   Murmur, cardiac 05/17/2024   Elevated brain natriuretic peptide (BNP) level 05/17/2024   Stable angina 05/17/2024   Bradycardia 05/16/2024   Symptomatic bradycardia 05/16/2024   Chest tightness 05/16/2024   Impacted cerumen of right ear 04/18/2024   Degenerative tear of medial meniscus of right knee 04/11/2024   Diabetes mellitus due to underlying condition with diabetic neuropathy, without long-term current use of insulin (HCC) 04/03/2024   Chronic idiopathic constipation 04/02/2024   Need for immunization against influenza 04/02/2024   Encounter for general adult medical examination with abnormal findings 04/02/2024   Herniated lumbar disc without myelopathy 12/07/2023   High serum lipoprotein(a) 10/11/2023   BPH associated with nocturia 04/04/2023   Chronic heart failure with preserved ejection fraction (HFpEF) (HCC) 10/14/2022   Need for vaccination 09/12/2022   Sleep related bruxism 08/24/2022    UARS (upper airway resistance syndrome) 08/24/2022   Primary osteoarthritis of both knees 12/13/2021   Cerebellar ataxia in diseases classified elsewhere (HCC) 09/01/2021   Seborrheic keratosis 09/01/2021   BPH with urinary obstruction 09/01/2021   Age-related cognitive decline 08/05/2020   Uncontrolled REM sleep behavior disorder 03/16/2020   Essential hypertension 12/18/2014   Hyperlipidemia with target LDL less than 70 12/18/2014   First degree heart block 03/30/2013   GERD 06/26/2008   ERECTILE DYSFUNCTION, MILD 01/30/2008   COLON POLYP 09/06/2006    Onset date: 6 months   REFERRING DIAG: R49.0 (ICD-10-CM) - Dysphonia R43.0 (ICD-10-CM) - Anosmia G47.52 (ICD-10-CM) - Sleep behavior disorder, REM G20.A1 (ICD-10-CM) - Parkinson's disease, unspecified whether dyskinesia present, unspecified whether manifestations fluctuate (HCC)  THERAPY DIAG:  Dysarthria and anarthria  Rationale for Evaluation and Treatment: Rehabilitation  SUBJECTIVE:   SUBJECTIVE STATEMENT:  I think I am speaking louder.   Pt accompanied by: self  PERTINENT HISTORY: Recently pt has had more difficutly with softer speech. Has new hearing aids as of last week.   PAIN:  Are you having pain? No  FALLS: Has patient fallen in last 6 months? Yes, Number of falls: 1  LIVING ENVIRONMENT: Lives with: lives with their spouse Lives in: House/apartment  PLOF:Level of assistance: Independent with ADLs, Independent with IADLs Employment: Retired  PATIENT GOALS: Speak so I can be heard.  OBJECTIVE:  Note: Objective measures were completed at Evaluation unless otherwise noted.  PATIENT REPORTED OUTCOME MEASURES (PROM): Communication Effectiveness Survey: provided 05/1724; pt scored himself 25/32 with higher scores indicating less of an impact on pt's QoL. Lowest score was 2 for being a part of a conversation in a noisy environment.                                                                                                                             TREATMENT DATE:   06/23/24: Per ENT vocal fold atrophy present. 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 SLP told pt to complete PhoRTE for 8 weeks (until Valentie's Day) and then consider whether he wants to go forward with vocal fold injections. SLP encouraged pt to have an external cue to remind him to speak with intent. SLP targeted louder speech in conversation for 10 minutes and pt maintained WNL volume 90% of the time.   06/09/24: PT's ENT eval is today. Clarence Ware states that he is definitely more aware of softer speech than he was prior to ST. He has not practiced with voice exercises since last session giving the holidays and company as the reason. He tells SLP he is getting more used to speaking louder and has some cues from family to speak louder. Pt spoke initially for 7 minutes without notable decrease in loudness, the next portion of conversation was 13 minutes and was also average WNL volume. SLP and pt went outdoors and pt maintained intelligible speech with distant playground noise, and vehicle noise. SLP explained rationale for PhoRTE exercises should pt require these after ENT eval.  05/26/24: Pt practiced about 3 days since last session. I can tell when I'm not loud enough. Pt states when he knows he is not shouting then he is not speaking loudly enough. Pt's speech in first 9 minutes of conversation was WNL.Next 10 minutes was average low-WNL. In next 8 minutes pt told SLP he knew he was louder and he was just that - average 71dB. SLP and pt walked to the front of the building, outside, and SLP judged pt's loudness which remained WNL. Pt filled out CES, with results above. Clarence Ware reported today that after 8-10 minutes of conversation he is dyspneic. SLP told Clarence Ware to give that info to ENT.  05/19/24: SLP guided pt through practice with more intent and purpose when  speaking in order to incr pt's habitual speech volume. PT had focused on more intent with speech for approx 80% in first 3 mintues conversation in the speech room, average 70dB. He read paragraphs with average 71dB, SLP used auditory cues to prove to pt he was not shouting. In sentence responses with some restrictions (sentences with phrases and two-word combinations) pt used average WNL volume. In multisentence responses pt had WNL volume approx 85% of the time; Self awareness of decr'd volume improved over the session. In monologues of approx one minute, pt maintained WNL volume average 75% of the  time. SLP provided homework for pt and told pt to complete 5 days/week for 20 minutes, or 4 days/week for 2 15-minute practice periods. Some but minimal carryover notable to conversation following practice today.  04/28/24: SLP guided pt through diagnostic therapy session where Clarence Ware spoke in  monologues with average 72dB, within normal limits volume. SLP talked with pt about the therapy course. Pt preferred x1/week and SLP preferred x1-2/week; agreed on x1/week. SLP Explained to pt rationale for ENT evaluation.    PATIENT EDUCATION: Education details: see Treatment date above Person educated: Patient Education method: Explanation Education comprehension: verbalized understanding and needs further education  HOME EXERCISE PROGRAM: Practice louder speech  GOALS: Goals reviewed with patient? No  SHORT TERM GOALS: Target date: 06/06/24  Pt will demo average WNL volume with 18/20 sentences Baseline: Goal status: met  2.  Pt will practice home tasks 4/7 days/week for 2 weeks Baseline:  Goal status: not met   LONG TERM GOALS: Target date: 1226/25  Pt will improve PROM Baseline:  Goal status: INITIAL  2.  Pt will report wife does not ask him to repeat at the frequency she was prior to ST Baseline:  Goal status: INITIAL  3.  Clarence Ware will produce average WNL speech volume in 10 minutes  conversation in 2 sessions Baseline: 06/23/24 Goal status: INITIAL  4.  Pt will complete PhoRTE with mod I in 2 sessions Baseline:  Goal status: INITIAL   ASSESSMENT:  CLINICAL IMPRESSION: Patient is a 73 y.o. M who was seen today for treatment of voice volume due to wife and others telling him he has a softer voice than he used to have. 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 See treatment date above for today's date for further details on today's session. Pt is in a yearly REM behavior sleep disorder study in [place] each year, and PD is a diagnosis that is possible for him at this time, but no formal diagnosis has been made.  Based upon diagnostic therapy today pt would be a good candidate for skilled ST.   OBJECTIVE IMPAIRMENTS: include voice disorder. These impairments are limiting patient from household responsibilities, ADLs/IADLs, and effectively communicating at home and in community. Factors affecting potential to achieve goals and functional outcome are none noted.. Patient will benefit from skilled SLP services to address above impairments and improve overall function.  REHAB POTENTIAL: Good  PLAN:  SLP FREQUENCY: 1-2x/week however pt prefers x1/week  SLP DURATION: 8 weeks  PLANNED INTERVENTIONS: Internal/external aids, Oral motor exercises, Functional tasks, SLP instruction and feedback, Compensatory strategies, Patient/family education, and [id number] Treatment of speech (30 or 45 min)     Lakeyia Surber, CCC-SLP 06/23/2024, 10:48 AM

## 2024-06-22 ENCOUNTER — Other Ambulatory Visit: Payer: Self-pay | Admitting: Internal Medicine

## 2024-06-22 DIAGNOSIS — R7303 Prediabetes: Secondary | ICD-10-CM

## 2024-06-22 DIAGNOSIS — I5032 Chronic diastolic (congestive) heart failure: Secondary | ICD-10-CM

## 2024-06-23 ENCOUNTER — Ambulatory Visit

## 2024-06-23 DIAGNOSIS — R471 Dysarthria and anarthria: Secondary | ICD-10-CM

## 2024-06-23 NOTE — Patient Instructions (Signed)
° ° °  PHoRTE VOICE EXERCISES - DO TWICE EACH DAY  1) Hold AHHHHHHHHHHHHH 10 times   As long as you can as strong as you can - push from your abdominal muscles!   [email or web address]  2) Count 1-10 (skip 7)   Start at as low pitch as you can, glide to as high pitch as you can, then back down as low as you can   [email or web address]  3) Say 10 sentences -two different ways   (a) like you are calling over the fence to your neighbor in a higher-pitch voice  (b) like you are scolding your dog in a LOW authoritative voice   [email or web address]    [employer]pogle phorte voice therapy exercises and look for the video with Pt. 1

## 2024-06-30 ENCOUNTER — Ambulatory Visit

## 2024-06-30 DIAGNOSIS — R471 Dysarthria and anarthria: Secondary | ICD-10-CM | POA: Diagnosis not present

## 2024-06-30 NOTE — Therapy (Signed)
 " OUTPATIENT SPEECH LANGUAGE PATHOLOGY TREATMENT   Patient Name: Clarence Ware MRN: [id number] DOB:1950-11-30, 73 y.o., male Today's Date: 06/30/2024  PCP: Joshua Ned, MD REFERRING PROVIDER: Dohmeier, Dedra, MD  END OF SESSION:  End of Session - 06/30/24 0943     Visit Number 6    Number of Visits 9    Date for Recertification  07/04/24    SLP Start Time 9062    SLP Stop Time  1015    SLP Time Calculation (min) 38 min    Activity Tolerance Patient tolerated treatment well             Past Medical History:  Diagnosis Date   Allergy    First degree heart block    Hyperlipidemia    Hypertension    REM sleep behavior disorder    Substance abuse [facility])    Past Surgical History:  Procedure Laterality Date   INGUINAL HERNIA REPAIR Right 03/10/2015   Procedure: OPEN RIGHT INGUINAL HERNIA REPAIR;  Surgeon: Donnice Lunger, MD;  Location: [facility];  Service: General;  Laterality: Right;  With MESH   VASECTOMY     Patient Active Problem List   Diagnosis Date Noted   Murmur, cardiac 05/17/2024   Elevated brain natriuretic peptide (BNP) level 05/17/2024   Stable angina 05/17/2024   Bradycardia 05/16/2024   Symptomatic bradycardia 05/16/2024   Chest tightness 05/16/2024   Impacted cerumen of right ear 04/18/2024   Degenerative tear of medial meniscus of right knee 04/11/2024   Diabetes mellitus due to underlying condition with diabetic neuropathy, without long-term current use of insulin (HCC) 04/03/2024   Chronic idiopathic constipation 04/02/2024   Need for immunization against influenza 04/02/2024   Encounter for general adult medical examination with abnormal findings 04/02/2024   Herniated lumbar disc without myelopathy 12/07/2023   High serum lipoprotein(a) 10/11/2023   BPH associated with nocturia 04/04/2023   Chronic heart failure with preserved ejection fraction (HFpEF) (HCC) 10/14/2022   Need for vaccination 09/12/2022   Sleep related bruxism 08/24/2022    UARS (upper airway resistance syndrome) 08/24/2022   Primary osteoarthritis of both knees 12/13/2021   Cerebellar ataxia in diseases classified elsewhere (HCC) 09/01/2021   Seborrheic keratosis 09/01/2021   BPH with urinary obstruction 09/01/2021   Age-related cognitive decline 08/05/2020   Uncontrolled REM sleep behavior disorder 03/16/2020   Essential hypertension 12/18/2014   Hyperlipidemia with target LDL less than 70 12/18/2014   First degree heart block 03/30/2013   GERD 06/26/2008   ERECTILE DYSFUNCTION, MILD 01/30/2008   COLON POLYP 09/06/2006    Onset date: 6 months   REFERRING DIAG: R49.0 (ICD-10-CM) - Dysphonia R43.0 (ICD-10-CM) - Anosmia G47.52 (ICD-10-CM) - Sleep behavior disorder, REM G20.A1 (ICD-10-CM) - Parkinson's disease, unspecified whether dyskinesia present, unspecified whether manifestations fluctuate (HCC)  THERAPY DIAG:  Dysarthria and anarthria  Rationale for Evaluation and Treatment: Rehabilitation  SUBJECTIVE:   SUBJECTIVE STATEMENT:  Im trying to think more about projecting - when I get a reminder it's so much better.   Pt accompanied by: self  PERTINENT HISTORY: Recently pt has had more difficutly with softer speech. Has new hearing aids as of last week.   PAIN:  Are you having pain? No  FALLS: Has patient fallen in last 6 months? Yes, Number of falls: 1  LIVING ENVIRONMENT: Lives with: lives with their spouse Lives in: House/apartment  PLOF:Level of assistance: Independent with ADLs, Independent with IADLs Employment: Retired  PATIENT GOALS: Speak so I can be heard.  OBJECTIVE:  Note: Objective measures were completed at Evaluation unless otherwise noted.   PATIENT REPORTED OUTCOME MEASURES (PROM): Communication Effectiveness Survey: provided 05/1724; pt scored himself 25/32 with higher scores indicating less of an impact on pt's QoL. Lowest score was 2 for being a part of a conversation in a noisy environment.                                                                                                                             TREATMENT DATE:   06/30/24: SLP needs to assess pt's performance with PhoRTE next session. Clarence Ware maintained WNL volume in conversational segments of 2-4 minutes, and over 9 minutes today in simple conversation but after that decr'd to just below WNL average until SLP cued pt. With pt's s statement, SLP stressed use of external cue for maintaining WNL volume over time. Pt will utilize this strategy and let SLP know how it is going to reassess PRN.  Every other week ST was agreed was best at this point, with possibility of decr to once/4 weeks after next session.  06/23/24: Per ENT vocal fold atrophy present. 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 SLP told pt to complete PhoRTE for 8 weeks (until Valentie's Day) and then consider whether he wants to go forward with vocal fold injections. SLP encouraged pt to have an external cue to remind him to speak with intent. SLP targeted louder speech in conversation for 10 minutes and pt maintained WNL volume 90% of the time.   06/09/24: PT's ENT eval is today. Clarence Ware states that he is definitely more aware of softer speech than he was prior to ST. He has not practiced with voice exercises since last session giving the holidays and company as the reason. He tells SLP he is getting more used to speaking louder and has some cues from family to speak louder. Pt spoke initially for 7 minutes without notable decrease in loudness, the next portion of conversation was 13 minutes and was also average WNL volume. SLP and pt went outdoors and pt maintained intelligible speech with distant playground noise, and vehicle noise. SLP explained rationale for PhoRTE exercises should pt require these after ENT eval.  05/26/24: Pt practiced about 3 days since last session. I can tell when I'm not loud  enough. Pt states when he knows he is not shouting then he is not speaking loudly enough. Pt's speech in first 9 minutes of conversation was WNL.Next 10 minutes was average low-WNL. In next 8 minutes pt told SLP he knew he was louder and he was just that - average 71dB. SLP and pt walked to the front of the building, outside, and SLP judged pt's loudness which remained WNL. Pt filled out CES, with results above. Clarence Ware reported today that after 8-10 minutes of conversation he is dyspneic. SLP told Clarence Ware to give that info to ENT.  05/19/24: SLP guided pt  through practice with more intent and purpose when speaking in order to incr pt's habitual speech volume. PT had focused on more intent with speech for approx 80% in first 3 mintues conversation in the speech room, average 70dB. He read paragraphs with average 71dB, SLP used auditory cues to prove to pt he was not shouting. In sentence responses with some restrictions (sentences with phrases and two-word combinations) pt used average WNL volume. In multisentence responses pt had WNL volume approx 85% of the time; Self awareness of decr'd volume improved over the session. In monologues of approx one minute, pt maintained WNL volume average 75% of the time. SLP provided homework for pt and told pt to complete 5 days/week for 20 minutes, or 4 days/week for 2 15-minute practice periods. Some but minimal carryover notable to conversation following practice today.  04/28/24: SLP guided pt through diagnostic therapy session where Clarence Ware spoke in  monologues with average 72dB, within normal limits volume. SLP talked with pt about the therapy course. Pt preferred x1/week and SLP preferred x1-2/week; agreed on x1/week. SLP Explained to pt rationale for ENT evaluation.    PATIENT EDUCATION: Education details: see Treatment date above Person educated: Patient Education method: Explanation Education comprehension: verbalized understanding and needs further  education  HOME EXERCISE PROGRAM: Practice louder speech  GOALS: Goals reviewed with patient? No  SHORT TERM GOALS: Target date: 06/06/24  Pt will demo average WNL volume with 18/20 sentences Baseline: Goal status: met  2.  Pt will practice home tasks 4/7 days/week for 2 weeks Baseline:  Goal status: not met   LONG TERM GOALS: Target date: 1226/25  Pt will improve PROM Baseline:  Goal status: INITIAL  2.  Pt will report wife does not ask him to repeat at the frequency she was prior to ST Baseline:  Goal status: met  3.  Clarence Ware will produce average WNL speech volume in 10 minutes conversation in 2 sessions Baseline: 06/23/24 Goal status: INITIAL  4.  Pt will complete PhoRTE with mod I in 2 sessions Baseline:  Goal status: INITIAL   ASSESSMENT:  CLINICAL IMPRESSION: Patient is a 73 y.o. M who was seen today for treatment of voice volume due to wife and others telling him he has a softer voice than he used to have. 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 See treatment date above for today's date for further details on today's session. Pt is in a yearly REM behavior sleep disorder study in [place] each year, and PD is a diagnosis that is possible for him at this time, but no formal diagnosis has been made.  Based upon diagnostic therapy today pt would be a good candidate for skilled ST.   OBJECTIVE IMPAIRMENTS: include voice disorder. These impairments are limiting patient from household responsibilities, ADLs/IADLs, and effectively communicating at home and in community. Factors affecting potential to achieve goals and functional outcome are none noted.. Patient will benefit from skilled SLP services to address above impairments and improve overall function.  REHAB POTENTIAL: Good  PLAN:  SLP FREQUENCY: every other week   SLP DURATION: 9 sessions total  PLANNED INTERVENTIONS: Internal/external  aids, Oral motor exercises, Functional tasks, SLP instruction and feedback, Compensatory strategies, Patient/family education, and [id number] Treatment of speech (30 or 45 min)     Avianah Pellman, CCC-SLP 06/30/2024, 9:44 AM      "

## 2024-07-07 ENCOUNTER — Ambulatory Visit

## 2024-07-08 ENCOUNTER — Ambulatory Visit (INDEPENDENT_AMBULATORY_CARE_PROVIDER_SITE_OTHER): Admitting: Family Medicine

## 2024-07-08 ENCOUNTER — Encounter: Payer: Self-pay | Admitting: Family Medicine

## 2024-07-08 ENCOUNTER — Ambulatory Visit (INDEPENDENT_AMBULATORY_CARE_PROVIDER_SITE_OTHER)

## 2024-07-08 ENCOUNTER — Ambulatory Visit (HOSPITAL_COMMUNITY)
Admission: RE | Admit: 2024-07-08 | Discharge: 2024-07-08 | Disposition: A | Discharge status: Home / Self Care | Source: Ambulatory Visit | Attending: Cardiovascular Disease | Admitting: Cardiovascular Disease

## 2024-07-08 ENCOUNTER — Ambulatory Visit: Payer: Self-pay | Admitting: Family Medicine

## 2024-07-08 VITALS — BP 138/72 | HR 98 | Temp 97.5°F | Ht 73.5 in | Wt 194.5 lb

## 2024-07-08 DIAGNOSIS — R011 Cardiac murmur, unspecified: Secondary | ICD-10-CM | POA: Insufficient documentation

## 2024-07-08 DIAGNOSIS — K5904 Chronic idiopathic constipation: Secondary | ICD-10-CM

## 2024-07-08 DIAGNOSIS — R7989 Other specified abnormal findings of blood chemistry: Secondary | ICD-10-CM | POA: Insufficient documentation

## 2024-07-08 DIAGNOSIS — R109 Unspecified abdominal pain: Secondary | ICD-10-CM | POA: Diagnosis not present

## 2024-07-08 LAB — ECHOCARDIOGRAM COMPLETE
AR max vel: 1.91 cm2
AV Area VTI: 2.26 cm2
AV Area mean vel: 1.93 cm2
AV Mean grad: 10.5 mmHg
AV Peak grad: 20.5 mmHg
Ao pk vel: 2.27 m/s
Area-P 1/2: 1.69 cm2
S' Lateral: 2.6 cm

## 2024-07-08 MED ORDER — DICYCLOMINE HCL 10 MG PO CAPS: 10.0000 mg | ORAL_CAPSULE | Freq: Three times a day (TID) | ORAL | 0 refills | Status: AC | PRN

## 2024-07-08 NOTE — Progress Notes (Signed)
 "  Subjective:     Patient ID: Clarence Ware, male    DOB: 1951-03-03, 73 y.o.   MRN: [id number]  Chief Complaint  Patient presents with   Constipation    Pt has had constipation and took medication and is now having bad stomach cramps     Discussed the use of AI scribe software for clinical note transcription with the patient, who gave verbal consent to proceed.  History of Present Illness Clarence Ware is a 73 year old male who presents with abdominal cramps and altered bowel habits.  He experiences abdominal cramps that worsen throughout the day, particularly when standing up and taking a few steps, leading to a small amount of stool being expelled. The stool is not formed and is described as pasty, similar to 'apple butter'. No watery diarrhea is present, but he mentions passing a peanut today, which he ate on Saturday.  He has a history of constipation, typically having two to three bowel movements per week. He was constipated for a week before taking Amitiza  on Sunday, followed by Senokot on Sunday night. Despite these medications, there has been no significant relief, with only minimal stool passage described as 'apple butter' consistency over the past three days. He usually takes Colace 200 mg daily and rarely uses stimulants unless necessary.  No fever or chills are present, but he reports a lack of appetite, having only eaten cheese toast for the past three days. He does not usually feel rectal pressure, but when stool oozes out, he describes his anus as 'just open'. He also reports some difficulty urinating, which he attributes to not drinking enough fluids due to the fear of cramping when standing up. His fluid intake is estimated to be at least two liters per day, consisting of water, soda, and iced tea.  He mentions a history of opioid use disorder, which he believes has contributed to his bowel issues. He has been clean for sixteen years. He also reports new onset  back pain since yesterday, despite not engaging in any physical activity.    Health Maintenance Due  Topic Date Due   COVID-19 Vaccine (5 - 2025-26 season) 03/10/2024    Past Medical History:  Diagnosis Date   Allergy    First degree heart block    Hyperlipidemia    Hypertension    REM sleep behavior disorder    Substance abuse [facility])     Past Surgical History:  Procedure Laterality Date   INGUINAL HERNIA REPAIR Right 03/10/2015   Procedure: OPEN RIGHT INGUINAL HERNIA REPAIR;  Surgeon: Donnice Lunger, MD;  Location: [facility];  Service: General;  Laterality: Right;  With MESH   VASECTOMY      Current Medications[1]  Allergies[2] ROS neg/noncontributory except as noted HPI/below      Objective:     BP 138/72   Pulse 98   Temp (!) 97.5 F (36.4 C) (Temporal)   Ht 6' 1.5 (1.867 m)   Wt 194 lb 8 oz (88.2 kg)   SpO2 98%   BMI 25.31 kg/m  Wt Readings from Last 3 Encounters:  07/08/24 194 lb 8 oz (88.2 kg)  06/19/24 190 lb (86.2 kg)  06/12/24 195 lb 12.8 oz (88.8 kg)    Physical Exam VITALS: BP- 158/72 GENERAL: Well developed, well nourished, no acute distress. HEAD EYES EARS NOSE THROAT: Normocephalic, atraumatic, conjunctiva not injected, sclera nonicteric.. ABDOMEN: Bowel sounds present, gurgling noted, soft, non-tender, non-distended, no hepatosplenomegaly, no masses. EXTREMITIES: No edema. MUSCULOSKELETAL: No  gross abnormalities. NEUROLOGICAL: Alert and oriented x3, cranial nerves II through XII intact. PSYCHIATRIC: Normal mood, good eye contact.  Cramps when stood up and started walking  Reviewed xray films.  A lot of stool.  Await overread       Assessment & Plan:  Chronic idiopathic constipation -     DG Abd 2 Views; Future  Abdominal cramping -     DG Abd 2 Views; Future  Other orders -     Dicyclomine HCl; Take 1 capsule (10 mg total) by mouth 3 (three) times daily as needed for spasms. Use prn in anticipation of a stressor that triggers  abdominal pain.  Dispense: 30 capsule; Refill: 0    Assessment and Plan Assessment & Plan Chronic idiopathic constipation  with cramping His constipation is chronic, with bowel movements occurring two to three times per week. Recent use of Amitiza  and Senokot has exacerbated symptoms, causing abdominal cramps and pasty stools. High intake of cheese toast and iced tea may also contribute. He reports no fever, chills, or significant abdominal pain beyond cramping, and no rectal pressure. While fecal impaction is considered, examination shows no definitive signs. His history as a recovering opiate addict may play a role in chronic constipation. Administer Miralax , starting with several tablespoons, with the option to increase dosage if needed. An abdominal x-ray is ordered to assess for fecal impaction. use glycerin suppositories as well. Encourage increased fluid intake. Advise an ER visit if symptoms worsen.  Already taking a lot of fiber.  Bentyl for cramps     Return if symptoms worsen or fail to improve.  Jenkins CHRISTELLA Carrel, MD     [1]  Current Outpatient Medications:    alfuzosin  (UROXATRAL ) 10 MG 24 hr tablet, TAKE 1 TABLET BY MOUTH EVERY MORNING, Disp: 90 tablet, Rfl: 1   aspirin  EC 81 MG tablet, Take 1 tablet (81 mg total) by mouth daily., Disp: 90 tablet, Rfl: 0   celecoxib  (CELEBREX ) 100 MG capsule, Take 1 capsule (100 mg total) by mouth 2 (two) times daily as needed., Disp: 90 capsule, Rfl: 1   cetirizine (ZYRTEC) 10 MG tablet, Take 10 mg by mouth as directed. PRN (Patient taking differently: Take 10 mg by mouth as needed for allergies. PRN), Disp: , Rfl:    Cholecalciferol (VITAMIN D ) 2000 units CAPS, Take 1 capsule by mouth daily., Disp: , Rfl:    clonazePAM  (KLONOPIN ) 0.5 MG tablet, TAKE A HALF TABLET BY MOUTH EVERY NIGHT AT BEDTIME AS NEEDED FOR ANXIETY, Disp: 45 tablet, Rfl: 0   dicyclomine (BENTYL) 10 MG capsule, Take 1 capsule (10 mg total) by mouth 3 (three) times daily as needed  for spasms. Use prn in anticipation of a stressor that triggers abdominal pain., Disp: 30 capsule, Rfl: 0   docusate sodium (COLACE) 50 MG capsule, Take 100 mg by mouth daily as needed for mild constipation or moderate constipation., Disp: , Rfl:    Evolocumab  (REPATHA  SURECLICK) 140 MG/ML SOAJ, Inject 140 mg into the skin every 14 (fourteen) days., Disp: 6 mL, Rfl: 3   famotidine (PEPCID) 20 MG tablet, Take 20 mg by mouth daily. (Patient taking differently: Take 20 mg by mouth 2 (two) times daily.), Disp: , Rfl:    JARDIANCE  10 MG TABS tablet, TAKE 1 TABLET BY MOUTH DAILY BEFORE BREAKFAST, Disp: 90 tablet, Rfl: 0   MYRBETRIQ 50 MG TB24 tablet, Take 50 mg by mouth daily., Disp: , Rfl:    nitroGLYCERIN  (NITROSTAT ) 0.4 MG SL tablet, Place 1  tablet (0.4 mg total) under the tongue every 5 (five) minutes as needed for chest pain., Disp: 50 tablet, Rfl: 3   rosuvastatin  (CRESTOR ) 20 MG tablet, Take 1 tablet (20 mg total) by mouth daily., Disp: 90 tablet, Rfl: 3 [2]  Allergies Allergen Reactions   Nexium [Esomeprazole Magnesium]     Awful regurgitation   "

## 2024-07-08 NOTE — Patient Instructions (Signed)
 Miralax   Glycerine suppositories.

## 2024-07-09 ENCOUNTER — Ambulatory Visit (INDEPENDENT_AMBULATORY_CARE_PROVIDER_SITE_OTHER)

## 2024-07-11 ENCOUNTER — Ambulatory Visit: Payer: Self-pay

## 2024-07-11 NOTE — Telephone Encounter (Signed)
 FYI Only or Action Required?: FYI only for provider: appointment scheduled on 07/14/24.  Patient was last seen in primary care on 07/08/2024 by Wendolyn Jenkins Jansky, MD.  Called Nurse Triage reporting Constipation.  Symptoms began several days ago.  Interventions attempted: Prescription medications: glycerin suppositories, miralax , leftover sennakot.  Symptoms are: gradually improving.  Triage Disposition: See PCP When Office is Open (Within 3 Days)  Patient/caregiver understands and will follow disposition?: Yes Copied from CRM #[id number]. Topic: Clinical - Red Word Triage >> Jul 11, 2024  9:58 AM Antwanette L wrote: Red Word that prompted transfer to Nurse Triage: Patient reports abdominal discomfort(pain) along with hard, dry stools and straining during bowel movements Reason for Disposition  Unable to have a bowel movement (BM) without manually removing stool (using finger to pull out stool or perform disimpaction)  Answer Assessment - Initial Assessment Questions 1. STOOL PATTERN OR FREQUENCY: When was your last BM?       Wednesday (after taking glycerin suppositories and mirilax following a PCP visit)  2. STRAINING: Do you have to strain to have a BM?      Yes  3. ONSET: When did the constipation begin?     X Sunday  4. RECTAL PAIN: Does your rectum hurt when the stool comes out?    Pt denies  5. BM COMPOSITION: Are the stools hard?      Pt says he has not had a BM since Wednesday so he is unable to say.  6. BLOOD ON STOOLS: Has there been any blood on the toilet tissue or on the surface of the BM? If Yes, ask: When was the last time?     Bright red blood when he wipes, a smear of it.   7. CHRONIC CONSTIPATION: Is this a new problem for you?  If No, ask: How long have you had this problem? (days, weeks, months)      Since Sunday.    9. MEDICINES: Have you been taking any new medicines? Are you taking any narcotic pain medicines? (e.g., Dilaudid,  morphine, Percocet, Vicodin)     Denies  10. LAXATIVES: Have you been using any stool softeners, laxatives, or enemas?  If Yes, ask What are you using, how often, and when was the last time?       Glycerin suppository today, took Miralax  several times yesterday with no relief  14. OTHER SYMPTOMS: Do you have any other symptoms? (e.g., abdomen pain, bloating, fever, vomiting)       Denies. The cramping he'd had Sunday has completely resolved now, he is just trying to have a BM.  Protocols used: Constipation-A-AH

## 2024-07-14 ENCOUNTER — Ambulatory Visit: Admitting: Family Medicine

## 2024-07-14 ENCOUNTER — Encounter: Payer: Self-pay | Admitting: Family Medicine

## 2024-07-14 ENCOUNTER — Ambulatory Visit: Attending: Neurology

## 2024-07-14 VITALS — BP 132/80 | HR 70 | Temp 98.0°F | Ht 73.5 in | Wt 196.0 lb

## 2024-07-14 DIAGNOSIS — R197 Diarrhea, unspecified: Secondary | ICD-10-CM

## 2024-07-14 DIAGNOSIS — R471 Dysarthria and anarthria: Secondary | ICD-10-CM | POA: Diagnosis present

## 2024-07-14 DIAGNOSIS — R498 Other voice and resonance disorders: Secondary | ICD-10-CM | POA: Insufficient documentation

## 2024-07-14 DIAGNOSIS — K5904 Chronic idiopathic constipation: Secondary | ICD-10-CM | POA: Diagnosis not present

## 2024-07-14 LAB — LIPID PANEL
Chol/HDL Ratio: 2.1 ratio (ref 0.0–5.0)
Cholesterol, Total: 109 mg/dL (ref 100–199)
HDL: 51 mg/dL
LDL Chol Calc (NIH): 44 mg/dL (ref 0–99)
Triglycerides: 64 mg/dL (ref 0–149)
VLDL Cholesterol Cal: 14 mg/dL (ref 5–40)

## 2024-07-14 NOTE — Therapy (Signed)
 " OUTPATIENT SPEECH LANGUAGE PATHOLOGY TREATMENT-RECERTIFICATION   Patient Name: Clarence Ware MRN: [id number] DOB:1951-05-25, 74 y.o., male Today's Date: 07/14/2024  PCP: Joshua Ned, MD REFERRING PROVIDER: Dohmeier, Dedra, MD  END OF SESSION:  End of Session - 07/14/24 0934     Visit Number 7    Number of Visits 9    Date for Recertification  08/15/24    SLP Start Time 0852    SLP Stop Time  0928    SLP Time Calculation (min) 36 min    Activity Tolerance Patient tolerated treatment well              Past Medical History:  Diagnosis Date   Allergy    First degree heart block    Hyperlipidemia    Hypertension    REM sleep behavior disorder    Substance abuse [facility])    Past Surgical History:  Procedure Laterality Date   INGUINAL HERNIA REPAIR Right 03/10/2015   Procedure: OPEN RIGHT INGUINAL HERNIA REPAIR;  Surgeon: Donnice Lunger, MD;  Location: [facility];  Service: General;  Laterality: Right;  With MESH   VASECTOMY     Patient Active Problem List   Diagnosis Date Noted   Murmur, cardiac 05/17/2024   Elevated brain natriuretic peptide (BNP) level 05/17/2024   Stable angina 05/17/2024   Bradycardia 05/16/2024   Symptomatic bradycardia 05/16/2024   Chest tightness 05/16/2024   Impacted cerumen of right ear 04/18/2024   Degenerative tear of medial meniscus of right knee 04/11/2024   Diabetes mellitus due to underlying condition with diabetic neuropathy, without long-term current use of insulin (HCC) 04/03/2024   Chronic idiopathic constipation 04/02/2024   Need for immunization against influenza 04/02/2024   Encounter for general adult medical examination with abnormal findings 04/02/2024   Herniated lumbar disc without myelopathy 12/07/2023   High serum lipoprotein(a) 10/11/2023   BPH associated with nocturia 04/04/2023   Chronic heart failure with preserved ejection fraction (HFpEF) (HCC) 10/14/2022   Need for vaccination 09/12/2022   Sleep related  bruxism 08/24/2022   UARS (upper airway resistance syndrome) 08/24/2022   Primary osteoarthritis of both knees 12/13/2021   Cerebellar ataxia in diseases classified elsewhere (HCC) 09/01/2021   Seborrheic keratosis 09/01/2021   BPH with urinary obstruction 09/01/2021   Age-related cognitive decline 08/05/2020   Uncontrolled REM sleep behavior disorder 03/16/2020   Essential hypertension 12/18/2014   Hyperlipidemia with target LDL less than 70 12/18/2014   First degree heart block 03/30/2013   GERD 06/26/2008   ERECTILE DYSFUNCTION, MILD 01/30/2008   COLON POLYP 09/06/2006    Speech Therapy Progress Note  Dates of Reporting Period: 04/28/24 to present  Subjective Statement: Pt has been seen for 7 visits ST to target speech volume.  Objective: Pt's loudness has improved over the course of treatment, he has noticed when he doesn't practice it is more difficult to stay loud in conversation. Pt requires more ST visits.  Goal Update: See below.   Plan: See pt for 1-2 more visits.   Reason Skilled Services are Required: Ensure progress over time.    Onset date: 6 months   REFERRING DIAG: R49.0 (ICD-10-CM) - Dysphonia R43.0 (ICD-10-CM) - Anosmia G47.52 (ICD-10-CM) - Sleep behavior disorder, REM G20.A1 (ICD-10-CM) - Parkinson's disease, unspecified whether dyskinesia present, unspecified whether manifestations fluctuate (HCC)  THERAPY DIAG:  Dysarthria and anarthria - Plan: SLP plan of care cert/re-cert  Other voice and resonance disorders  Rationale for Evaluation and Treatment: Rehabilitation  SUBJECTIVE:   SUBJECTIVE STATEMENT:  I  had a GI issue last week so I wasn't as good with my practicing. I could tell I was softer.  Pt accompanied by: self  PERTINENT HISTORY: Recently pt has had more difficutly with softer speech. Has new hearing aids as of last week.   PAIN:  Are you having pain? No  FALLS: Has patient fallen in last 6 months? Yes, Number of falls: 1  LIVING  ENVIRONMENT: Lives with: lives with their spouse Lives in: House/apartment  PLOF:Level of assistance: Independent with ADLs, Independent with IADLs Employment: Retired  PATIENT GOALS: Speak so I can be heard.  OBJECTIVE:  Note: Objective measures were completed at Evaluation unless otherwise noted.   PATIENT REPORTED OUTCOME MEASURES (PROM): Communication Effectiveness Survey: provided 05/1724; pt scored himself 25/32 with higher scores indicating less of an impact on pt's QoL. Lowest score was 2 for being a part of a conversation in a noisy environment.                                                                                                                            TREATMENT DATE:   07/14/24: PhoRTE completed with rare min A (glides for numbers, and not a gruff voice for lower-pitched sentences). Loudness averages today: /a/ 88dB, numbers 86dB, high pitched sentences 86dB, low-pitched sentences 87dB. In 15 minutes conversation. In 15-minute conversation pt maintained WNL volume.   06/30/24: SLP needs to assess pt's performance with PhoRTE next session. [name] maintained WNL volume in conversational segments of 2-4 minutes, and over 9 minutes today in simple conversation but after that decr'd to just below WNL average until SLP cued pt. With pt's s statement, SLP stressed use of external cue for maintaining WNL volume over time. Pt will utilize this strategy and let SLP know how it is going to reassess PRN.  Every other week ST was agreed was best at this point, with possibility of decr to once/4 weeks after next session.  06/23/24: Per ENT vocal fold atrophy present. 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 SLP told pt to complete PhoRTE for 8 weeks (until Valentie's Day) and then consider whether he wants to go forward with vocal fold injections. SLP encouraged pt to have an external cue to remind  him to speak with intent. SLP targeted louder speech in conversation for 10 minutes and pt maintained WNL volume 90% of the time.   06/09/24: PT's ENT eval is today. [name] states that he is definitely more aware of softer speech than he was prior to ST. He has not practiced with voice exercises since last session giving the holidays and company as the reason. He tells SLP he is getting more used to speaking louder and has some cues from family to speak louder. Pt spoke initially for 7 minutes without notable decrease in loudness, the next portion of conversation was 13 minutes and was also average WNL volume. SLP and  pt went outdoors and pt maintained intelligible speech with distant playground noise, and vehicle noise. SLP explained rationale for PhoRTE exercises should pt require these after ENT eval.  05/26/24: Pt practiced about 3 days since last session. I can tell when I'm not loud enough. Pt states when he knows he is not shouting then he is not speaking loudly enough. Pt's speech in first 9 minutes of conversation was WNL.Next 10 minutes was average low-WNL. In next 8 minutes pt told SLP he knew he was louder and he was just that - average 71dB. SLP and pt walked to the front of the building, outside, and SLP judged pt's loudness which remained WNL. Pt filled out CES, with results above. [name] reported today that after 8-10 minutes of conversation he is dyspneic. SLP told [name] to give that info to ENT.  05/19/24: SLP guided pt through practice with more intent and purpose when speaking in order to incr pt's habitual speech volume. PT had focused on more intent with speech for approx 80% in first 3 mintues conversation in the speech room, average 70dB. He read paragraphs with average 71dB, SLP used auditory cues to prove to pt he was not shouting. In sentence responses with some restrictions (sentences with phrases and two-word combinations) pt used average WNL volume. In multisentence responses pt had  WNL volume approx 85% of the time; Self awareness of decr'd volume improved over the session. In monologues of approx one minute, pt maintained WNL volume average 75% of the time. SLP provided homework for pt and told pt to complete 5 days/week for 20 minutes, or 4 days/week for 2 15-minute practice periods. Some but minimal carryover notable to conversation following practice today.  04/28/24: SLP guided pt through diagnostic therapy session where [name] spoke in  monologues with average 72dB, within normal limits volume. SLP talked with pt about the therapy course. Pt preferred x1/week and SLP preferred x1-2/week; agreed on x1/week. SLP Explained to pt rationale for ENT evaluation.    PATIENT EDUCATION: Education details: see Treatment date above Person educated: Patient Education method: Explanation Education comprehension: verbalized understanding and needs further education  HOME EXERCISE PROGRAM: Practice louder speech  GOALS: Goals reviewed with patient? No  SHORT TERM GOALS: Target date: 06/06/24  Pt will demo average WNL volume with 18/20 sentences Baseline: Goal status: met  2.  Pt will practice home tasks 4/7 days/week for 2 weeks Baseline:  Goal status: not met   LONG TERM GOALS: Target date: , 08/18/24  Pt will improve PROM Baseline:  Goal status: INITIAL  2.  Pt will report wife does not ask him to repeat at the frequency she was prior to ST Baseline:  Goal status: met  3.  [name] will produce average WNL speech volume in 10 minutes conversation in 2 sessions Baseline: 06/23/24 Goal status: met  4.  Pt will complete PhoRTE with mod I in 2 sessions Baseline:  Goal status: INITIAL   ASSESSMENT:  CLINICAL IMPRESSION: RECERT TODAY. Patient is a 75 y.o. M who was seen today for treatment of voice volume due to wife and others telling him he has a softer voice than he used to have. 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 See treatment date above for today's date for further details on today's session. Pt is in a yearly REM behavior sleep disorder study in [place] each year, and PD is a diagnosis that is possible for  him at this time, but no formal diagnosis has been made.  Based upon diagnostic therapy today pt would be a good candidate for skilled ST.   OBJECTIVE IMPAIRMENTS: include voice disorder. These impairments are limiting patient from household responsibilities, ADLs/IADLs, and effectively communicating at home and in community. Factors affecting potential to achieve goals and functional outcome are none noted.. Patient will benefit from skilled SLP services to address above impairments and improve overall function.  REHAB POTENTIAL: Good  PLAN:  SLP FREQUENCY: approx once/month   SLP DURATION: 9 sessions total  PLANNED INTERVENTIONS: Internal/external aids, Oral motor exercises, Functional tasks, SLP instruction and feedback, Compensatory strategies, Patient/family education, and [id number] Treatment of speech (30 or 45 min)     Regenia Erck, CCC-SLP 07/14/2024, 9:40 AM      "

## 2024-07-14 NOTE — Patient Instructions (Addendum)
 [phone]  1 cap of miralax  daily and adjust as needed  Butt exercises

## 2024-07-14 NOTE — Progress Notes (Signed)
 "  Subjective:     Patient ID: Clarence Ware, male    DOB: Nov 08, 1950, 74 y.o.   MRN: [id number]  Chief Complaint  Patient presents with   Constipation    Has gotten better but not resolved.    Discussed the use of AI scribe software for clinical note transcription with the patient, who gave verbal consent to proceed.  History of Present Illness Clarence Ware is a 74 year old male with chronic constipation who presents with ongoing diarrhea and bowel movement irregularities.  He has been experiencing watery diarrhea since his last visit, following a significant bowel movement after using Miralax  and glycerin suppositories. Every time he urinates, he also has a bowel movement, which he describes as 'watery diarrhea'.  He has been taking Miralax  for four days, multiple times a day, but has not achieved the desired bowel movement consistency. He stopped taking Miralax  today. He no longer experiences cramps, which resolved after taking dicyclomine  for two days following the initial large bowel movement.  He has a history of chronic constipation and mentions that he has always had trouble with it. He recalls having a colonoscopy ten years ago. This episode of bowel irregularity has lasted longer than usual.  His weight has been stable at home, fluctuating between 188 and 192 pounds, and he attributes recent weight changes to wearing different clothing during weigh-ins. He is currently on a diet with his wife, which may lead to intentional weight loss.  He discusses a rash on his hands, which he has been treating with Eucerin cream and steroid cream as needed. He attributes the rash to frequent hand washing and use of hand sanitizer.    There are no preventive care reminders to display for this patient.  Past Medical History:  Diagnosis Date   Allergy    First degree heart block    Hyperlipidemia    Hypertension    REM sleep behavior disorder    Substance abuse [facility])      Past Surgical History:  Procedure Laterality Date   INGUINAL HERNIA REPAIR Right 03/10/2015   Procedure: OPEN RIGHT INGUINAL HERNIA REPAIR;  Surgeon: Donnice Lunger, MD;  Location: [facility];  Service: General;  Laterality: Right;  With MESH   VASECTOMY      Current Medications[1]  Allergies[2] ROS neg/noncontributory except as noted HPI/below      Objective:     BP 132/80   Pulse 70   Temp 98 F (36.7 C) (Temporal)   Ht 6' 1.5 (1.867 m)   Wt 196 lb (88.9 kg)   SpO2 98%   BMI 25.51 kg/m  Wt Readings from Last 3 Encounters:  07/14/24 196 lb (88.9 kg)  07/08/24 194 lb 8 oz (88.2 kg)  06/19/24 190 lb (86.2 kg)    Physical Exam MEASUREMENTS: Weight- 196. GENERAL: Well developed, well nourished, no acute distress. HEAD EYES EARS NOSE THROAT: Normocephalic, atraumatic, conjunctiva not injected, sclera nonicteric. ABDOMEN: Active bowel sounds, soft, non-tender, non-distended, no hepatosplenomegaly, no masses. EXTREMITIES: No edema. MUSCULOSKELETAL: No gross abnormalities. NEUROLOGICAL: Alert and oriented x3, cranial nerves II through XII intact. PSYCHIATRIC: Normal mood, good eye contact. SKIN: Rash on hands.       Assessment & Plan:  Chronic idiopathic constipation -     Ambulatory referral to Gastroenterology  Diarrhea, unspecified type -     Ambulatory referral to Gastroenterology    Assessment and Plan Assessment & Plan Chronic idiopathic constipation and diarrhea   He recently experienced an exacerbation of  constipation followed by diarrhea. The initial constipation resolved with Miralax  and glycerin suppositories. Current diarrhea is likely due to Miralax  overuse. He reports no cramps or significant abdominal pain, and a recent x-ray showed no bowel obstruction but constipation. A colonoscopy is due end of year, as the last one was performed 10 years ago. Start Miralax  one capful daily for chronic constipation and adjust the dose based on bowel movements. He  is referred to GI for further evaluation and consideration of necessity of colonoscopy. Perform Kegel exercises to strengthen the sphincter muscle. .  Hand dermatitis   Frequent hand washing and use of hand sanitizer likely caused his hand dermatitis. Initial treatment with Eucerin cream was insufficient, but steroid cream provided relief. Symptoms improved with continued use of Eucerin and protective measures. Continue using Eucerin cream regularly and use steroid cream as needed for flare-ups. Wear gloves to protect hands from irritants.     Return if symptoms worsen or fail to improve.  Jenkins CHRISTELLA Carrel, MD     [1]  Current Outpatient Medications:    alfuzosin  (UROXATRAL ) 10 MG 24 hr tablet, TAKE 1 TABLET BY MOUTH EVERY MORNING, Disp: 90 tablet, Rfl: 1   aspirin  EC 81 MG tablet, Take 1 tablet (81 mg total) by mouth daily., Disp: 90 tablet, Rfl: 0   celecoxib  (CELEBREX ) 100 MG capsule, Take 1 capsule (100 mg total) by mouth 2 (two) times daily as needed., Disp: 90 capsule, Rfl: 1   cetirizine (ZYRTEC) 10 MG tablet, Take 10 mg by mouth as directed. PRN (Patient taking differently: Take 10 mg by mouth as needed for allergies. PRN), Disp: , Rfl:    Cholecalciferol (VITAMIN D ) 2000 units CAPS, Take 1 capsule by mouth daily., Disp: , Rfl:    clonazePAM  (KLONOPIN ) 0.5 MG tablet, TAKE A HALF TABLET BY MOUTH EVERY NIGHT AT BEDTIME AS NEEDED FOR ANXIETY, Disp: 45 tablet, Rfl: 0   docusate sodium (COLACE) 50 MG capsule, Take 100 mg by mouth daily as needed for mild constipation or moderate constipation., Disp: , Rfl:    Evolocumab  (REPATHA  SURECLICK) 140 MG/ML SOAJ, Inject 140 mg into the skin every 14 (fourteen) days., Disp: 6 mL, Rfl: 3   famotidine (PEPCID) 20 MG tablet, Take 20 mg by mouth daily. (Patient taking differently: Take 20 mg by mouth 2 (two) times daily.), Disp: , Rfl:    JARDIANCE  10 MG TABS tablet, TAKE 1 TABLET BY MOUTH DAILY BEFORE BREAKFAST, Disp: 90 tablet, Rfl: 0   MYRBETRIQ 50 MG  TB24 tablet, Take 50 mg by mouth daily., Disp: , Rfl:    nitroGLYCERIN  (NITROSTAT ) 0.4 MG SL tablet, Place 1 tablet (0.4 mg total) under the tongue every 5 (five) minutes as needed for chest pain., Disp: 50 tablet, Rfl: 3   rosuvastatin  (CRESTOR ) 20 MG tablet, Take 1 tablet (20 mg total) by mouth daily., Disp: 90 tablet, Rfl: 3   dicyclomine  (BENTYL ) 10 MG capsule, Take 1 capsule (10 mg total) by mouth 3 (three) times daily as needed for spasms. Use prn in anticipation of a stressor that triggers abdominal pain., Disp: 30 capsule, Rfl: 0 [2]  Allergies Allergen Reactions   Nexium [Esomeprazole Magnesium]     Awful regurgitation   "

## 2024-07-17 NOTE — Progress Notes (Signed)
 " Clarence Ware [address] [place] [address] Phone: [phone] Subjective:   Clarence Ware am a scribe for Dr. Claudene.   I'm seeing this patient by the request  of:  Joshua Debby CROME, MD  CC: right knee pain   YEP:Dlagzrupcz  05/23/2024 Improvement noted but still some displacement noted.  We discussed with patient about icing regimen and home exercises, increase activity slowly.  Follow-up again in 2 months.  If any worsening pain or instability we will need to consider the possibility advanced imaging.      Update 07/18/2024 Clarence Ware is a 74 y.o. male coming in with complaint of R knee pain. Patient states that has been hurting for a couple months since the first visit. It is about the same.        Past Medical History:  Diagnosis Date   Allergy    First degree heart block    Hyperlipidemia    Hypertension    REM sleep behavior disorder    Substance abuse [facility])    Past Surgical History:  Procedure Laterality Date   INGUINAL HERNIA REPAIR Right 03/10/2015   Procedure: OPEN RIGHT INGUINAL HERNIA REPAIR;  Surgeon: Donnice Lunger, MD;  Location: [facility];  Service: General;  Laterality: Right;  With MESH   VASECTOMY     Social History   Socioeconomic History   Marital status: Married    Spouse name: [name]   Number of children: 2   Years of education: Not on file   Highest education level: Bachelor's degree (e.g., BA, AB, BS)  Occupational History   Occupation: Pharmacist part-time  Tobacco Use   Smoking status: Former    Current packs/day: 0.00    Average packs/day: 0.3 packs/day for 1 year (0.3 ttl pk-yrs)    Types: Cigarettes    Start date: 07/11/1991    Quit date: 07/10/1992    Years since quitting: 32.0   Smokeless tobacco: Never  Vaping Use   Vaping status: Never Used  Substance and Sexual Activity   Alcohol use: No    Alcohol/week: 0.0 standard drinks of alcohol   Drug use: Not Currently    Comment: sobrity  for 10 years   Sexual activity: Yes  Other Topics Concern   Not on file  Social History Narrative   Married. Education: [employer].   Exercise: some   Caffeine- coffee 2 c daily      Lives with wife.   Social Drivers of Health   Tobacco Use: Medium Risk (07/14/2024)   Patient History    Smoking Tobacco Use: Former    Smokeless Tobacco Use: Never    Passive Exposure: Not on file  Financial Resource Strain: Low Risk (06/08/2024)   Overall Financial Resource Strain (CARDIA)    Difficulty of Paying Living Expenses: Not hard at all  Food Insecurity: No Food Insecurity (06/19/2024)   Epic    Worried About Programme Researcher, Broadcasting/film/video in the Last Year: Never true    Ran Out of Food in the Last Year: Never true  Transportation Needs: No Transportation Needs (06/19/2024)   Epic    Lack of Transportation (Medical): No    Lack of Transportation (Non-Medical): No  Physical Activity: Sufficiently Active (06/19/2024)   Exercise Vital Sign    Days of Exercise per Week: 6 days    Minutes of Exercise per Session: 30 min  Stress: No Stress Concern Present (06/19/2024)   [employer] of Occupational Health - Occupational Stress  Questionnaire    Feeling of Stress: Not at all  Social Connections: Moderately Integrated (06/19/2024)   Social Connection and Isolation Panel    Frequency of Communication with Friends and Family: More than three times a week    Frequency of Social Gatherings with Friends and Family: More than three times a week    Attends Religious Services: Never    Database Administrator or Organizations: Yes    Attends Banker Meetings: 1 to 4 times per year    Marital Status: Married  Depression (PHQ2-9): Low Risk (06/19/2024)   Depression (PHQ2-9)    PHQ-2 Score: 0  Alcohol Screen: Not on file  Housing: Unknown (06/19/2024)   Epic    Unable to Pay for Housing in the Last Year: No    Number of Times Moved in the Last Year: Not on file    Homeless in the Last Year: No   Utilities: Not At Risk (06/19/2024)   Epic    Threatened with loss of utilities: No  Health Literacy: Adequate Health Literacy (06/19/2024)   B1300 Health Literacy    Frequency of need for help with medical instructions: Never   Allergies[1] Family History  Problem Relation Age of Onset   Heart disease Mother    Stroke Mother    Heart disease Father 30       MI   Hypertension Father    Hypertension Brother    Cancer Brother    Diabetes Brother    Heart disease Brother    Hyperlipidemia Brother    Cancer Brother    Hyperlipidemia Sister    Coronary artery disease Brother 24       CABG   Colon cancer Paternal Grandmother     Current Outpatient Medications (Endocrine & Metabolic):    JARDIANCE  10 MG TABS tablet, TAKE 1 TABLET BY MOUTH DAILY BEFORE BREAKFAST  Current Outpatient Medications (Cardiovascular):    Evolocumab  (REPATHA  SURECLICK) 140 MG/ML SOAJ, Inject 140 mg into the skin every 14 (fourteen) days.   nitroGLYCERIN  (NITROSTAT ) 0.4 MG SL tablet, Place 1 tablet (0.4 mg total) under the tongue every 5 (five) minutes as needed for chest pain.   rosuvastatin  (CRESTOR ) 20 MG tablet, Take 1 tablet (20 mg total) by mouth daily.  Current Outpatient Medications (Respiratory):    cetirizine (ZYRTEC) 10 MG tablet, Take 10 mg by mouth as directed. PRN (Patient taking differently: Take 10 mg by mouth as needed for allergies. PRN)  Current Outpatient Medications (Analgesics):    aspirin  EC 81 MG tablet, Take 1 tablet (81 mg total) by mouth daily.   celecoxib  (CELEBREX ) 100 MG capsule, Take 1 capsule (100 mg total) by mouth 2 (two) times daily as needed.  Current Outpatient Medications (Other):    alfuzosin  (UROXATRAL ) 10 MG 24 hr tablet, TAKE 1 TABLET BY MOUTH EVERY MORNING   Cholecalciferol (VITAMIN D ) 2000 units CAPS, Take 1 capsule by mouth daily.   clonazePAM  (KLONOPIN ) 0.5 MG tablet, TAKE A HALF TABLET BY MOUTH EVERY NIGHT AT BEDTIME AS NEEDED FOR ANXIETY   docusate sodium  (COLACE) 50 MG capsule, Take 100 mg by mouth daily as needed for mild constipation or moderate constipation.   famotidine (PEPCID) 20 MG tablet, Take 20 mg by mouth daily. (Patient taking differently: Take 20 mg by mouth 2 (two) times daily.)   MYRBETRIQ 50 MG TB24 tablet, Take 50 mg by mouth daily.   dicyclomine  (BENTYL ) 10 MG capsule, Take 1 capsule (10 mg total) by mouth 3 (  three) times daily as needed for spasms. Use prn in anticipation of a stressor that triggers abdominal pain.   Reviewed prior external information including notes and imaging from  primary care provider As well as notes that were available from care everywhere and other healthcare systems.  Past medical history, social, surgical and family history all reviewed in electronic medical record.  No pertanent information unless stated regarding to the chief complaint.   Review of Systems:  No headache, visual changes, nausea, vomiting, diarrhea, constipation, dizziness, abdominal pain, skin rash, fevers, chills, night sweats, weight loss, swollen lymph nodes, body aches, joint swelling, chest pain, shortness of breath, mood changes. POSITIVE muscle aches  Objective  Blood pressure 130/70, pulse 64, height 6' 1.5 (1.867 m), weight 195 lb 3.2 oz (88.5 kg).   General: No apparent distress alert and oriented x3 mood and affect normal, dressed appropriately.  HEENT: Pupils equal, extraocular movements intact  Respiratory: Patient's speak in full sentences and does not appear short of breath  Cardiovascular: No lower extremity edema, non tender, no erythema  Right knee exam shows some mild swelling noted.  Tenderness to palpation over the medial joint space.  Positive McMurray's noted.  Lacks the last 10 degrees of flexion which is worse than usual.  Limited muscular skeletal ultrasound was performed and interpreted by CLAUDENE HUSSAR, M  Limited ultrasound shows that patient does have more displacement of the medial meniscus than  what was previously seen.  Tear still noted at this time.  Some mild scar tissue formation noted.  More narrowing noted of the medial joint space.    Impression and Recommendations:     The above documentation has been reviewed and is accurate and complete Hussar CHRISTELLA Claudene, DO       [1]  Allergies Allergen Reactions   Nexium [Esomeprazole Magnesium]     Awful regurgitation   "

## 2024-07-18 ENCOUNTER — Ambulatory Visit: Admitting: Family Medicine

## 2024-07-18 ENCOUNTER — Other Ambulatory Visit: Payer: Self-pay

## 2024-07-18 ENCOUNTER — Encounter: Payer: Self-pay | Admitting: Family Medicine

## 2024-07-18 VITALS — BP 130/70 | HR 64 | Ht 73.5 in | Wt 195.2 lb

## 2024-07-18 DIAGNOSIS — M25561 Pain in right knee: Secondary | ICD-10-CM

## 2024-07-18 DIAGNOSIS — M23203 Derangement of unspecified medial meniscus due to old tear or injury, right knee: Secondary | ICD-10-CM | POA: Diagnosis not present

## 2024-07-18 DIAGNOSIS — G8929 Other chronic pain: Secondary | ICD-10-CM

## 2024-07-18 NOTE — Patient Instructions (Signed)
 Good to see you. MRI of the right knee. We will be in contact via mychart.

## 2024-07-18 NOTE — Assessment & Plan Note (Signed)
 Patient unfortunately does have worsening displacement of the medial meniscus is not noted.  Does have the underlying arthritis.  Patient has failed all conservative therapy including home exercises, bracing, anti-inflammatories.  Will order MRI to further evaluate.  Patient would consider surgical intervention to allow him to be more active if needed.  Follow-up again in 6 to 12 weeks.

## 2024-07-28 ENCOUNTER — Ambulatory Visit

## 2024-07-30 ENCOUNTER — Ambulatory Visit: Payer: Self-pay | Admitting: Internal Medicine

## 2024-07-31 ENCOUNTER — Encounter: Payer: Self-pay | Admitting: Gastroenterology

## 2024-08-02 ENCOUNTER — Ambulatory Visit
Admission: RE | Admit: 2024-08-02 | Discharge: 2024-08-02 | Disposition: A | Discharge status: Home / Self Care | Source: Ambulatory Visit | Attending: Family Medicine | Admitting: Family Medicine

## 2024-08-02 DIAGNOSIS — G8929 Other chronic pain: Secondary | ICD-10-CM

## 2024-08-04 ENCOUNTER — Ambulatory Visit: Payer: Self-pay | Admitting: Family Medicine

## 2024-08-11 ENCOUNTER — Ambulatory Visit

## 2024-08-26 ENCOUNTER — Ambulatory Visit: Admitting: Gastroenterology

## 2024-08-26 ENCOUNTER — Ambulatory Visit

## 2024-09-01 ENCOUNTER — Ambulatory Visit: Admitting: Orthopaedic Surgery

## 2024-09-30 ENCOUNTER — Ambulatory Visit: Admitting: Internal Medicine

## 2024-12-08 ENCOUNTER — Ambulatory Visit (INDEPENDENT_AMBULATORY_CARE_PROVIDER_SITE_OTHER)

## 2025-04-09 ENCOUNTER — Ambulatory Visit: Admitting: Neurology

## 2025-06-25 ENCOUNTER — Ambulatory Visit

## 2025-06-25 ENCOUNTER — Encounter: Admitting: Internal Medicine
